# Patient Record
Sex: Male | Born: 1955
Health system: Southern US, Community
[De-identification: ages and names within clinical notes are randomized; demographics above are authoritative.]

## PROBLEM LIST (undated history)

## (undated) DIAGNOSIS — I10 Essential (primary) hypertension: Secondary | ICD-10-CM

## (undated) DIAGNOSIS — M1990 Unspecified osteoarthritis, unspecified site: Secondary | ICD-10-CM

## (undated) DIAGNOSIS — E669 Obesity, unspecified: Secondary | ICD-10-CM

## (undated) DIAGNOSIS — R519 Headache, unspecified: Secondary | ICD-10-CM

## (undated) DIAGNOSIS — D696 Thrombocytopenia, unspecified: Secondary | ICD-10-CM

## (undated) DIAGNOSIS — R011 Cardiac murmur, unspecified: Secondary | ICD-10-CM

## (undated) DIAGNOSIS — F1020 Alcohol dependence, uncomplicated: Secondary | ICD-10-CM

## (undated) DIAGNOSIS — Z720 Tobacco use: Secondary | ICD-10-CM

## (undated) DIAGNOSIS — I2699 Other pulmonary embolism without acute cor pulmonale: Secondary | ICD-10-CM

## (undated) DIAGNOSIS — K2970 Gastritis, unspecified, without bleeding: Secondary | ICD-10-CM

## (undated) DIAGNOSIS — D649 Anemia, unspecified: Secondary | ICD-10-CM

## (undated) DIAGNOSIS — J449 Chronic obstructive pulmonary disease, unspecified: Secondary | ICD-10-CM

## (undated) DIAGNOSIS — K209 Esophagitis, unspecified: Secondary | ICD-10-CM

## (undated) DIAGNOSIS — I209 Angina pectoris, unspecified: Secondary | ICD-10-CM

## (undated) DIAGNOSIS — G4733 Obstructive sleep apnea (adult) (pediatric): Secondary | ICD-10-CM

## (undated) DIAGNOSIS — R51 Headache: Secondary | ICD-10-CM

## (undated) HISTORY — PX: UPPER GI ENDOSCOPY: SHX6162

## (undated) HISTORY — PX: JOINT REPLACEMENT: SHX530

## (undated) HISTORY — DX: Angina pectoris, unspecified: I20.9

## (undated) HISTORY — PX: TONSILLECTOMY: SUR1361

## (undated) HISTORY — DX: Tobacco use: Z72.0

## (undated) HISTORY — DX: Obesity, unspecified: E66.9

---

## 2008-09-10 ENCOUNTER — Ambulatory Visit: Payer: Self-pay | Admitting: Internal Medicine

## 2008-09-10 DIAGNOSIS — G4733 Obstructive sleep apnea (adult) (pediatric): Secondary | ICD-10-CM | POA: Insufficient documentation

## 2008-09-10 DIAGNOSIS — I10 Essential (primary) hypertension: Secondary | ICD-10-CM | POA: Insufficient documentation

## 2008-09-10 DIAGNOSIS — F17201 Nicotine dependence, unspecified, in remission: Secondary | ICD-10-CM | POA: Insufficient documentation

## 2008-09-22 ENCOUNTER — Ambulatory Visit: Payer: Self-pay | Admitting: Internal Medicine

## 2008-10-06 ENCOUNTER — Encounter: Payer: Self-pay | Admitting: Internal Medicine

## 2008-10-06 ENCOUNTER — Ambulatory Visit (HOSPITAL_BASED_OUTPATIENT_CLINIC_OR_DEPARTMENT_OTHER): Admission: RE | Admit: 2008-10-06 | Discharge: 2008-10-06 | Payer: Self-pay | Admitting: Internal Medicine

## 2008-10-09 ENCOUNTER — Ambulatory Visit: Payer: Self-pay | Admitting: Internal Medicine

## 2008-10-14 ENCOUNTER — Ambulatory Visit: Payer: Self-pay | Admitting: Internal Medicine

## 2008-10-20 ENCOUNTER — Encounter: Payer: Self-pay | Admitting: Internal Medicine

## 2008-11-26 ENCOUNTER — Ambulatory Visit: Payer: Self-pay | Admitting: Internal Medicine

## 2008-12-24 HISTORY — PX: COLONOSCOPY W/ BIOPSIES AND POLYPECTOMY: SHX1376

## 2009-03-27 ENCOUNTER — Encounter: Payer: Self-pay | Admitting: Internal Medicine

## 2010-08-23 ENCOUNTER — Ambulatory Visit: Payer: Self-pay | Admitting: Internal Medicine

## 2010-08-29 LAB — CBC & DIFF AND RETIC
Basophils Absolute: 0 10*3/uL (ref 0.0–0.1)
EOS%: 1.6 % (ref 0.0–7.0)
Eosinophils Absolute: 0.1 10*3/uL (ref 0.0–0.5)
HCT: 37.6 % — ABNORMAL LOW (ref 38.4–49.9)
HGB: 12.3 g/dL — ABNORMAL LOW (ref 13.0–17.1)
MCH: 20.7 pg — ABNORMAL LOW (ref 27.2–33.4)
MCV: 63.3 fL — ABNORMAL LOW (ref 79.3–98.0)
MONO%: 7.1 % (ref 0.0–14.0)
NEUT#: 5.8 10*3/uL (ref 1.5–6.5)
NEUT%: 65.5 % (ref 39.0–75.0)
Platelets: 224 10*3/uL (ref 140–400)
RDW: 15 % — ABNORMAL HIGH (ref 11.0–14.6)
Retic Ct Abs: 71.28 10*3/uL (ref 24.10–77.50)

## 2010-08-30 LAB — HEPATITIS PANEL, ACUTE
HCV Ab: NEGATIVE
Hep A IgM: NEGATIVE
Hepatitis B Surface Ag: NEGATIVE

## 2010-08-30 LAB — ANA: Anti Nuclear Antibody(ANA): NEGATIVE

## 2010-08-31 LAB — PROTEIN ELECTROPHORESIS, SERUM
Albumin ELP: 50.7 % — ABNORMAL LOW (ref 55.8–66.1)
Alpha-1-Globulin: 4.7 % (ref 2.9–4.9)
Alpha-2-Globulin: 11 % (ref 7.1–11.8)
Beta 2: 6.2 % (ref 3.2–6.5)
Beta Globulin: 5.9 % (ref 4.7–7.2)
Gamma Globulin: 21.5 % — ABNORMAL HIGH (ref 11.1–18.8)
Total Protein, Serum Electrophoresis: 8.1 g/dL (ref 6.0–8.3)

## 2010-08-31 LAB — FOLATE: Folate: 13.5 ng/mL

## 2010-08-31 LAB — COMPREHENSIVE METABOLIC PANEL
ALT: 23 U/L (ref 0–53)
AST: 23 U/L (ref 0–37)
Albumin: 4.1 g/dL (ref 3.5–5.2)
BUN: 13 mg/dL (ref 6–23)
CO2: 25 mEq/L (ref 19–32)
Calcium: 9.5 mg/dL (ref 8.4–10.5)
Chloride: 99 mEq/L (ref 96–112)
Creatinine, Ser: 0.76 mg/dL (ref 0.40–1.50)
Potassium: 3.5 mEq/L (ref 3.5–5.3)

## 2010-08-31 LAB — VITAMIN B12: Vitamin B-12: 632 pg/mL (ref 211–911)

## 2010-08-31 LAB — IRON AND TIBC: UIBC: 273 ug/dL

## 2010-08-31 LAB — LACTATE DEHYDROGENASE: LDH: 146 U/L (ref 94–250)

## 2010-09-13 LAB — CBC WITH DIFFERENTIAL/PLATELET
BASO%: 0.4 % (ref 0.0–2.0)
EOS%: 0.7 % (ref 0.0–7.0)
MCH: 20.5 pg — ABNORMAL LOW (ref 27.2–33.4)
MCHC: 32.2 g/dL (ref 32.0–36.0)
MONO#: 0.8 10*3/uL (ref 0.1–0.9)
RBC: 5.6 10*6/uL (ref 4.20–5.82)
RDW: 15.1 % — ABNORMAL HIGH (ref 11.0–14.6)
WBC: 7.6 10*3/uL (ref 4.0–10.3)
lymph#: 2 10*3/uL (ref 0.9–3.3)

## 2010-11-30 ENCOUNTER — Ambulatory Visit: Payer: Self-pay | Admitting: Internal Medicine

## 2011-02-07 ENCOUNTER — Encounter (HOSPITAL_COMMUNITY): Payer: Self-pay | Admitting: Radiology

## 2011-02-07 ENCOUNTER — Emergency Department (HOSPITAL_COMMUNITY): Payer: BC Managed Care – PPO

## 2011-02-07 ENCOUNTER — Observation Stay (HOSPITAL_COMMUNITY)
Admission: EM | Admit: 2011-02-07 | Discharge: 2011-02-09 | Disposition: A | Discharge status: Home / Self Care | Payer: BC Managed Care – PPO | Attending: Internal Medicine | Admitting: Internal Medicine

## 2011-02-07 DIAGNOSIS — Z8249 Family history of ischemic heart disease and other diseases of the circulatory system: Secondary | ICD-10-CM | POA: Insufficient documentation

## 2011-02-07 DIAGNOSIS — K2900 Acute gastritis without bleeding: Secondary | ICD-10-CM | POA: Insufficient documentation

## 2011-02-07 DIAGNOSIS — G4733 Obstructive sleep apnea (adult) (pediatric): Secondary | ICD-10-CM | POA: Insufficient documentation

## 2011-02-07 DIAGNOSIS — R0602 Shortness of breath: Secondary | ICD-10-CM | POA: Insufficient documentation

## 2011-02-07 DIAGNOSIS — I10 Essential (primary) hypertension: Secondary | ICD-10-CM | POA: Insufficient documentation

## 2011-02-07 DIAGNOSIS — R0789 Other chest pain: Principal | ICD-10-CM | POA: Insufficient documentation

## 2011-02-07 DIAGNOSIS — F1020 Alcohol dependence, uncomplicated: Secondary | ICD-10-CM | POA: Insufficient documentation

## 2011-02-07 HISTORY — DX: Essential (primary) hypertension: I10

## 2011-02-07 LAB — DIFFERENTIAL
Basophils Absolute: 0.1 10*3/uL (ref 0.0–0.1)
Basophils Relative: 1 % (ref 0–1)
Eosinophils Absolute: 0 10*3/uL (ref 0.0–0.7)
Lymphocytes Relative: 26 % (ref 12–46)
Lymphs Abs: 1.9 10*3/uL (ref 0.7–4.0)
Neutro Abs: 4.7 10*3/uL (ref 1.7–7.7)

## 2011-02-07 LAB — CBC
Hemoglobin: 12.2 g/dL — ABNORMAL LOW (ref 13.0–17.0)
MCH: 20.5 pg — ABNORMAL LOW (ref 26.0–34.0)
MCHC: 32.8 g/dL (ref 30.0–36.0)
RDW: 16.9 % — ABNORMAL HIGH (ref 11.5–15.5)

## 2011-02-07 LAB — POCT CARDIAC MARKERS
CKMB, poc: 1 ng/mL (ref 1.0–8.0)
Myoglobin, poc: >500 ng/mL (ref 12–200)
Troponin i, poc: <0.05 ng/mL (ref 0.00–0.09)

## 2011-02-07 LAB — BASIC METABOLIC PANEL
BUN: 18 mg/dL (ref 6–23)
Calcium: 9.5 mg/dL (ref 8.4–10.5)
Creatinine, Ser: 1.1 mg/dL (ref 0.4–1.5)
GFR calc non Af Amer: >60 mL/min (ref >60)

## 2011-02-07 LAB — RAPID URINE DRUG SCREEN, HOSP PERFORMED
Amphetamines: NOT DETECTED
Benzodiazepines: NOT DETECTED

## 2011-02-07 LAB — ETHANOL: Alcohol, Ethyl (B): 16 mg/dL — ABNORMAL HIGH (ref 0–10)

## 2011-02-08 ENCOUNTER — Observation Stay (HOSPITAL_COMMUNITY): Payer: BC Managed Care – PPO

## 2011-02-08 ENCOUNTER — Encounter (HOSPITAL_COMMUNITY): Payer: Self-pay | Admitting: Radiology

## 2011-02-08 LAB — COMPREHENSIVE METABOLIC PANEL
AST: 49 U/L — ABNORMAL HIGH (ref 0–37)
Albumin: 3.8 g/dL (ref 3.5–5.2)
Chloride: 103 mEq/L (ref 96–112)
Creatinine, Ser: 1.04 mg/dL (ref 0.4–1.5)
GFR calc Af Amer: >60 mL/min (ref >60)
Potassium: 3.6 mEq/L (ref 3.5–5.1)
Total Bilirubin: 1.9 mg/dL — ABNORMAL HIGH (ref 0.3–1.2)
Total Protein: 7.8 g/dL (ref 6.0–8.3)

## 2011-02-08 LAB — CBC
MCH: 21.1 pg — ABNORMAL LOW (ref 26.0–34.0)
Platelets: 136 10*3/uL — ABNORMAL LOW (ref 150–400)
RBC: 5.69 MIL/uL (ref 4.22–5.81)
RDW: 16.5 % — ABNORMAL HIGH (ref 11.5–15.5)
WBC: 7.5 10*3/uL (ref 4.0–10.5)

## 2011-02-08 LAB — LIPID PANEL
HDL: 46 mg/dL (ref >39)
Total CHOL/HDL Ratio: 3 RATIO
Triglycerides: 97 mg/dL (ref <150)

## 2011-02-08 LAB — CARDIAC PANEL(CRET KIN+CKTOT+MB+TROPI)
CK, MB: 2.2 ng/mL (ref 0.3–4.0)
Relative Index: 1 (ref 0.0–2.5)
Total CK: 213 U/L (ref 7–232)
Total CK: 221 U/L (ref 7–232)
Troponin I: <0.01 ng/mL (ref 0.00–0.06)

## 2011-02-08 LAB — TSH: TSH: 3.492 u[IU]/mL (ref 0.350–4.500)

## 2011-02-08 LAB — CK TOTAL AND CKMB (NOT AT ARMC): Total CK: 200 U/L (ref 7–232)

## 2011-02-08 LAB — LIPASE, BLOOD: Lipase: 53 U/L (ref 11–59)

## 2011-02-08 LAB — D-DIMER, QUANTITATIVE: D-Dimer, Quant: 0.88 ug/mL-FEU — ABNORMAL HIGH (ref 0.00–0.48)

## 2011-02-08 MED ORDER — IOHEXOL 300 MG/ML  SOLN
100.0000 mL | Freq: Once | INTRAMUSCULAR | Status: AC | PRN
  Administered 2011-02-08: 100 mL via INTRAVENOUS

## 2011-02-09 LAB — BASIC METABOLIC PANEL
BUN: 21 mg/dL (ref 6–23)
CO2: 26 mEq/L (ref 19–32)
Chloride: 104 mEq/L (ref 96–112)
Glucose, Bld: 118 mg/dL — ABNORMAL HIGH (ref 70–99)
Potassium: 3.3 mEq/L — ABNORMAL LOW (ref 3.5–5.1)
Sodium: 139 mEq/L (ref 135–145)

## 2011-02-17 NOTE — Discharge Summary (Signed)
Jeremy Douglas, Jeremy Douglas              ACCOUNT NO.:  [id number]  MEDICAL RECORD NO.:  [id number]           PATIENT TYPE:  I  LOCATION:  2024                         FACILITY:  [facility]  PHYSICIAN:  Isidor Holts, M.D.  DATE OF BIRTH:  Feb 17, 1956  DATE OF ADMISSION:  02/07/2011 DATE OF DISCHARGE:  02/09/2011                              DISCHARGE SUMMARY   PRIMARY MD:  Dr. Lillette [facility] [place].  DISCHARGE DIAGNOSES: 1. Atypical chest pain, likely due to gastroesophageal reflux disease. 2. Acute gastritis. 3. History of alcohol abuse. 4. Alcohol withdrawal phenomena. 5. Hypertension. 6. Morbid obesity. 7. Smoking history. 8. Obstructive sleep apnea syndrome.  DISCHARGE MEDICATIONS: 1. Ativan 1 mg p.o. t.i.d. for 1 day, then 1 mg p.o. t.i.d. for 1 day,     then 1 mg p.o. daily for 1 day. 2. Protonix 40 mg p.o. b.i.d. for 7 days only. 3. Thiamine 100 mg p.o. daily. 4. Amlodipine 10 mg p.o. daily. 5. Hydrochlorothiazide 25 mg p.o. daily. 6. Lisinopril 40 mg p.o. daily. 7. Metoprolol succinate XL 50 mg p.o. daily.  PROCEDURES: 1. Chest x-ray, February 08, 2011, which showed low lung volumes,     increased interstitial markings, which may facilitate atelectasis     or clinical changes due to smoking. 2. Chest CT angiogram, February 08, 2011, this showed no CT findings     of pulmonary embolism, normal thoracic aorta, no acute pulmonary     findings.  There was diffuse fatty infiltration of the liver.  CONSULTATIONS:  None.  ADMISSION HISTORY:  As in H and P notes of February 07, 2011, dictated by Dr. Midge Minium.  However in brief, this is a 55 year old male, with known history of hypertension, smoking history, morbid obesity, obstructive sleep apnea syndrome, presenting with retrosternal nonradiating chest pain, following repeated episodes of nausea and vomiting.  He was admitted for further evaluation, investigation, and management.  CLINICAL  COURSE: 1. Chest pain.  This had atypical features, retrosternal nonradiating,     occurring after repeated episodes of vomiting.  Cardiac enzymes     were cycled and remained unelevated.  The patient's D-dimer was     mildly elevated at 0.88.  He underwent chest CT angiogram, which was     negative for pulmonary embolism or other acute pathology.     Likely, his symptoms are secondary to GERD.  He was placed on twice-     daily proton pump inhibitor with resolution of symptoms.  2. Acute gastritis.  The patient presented with nausea and vomiting.     He has a background of alcohol excess and likely had alcohol-     induced gastritis.  According to his significant other, who was     present at the time of his initial evaluation, the patient also     drank some mouthwash.  He was managed with twice-daily proton pump     inhibitor.  He had no further episodes of vomiting during the     course of his hospitalization.  Of note, his initial lipase level     was within normal limits at 53.  However, subsequently his lipase     level was 76. The significance of this is uncertain and we doubt that     the patient has acute pancreatitis.  As of     February 09, 2011, he was asymptomatic.  3. Hypertension.  The patient was managed with preadmission     antihypertensive medications and remained reasonably controlled.  4. Alcohol abuse.  Unfortunately, the patient continues to drink.  He     showed mild tremulousness during the course of his hospitalization,     but this responded to Ativan taper.  He has been started on     thiamine supplements and counseled with regards to alcohol     cessation.  5. Smoking history.  The patient was counseled appropriately, supplied     NicoDerm CQ patch.  6. Obstructive sleep apnea syndrome.  The patient remained stable from     this viewpoint, on nocturnal CPAP.  DISPOSITION:  The patient was asymptomatic on February 09, 2011. He was considered  clinically stable for discharge and therefore discharged accordingly.  ACTIVITY:  As tolerated.  DIET:  Heart-healthy.  FOLLOWUP INSTRUCTIONS:  The patient is to follow up routinely with his primary MD, Dr. [facility] [place].     Isidor Holts, M.D.     CO/MEDQ  D:  02/09/2011  T:  02/10/2011  Job:  [id number]  cc:   Darrow Bussing, MD  Electronically Signed by Isidor Holts M.D. on 02/17/2011 01:10:42 PM

## 2011-02-19 NOTE — H&P (Signed)
Jeremy Douglas, INKS              ACCOUNT NO.:  [id number]  MEDICAL RECORD NO.:  [id number]           PATIENT TYPE:  E  LOCATION:  [facility]                         FACILITY:  [facility]  PHYSICIAN:  Eduard Clos, MDDATE OF BIRTH:  July 10, 1956  DATE OF ADMISSION:  02/07/2011 DATE OF DISCHARGE:                             HISTORY & PHYSICAL   PRIMARY CARE PHYSICIAN:  Joyce, [place].  CHIEF COMPLAINT:  Chest pain.  HISTORY OF PRESENTING ILLNESS:  A 55 year old male with known history of hypertension, chronic alcoholism presents with complaint of chest pain which started off last around 10 minutes each time.  It is retrosternal, nonradiating.  Of note, the patient had repeat episodes of nausea and vomiting.  Denies any blood in the vomitus.  Denies any diarrhea or abdominal pain.  The patient denies any shortness of breath.  The patient says he had multiple episodes of chest pain.  He came to the ER and in the ER the patient had ECG and cardiac enzymes were negative. The patient was then admitted for further workup.  The patient denies any dizziness or loss of consciousness.  Denies any focal deficit, headache, or visual symptoms.  PAST MEDICAL HISTORY:  History of hypertension, chronic alcoholism.  PAST SURGICAL HISTORY:  Tonsillectomy.  MEDICATIONS:  The patient states he takes 4 medications for blood pressure control, he does not recall the names.  FAMILY HISTORY:  Significant for coronary artery disease in his father who had CABG done at age 33.  SOCIAL HISTORY:  The patient smokes cigarettes and drinks alcohol every day.  Denies any drug abuse.  He is married __________.  ALLERGIES:  No known drug allergies.  REVIEW OF SYSTEMS:  As per history of present illness, nothing else significant.  PHYSICAL EXAMINATION:  GENERAL:  The patient examined at bedside, not in acute distress. VITAL SIGNS:  Blood pressure is 170/80, pulse 80, temperature 98.7, respirations 24, O2  sat 93%. HEENT:  Anicteric.  No pallor.  No discharge from ears, eyes, or mouth. CHEST:  Bilateral air entry present.  No rhonchi, no crepitation. HEART:  S1 and S2 heard. ABDOMEN:  Soft, nontender.  Bowel sounds heard. CNS:  Alert, awake, and oriented to time, place, and person. EXTREMITIES:  Moves upper and lower extremities 5/5, symmetric. Peripheral pulses good.  No edema.  LABORATORY DATA:  EKG shows normal sinus rhythm with heart rate around 96 beats per minute, poor R-wave progression with nonspecific ST changes.  CBC:  WBC 7.4, hemoglobin 12.2, hematocrit 37.2, platelets 172,000.  Basic metabolic panel:  Sodium 143, potassium 3.6, chloride 105, carbon dioxide 22, glucose 99, BUN 18, creatinine 1.1, calcium 9.5, CK-MB 1.3, troponin-I less than 0.05, myoglobin is 239.  Drug screen negative, alcohol level is 16.  Chest x-ray shows low lung volumes, increasing dyspnea, hemoptysis, __________ atelectasis, chronic changes due to smoking.  ASSESSMENT: 1. Chest pain, rule out acute coronary syndrome. 2. Alcoholism. 3. Hypertension. 4. Obesity.  PLAN: 1. At this time, admit the patient to telemetry. 2. Chest pain.  At this time, the patient is chest pain free.  We will     place the  patient on nitroglycerin p.r.n. and aspirin and also add     Protonix.  We will cycle his cardiac enzymes and obtain 2-D echo. 3. At this time I am going to add LFTs and lipase. 4. Alcoholism.  The patient will be placed on alcohol withdrawal     protocol along with thiamine. 5. I need to verify his home medication and continue if appropriate     for his hypertension. 6. Further recommendation as condition evolves.     Eduard Clos, MD     ANK/MEDQ  D:  02/08/2011  T:  02/08/2011  Job:  [id number]  Electronically Signed by Midge Minium MD on 02/19/2011 04:56:55 PM

## 2011-04-26 ENCOUNTER — Emergency Department (HOSPITAL_COMMUNITY): Payer: BC Managed Care – PPO

## 2011-04-26 ENCOUNTER — Emergency Department (HOSPITAL_COMMUNITY)
Admission: EM | Admit: 2011-04-26 | Discharge: 2011-04-27 | Disposition: A | Discharge status: Other Acute Inpatient Hospital | Payer: BC Managed Care – PPO | Attending: Emergency Medicine | Admitting: Emergency Medicine

## 2011-04-26 DIAGNOSIS — X789XXA Intentional self-harm by unspecified sharp object, initial encounter: Secondary | ICD-10-CM | POA: Insufficient documentation

## 2011-04-26 DIAGNOSIS — F10939 Alcohol use, unspecified with withdrawal, unspecified: Secondary | ICD-10-CM | POA: Insufficient documentation

## 2011-04-26 DIAGNOSIS — F1020 Alcohol dependence, uncomplicated: Secondary | ICD-10-CM | POA: Insufficient documentation

## 2011-04-26 DIAGNOSIS — IMO0002 Reserved for concepts with insufficient information to code with codable children: Secondary | ICD-10-CM | POA: Insufficient documentation

## 2011-04-26 DIAGNOSIS — F10239 Alcohol dependence with withdrawal, unspecified: Secondary | ICD-10-CM | POA: Insufficient documentation

## 2011-04-26 DIAGNOSIS — F1010 Alcohol abuse, uncomplicated: Secondary | ICD-10-CM | POA: Insufficient documentation

## 2011-04-26 LAB — DIFFERENTIAL
Basophils Absolute: 0 10*3/uL (ref 0.0–0.1)
Basophils Relative: 0 % (ref 0–1)
Eosinophils Absolute: 0 K/uL (ref 0.0–0.7)
Eosinophils Relative: 0 % (ref 0–5)
Lymphocytes Relative: 10 % — ABNORMAL LOW (ref 12–46)
Lymphs Abs: 1.1 10*3/uL (ref 0.7–4.0)
Monocytes Absolute: 0.7 K/uL (ref 0.1–1.0)
Monocytes Relative: 6 % (ref 3–12)
Neutro Abs: 9.2 K/uL — ABNORMAL HIGH (ref 1.7–7.7)
Neutrophils Relative %: 84 % — ABNORMAL HIGH (ref 43–77)

## 2011-04-26 LAB — URINALYSIS, ROUTINE W REFLEX MICROSCOPIC
Glucose, UA: NEGATIVE mg/dL
Ketones, ur: >80 mg/dL — AB
Leukocytes, UA: NEGATIVE
Nitrite: NEGATIVE
Protein, ur: 100 mg/dL — AB
Specific Gravity, Urine: 1.025 (ref 1.005–1.030)
Urobilinogen, UA: 1 mg/dL (ref 0.0–1.0)
pH: 5.5 (ref 5.0–8.0)

## 2011-04-26 LAB — CBC
HCT: 31 % — ABNORMAL LOW (ref 39.0–52.0)
Hemoglobin: 10.4 g/dL — ABNORMAL LOW (ref 13.0–17.0)
MCH: 21.4 pg — ABNORMAL LOW (ref 26.0–34.0)
MCHC: 33.5 g/dL (ref 30.0–36.0)
MCV: 63.9 fL — ABNORMAL LOW (ref 78.0–100.0)
Platelets: 77 K/uL — ABNORMAL LOW (ref 150–400)
RBC: 4.85 MIL/uL (ref 4.22–5.81)
RDW: 16.3 % — ABNORMAL HIGH (ref 11.5–15.5)
WBC: 11 10*3/uL — ABNORMAL HIGH (ref 4.0–10.5)

## 2011-04-26 LAB — COMPREHENSIVE METABOLIC PANEL WITH GFR
AST: 104 U/L — ABNORMAL HIGH (ref 0–37)
Albumin: 3.8 g/dL (ref 3.5–5.2)
BUN: 30 mg/dL — ABNORMAL HIGH (ref 6–23)
CO2: 13 meq/L — ABNORMAL LOW (ref 19–32)
Calcium: 8.8 mg/dL (ref 8.4–10.5)
Chloride: 94 meq/L — ABNORMAL LOW (ref 96–112)
Creatinine, Ser: 1.05 mg/dL (ref 0.4–1.5)
GFR calc Af Amer: >60 mL/min (ref >60)
GFR calc non Af Amer: >60 mL/min (ref >60)
Total Bilirubin: 1.3 mg/dL — ABNORMAL HIGH (ref 0.3–1.2)

## 2011-04-26 LAB — APTT: aPTT: 32 seconds (ref 24–37)

## 2011-04-26 LAB — PROTIME-INR
INR: 1.07 (ref 0.00–1.49)
Prothrombin Time: 14.1 seconds (ref 11.6–15.2)

## 2011-04-26 LAB — COMPREHENSIVE METABOLIC PANEL
ALT: 48 U/L (ref 0–53)
Alkaline Phosphatase: 81 U/L (ref 39–117)
Glucose, Bld: 148 mg/dL — ABNORMAL HIGH (ref 70–99)
Potassium: 3.9 mEq/L (ref 3.5–5.1)
Sodium: 134 mEq/L — ABNORMAL LOW (ref 135–145)
Total Protein: 7.8 g/dL (ref 6.0–8.3)

## 2011-04-26 LAB — URINE MICROSCOPIC-ADD ON

## 2011-04-26 LAB — ETHANOL: Alcohol, Ethyl (B): 69 mg/dL — ABNORMAL HIGH (ref 0–10)

## 2011-04-26 LAB — RAPID URINE DRUG SCREEN, HOSP PERFORMED
Amphetamines: NOT DETECTED
Barbiturates: NOT DETECTED
Benzodiazepines: NOT DETECTED
Cocaine: NOT DETECTED
Opiates: NOT DETECTED
Tetrahydrocannabinol: NOT DETECTED

## 2011-04-26 LAB — AMMONIA: Ammonia: 67 umol/L — ABNORMAL HIGH (ref 11–60)

## 2011-04-27 ENCOUNTER — Inpatient Hospital Stay (HOSPITAL_COMMUNITY)
Admission: AD | Admit: 2011-04-27 | Discharge: 2011-04-27 | DRG: 748 | Disposition: A | Discharge status: Other Acute Inpatient Hospital | Payer: BC Managed Care – PPO | Source: Ambulatory Visit | Attending: Psychiatry | Admitting: Psychiatry

## 2011-04-27 ENCOUNTER — Inpatient Hospital Stay (HOSPITAL_COMMUNITY)
Admission: AD | Admit: 2011-04-27 | Discharge: 2011-04-30 | DRG: 174 | Disposition: A | Discharge status: Home / Self Care | Payer: BC Managed Care – PPO | Source: Ambulatory Visit | Attending: Internal Medicine | Admitting: Internal Medicine

## 2011-04-27 ENCOUNTER — Inpatient Hospital Stay (HOSPITAL_COMMUNITY): Payer: BC Managed Care – PPO

## 2011-04-27 ENCOUNTER — Emergency Department (HOSPITAL_COMMUNITY): Admission: EM | Admit: 2011-04-27 | Payer: Self-pay | Admitting: Internal Medicine

## 2011-04-27 DIAGNOSIS — R079 Chest pain, unspecified: Secondary | ICD-10-CM

## 2011-04-27 DIAGNOSIS — F10939 Alcohol use, unspecified with withdrawal, unspecified: Secondary | ICD-10-CM | POA: Diagnosis present

## 2011-04-27 DIAGNOSIS — D62 Acute posthemorrhagic anemia: Secondary | ICD-10-CM | POA: Diagnosis present

## 2011-04-27 DIAGNOSIS — F329 Major depressive disorder, single episode, unspecified: Secondary | ICD-10-CM

## 2011-04-27 DIAGNOSIS — F10239 Alcohol dependence with withdrawal, unspecified: Secondary | ICD-10-CM | POA: Diagnosis present

## 2011-04-27 DIAGNOSIS — I10 Essential (primary) hypertension: Secondary | ICD-10-CM

## 2011-04-27 DIAGNOSIS — E876 Hypokalemia: Secondary | ICD-10-CM | POA: Diagnosis present

## 2011-04-27 DIAGNOSIS — F1020 Alcohol dependence, uncomplicated: Secondary | ICD-10-CM | POA: Diagnosis present

## 2011-04-27 DIAGNOSIS — J209 Acute bronchitis, unspecified: Secondary | ICD-10-CM | POA: Diagnosis present

## 2011-04-27 DIAGNOSIS — K5289 Other specified noninfective gastroenteritis and colitis: Secondary | ICD-10-CM | POA: Diagnosis present

## 2011-04-27 DIAGNOSIS — K922 Gastrointestinal hemorrhage, unspecified: Principal | ICD-10-CM | POA: Diagnosis present

## 2011-04-27 DIAGNOSIS — F3289 Other specified depressive episodes: Secondary | ICD-10-CM

## 2011-04-27 DIAGNOSIS — K2970 Gastritis, unspecified, without bleeding: Secondary | ICD-10-CM | POA: Diagnosis present

## 2011-04-27 DIAGNOSIS — F17200 Nicotine dependence, unspecified, uncomplicated: Secondary | ICD-10-CM | POA: Diagnosis present

## 2011-04-27 DIAGNOSIS — T148XXA Other injury of unspecified body region, initial encounter: Secondary | ICD-10-CM

## 2011-04-27 DIAGNOSIS — R45851 Suicidal ideations: Secondary | ICD-10-CM

## 2011-04-27 DIAGNOSIS — R066 Hiccough: Secondary | ICD-10-CM

## 2011-04-27 DIAGNOSIS — F1910 Other psychoactive substance abuse, uncomplicated: Principal | ICD-10-CM

## 2011-04-27 DIAGNOSIS — K209 Esophagitis, unspecified without bleeding: Secondary | ICD-10-CM | POA: Diagnosis present

## 2011-04-27 DIAGNOSIS — D696 Thrombocytopenia, unspecified: Secondary | ICD-10-CM | POA: Diagnosis present

## 2011-04-27 DIAGNOSIS — K449 Diaphragmatic hernia without obstruction or gangrene: Secondary | ICD-10-CM | POA: Diagnosis present

## 2011-04-27 DIAGNOSIS — G4733 Obstructive sleep apnea (adult) (pediatric): Secondary | ICD-10-CM | POA: Diagnosis present

## 2011-04-27 LAB — PROTIME-INR: INR: 1.08 (ref 0.00–1.49)

## 2011-04-27 LAB — COMPREHENSIVE METABOLIC PANEL
ALT: 43 U/L (ref 0–53)
Albumin: 3.5 g/dL (ref 3.5–5.2)
Alkaline Phosphatase: 71 U/L (ref 39–117)
BUN: 29 mg/dL — ABNORMAL HIGH (ref 6–23)
Chloride: 97 mEq/L (ref 96–112)
Glucose, Bld: 117 mg/dL — ABNORMAL HIGH (ref 70–99)
Potassium: 3.4 mEq/L — ABNORMAL LOW (ref 3.5–5.1)
Total Bilirubin: 1.3 mg/dL — ABNORMAL HIGH (ref 0.3–1.2)

## 2011-04-27 LAB — DIFFERENTIAL
Basophils Relative: 1 % (ref 0–1)
Eosinophils Relative: 1 % (ref 0–5)
Lymphocytes Relative: 17 % (ref 12–46)
Neutrophils Relative %: 73 % (ref 43–77)

## 2011-04-27 LAB — ABO/RH: ABO/RH(D): A POS

## 2011-04-27 LAB — TYPE AND SCREEN: ABO/RH(D): A POS

## 2011-04-27 LAB — CBC
HCT: 25.9 % — ABNORMAL LOW (ref 39.0–52.0)
MCV: 64.3 fL — ABNORMAL LOW (ref 78.0–100.0)
Platelets: 68 10*3/uL — ABNORMAL LOW (ref 150–400)
RBC: 4.03 MIL/uL — ABNORMAL LOW (ref 4.22–5.81)
WBC: 6.6 10*3/uL (ref 4.0–10.5)

## 2011-04-28 ENCOUNTER — Inpatient Hospital Stay (HOSPITAL_COMMUNITY): Payer: BC Managed Care – PPO

## 2011-04-28 LAB — DIFFERENTIAL
Basophils Absolute: 0 10*3/uL (ref 0.0–0.1)
Basophils Relative: 0 % (ref 0–1)
Eosinophils Absolute: 0.1 10*3/uL (ref 0.0–0.7)
Lymphocytes Relative: 19 % (ref 12–46)
Monocytes Absolute: 0.5 10*3/uL (ref 0.1–1.0)
Neutrophils Relative %: 72 % (ref 43–77)

## 2011-04-28 LAB — HEMOGLOBIN AND HEMATOCRIT, BLOOD
HCT: 25.5 % — ABNORMAL LOW (ref 39.0–52.0)
HCT: 26.3 % — ABNORMAL LOW (ref 39.0–52.0)
Hemoglobin: 8.2 g/dL — ABNORMAL LOW (ref 13.0–17.0)
Hemoglobin: 8.4 g/dL — ABNORMAL LOW (ref 13.0–17.0)

## 2011-04-28 LAB — PHOSPHORUS: Phosphorus: 1.7 mg/dL — ABNORMAL LOW (ref 2.3–4.6)

## 2011-04-28 LAB — COMPREHENSIVE METABOLIC PANEL
Alkaline Phosphatase: 65 U/L (ref 39–117)
BUN: 20 mg/dL (ref 6–23)
Calcium: 8.5 mg/dL (ref 8.4–10.5)
Creatinine, Ser: 0.72 mg/dL (ref 0.4–1.5)
Glucose, Bld: 135 mg/dL — ABNORMAL HIGH (ref 70–99)
Potassium: 3.5 mEq/L (ref 3.5–5.1)
Total Protein: 6.5 g/dL (ref 6.0–8.3)

## 2011-04-28 LAB — HEMOGLOBIN A1C: Mean Plasma Glucose: 114 mg/dL (ref <117)

## 2011-04-28 LAB — CARDIAC PANEL(CRET KIN+CKTOT+MB+TROPI)
CK, MB: 3.3 ng/mL (ref 0.3–4.0)
CK, MB: 2.9 ng/mL (ref 0.3–4.0)
Troponin I: <0.3 ng/mL (ref <0.30)

## 2011-04-28 LAB — IRON AND TIBC: Saturation Ratios: 17 % — ABNORMAL LOW (ref 20–55)

## 2011-04-28 LAB — LIPID PANEL
Cholesterol: 137 mg/dL (ref 0–200)
HDL: 47 mg/dL (ref >39)
LDL Cholesterol: 69 mg/dL (ref 0–99)
Total CHOL/HDL Ratio: 2.9 RATIO
Triglycerides: 107 mg/dL (ref <150)

## 2011-04-28 LAB — CBC
Hemoglobin: 8.2 g/dL — ABNORMAL LOW (ref 13.0–17.0)
Platelets: 64 10*3/uL — ABNORMAL LOW (ref 150–400)
RBC: 3.94 MIL/uL — ABNORMAL LOW (ref 4.22–5.81)
WBC: 7 10*3/uL (ref 4.0–10.5)

## 2011-04-28 LAB — MAGNESIUM: Magnesium: 2 mg/dL (ref 1.5–2.5)

## 2011-04-28 LAB — VITAMIN B12: Vitamin B-12: 1187 pg/mL — ABNORMAL HIGH (ref 211–911)

## 2011-04-28 LAB — PROTIME-INR: INR: 1.14 (ref 0.00–1.49)

## 2011-04-29 LAB — COMPREHENSIVE METABOLIC PANEL
ALT: 40 U/L (ref 0–53)
AST: 61 U/L — ABNORMAL HIGH (ref 0–37)
CO2: 25 mEq/L (ref 19–32)
Chloride: 102 mEq/L (ref 96–112)
GFR calc Af Amer: >60 mL/min (ref >60)
GFR calc non Af Amer: >60 mL/min (ref >60)
Potassium: 3.1 mEq/L — ABNORMAL LOW (ref 3.5–5.1)
Sodium: 137 mEq/L (ref 135–145)
Total Bilirubin: 0.8 mg/dL (ref 0.3–1.2)

## 2011-04-29 LAB — CBC
Hemoglobin: 7.4 g/dL — ABNORMAL LOW (ref 13.0–17.0)
RBC: 3.57 MIL/uL — ABNORMAL LOW (ref 4.22–5.81)

## 2011-04-30 ENCOUNTER — Other Ambulatory Visit: Payer: Self-pay | Admitting: Gastroenterology

## 2011-04-30 LAB — BASIC METABOLIC PANEL WITH GFR
BUN: 11 mg/dL (ref 6–23)
CO2: 26 meq/L (ref 19–32)
Calcium: 8.9 mg/dL (ref 8.4–10.5)
Chloride: 102 meq/L (ref 96–112)
Creatinine, Ser: 0.7 mg/dL (ref 0.4–1.5)
GFR calc non Af Amer: >60 mL/min
Glucose, Bld: 107 mg/dL — ABNORMAL HIGH (ref 70–99)
Potassium: 3.6 meq/L (ref 3.5–5.1)
Sodium: 137 meq/L (ref 135–145)

## 2011-04-30 LAB — MAGNESIUM: Magnesium: 2.2 mg/dL (ref 1.5–2.5)

## 2011-04-30 LAB — CBC
MCH: 21.1 pg — ABNORMAL LOW (ref 26.0–34.0)
MCV: 66 fL — ABNORMAL LOW (ref 78.0–100.0)
Platelets: 90 10*3/uL — ABNORMAL LOW (ref 150–400)
RDW: 16.6 % — ABNORMAL HIGH (ref 11.5–15.5)
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
## 2011-05-02 ENCOUNTER — Telehealth: Payer: Self-pay | Admitting: Internal Medicine

## 2011-05-02 DIAGNOSIS — G4730 Sleep apnea, unspecified: Secondary | ICD-10-CM

## 2011-05-02 NOTE — Telephone Encounter (Signed)
Spoke w/ pt and he states his bpap is broken. Pt states it does not work at all and needs a new one ASAP. Pt was advised he would need an order or prescription for this. Pt was last seen 11/2008 and has no pending OV. Next available is 06/12/11 and pt states he can't go that long w/o his bpap. Pt states he has been w/o it for 2 days now and hasn't not sleep anything. Please advise Dr.Young. Thanks  Carver Fila, CMA

## 2011-05-02 NOTE — Telephone Encounter (Signed)
Pt aware order has been sent to [facility] as an urgent request and to let us know if he does not here from DME.

## 2011-05-02 NOTE — Telephone Encounter (Signed)
Per [facility] can contact his DME for his settings so we can order replacement machine and make follow up visit also.

## 2011-05-02 NOTE — Telephone Encounter (Signed)
PT CALLED BACK. SAYS HE WANTS THE ORDER TO GO TO [place]. PT HAS SCHEDULED A F/U FOR 06/12/11 WITH DR. Maple Hudson. Tivis Ringer

## 2011-05-02 NOTE — Telephone Encounter (Signed)
Patient phoned stated that he is returning a call he can be reached at [phone].Jeremy Douglas

## 2011-05-02 NOTE — Telephone Encounter (Signed)
lmomtcb x 1. Order sent to [facility].

## 2011-05-08 NOTE — Procedures (Signed)
NAME:  Jeremy Douglas, REIER              ACCOUNT NO.:  [id number]   MEDICAL RECORD NO.:  [id number]          PATIENT TYPE:  OUT   LOCATION:  SLEEP CENTER                 FACILITY:  [facility]   PHYSICIAN:  Clinton D. Maple Hudson, MD, FCCP, FACPDATE OF BIRTH:  1956-05-24   DATE OF STUDY:  10/06/2008                            NOCTURNAL POLYSOMNOGRAM   REFERRING PHYSICIAN:  Clinton D. Young, MD, FCCP, FACP   INDICATION FOR STUDY:  Hypersomnia with sleep apnea.   EPWORTH SLEEPINESS SCORE:  Epworth sleepiness score 14/24.  BMI 43.4.  Weight 311 pounds.  Height 71 inches.  Neck 17.5 inches.   MEDICATIONS:  Home medication:  None listed.   SLEEP ARCHITECTURE:  Cumulative sleep architecture reported by  technician.  Total sleep time 388 minutes with sleep efficiency 89.7%.  Stage I was 7.9%.  Stage II 70%.  Stage III absent.  REM 32% of total  sleep time.  Sleep latency 8 minutes.  REM latency 91.5 minutes.  Wake  after sleep onset 36 minutes.  Arousal index 53.  No bedtime medication  was taken.   RESPIRATORY DATA:  Split study protocol.  Apnea-hypopnea index (AHI)  109.3 per hour.  A total of 224 events were recorded including 117  obstructive apneas, 80 mixed apneas, 27 hypopneas.  Events were more  common while supine as expected.  REM AHI 14.  CPAP was titrated to 20  CWP, AHI 3.6 per hour.  He chose a medium ResMed Quattro full face mask  with heated humidifier.   OXYGEN DATA:  Moderate-to-loud snoring.  The technician added oxygen at  1 L per minute at 3:03 a.m. to help maintain oxygen saturation above 88%  per protocol.  Oxygen desaturation nadir was 60%.   CARDIAC DATA:  Normal sinus rhythm.   MOVEMENT-PARASOMNIA:  No significant movement disturbance.  No bathroom  trips.   IMPRESSIONS-RECOMMENDATIONS:  1. Severe obstructive sleep apnea/hypopnea syndrome, AHI 109.3 per      hour with events more common while supine.  Moderate-to-loud      snoring with oxygen desaturation to a nadir of  60% on room air.  2. Successful CPAP titration to 20 CWP, AHI 3.6 per hour.  He chose a      medium ResMed Quattro full face mask with heated humidifier.  3. Despite initiation of CPAP therapy, he desaturated significantly      and the technician added oxygen at 1 L per      minute at 3 a.m.  This can be reassessed with overnight oxymetry      while on CPAP and supplemental oxygen at home as appropriate.      Clinton D. Maple Hudson, MD, [facility], FACP  Diplomate, Biomedical engineer of Sleep Medicine  Electronically Signed     CDY/MEDQ  D:  10/09/2008 18:20:42  T:  10/10/2008 02:35:47  Job:  [id number]
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
## 2011-05-11 NOTE — H&P (Signed)
NAME:  Jeremy Douglas, Jeremy Douglas              ACCOUNT NO.:  [id number]  MEDICAL RECORD NO.:  [id number]           PATIENT TYPE:  LOCATION:                                 FACILITY:  PHYSICIAN:  Kathlen Mody, MD       DATE OF BIRTH:  02-12-1956  DATE OF ADMISSION: DATE OF DISCHARGE:                             HISTORY & PHYSICAL   PRIMARY CARE PHYSICIAN:  [name], MD, at [facility].  GASTROENTEROLOGIST:  [facility] gastroenterologist.  CHIEF COMPLAINT:  Epigastric pain.  HISTORY OF PRESENT ILLNESS:  This is a 55 year old gentleman with a history of hypertension and alcohol abuse, came in yesterday after taking about a pint of alcohol.  He was complaining of epigastric pain and right-sided chest pain, associated with some hiccups, bloating. Denies any fever, shortness of breath, palpitations, syncope.  Denies any nausea, vomiting.  Right-sided chest pain and epigastric pain, nonradiating, sharp, constant, not associated with any nausea or vomiting, started after taking alcohol.  The patient yesterday was admitted to [facility] for alcohol detox, but later the patient was brought back to the ED as his hemoglobin dropped about 2 g.  His hemoglobin yesterday was 10.4 and hemoglobin today was 8.4 and his stool for occult blood was positive.  The patient does not report any frank blood per rectum.  He denies any hematemesis, but reports black stools for the last 2 days.  The patient had a colonoscopy last year in September by [facility] and was told he had 3 polyps, all of them benign; no hemorrhoids as per the patient.  The patient denies any other complaints of tingling or numbness, weakness, focal deficits, headaches, or blurry vision.  REVIEW OF SYSTEMS:  See HPI, otherwise negative.  PAST MEDICAL HISTORY: 1. Hypertension. 2. Alcohol abuse. 3. Obesity.  PAST SURGICAL HISTORY:  Colonoscopy in September 2011 and tonsillectomy as a child.  SOCIAL HISTORY:  Takes alcohol  every day, smokes cigarettes.  Denies any IV drug abuse.  FAMILY HISTORY:  Coronary artery disease in the father.  HOME MEDICATIONS: 1. Metoprolol 50 mg 1 tablet daily. 2. Lisinopril 40 mg daily. 3. Hydrochlorothiazide 25 mg daily. 4. Amlodipine 10 mg daily. 5. Multivitamin B12 tablet. 6. Vitamin D tablet daily.  ALLERGIES:  No known drug allergies.  PHYSICAL EXAMINATION:  VITAL SIGNS:  The patient's vitals include temperature of 98.7, pulse of 90 per minute, respiratory rate 19 per minute, blood pressure 135/87, saturating 96% on room air. GENERAL:  He is alert, afebrile, comfortable, in no acute distress. HEENT:  Pupils reacting to light and accommodation.  Moist mucous membranes. NECK:  No JVD. CARDIOVASCULAR:  S1 and S2 heard.  Regular rate and rhythm. RESPIRATORY:  Chest clear to auscultation bilaterally.  No wheezing or rhonchi. ABDOMEN:  Obese, soft, tender in the epigastric area.  Bowel sounds are heard. EXTREMITIES:  No pedal edema.  PERTINENT LABORATORY DATA:  The patient had ammonia level which was 43. Comprehensive metabolic panel showed sodium of 135, potassium of 3.4, chloride of 97, bicarbonate of 23, glucose of 117, BUN of 29, creatinine 0.86.  Bilirubin of 1.3, AST of 84.  CBC shows a WBC count of 6.6, hemoglobin of 8.4, hematocrit of 25.9, MCV of 64.3, platelets of 68. Stool occult blood positive.  INR was 1.08.  RADIOLOGICAL DATA:  The patient had an x-ray on Apr 26, 2011, which showed mild basilar volume loss, no definite active process.  Had an x- ray of the sternum which was negative.  ASSESSMENT AND PLAN:  This is a 55 year old gentleman with past medical history of hypertension and alcohol abuse.  Initially, admitted for [facility] for the alcohol detox, was brought back to [facility] ED for a drop in hemoglobin of 2 g, melena since 2 days, stool for occult blood positive, and epigastric pain since 2 days. 1. Gastrointestinal bleed, most  likely upper gastrointestinal bleed.     We will put the patient on some clear liquid diet and put the     patient on n.p.o. after midnight.  We will start the patient on     Protonix 40 mg twice a day.  We will start the patient on Carafate     4 times a day as needed. 2. Epigastric pain, worsening on food intake, could be a peptic ulcer     disease versus gastritis.  We will continue the patient on Protonix     and we will get a gastroenterology consult in the morning for     possible upper EGD. 3. Right-sided chest pain.  The patient had some self-inflicting chest     wounds.  The patient has a family history of coronary artery     disease.  We will also try to rule out acute coronary syndrome.  We     will get cardiac enzymes q.6 h. x3.  We will get a 2-D     echocardiogram, admit the patient to Telemetry, and observe the     patient for 24-48 hours. 4. Hypertension.  The patient's blood pressure is controlled.  We will     hold the blood pressure medications for the next 24 hours which can     be re-started later on when the hemoglobin becomes stable. 5. Alcohol abuse.  The patient reports anxiety and jittery.  As per     the patient, we will put the patient on CIWA protocol, Ativan     p.r.n. We will start the patient on thiamine and folic acid. 1. Anemia, microcytic.  Most likely secondary to gastrointestinal     bleed.  We will get an anemia profile.  We will get stool for     occult blood 2 more times. 2. Thrombocytopenia, could be secondary to liver disease from alcohol-     induced liver disease.  We will hold Lovenox.  We will put the     patient on SCDs for deep vein thrombosis prophylaxis.  We will get     a peripheral     smear to see if there is any clumping of platelets. 3. Deep vein thrombosis prophylaxis, SCDs.  Gastrointestinal     prophylaxis, on Protonix. 4. The patient is full code.          ______________________________ Kathlen Mody, MD     VA/MEDQ   D:  04/27/2011  T:  04/27/2011  Job:  [id number]  Electronically Signed by Kathlen Mody MD on 05/11/2011 09:01:44 PM

## 2011-05-23 NOTE — Op Note (Signed)
NAMESTEFEN, Jeremy Douglas              ACCOUNT NO.:  [id number]  MEDICAL RECORD NO.:  [id number]           PATIENT TYPE:  I  LOCATION:  1406                         FACILITY:  [facility]  PHYSICIAN:  Petra Kuba, M.D.    DATE OF BIRTH:  03/09/1956  DATE OF PROCEDURE:  04/30/2011 DATE OF DISCHARGE:                              OPERATIVE REPORT   PROCEDURE:  Esophagogastroduodenoscopy with biopsy.  INDICATIONS:  The patient with some melena, decreased hemoglobin, upper tract symptoms.  Consent was signed after risks, benefits, methods, and options thoroughly discussed prior to sedation multiple times during the hospital stay.  MEDICINES USED:  Fentanyl 75 mcg, Versed 7.5 mg, Benadryl 25 mg.  PROCEDURE IN DETAIL:  The video endoscope was inserted by direct vision. The proximal esophagus was normal.  Beginning in the mid esophagus was moderate linear esophagitis and a small hiatal hernia.  A few distal esophageal biopsies were obtained at the end of the procedure.  Scope passed into the stomach and advanced to the antrum where some minimal antritis was seen and advanced into the duodenal bulb where probably moderate bulbitis was seen, but no frank ulcerations and around the C- loop to a normal second portion of the duodenum.  No blood was seen on insertion or distally.  The scope was withdrawn back to the bulb, which confirmed the above findings.  The scope was withdrawn back to the stomach and retroflexed.  Cardia, fundus, angularis, lesser and greater curve were evaluated on retroflex and then straight visualization and other than some mild patchy gastritis and antritis mentioned above, no other abnormalities were seen and no signs of bleeding.  The scope was reinserted into the bulb and around the C-loop one more time to confirm the above findings.  The scope was withdrawn back to the stomach.  Two biopsies of the antrum and two of the proximal stomach were obtained to rule out  Helicobacter.  Air was suctioned.  The scope was slowly withdrawn.  The esophagus was evaluated, which confirmed the above findings.  The distal esophageal biopsies were obtained at this time. The most proximal part of the esophagus was normal, but the esophagitis was moderate and at least partially into the mid esophagus.  The scope was removed.  The patient tolerated the procedure well and there was no obvious immediate complication.  ENDOSCOPIC DIAGNOSES: 1. Small hiatal hernia. 2. Moderate linear distal and mid esophagitis status post biopsy. 3. Mild gastritis and antritis status post biopsy. 4. Moderate bulbitis. 5. Otherwise normal esophagogastroduodenoscopy without signs of     bleeding.  PLAN:  Advance diet.  Okay to go home from my standpoint.  Continue pump inhibitors.  No aspirin and nonsteroidals.  Agree with alcohol rehabilitation and follow up p.r.n. or in one month to recheck symptoms and make sure no further workup plans are needed from a GI standpoint. We will call with biopsy results in one week.          ______________________________ Petra Kuba, M.D.     MEM/MEDQ  D:  04/30/2011  T:  04/30/2011  Job:  [id number]  cc:   Vernia Buff  E. Domanique Luckett, M.D. Fax: [phone]  Carola J. Gerri Spore, M.D. Fax: [phone]  Electronically Signed by Vida Rigger M.D. on 05/23/2011 01:28:43 PM

## 2011-05-23 NOTE — Consult Note (Signed)
  Jeremy Douglas, Jeremy Douglas              ACCOUNT NO.:  [id number]  MEDICAL RECORD NO.:  [id number]           PATIENT TYPE:  I  LOCATION:  1406                         [facility]AC[facility]  PHYSICIAN:  Petra Kuba, M.D.    DATE OF BIRTH:  1956-04-08  DATE OF CONSULTATION:  04/28/2011 DATE OF DISCHARGE:                                CONSULTATION   HISTORY:  The patient initially admitted to psych for detox and alcohol problems.  He did have some melena and some upper tract symptoms and a slight decrease in his baseline anemia and was referred to the hospital and we are consulted for further workup and plans.  He did have some black stools for a day or two but they seemed to be lighter now but continues to have atypical chest pain and hiccups despite some IV Protonix.  He did have an uneventful colonoscopy in the fall when some hyperplastic polyps were found and he did not have any problems from that but he has never had any other upper tract problems.  He denies any aspirin or nonsteroidals at home.  PAST MEDICAL HISTORY:  His past medical history is pertinent for hypertension, alcohol abuse, and being overweight.  SURGERIES:  Negative.  SOCIAL HISTORY:  Does smoke and drink but denies any drug use.  FAMILY HISTORY:  Pertinent for mom with chronic GI issues, he does not know what from.  ALLERGIES:  None.  CURRENT MEDICINES:  Include vitamins, Ativan, Lopressor, Protonix, and Zofran.  At home, he also takes lisinopril HCTZ, amlodipine.  REVIEW OF SYSTEMS:  Negative except above.  PHYSICAL EXAMINATION:  GENERAL:  No acute distress.  The patient sitting up without problems. LUNGS:  Clear. HEART:  Regular rate and rhythm. ABDOMEN:  Soft, nontender.  No chest wall tenderness.  Anemia panel is pertinent for a ferritin of 1183, percent sat of 17, iron low normal, TIBC is low normal.  Complete metabolic profile pertinent for SGOT of 79, otherwise albumin of 3.3, BUN 20,  creatinine 0.72, otherwise normal.  PT was normal.  Ammonia was 67 on admission. BUN on admission was 30.  His SGOT was slightly higher as mentioned above.  Hemoglobin on admission 10.4 with chronic MCV of 63, white count 11, platelets 77.  Today, his hemoglobin has dropped to 8.2, platelets dropped to 6.4.  White count 7.0.  ASSESSMENT: 1. Multiple medical problems. 2. Chronic anemia, followed by Dr. Arbutus Ped. 3. Subacute gastrointestinal bleeding and upper tract symptoms.  PLAN:  Will try some antacids in addition to his IV Protonix.  Will proceed with an endoscopy p.r.n. signs of bleeding or in a few days when there is less of risk of withdrawal.  I have discussed the endoscopy risks, benefits and methods with the patient.  Will follow with you. Continue clear liquids for now.          ______________________________ Petra Kuba, M.D.     MEM/MEDQ  D:  04/28/2011  T:  04/28/2011  Job:  [id number]  cc:   Dr. Verlon Au  Electronically Signed by Vida Rigger M.D. on 05/23/2011 01:28:40 PM

## 2011-06-01 ENCOUNTER — Ambulatory Visit
Admission: RE | Admit: 2011-06-01 | Discharge: 2011-06-01 | Disposition: A | Discharge status: Home / Self Care | Payer: BC Managed Care – PPO | Source: Ambulatory Visit | Attending: Family Medicine | Admitting: Family Medicine

## 2011-06-01 ENCOUNTER — Other Ambulatory Visit: Payer: Self-pay | Admitting: Family Medicine

## 2011-06-01 DIAGNOSIS — M25569 Pain in unspecified knee: Secondary | ICD-10-CM

## 2011-06-12 ENCOUNTER — Encounter: Payer: Self-pay | Admitting: Internal Medicine

## 2011-06-12 ENCOUNTER — Ambulatory Visit (INDEPENDENT_AMBULATORY_CARE_PROVIDER_SITE_OTHER): Payer: BC Managed Care – PPO | Admitting: Internal Medicine

## 2011-06-12 VITALS — BP 150/72 | HR 58 | Ht 71.0 in | Wt 279.2 lb

## 2011-06-12 DIAGNOSIS — G4730 Sleep apnea, unspecified: Secondary | ICD-10-CM

## 2011-06-12 DIAGNOSIS — F17200 Nicotine dependence, unspecified, uncomplicated: Secondary | ICD-10-CM

## 2011-06-12 NOTE — Progress Notes (Signed)
  Subjective:    Patient ID: Jeremy Douglas, male    DOB: 1956/01/06, 55 y.o.   MRN: [id number]  HPI 06/12/11- 73 yo smoker followed for OSA, complicated by HBP.  Last here 11/26/08-  Note reviewed. We had changed his machine to BiPap 20/15. His old machine shorted out. Couldn't sleep at all without it. Now using a temporary loaner CPAP machine at 15 which isn't as good. Likes a "rim" gasket that improves seal. He discussed this with his orthopedic office pending knee surgery next week. We discussed CPAP vs BiPAP and also surgery issues.   Review of Systems Constitutional:   No weight loss, night sweats,  Fevers, chills, fatigue, lassitude. HEENT:   No headaches,  Difficulty swallowing,  Tooth/dental problems,  Sore throat,                No sneezing, itching, ear ache, nasal congestion, post nasal drip,   CV:  No chest pain, orthopnea, PND, swelling in lower extremities, anasarca, dizziness, palpitations  GI  No heartburn, indigestion, abdominal pain, nausea, vomiting, diarrhea, change in bowel habits, loss of appetite  Resp: No shortness of breath with exertion or at rest.  No excess mucus, no productive cough,  No non-productive cough,  No coughing up of blood.  No change in color of mucus.  No wheezing.   Skin: no rash or lesions.  GU: no dysuria, change in color of urine, no urgency or frequency.  No flank pain.  MS:  No joint pain or swelling.  No decreased range of motion.  No back pain.  Psych:  No change in mood or affect. No depression or anxiety.  No memory loss.      Objective:   Physical Exam General- Alert, Oriented, Affect-appropriate, Distress- none acute  overweight  Skin- rash-none, lesions- none, excoriation- none  Lymphadenopathy- none  Head- atraumatic  Eyes- Gross vision intact, PERRLA, conjunctivae clear secretions  Ears- Hearing, canals, Tm- normal  Nose- Clear, No-Septal dev, mucus, polyps, erosion, perforation   Throat- Mallampati IV , mucosa  clear , drainage- none, tonsils- atrophic  Neck- flexible , trachea midline, no stridor , thyroid nl, carotid no bruit  Chest - symmetrical excursion , unlabored     Heart/CV- RRR , no murmur , no gallop  , no rub, nl s1 s2                     - JVD- none , edema- none, stasis changes- none, varices- none     Lung- clear to P&A, wheeze- none, cough- none , dullness-none, rub- none     Chest wall-   Abd- tender-no, distended-no, bowel sounds-present, HSM- no  Br/ Gen/ Rectal- Not done, not indicated  Extrem- cyanosis- none, clubbing, none, atrophy- none, strength- nl  Neuro- grossly intact to observation         Assessment & Plan:

## 2011-06-12 NOTE — Patient Instructions (Signed)
Order- [facility]- [employer]- replacement for broken BiPAP machine 20/15 , heated humidifier    Dx OSA  Please call as needed

## 2011-06-12 NOTE — Assessment & Plan Note (Signed)
Very good compliance and control. We will get a replacement BiPAP machine. Current pressure 20/15 is appropriate. [employer]

## 2011-06-14 ENCOUNTER — Encounter: Payer: Self-pay | Admitting: Internal Medicine

## 2011-06-14 NOTE — Assessment & Plan Note (Signed)
Smoking cessation importance explained and support group program recommended

## 2011-06-18 ENCOUNTER — Encounter (HOSPITAL_BASED_OUTPATIENT_CLINIC_OR_DEPARTMENT_OTHER)
Admission: RE | Admit: 2011-06-18 | Discharge: 2011-06-18 | Disposition: A | Discharge status: Home / Self Care | Payer: BC Managed Care – PPO | Source: Ambulatory Visit | Attending: Orthopedic Surgery | Admitting: Orthopedic Surgery

## 2011-06-18 LAB — BASIC METABOLIC PANEL
BUN: 17 mg/dL (ref 6–23)
Creatinine, Ser: 0.79 mg/dL (ref 0.50–1.35)
GFR calc Af Amer: >60 mL/min (ref >60)
GFR calc non Af Amer: >60 mL/min (ref >60)

## 2011-06-20 ENCOUNTER — Ambulatory Visit (HOSPITAL_BASED_OUTPATIENT_CLINIC_OR_DEPARTMENT_OTHER)
Admission: RE | Admit: 2011-06-20 | Discharge: 2011-06-20 | Disposition: A | Discharge status: Home / Self Care | Payer: BC Managed Care – PPO | Source: Ambulatory Visit | Attending: Orthopedic Surgery | Admitting: Orthopedic Surgery

## 2011-06-20 DIAGNOSIS — M2240 Chondromalacia patellae, unspecified knee: Secondary | ICD-10-CM | POA: Insufficient documentation

## 2011-06-20 DIAGNOSIS — M23329 Other meniscus derangements, posterior horn of medial meniscus, unspecified knee: Secondary | ICD-10-CM | POA: Insufficient documentation

## 2011-06-20 DIAGNOSIS — F17200 Nicotine dependence, unspecified, uncomplicated: Secondary | ICD-10-CM | POA: Insufficient documentation

## 2011-06-20 DIAGNOSIS — Z01812 Encounter for preprocedural laboratory examination: Secondary | ICD-10-CM | POA: Insufficient documentation

## 2011-06-20 DIAGNOSIS — E669 Obesity, unspecified: Secondary | ICD-10-CM | POA: Insufficient documentation

## 2011-06-20 DIAGNOSIS — I10 Essential (primary) hypertension: Secondary | ICD-10-CM | POA: Insufficient documentation

## 2011-06-20 HISTORY — PX: KNEE ARTHROSCOPY: SHX127

## 2011-06-20 LAB — POCT HEMOGLOBIN-HEMACUE: Hemoglobin: 12.2 g/dL — ABNORMAL LOW (ref 13.0–17.0)

## 2011-12-29 ENCOUNTER — Encounter (HOSPITAL_COMMUNITY): Payer: Self-pay | Admitting: *Deleted

## 2011-12-29 ENCOUNTER — Emergency Department (HOSPITAL_COMMUNITY): Payer: BC Managed Care – PPO

## 2011-12-29 ENCOUNTER — Emergency Department (HOSPITAL_COMMUNITY)
Admission: EM | Admit: 2011-12-29 | Discharge: 2011-12-29 | Disposition: A | Discharge status: Home / Self Care | Payer: BC Managed Care – PPO | Attending: Emergency Medicine | Admitting: Emergency Medicine

## 2011-12-29 DIAGNOSIS — M47812 Spondylosis without myelopathy or radiculopathy, cervical region: Secondary | ICD-10-CM | POA: Insufficient documentation

## 2011-12-29 DIAGNOSIS — M25511 Pain in right shoulder: Secondary | ICD-10-CM

## 2011-12-29 DIAGNOSIS — M25519 Pain in unspecified shoulder: Secondary | ICD-10-CM | POA: Insufficient documentation

## 2011-12-29 DIAGNOSIS — M542 Cervicalgia: Secondary | ICD-10-CM | POA: Insufficient documentation

## 2011-12-29 DIAGNOSIS — I10 Essential (primary) hypertension: Secondary | ICD-10-CM | POA: Insufficient documentation

## 2011-12-29 MED ORDER — METHYLPREDNISOLONE SODIUM SUCC 125 MG IJ SOLR
125.0000 mg | Freq: Once | INTRAMUSCULAR | Status: AC
  Administered 2011-12-29: 125 mg via INTRAVENOUS
  Filled 2011-12-29: qty 2

## 2011-12-29 MED ORDER — OXYCODONE-ACETAMINOPHEN 7.5-325 MG PO TABS: 1.0000 | ORAL_TABLET | ORAL | Status: AC | PRN

## 2011-12-29 MED ORDER — SODIUM CHLORIDE 0.9 % IV SOLN
Freq: Once | INTRAVENOUS | Status: AC
  Administered 2011-12-29: 1000 mL via INTRAVENOUS

## 2011-12-29 MED ORDER — HYDROMORPHONE HCL PF 1 MG/ML IJ SOLN
1.0000 mg | Freq: Once | INTRAMUSCULAR | Status: AC
  Administered 2011-12-29: 1 mg via INTRAVENOUS
  Filled 2011-12-29: qty 1

## 2011-12-29 MED ORDER — PREDNISONE 20 MG PO TABS: ORAL_TABLET | ORAL | Status: AC

## 2011-12-29 MED ORDER — DIAZEPAM 5 MG/ML IJ SOLN
5.0000 mg | Freq: Once | INTRAMUSCULAR | Status: AC
  Administered 2011-12-29: 5 mg via INTRAVENOUS
  Filled 2011-12-29: qty 2

## 2011-12-29 MED ORDER — ONDANSETRON HCL 4 MG/2ML IJ SOLN
4.0000 mg | Freq: Once | INTRAMUSCULAR | Status: AC
  Administered 2011-12-29: 4 mg via INTRAVENOUS
  Filled 2011-12-29: qty 2

## 2011-12-29 MED ORDER — DIAZEPAM 5 MG PO TABS: 5.0000 mg | ORAL_TABLET | Freq: Four times a day (QID) | ORAL | Status: AC | PRN

## 2011-12-29 NOTE — ED Notes (Signed)
Pt to MRI

## 2011-12-29 NOTE — ED Provider Notes (Signed)
History     CSN: [id number]  Arrival date & time 12/29/11  9147   First MD Initiated Contact with Patient 12/29/11 1006      Chief Complaint  Patient presents with  . Neck Pain    pinched nerve in neck    (Consider location/radiation/quality/duration/timing/severity/associated sxs/prior treatment) Patient is a 56 y.o. male presenting with neck pain. The history is provided by the patient.  Neck Pain  This is a new problem. The current episode started more than 1 week ago. The problem occurs constantly. The problem has been gradually worsening. Associated with: No known injury or previous history of similar symptoms. There has been no fever. The pain is present in the right side. The quality of the pain is described as stabbing, shooting, aching and burning. The pain radiates to the right shoulder and right arm. The pain is at a severity of 10/10. Exacerbated by: Nothing improves the pain, nothing makes pain worse. He has tried NSAIDs, ice, analgesics and muscle relaxants for the symptoms. The treatment provided no relief.    Past Medical History  Diagnosis Date  . Hypertension   . Sleep apnea     pt states he is on bipap at night for sleep apnea    Past Surgical History  Procedure Date  . Tonsillectomy     as a child    History reviewed. No pertinent family history.  History  Substance Use Topics  . Smoking status: Current Everyday Smoker -- 0.8 packs/day    Types: Cigarettes  . Smokeless tobacco: Not on file  . Alcohol Use: No     quit drinking       Review of Systems  Constitutional: Negative for fever and chills.  HENT: Positive for neck pain.   Respiratory: Negative.   Cardiovascular: Negative.   Gastrointestinal: Negative.   Musculoskeletal: Negative for back pain.       See HPI  Skin: Negative.   Neurological: Negative.     Allergies  Review of patient's allergies indicates no known allergies.  Home Medications   Current Outpatient Rx  Name Route  Sig Dispense Refill  . AMLODIPINE BESYLATE 10 MG PO TABS Oral Take 10 mg by mouth daily.      Marland Kitchen CARISOPRODOL 250 MG PO TABS Oral Take 250 mg by mouth 4 (four) times daily.      Marland Kitchen VITAMIN B12 PO Oral Take 1 tablet by mouth daily.      . OMEGA-3 FATTY ACIDS 1000 MG PO CAPS Oral Take 1 g by mouth daily.      Marland Kitchen HYDROCHLOROTHIAZIDE 25 MG PO TABS Oral Take 1 tablet by mouth Daily.    Marland Kitchen LISINOPRIL 40 MG PO TABS Oral Take 1 tablet by mouth Daily.    Marland Kitchen METOPROLOL SUCCINATE ER 50 MG PO TB24 Oral Take 1 tablet by mouth Daily.    . ADULT MULTIVITAMIN W/MINERALS CH Oral Take 1 tablet by mouth daily.      Marland Kitchen VITAMIN B-6 PO Oral Take 1 tablet by mouth daily.      Marland Kitchen VITAMIN B-1 PO Oral Take 1 tablet by mouth daily.        BP 146/69  Pulse 68  Temp(Src) 98 F (36.7 C) (Oral)  Resp 20  Ht 5\' 11"  (1.803 m)  Wt 258 lb (117.028 kg)  BMI 35.98 kg/m2  SpO2 95%  Physical Exam  Constitutional: He is oriented to person, place, and time. He appears well-developed and well-nourished.  Neck: Normal range of  motion.       No cervical or paracervical tenderness or swelling. FROM of neck.  Pulmonary/Chest: Effort normal.  Musculoskeletal: Normal range of motion. He exhibits no edema.       Point tenderness in right central deltoid. No swelling, discoloration. FROM. Distal pulses 2+ without neurosensory deficits. No muscular atrophy or wasting of right upper arm.   Neurological: He is alert and oriented to person, place, and time.  Skin: Skin is warm and dry.  Psychiatric: He has a normal mood and affect.    ED Course  Procedures (including critical care time)  Labs Reviewed - No data to display No results found.   No diagnosis found.    MDM  MRI essentially normal without explanation for patient's right sided symptoms. Re-exam.: continues to have normal strength, point tenderness to deltoid right without swelling, pain greatest with abduction of right upper extremity. Will discharge home to follow up  with Dr. Luiz Blare, on steroids and continue pain control.        Rodena Medin, PA 12/29/11 1319

## 2011-12-29 NOTE — ED Notes (Signed)
Pt reports pain in neck started 12/17/11 and pt went to urgent care on 12/21/11. Urgent care took xrays and said between C4 and C5 there is arthritis. Pain neck shoulder and right arm 9/10 constant aching and sharp pain.

## 2011-12-30 NOTE — ED Provider Notes (Signed)
Medical screening examination/treatment/procedure(s) were performed by non-physician practitioner and as supervising physician I was immediately available for consultation/collaboration.   Axcel Horsch L Esdras Delair, MD 12/30/11 0741 

## 2012-03-13 IMAGING — CR DG ABDOMEN ACUTE W/ 1V CHEST
4 series · 4 of 4 positions shown · non-contrast
Comparison: None

CLINICAL DATA: Gastrointestinal bleed.  Abdominal pain.

ACUTE ABDOMEN SERIES (ABDOMEN 2 VIEW & CHEST 1 VIEW)

[w chest pa]
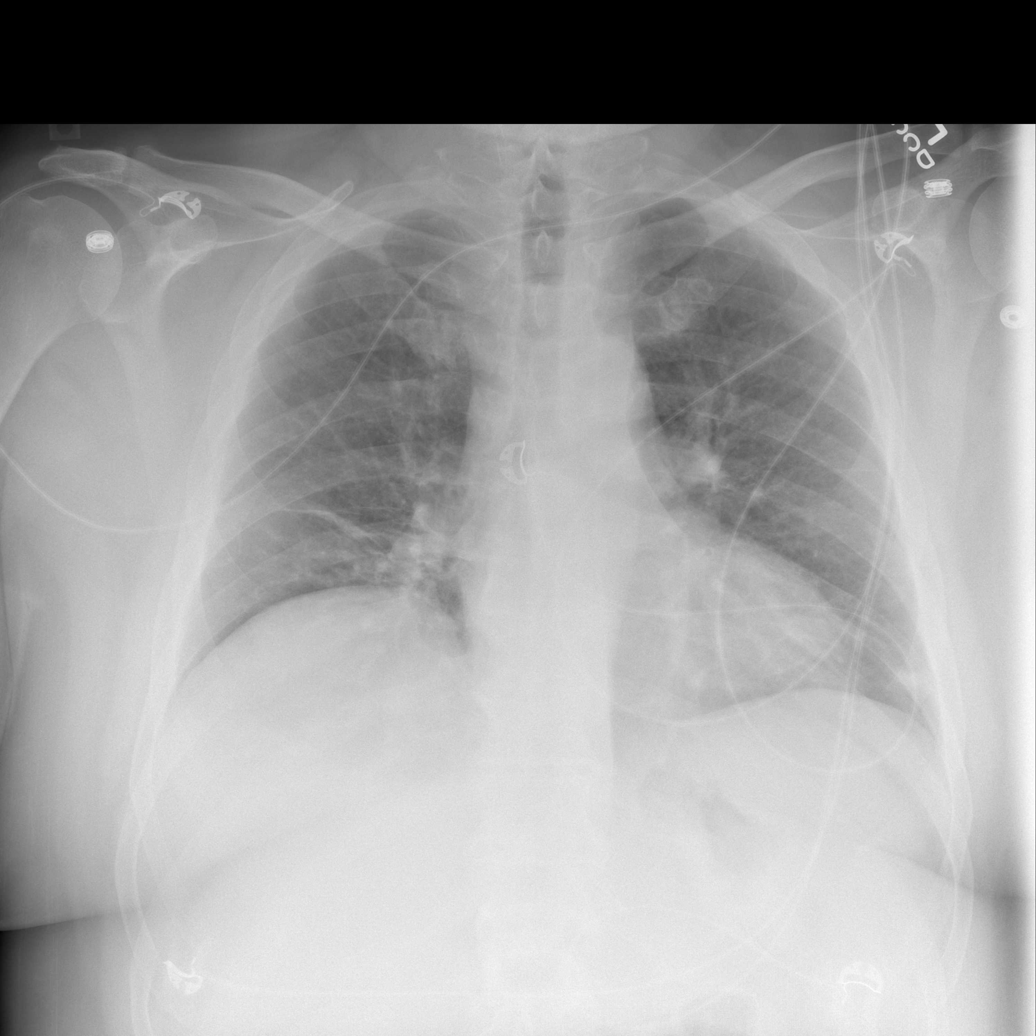

[w abdomen upright *]
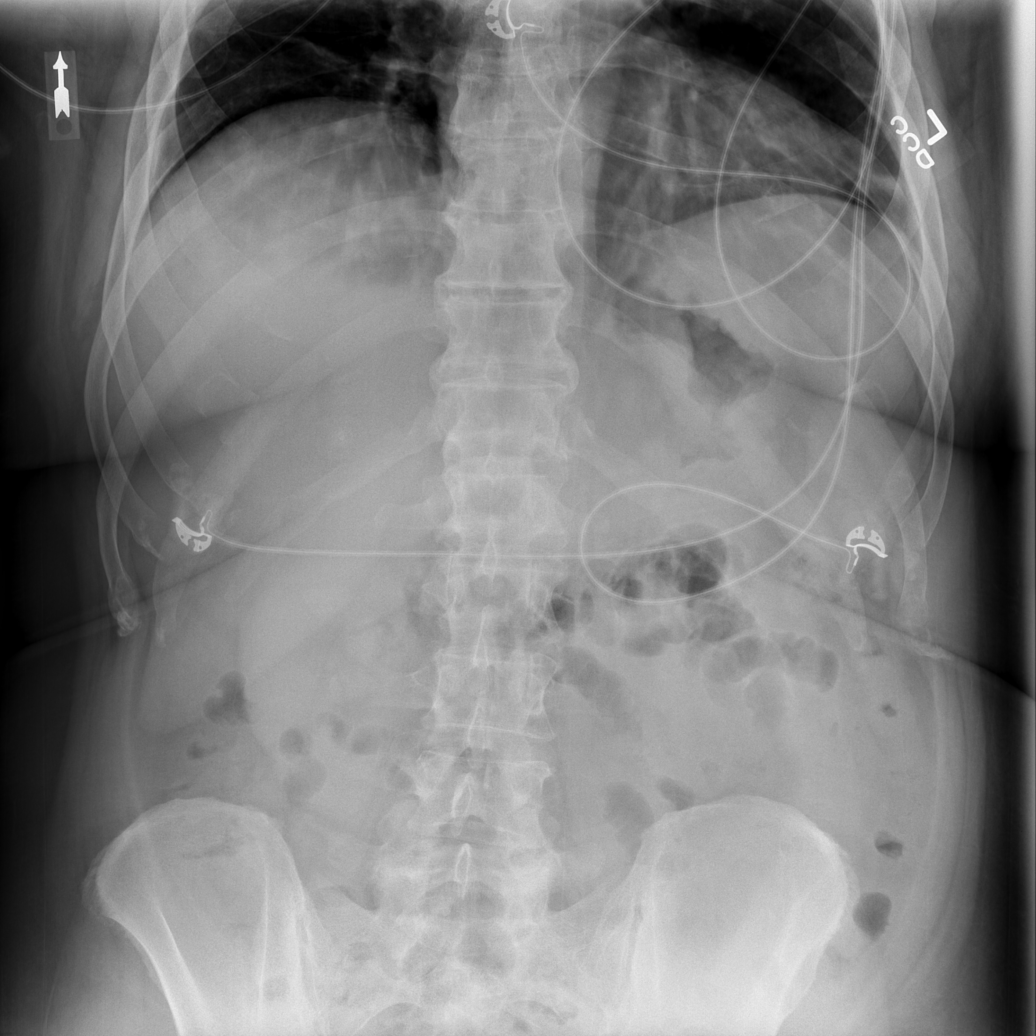

[t abdomen supine (1 of 2)]
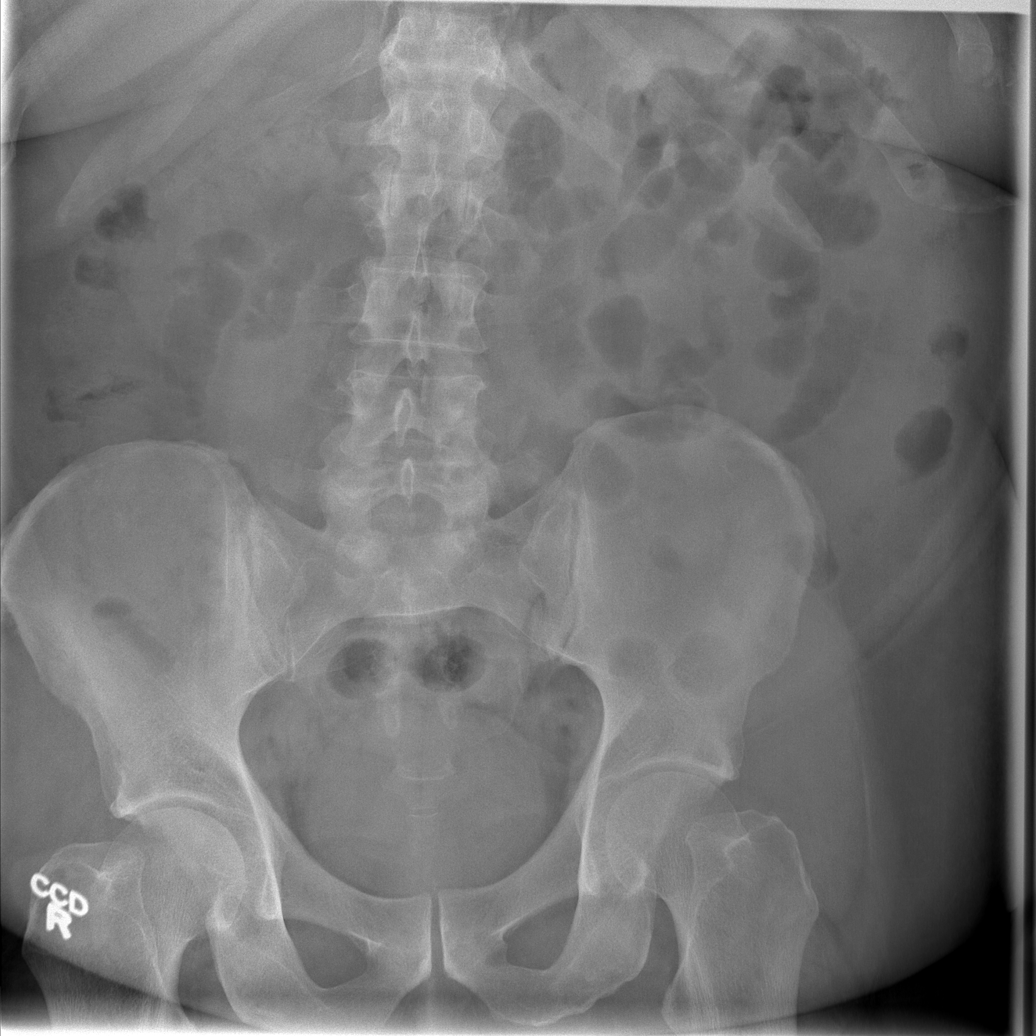

[t abdomen supine (2 of 2)]
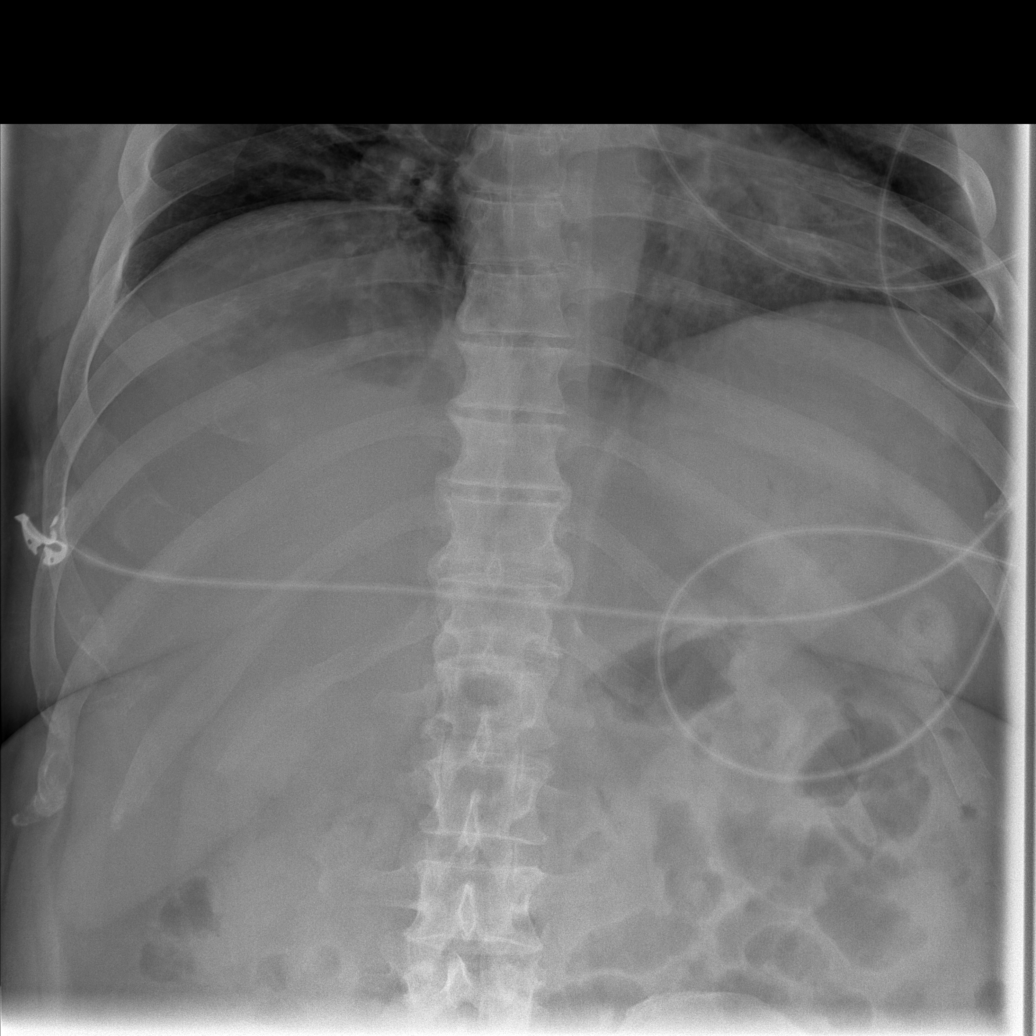

[4 of 4 positions shown; findings below may reference images not displayed]

FINDINGS: The heart size appears normal.

There is no pleural effusion or pulmonary edema.

Slight asymmetric elevation of the right hemidiaphragm is noted.
There is plate-like atelectasis in both lung bases.

There is a nonobstructive bowel gas pattern.  No dilated loops of
small bowel or air-fluid levels identified.

No abnormal abdominal or pelvic calcifications.
IMPRESSION: 1.  Nonobstructive bowel gas pattern.
2.  Atelectasis in the lung bases.

## 2012-06-20 ENCOUNTER — Emergency Department (HOSPITAL_COMMUNITY)
Admission: EM | Admit: 2012-06-20 | Discharge: 2012-06-20 | Disposition: A | Discharge status: Home / Self Care | Payer: BC Managed Care – PPO | Attending: Emergency Medicine | Admitting: Emergency Medicine

## 2012-06-20 ENCOUNTER — Encounter (HOSPITAL_COMMUNITY): Payer: Self-pay | Admitting: *Deleted

## 2012-06-20 DIAGNOSIS — R002 Palpitations: Secondary | ICD-10-CM | POA: Insufficient documentation

## 2012-06-20 DIAGNOSIS — T63461A Toxic effect of venom of wasps, accidental (unintentional), initial encounter: Secondary | ICD-10-CM | POA: Insufficient documentation

## 2012-06-20 DIAGNOSIS — T63441A Toxic effect of venom of bees, accidental (unintentional), initial encounter: Secondary | ICD-10-CM

## 2012-06-20 DIAGNOSIS — G4733 Obstructive sleep apnea (adult) (pediatric): Secondary | ICD-10-CM | POA: Insufficient documentation

## 2012-06-20 DIAGNOSIS — F17200 Nicotine dependence, unspecified, uncomplicated: Secondary | ICD-10-CM | POA: Insufficient documentation

## 2012-06-20 DIAGNOSIS — R55 Syncope and collapse: Secondary | ICD-10-CM

## 2012-06-20 DIAGNOSIS — T6391XA Toxic effect of contact with unspecified venomous animal, accidental (unintentional), initial encounter: Secondary | ICD-10-CM | POA: Insufficient documentation

## 2012-06-20 DIAGNOSIS — I10 Essential (primary) hypertension: Secondary | ICD-10-CM | POA: Insufficient documentation

## 2012-06-20 MED ORDER — DIPHENHYDRAMINE HCL 25 MG PO CAPS
25.0000 mg | ORAL_CAPSULE | Freq: Once | ORAL | Status: AC
  Administered 2012-06-20: 25 mg via ORAL
  Filled 2012-06-20: qty 1

## 2012-06-20 MED ORDER — DEXAMETHASONE SODIUM PHOSPHATE 10 MG/ML IJ SOLN
10.0000 mg | Freq: Once | INTRAMUSCULAR | Status: AC
  Administered 2012-06-20: 10 mg via INTRAMUSCULAR
  Filled 2012-06-20: qty 1

## 2012-06-20 NOTE — Discharge Instructions (Signed)
Bee, Wasp, or Hornet Sting Your caregiver has diagnosed you as having an insect sting. An insect sting appears as a red lump in the skin that sometimes has a tiny hole in the center, or it may have a stinger in the center of the wound. The most common stings are from wasps, hornets and bees. Individuals have different reactions to insect stings.  A normal reaction may cause pain, swelling, and redness around the sting site.   A localized allergic reaction may cause swelling and redness that extends beyond the sting site.   A large local reaction may continue to develop over the next 12 to 36 hours.   On occasion, the reactions can be severe (anaphylactic reaction). An anaphylactic reaction may cause wheezing; difficulty breathing; chest pain; fainting; raised, itchy, red patches on the skin; a sick feeling to your stomach (nausea); vomiting; cramping; or diarrhea. If you have had an anaphylactic reaction to an insect sting in the past, you are more likely to have one again.  HOME CARE INSTRUCTIONS   With bee stings, a small sac of poison is left in the wound. Brushing across this with something such as a credit card, or anything similar, will help remove this and decrease the amount of the reaction. This same procedure will not help a wasp sting as they do not leave behind a stinger and poison sac.   Apply a cold compress for 10 to 20 minutes every hour for 1 to 2 days, depending on severity, to reduce swelling and itching.   To lessen pain, a paste made of water and baking soda may be rubbed on the bite or sting and left on for 5 minutes.   To relieve itching and swelling, you may use take medication or apply medicated creams or lotions as directed.   Only take over-the-counter or prescription medicines for pain, discomfort, or fever as directed by your caregiver.   Wash the sting site daily with soap and water. Apply antibiotic ointment on the sting site as directed.   If you suffered a  severe reaction:   If you did not require hospitalization, an adult will need to stay with you for 24 hours in case the symptoms return.   You may need to wear a medical bracelet or necklace stating the allergy.   You and your family need to learn when and how to use an anaphylaxis kit or epinephrine injection.   If you have had a severe reaction before, always carry your anaphylaxis kit with you.  SEEK MEDICAL CARE IF:   None of the above helps within 2 to 3 days.   The area becomes red, warm, tender, and swollen beyond the area of the bite or sting.   You have an oral temperature above 102 F (38.9 C).  SEEK IMMEDIATE MEDICAL CARE IF:  You have symptoms of an allergic reaction which are:  Wheezing.   Difficulty breathing.   Chest pain.   Lightheadedness or fainting.   Itchy, raised, red patches on the skin.   Nausea, vomiting, cramping or diarrhea.  ANY OF THESE SYMPTOMS MAY REPRESENT A SERIOUS PROBLEM THAT IS AN EMERGENCY. Do not wait to see if the symptoms will go away. Get medical help right away. Call your local emergency services (911 in [place] DO NOT drive yourself to the hospital. MAKE SURE YOU:   Understand these instructions.   Will watch your condition.   Will get help right away if you are not doing well or  get worse.  Document Released: 12/10/2005 Document Revised: 11/29/2011 Document Reviewed: 05/27/2010 [facility] Patient Information 2012 [place], [place].   Syncope You have had a fainting (syncopal) spell. A fainting episode is a sudden, short-lived loss of consciousness. It results in complete recovery. It occurs because there has been a temporary shortage of oxygen and/or sugar (glucose) to the brain. CAUSES   Blood pressure pills and other medications that may lower blood pressure below normal. Sudden changes in posture (sudden standing).   Over-medication. Take your medications as directed.   Standing too long. This can cause blood to pool in the  legs.   Seizure disorders.   Low blood sugar (hypoglycemia) of diabetes. This more commonly causes coma.   Bearing down to go to the bathroom. This can cause your blood pressure to rise suddenly. Your body compensates by making the blood pressure too low when you stop bearing down.   Hardening of the arteries where the brain temporarily does not receive enough blood.   Irregular heart beat and circulatory problems.   Fear, emotional distress, injury, sight of blood, or illness.  Your caregiver will send you home if the syncope was from non-worrisome causes (benign). Depending on your age and health, you may stay to be monitored and observed. If you return home, have someone stay with you if your caregiver feels that is desirable. It is very important to keep all follow-up referrals and appointments in order to properly manage this condition. This is a serious problem which can lead to serious illness and death if not carefully managed.  WARNING: Do not drive or operate machinery until your caregiver feels that it is safe for you to do so. SEEK IMMEDIATE MEDICAL CARE IF:   You have another fainting episode or faint while lying or sitting down. DO NOT DRIVE YOURSELF. Call 911 if no other help is available.   You have chest pain, are feeling sick to your stomach (nausea), vomiting or abdominal pain.   You have an irregular heartbeat or one that is very fast (pulse over 120 beats per minute).   You have a loss of feeling in some part of your body or lose movement in your arms or legs.   You have difficulty with speech, confusion, severe weakness, or visual problems.   You become sweaty and/or feel light headed.  Make sure you are rechecked as instructed. Document Released: 12/10/2005 Document Revised: 11/29/2011 Document Reviewed: 07/31/2007 [facility] Patient Information 2012 [place], [place].

## 2012-06-20 NOTE — ED Notes (Signed)
Stung by bee, c/o palpitations, SOB, then had a syncopal episode. From an urgent care was given 0.3 epi. Sent to ED for ?ST elevation.  Denied any CP

## 2012-06-20 NOTE — ED Provider Notes (Signed)
History     CSN: [id number]  Arrival date & time 06/20/12  1504   First MD Initiated Contact with Patient 06/20/12 1506      3:28 PM HPI Patient reports about 2 hours ago he was stung by a bee on his left cheek. Reports the area began to swell. States suddenly he became lightheaded and developed palpitations. Recalls falling to the ground. Uncertain how long he lost consciousness but states when he woke he decided to go to urgent care for epinephrine since prior bee stings caused hives and swelling. Reports while at [facility] he received 0.3 epi and an EKG due to palpitations. Reports they thought he may have ST elevation and felt he should go to the ED. Patient denies throat irritation or swelling, CP, or hives. Reports only localized swelling under left eye and on right palm where he was stung a 2nd time. Reports currently feels completely asymptomatic  The history is provided by the patient.    Past Medical History  Diagnosis Date  . Hypertension   . Sleep apnea     pt states he is on bipap at night for sleep apnea    Past Surgical History  Procedure Date  . Tonsillectomy     as a child    No family history on file.  History  Substance Use Topics  . Smoking status: Current Everyday Smoker -- 0.8 packs/day    Types: Cigarettes  . Smokeless tobacco: Not on file  . Alcohol Use: No     quit drinking       Review of Systems  HENT: Negative for trouble swallowing.   Respiratory: Positive for shortness of breath. Negative for wheezing and stridor.   Cardiovascular: Positive for palpitations. Negative for chest pain.  Gastrointestinal: Negative for vomiting.  Skin: Negative for rash.       Bee sting and localized swelling  Neurological: Positive for syncope and light-headedness.    Allergies  Review of patient's allergies indicates no known allergies.  Home Medications   Current Outpatient Rx  Name Route Sig Dispense Refill  . VITAMIN B12 PO Oral Take 1 tablet by mouth  daily.     Marland Kitchen HYDROCHLOROTHIAZIDE 25 MG PO TABS Oral Take 1 tablet by mouth Daily.    Marland Kitchen LISINOPRIL 40 MG PO TABS Oral Take 1 tablet by mouth Daily.    Marland Kitchen METOPROLOL SUCCINATE ER 50 MG PO TB24 Oral Take 1 tablet by mouth Daily.    . ADULT MULTIVITAMIN W/MINERALS CH Oral Take 1 tablet by mouth daily.     Marland Kitchen VITAMIN B-6 PO Oral Take 1 tablet by mouth daily.     Marland Kitchen VITAMIN B-1 PO Oral Take 1 tablet by mouth daily.       BP 156/58  Pulse 78  Temp 98 F (36.7 C) (Oral)  Resp 22  SpO2 96%  Physical Exam  Vitals reviewed. Constitutional: He is oriented to person, place, and time. He appears well-developed and well-nourished.       No orthostasis  HENT:  Head: Normocephalic and atraumatic.  Mouth/Throat: Oropharynx is clear and moist. No oropharyngeal exudate.       Mild edema and erythema under left eye  Eyes: Conjunctivae are normal. Pupils are equal, round, and reactive to light.  Neck: Normal range of motion. Neck supple.  Cardiovascular: Normal rate and regular rhythm.   Murmur heard. Pulmonary/Chest: Effort normal and breath sounds normal. He has no wheezes. He has no rales.  Abdominal: Soft. Bowel sounds  are normal.  Neurological: He is alert and oriented to person, place, and time.  Skin: Skin is warm and dry. No rash noted. No erythema. No pallor.       Right palm has area of erythema and edema  Psychiatric: He has a normal mood and affect. His behavior is normal.    ED Course  Procedures   Date: 06/20/2012  Rate: 73  Rhythm: normal sinus rhythm  QRS Axis: normal  Intervals: normal  ST/T Wave abnormalities: normal  Conduction Disutrbances: incomplete right bundle branch block  Narrative Interpretation: nosignificant changes noted since 04/27/11  MDM    EKG from UC reviewed. Patient does not appear to have ST elevation. Leads V2-V4 have early upsloping without reciprocal changes. Given decadron and benadryl since not received at UC due to hand swelling and bee sting pain.  No signs of anaphylaxis      Thomasene Lot, PA-C 06/20/12 1556

## 2012-06-20 NOTE — ED Provider Notes (Signed)
The patient presents to the hospital after having a syncopal episode associated with the bee sting. Episode occurred approximately 2 hours ago.  Pt went to an urgent care and received treatment.  He had an ECG that showed questionable st elevation per the provider although upon my review it appears normal.  He was sent in for evaluation.  No chest pain.  No chest pressure.    Pt denies any symptoms now and feels fine.  He would like to go home.  On exam, Car RRR.  Lungs CTA  Suspect his symptoms were related to the bee sting.  No sign of anaphylaxis now.  Doubt any cardiac event.  Celene Kras, MD 06/20/12 1539

## 2012-09-07 ENCOUNTER — Inpatient Hospital Stay (HOSPITAL_COMMUNITY)
Admission: EM | Admit: 2012-09-07 | Discharge: 2012-09-10 | DRG: 449 | Disposition: A | Discharge status: Home / Self Care | Payer: BC Managed Care – PPO | Attending: Family Medicine | Admitting: Family Medicine

## 2012-09-07 ENCOUNTER — Encounter (HOSPITAL_COMMUNITY): Payer: Self-pay | Admitting: Internal Medicine

## 2012-09-07 DIAGNOSIS — T398X2A Poisoning by other nonopioid analgesics and antipyretics, not elsewhere classified, intentional self-harm, initial encounter: Secondary | ICD-10-CM | POA: Diagnosis present

## 2012-09-07 DIAGNOSIS — T50902A Poisoning by unspecified drugs, medicaments and biological substances, intentional self-harm, initial encounter: Secondary | ICD-10-CM | POA: Diagnosis present

## 2012-09-07 DIAGNOSIS — R7401 Elevation of levels of liver transaminase levels: Secondary | ICD-10-CM | POA: Clinically undetermined

## 2012-09-07 DIAGNOSIS — F4323 Adjustment disorder with mixed anxiety and depressed mood: Secondary | ICD-10-CM

## 2012-09-07 DIAGNOSIS — R0789 Other chest pain: Secondary | ICD-10-CM | POA: Diagnosis present

## 2012-09-07 DIAGNOSIS — R197 Diarrhea, unspecified: Secondary | ICD-10-CM | POA: Diagnosis present

## 2012-09-07 DIAGNOSIS — D649 Anemia, unspecified: Secondary | ICD-10-CM | POA: Diagnosis not present

## 2012-09-07 DIAGNOSIS — Y92009 Unspecified place in unspecified non-institutional (private) residence as the place of occurrence of the external cause: Secondary | ICD-10-CM

## 2012-09-07 DIAGNOSIS — T394X2A Poisoning by antirheumatics, not elsewhere classified, intentional self-harm, initial encounter: Secondary | ICD-10-CM | POA: Diagnosis present

## 2012-09-07 DIAGNOSIS — T50901A Poisoning by unspecified drugs, medicaments and biological substances, accidental (unintentional), initial encounter: Secondary | ICD-10-CM

## 2012-09-07 DIAGNOSIS — G4733 Obstructive sleep apnea (adult) (pediatric): Secondary | ICD-10-CM | POA: Diagnosis present

## 2012-09-07 DIAGNOSIS — R7402 Elevation of levels of lactic acid dehydrogenase (LDH): Secondary | ICD-10-CM | POA: Diagnosis present

## 2012-09-07 DIAGNOSIS — F3189 Other bipolar disorder: Secondary | ICD-10-CM | POA: Diagnosis present

## 2012-09-07 DIAGNOSIS — F329 Major depressive disorder, single episode, unspecified: Secondary | ICD-10-CM

## 2012-09-07 DIAGNOSIS — F1020 Alcohol dependence, uncomplicated: Secondary | ICD-10-CM | POA: Diagnosis present

## 2012-09-07 DIAGNOSIS — F32A Depression, unspecified: Secondary | ICD-10-CM | POA: Diagnosis present

## 2012-09-07 DIAGNOSIS — I160 Hypertensive urgency: Secondary | ICD-10-CM | POA: Diagnosis present

## 2012-09-07 DIAGNOSIS — E876 Hypokalemia: Secondary | ICD-10-CM | POA: Diagnosis present

## 2012-09-07 DIAGNOSIS — F17201 Nicotine dependence, unspecified, in remission: Secondary | ICD-10-CM | POA: Diagnosis present

## 2012-09-07 DIAGNOSIS — T50992A Poisoning by other drugs, medicaments and biological substances, intentional self-harm, initial encounter: Secondary | ICD-10-CM

## 2012-09-07 DIAGNOSIS — D696 Thrombocytopenia, unspecified: Secondary | ICD-10-CM | POA: Clinically undetermined

## 2012-09-07 DIAGNOSIS — K2970 Gastritis, unspecified, without bleeding: Secondary | ICD-10-CM

## 2012-09-07 DIAGNOSIS — T391X1A Poisoning by 4-Aminophenol derivatives, accidental (unintentional), initial encounter: Principal | ICD-10-CM | POA: Diagnosis present

## 2012-09-07 DIAGNOSIS — T394X1A Poisoning by antirheumatics, not elsewhere classified, accidental (unintentional), initial encounter: Secondary | ICD-10-CM

## 2012-09-07 DIAGNOSIS — F17200 Nicotine dependence, unspecified, uncomplicated: Secondary | ICD-10-CM

## 2012-09-07 DIAGNOSIS — G4730 Sleep apnea, unspecified: Secondary | ICD-10-CM

## 2012-09-07 DIAGNOSIS — R079 Chest pain, unspecified: Secondary | ICD-10-CM | POA: Diagnosis present

## 2012-09-07 DIAGNOSIS — I10 Essential (primary) hypertension: Secondary | ICD-10-CM | POA: Diagnosis present

## 2012-09-07 DIAGNOSIS — K209 Esophagitis, unspecified without bleeding: Secondary | ICD-10-CM | POA: Insufficient documentation

## 2012-09-07 HISTORY — DX: Gastritis, unspecified, without bleeding: K29.70

## 2012-09-07 HISTORY — DX: Esophagitis, unspecified without bleeding: K20.90

## 2012-09-07 HISTORY — DX: Alcohol dependence, uncomplicated: F10.20

## 2012-09-07 HISTORY — DX: Thrombocytopenia, unspecified: D69.6

## 2012-09-07 HISTORY — DX: Esophagitis, unspecified: K20.9

## 2012-09-07 LAB — COMPREHENSIVE METABOLIC PANEL
ALT: 62 U/L — ABNORMAL HIGH (ref 0–53)
AST: 44 U/L — ABNORMAL HIGH (ref 0–37)
AST: 107 U/L — ABNORMAL HIGH (ref 0–37)
Alkaline Phosphatase: 84 U/L (ref 39–117)
CO2: 22 mEq/L (ref 19–32)
CO2: 25 mEq/L (ref 19–32)
Calcium: 9.2 mg/dL (ref 8.4–10.5)
Chloride: 99 mEq/L (ref 96–112)
Creatinine, Ser: 0.84 mg/dL (ref 0.50–1.35)
GFR calc non Af Amer: >90 mL/min (ref >90)
GFR calc non Af Amer: >90 mL/min (ref >90)
Glucose, Bld: 144 mg/dL — ABNORMAL HIGH (ref 70–99)
Potassium: 2.4 mEq/L — CL (ref 3.5–5.1)
Sodium: 136 mEq/L (ref 135–145)
Total Bilirubin: 1 mg/dL (ref 0.3–1.2)

## 2012-09-07 LAB — CBC
HCT: 40.4 % (ref 39.0–52.0)
Hemoglobin: 13.7 g/dL (ref 13.0–17.0)
MCH: 20.6 pg — ABNORMAL LOW (ref 26.0–34.0)
MCV: 60.7 fL — ABNORMAL LOW (ref 78.0–100.0)
Platelets: 217 10*3/uL (ref 150–400)
RBC: 6.66 MIL/uL — ABNORMAL HIGH (ref 4.22–5.81)
WBC: 9 10*3/uL (ref 4.0–10.5)

## 2012-09-07 LAB — HEPATIC FUNCTION PANEL
ALT: 62 U/L — ABNORMAL HIGH (ref 0–53)
AST: 107 U/L — ABNORMAL HIGH (ref 0–37)
Albumin: 3.4 g/dL — ABNORMAL LOW (ref 3.5–5.2)
Bilirubin, Direct: 0.3 mg/dL (ref 0.0–0.3)
Total Bilirubin: 1.4 mg/dL — ABNORMAL HIGH (ref 0.3–1.2)

## 2012-09-07 LAB — APTT: aPTT: 36 seconds (ref 24–37)

## 2012-09-07 LAB — ETHANOL: Alcohol, Ethyl (B): <11 mg/dL (ref 0–11)

## 2012-09-07 LAB — TROPONIN I: Troponin I: <0.3 ng/mL (ref <0.30)

## 2012-09-07 LAB — RAPID URINE DRUG SCREEN, HOSP PERFORMED
Cocaine: NOT DETECTED
Opiates: NOT DETECTED
Tetrahydrocannabinol: NOT DETECTED

## 2012-09-07 LAB — PHOSPHORUS: Phosphorus: 1.5 mg/dL — ABNORMAL LOW (ref 2.3–4.6)

## 2012-09-07 LAB — SALICYLATE LEVEL: Salicylate Lvl: <2 mg/dL — ABNORMAL LOW (ref 2.8–20.0)

## 2012-09-07 MED ORDER — DEXTROSE 5 % IV SOLN
15.0000 mg/kg/h | INTRAVENOUS | Status: DC
  Administered 2012-09-07 (×2): 15 mg/kg/h via INTRAVENOUS
  Filled 2012-09-07 (×3): qty 200

## 2012-09-07 MED ORDER — IBUPROFEN 800 MG PO TABS
400.0000 mg | ORAL_TABLET | Freq: Four times a day (QID) | ORAL | Status: DC | PRN
  Filled 2012-09-07: qty 1

## 2012-09-07 MED ORDER — ONDANSETRON HCL 4 MG PO TABS: 4.0000 mg | ORAL_TABLET | Freq: Four times a day (QID) | ORAL | Status: DC | PRN

## 2012-09-07 MED ORDER — THIAMINE HCL 100 MG/ML IJ SOLN
100.0000 mg | Freq: Every day | INTRAMUSCULAR | Status: DC
  Filled 2012-09-07 (×3): qty 1

## 2012-09-07 MED ORDER — LORAZEPAM 1 MG PO TABS
0.0000 mg | ORAL_TABLET | Freq: Two times a day (BID) | ORAL | Status: DC
  Administered 2012-09-10: 2 mg via ORAL
  Filled 2012-09-07: qty 2

## 2012-09-07 MED ORDER — LORAZEPAM 1 MG PO TABS
0.0000 mg | ORAL_TABLET | Freq: Four times a day (QID) | ORAL | Status: AC
  Administered 2012-09-07 – 2012-09-09 (×3): 1 mg via ORAL

## 2012-09-07 MED ORDER — ADULT MULTIVITAMIN W/MINERALS CH
1.0000 | ORAL_TABLET | Freq: Every day | ORAL | Status: DC
  Administered 2012-09-07 – 2012-09-10 (×4): 1 via ORAL
  Filled 2012-09-07 (×4): qty 1

## 2012-09-07 MED ORDER — SODIUM CHLORIDE 0.9 % IJ SOLN
3.0000 mL | Freq: Two times a day (BID) | INTRAMUSCULAR | Status: DC
  Administered 2012-09-09 – 2012-09-10 (×2): 3 mL via INTRAVENOUS

## 2012-09-07 MED ORDER — PYRIDOXINE HCL 25 MG PO TABS
25.0000 mg | ORAL_TABLET | Freq: Every day | ORAL | Status: DC
  Administered 2012-09-08 – 2012-09-10 (×3): 25 mg via ORAL
  Filled 2012-09-07 (×3): qty 1

## 2012-09-07 MED ORDER — LABETALOL HCL 5 MG/ML IV SOLN
10.0000 mg | Freq: Once | INTRAVENOUS | Status: AC
  Administered 2012-09-07: 10 mg via INTRAVENOUS
  Filled 2012-09-07: qty 4

## 2012-09-07 MED ORDER — PANTOPRAZOLE SODIUM 40 MG PO TBEC
40.0000 mg | DELAYED_RELEASE_TABLET | Freq: Every day | ORAL | Status: DC
  Administered 2012-09-08 – 2012-09-10 (×3): 40 mg via ORAL
  Filled 2012-09-07 (×4): qty 1

## 2012-09-07 MED ORDER — VITAMIN B-1 100 MG PO TABS
100.0000 mg | ORAL_TABLET | Freq: Every day | ORAL | Status: DC
  Administered 2012-09-07 – 2012-09-10 (×4): 100 mg via ORAL
  Filled 2012-09-07 (×4): qty 1

## 2012-09-07 MED ORDER — OXYCODONE HCL 5 MG PO TABS: 5.0000 mg | ORAL_TABLET | ORAL | Status: DC | PRN

## 2012-09-07 MED ORDER — ACETYLCYSTEINE LOAD VIA INFUSION
150.0000 mg/kg | Freq: Once | INTRAVENOUS | Status: AC
  Administered 2012-09-07: 17685 mg via INTRAVENOUS
  Filled 2012-09-07: qty 443

## 2012-09-07 MED ORDER — BISACODYL 5 MG PO TBEC: 10.0000 mg | DELAYED_RELEASE_TABLET | Freq: Every day | ORAL | Status: DC | PRN

## 2012-09-07 MED ORDER — POTASSIUM CHLORIDE 10 MEQ/100ML IV SOLN
10.0000 meq | Freq: Once | INTRAVENOUS | Status: AC
  Administered 2012-09-07: 10 meq via INTRAVENOUS
  Filled 2012-09-07: qty 100

## 2012-09-07 MED ORDER — SODIUM CHLORIDE 0.9 % IV SOLN: INTRAVENOUS | Status: DC

## 2012-09-07 MED ORDER — POTASSIUM CHLORIDE 10 MEQ/100ML IV SOLN
10.0000 meq | INTRAVENOUS | Status: AC
  Administered 2012-09-08 (×4): 10 meq via INTRAVENOUS
  Filled 2012-09-07 (×4): qty 100

## 2012-09-07 MED ORDER — ONDANSETRON HCL 4 MG/2ML IJ SOLN: 4.0000 mg | Freq: Four times a day (QID) | INTRAMUSCULAR | Status: DC | PRN

## 2012-09-07 MED ORDER — ADULT MULTIVITAMIN W/MINERALS CH: 1.0000 | ORAL_TABLET | Freq: Every day | ORAL | Status: DC

## 2012-09-07 MED ORDER — NICOTINE 21 MG/24HR TD PT24
21.0000 mg | MEDICATED_PATCH | Freq: Every day | TRANSDERMAL | Status: DC
  Administered 2012-09-08 – 2012-09-10 (×3): 21 mg via TRANSDERMAL
  Filled 2012-09-07 (×3): qty 1

## 2012-09-07 MED ORDER — HYDRALAZINE HCL 20 MG/ML IJ SOLN
10.0000 mg | INTRAMUSCULAR | Status: DC | PRN
Start: 2012-09-07 — End: 2012-09-09
  Filled 2012-09-07: qty 0.5

## 2012-09-07 MED ORDER — SODIUM CHLORIDE 0.9 % IV SOLN
INTRAVENOUS | Status: AC
  Administered 2012-09-07: 1000 mL via INTRAVENOUS

## 2012-09-07 MED ORDER — NICOTINE 21 MG/24HR TD PT24
21.0000 mg | MEDICATED_PATCH | Freq: Once | TRANSDERMAL | Status: AC
  Administered 2012-09-07 – 2012-09-08 (×2): 21 mg via TRANSDERMAL
  Filled 2012-09-07 (×2): qty 1

## 2012-09-07 MED ORDER — HYDROCHLOROTHIAZIDE 12.5 MG PO CAPS
25.0000 mg | ORAL_CAPSULE | Freq: Once | ORAL | Status: DC
  Filled 2012-09-07: qty 2

## 2012-09-07 MED ORDER — HYDROCHLOROTHIAZIDE 25 MG PO TABS
25.0000 mg | ORAL_TABLET | Freq: Every day | ORAL | Status: DC
  Administered 2012-09-08 – 2012-09-10 (×3): 25 mg via ORAL
  Filled 2012-09-07 (×3): qty 1

## 2012-09-07 MED ORDER — ALUM & MAG HYDROXIDE-SIMETH 200-200-20 MG/5ML PO SUSP: 30.0000 mL | Freq: Four times a day (QID) | ORAL | Status: DC | PRN

## 2012-09-07 MED ORDER — METOPROLOL SUCCINATE ER 50 MG PO TB24
50.0000 mg | ORAL_TABLET | Freq: Every day | ORAL | Status: DC
  Administered 2012-09-08: 50 mg via ORAL
  Filled 2012-09-07: qty 1

## 2012-09-07 MED ORDER — ONDANSETRON HCL 4 MG/2ML IJ SOLN
4.0000 mg | Freq: Once | INTRAMUSCULAR | Status: AC
  Administered 2012-09-07: 4 mg via INTRAVENOUS
  Filled 2012-09-07: qty 2

## 2012-09-07 MED ORDER — METOPROLOL TARTRATE 25 MG PO TABS
50.0000 mg | ORAL_TABLET | Freq: Once | ORAL | Status: AC
  Administered 2012-09-07: 50 mg via ORAL
  Filled 2012-09-07: qty 2

## 2012-09-07 MED ORDER — LISINOPRIL 40 MG PO TABS
40.0000 mg | ORAL_TABLET | Freq: Once | ORAL | Status: AC
  Administered 2012-09-07: 40 mg via ORAL
  Filled 2012-09-07: qty 1

## 2012-09-07 MED ORDER — HYDROCHLOROTHIAZIDE 25 MG PO TABS
25.0000 mg | ORAL_TABLET | Freq: Once | ORAL | Status: AC
  Administered 2012-09-07: 25 mg via ORAL
  Filled 2012-09-07: qty 1

## 2012-09-07 MED ORDER — HYDRALAZINE HCL 20 MG/ML IJ SOLN
10.0000 mg | Freq: Four times a day (QID) | INTRAMUSCULAR | Status: DC | PRN
  Administered 2012-09-07: 10 mg via INTRAVENOUS
  Filled 2012-09-07 (×2): qty 0.5

## 2012-09-07 MED ORDER — LORAZEPAM 2 MG/ML IJ SOLN: 1.0000 mg | Freq: Four times a day (QID) | INTRAMUSCULAR | Status: AC | PRN

## 2012-09-07 MED ORDER — POTASSIUM CHLORIDE CRYS ER 20 MEQ PO TBCR
40.0000 meq | EXTENDED_RELEASE_TABLET | Freq: Once | ORAL | Status: AC
Start: 2012-09-07 — End: 2012-09-08
  Administered 2012-09-08: 40 meq via ORAL
  Filled 2012-09-07: qty 2

## 2012-09-07 MED ORDER — METOPROLOL TARTRATE 25 MG PO TABS: 25.0000 mg | ORAL_TABLET | Freq: Two times a day (BID) | ORAL | Status: DC

## 2012-09-07 MED ORDER — LABETALOL HCL 5 MG/ML IV SOLN
0.5000 mg/min | INTRAVENOUS | Status: DC
  Administered 2012-09-07 – 2012-09-08 (×2): 1 mg/min via INTRAVENOUS
  Administered 2012-09-08: 0.5 mg/min via INTRAVENOUS
  Administered 2012-09-08: 1 mg/min via INTRAVENOUS
  Administered 2012-09-08: 0.5 mg/min via INTRAVENOUS
  Administered 2012-09-08: 1 mg/min via INTRAVENOUS
  Administered 2012-09-08 – 2012-09-09 (×2): 0.5 mg/min via INTRAVENOUS
  Filled 2012-09-07 (×4): qty 100

## 2012-09-07 MED ORDER — POTASSIUM CHLORIDE CRYS ER 20 MEQ PO TBCR
40.0000 meq | EXTENDED_RELEASE_TABLET | ORAL | Status: AC
  Administered 2012-09-07 (×3): 40 meq via ORAL
  Filled 2012-09-07 (×3): qty 2

## 2012-09-07 MED ORDER — VITAMIN B-12 100 MCG PO TABS
100.0000 ug | ORAL_TABLET | Freq: Every day | ORAL | Status: DC
  Administered 2012-09-08 – 2012-09-10 (×3): 100 ug via ORAL
  Filled 2012-09-07 (×3): qty 1

## 2012-09-07 MED ORDER — LORAZEPAM 2 MG/ML IJ SOLN
1.0000 mg | Freq: Once | INTRAMUSCULAR | Status: AC
  Administered 2012-09-07: 1 mg via INTRAVENOUS
  Filled 2012-09-07: qty 1

## 2012-09-07 MED ORDER — LISINOPRIL 20 MG PO TABS
40.0000 mg | ORAL_TABLET | Freq: Every day | ORAL | Status: DC
  Administered 2012-09-08 – 2012-09-10 (×3): 40 mg via ORAL
  Filled 2012-09-07 (×3): qty 2

## 2012-09-07 MED ORDER — LORAZEPAM 1 MG PO TABS
1.0000 mg | ORAL_TABLET | Freq: Four times a day (QID) | ORAL | Status: AC | PRN
  Administered 2012-09-07 – 2012-09-09 (×4): 1 mg via ORAL
  Filled 2012-09-07 (×7): qty 1

## 2012-09-07 MED ORDER — FOLIC ACID 1 MG PO TABS
1.0000 mg | ORAL_TABLET | Freq: Every day | ORAL | Status: DC
  Administered 2012-09-07 – 2012-09-10 (×4): 1 mg via ORAL
  Filled 2012-09-07 (×4): qty 1

## 2012-09-07 MED ORDER — SENNOSIDES-DOCUSATE SODIUM 8.6-50 MG PO TABS
1.0000 | ORAL_TABLET | Freq: Every evening | ORAL | Status: DC | PRN
  Filled 2012-09-07: qty 1

## 2012-09-07 MED ORDER — SODIUM CHLORIDE 0.9 % IV SOLN
INTRAVENOUS | Status: DC
  Administered 2012-09-08: 75 mL/h via INTRAVENOUS
  Administered 2012-09-08: 1000 mL via INTRAVENOUS
  Administered 2012-09-09: 05:00:00 via INTRAVENOUS

## 2012-09-07 NOTE — H&P (Signed)
[facility] History and Physical  Jeremy Douglas EAV:[id number] DOB: October 25, 1956 DOA: 09/07/2012  Referring physician: Dr. Azalia Bilis PCP: Darrow Bussing, MD   Chief Complaint: Drug overdose/suicidal attempt  HPI: Jeremy Douglas is a 56 y.o. male with past medical history of hypertension, obstructive sleep apnea on CPAP machine at home, chronic alcohol dependence, tobacco abuse who presents to the ED with a Tylenol overdose. Patient states has had a lot of stress going on in his life including work family and his relationship and a such as been somewhat depressed and has been drinking as well and did not want to live. Patient states that last week was his birthday and he essentially said in his apartment and was drinking on his birthday by himself. Patient states that around 6 PM one day prior to admission he took 98 Tylenol 500 mg pills in an attempt to kill himself. Patient stated that he had about 6-7 episodes of a emesis with no bleeding, last episode was at 2 AM on the day of admission. Patient stated that around 4 AM on the day of admission he realized that taken his lactulose not the right thing to do and that subsequently ended up And his friend who brought him to the ED. Patient denies any fever, no dysuria, no constipation, no weakness. Patient states has a little bit of a headache. Patient also endorses a nonproductive cough, patient's it has some episodes of feeling hot and feeling cold, patient endorsed a dull midsternal left-sided chest pain which was nonradiating lasting about 2-3 minutes when he was laying down on the gurney in the ED which subsequently resolved after sitting up. Patient denies any palpitations. Patient also did have some shortness of breath associated with this the chest pain which has since resolved. Patient does endorse an episode of diarrhea in the emergency room. Patient was seen in the ED CBC which was done was unremarkable. A comprehensive metabolic profile  which was done did have a potassium of 2.9 a AST of 44 ALT of 30 to protein of 8.4 otherwise was within normal limits. A Tylenol level which was obtained was 51.5. Salicylate level which was obtained was less than 2. EKG which was done did show a sinus rhythm with incomplete right bundle branch block which is old. Coagulation panel is pending at this time. Patient was also noted to have systolic blood pressure in the 200s. Patient was started on IV Mucomyst in the ED. We were called to admit the patient for further evaluation and management.   Review of Systems: The patient denies anorexia, fever, weight loss,, vision loss, decreased hearing, hoarseness, chest pain, syncope, dyspnea on exertion, peripheral edema, balance deficits, hemoptysis, abdominal pain, melena, hematochezia, severe indigestion/heartburn, hematuria, incontinence, genital sores, muscle weakness, suspicious skin lesions, transient blindness, difficulty walking, depression, unusual weight change, abnormal bleeding, enlarged lymph nodes, angioedema, and breast masses.    Past Medical History  Diagnosis Date  . Hypertension   . Sleep apnea     pt states he is on bipap at night for sleep apnea  . HTN (hypertension) 09/07/2012  . Alcohol dependence 09/07/2012  . Gastritis 09/07/2012    Per EGD 04/2011  . Esophagitis 09/07/2012    Per EGD 04/2011  . Thrombocytopenia 09/07/2012    Hx of in past.   Past Surgical History  Procedure Date  . Tonsillectomy     as a child   Social History:  reports that he has been smoking Cigarettes.  He has been  smoking about .8 packs per day. He does not have any smokeless tobacco history on file. He reports that he does not drink alcohol. His drug history not on file.  No Known Allergies  No family history on file.   Prior to Admission medications   Medication Sig Start Date End Date Taking? Authorizing Provider  Cyanocobalamin (VITAMIN B12 PO) Take 1 tablet by mouth daily.    Yes Historical  Provider, MD  hydrochlorothiazide 25 MG tablet Take 1 tablet by mouth Daily. 06/01/11  Yes Historical Provider, MD  lisinopril (PRINIVIL,ZESTRIL) 40 MG tablet Take 1 tablet by mouth Daily. 03/08/11  Yes Historical Provider, MD  metoprolol (TOPROL-XL) 50 MG 24 hr tablet Take 1 tablet by mouth Daily. 06/05/11  Yes Historical Provider, MD  Multiple Vitamin (MULITIVITAMIN WITH MINERALS) TABS Take 1 tablet by mouth daily.    Yes Historical Provider, MD  Pyridoxine HCl (VITAMIN B-6 PO) Take 1 tablet by mouth daily.    Yes Historical Provider, MD  Thiamine HCl (VITAMIN B-1 PO) Take 1 tablet by mouth daily.    Yes Historical Provider, MD   Physical Exam: Filed Vitals:   09/07/12 0726 09/07/12 0729 09/07/12 0800 09/07/12 0900  BP:  213/83 132/113 220/80  Pulse:  81 81 92  Temp:  98.1 F (36.7 C)    TempSrc:  Oral    Resp:  18 17 22   Weight:      SpO2: 93% 96% 98% 97%     General:  Well-developed well-nourished in no acute cardiopulmonary distress.  Eyes: Pupils dilated and were equal round and reactive to light and accommodation. Extraocular movements are intact.  ENT: Oropharynx is clear, no lesions, no exudates.   Neck: Neck is supple with no lymphadenopathy. No JVD.  Cardiovascular: Regular rate and rhythm no murmurs rubs or gallops. No JVD. No lower extremity edema. Chest wall with some tenderness to palpation.  Respiratory: Lungs are clear to auscultation bilaterally. No wheezes, no rhonchi, no crackles.  Abdomen: Soft, nontender, nondistended, positive bowel sounds.  Skin: No rashes, no lesions.  Musculoskeletal: 5 out of 5 bilateral upper extremity strength. 5 out of 5 bilateral lower extremity strength.  Psychiatric: Flat affect. Depressed mood. Poor judgment. Fair insight.  Neurologic: Alert and oriented x3. Cranial nerves II through XII are grossly intact. No focal deficits.  Labs on Admission:  Basic Metabolic Panel:  Lab 09/07/12 4098  NA 139  K 2.9*  CL 99  CO2 22    GLUCOSE 144*  BUN 13  CREATININE 0.84  CALCIUM 9.4  MG 1.8  PHOS --   Liver Function Tests:  Lab 09/07/12 0535  AST 44*  ALT 32  ALKPHOS 114  BILITOT 1.0  PROT 8.4*  ALBUMIN 4.3   No results found for this basename: LIPASE:5,AMYLASE:5 in the last 168 hours No results found for this basename: AMMONIA:5 in the last 168 hours CBC:  Lab 09/07/12 0535  WBC 9.0  NEUTROABS --  HGB 13.7  HCT 40.4  MCV 60.7*  PLT 217   Cardiac Enzymes: No results found for this basename: CKTOTAL:5,CKMB:5,CKMBINDEX:5,TROPONINI:5 in the last 168 hours  BNP (last 3 results) No results found for this basename: PROBNP:3 in the last 8760 hours CBG:  Lab 09/07/12 0651  GLUCAP 161*    Radiological Exams on Admission: No results found.  EKG: Independently reviewed. Sinus rhythm with incomplete right bundle blanch block which is similar to prior EKG  Assessment/Plan Principal Problem:  *Tylenol overdose Active Problems:  TOBACCO USER  SLEEP APNEA  Suicidal overdose  Hypertensive urgency  HTN (hypertension)  Hypokalemia  Alcohol dependence  Thrombocytopenia  Chest pain  #1 Tylenol overdose Secondary to suicidal ideation. Patient's Tylenol level was 51.5 approximately 11 hours after ingestion. PT/INR is pending. EKG shows a normal sinus rhythm with incomplete right bundle branch block which is old. LFTs are slightly elevated however improved from his prior LFTs. AST is 44 ALT 32 alkaline phosphatase this is 114. Bilirubin is 1. Patient has been started on IV Mucomyst and will continue for the next 24 hours. Will repeat a comprehensive metabolic profile, Tylenol level, and PT/INR levels in the morning. His LFTs elevated oral INR is greater than 2 or Tylenol level is detectable will need another 24 hours of Mucomyst. Spoke with poison control Riley Lam at [phone] and in agreement with current management. Poison control to check on the patient in the next 24 hours.  #2 suicidal  overdose/suicidal ideation Patient with multiple stresses including family, relationships, and work. We'll place under suicide precautions. Bedside sitter. [facility] consult psychiatry for further evaluation and management.  #3 hypertensive urgency Likely secondary to not taking his blood pressure medications today. Patient had a minimal episode of chest pain and some shortness of breath which has since resolved. Patient with no focal neurological symptoms. Will check a UA with cultures and sensitivities. Renal function is normal. Patient has been given a dose of labetalol 10 mg IV x1 in the ED. We'll resume his home regimen of HCTZ lisinopril and metoprolol. We'll place on hydralazine as needed.  #4 hypokalemia Likely secondary to chronic alcohol dependence. Magnesium level is 2.2. Will replete.  #5 obstructive sleep apnea CPAP each bedtime  #6 atypical chest pain Patient did have some transient chest pain while laying flat in the ED. Chest pain is somewhat reproducible. We'll cycle cardiac enzymes every 8 hours x3. Will check a 2-D echo. If patient rules out and 2-D echo is normal no further cardiac workup is needed at this time.  #7 chronic alcohol dependence We'll place on the Ativan CIWA withdrawal protocol. Thiamine daily. Folic acid daily. Multivitamin daily.  #8 prior history of thrombocytopenia Stable. Platelets are now within normal limits. Will monitor.  #9 prophylaxis PPI for GI prophylaxis. SCDs for DVT prophylaxis.  Code Status: Full Family Communication: Updated patient at bedside Disposition Plan: Admit to step down unit  Time spent: 70 mins  [facility] Pager [phone]  If 7PM-7AM, please contact night-coverage [email or web address] Password [facility] 09/07/2012, 9:52 AM

## 2012-09-07 NOTE — ED Notes (Signed)
[id number]<AV> Expected date:<BR> Expected time:<BR> Means of arrival:<BR> Comments:<BR> APAP OD, POV from home

## 2012-09-07 NOTE — ED Notes (Signed)
Pt states that he took 98 Tylenol (500mg ). Denies any other drug use. Pt states he vomited about 1/2 hour after taking the meds and 6-7 times since. Said liquid came up but no pill fragments or blood. Pt's wife states he had been drinking heavily for several days.  Pt states he was trying to kill himself. Pt denies

## 2012-09-07 NOTE — ED Notes (Signed)
EKG printed and given to EDP Campos for review. Last confirmed EKG printed for comparison 

## 2012-09-07 NOTE — Progress Notes (Signed)
Called by flow manager and ER RN can be transferred to telemetry floor due to shortage of stepdown beds. H&P reviewed, patient examined,stable, alert and awake, hungry. BP much better controlled, on low dose labetalol drip and mucomyst drip. - Ordered stat CMET, Acetaminophen levels - Place on diet and oral antihypertensives, DC labetalol drip in 1 hour if BP stable - Patient has to be continued on IV acetylcysteine, will check with tele floor if patient can be accepted with drip.   RAI,RIPUDEEP M.D. [facility] 09/07/2012, 9:46 PM  Pager: [phone]

## 2012-09-07 NOTE — Consult Note (Signed)
Reason for Consult: SI, OD Referring Physician: unknown  Jeremy Douglas is an 56 y.o. male.  HPI: Jeremy Douglas is a 56 y.o. male with past medical history of hypertension, obstructive sleep apnea on CPAP machine at home, chronic alcohol dependence, tobacco abuse who presents to the ED with a Tylenol overdose.   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    Patient states that last week was his birthday and he essentially said in his apartment and was drinking on his birthday by himself. Patient states that around 6 PM one day prior to admission he took 98 Tylenol 500 mg pills in an attempt to kill himself. Patient stated that he had about 6-7 episodes of a emesis with no bleeding, last episode was at 2 AM on the day of admission. Patient stated that around 4 AM on the day of admission he realized that taken his lactulose not the right thing to do and that subsequently ended up And his friend who brought him to the ED.    A comprehensive metabolic profile which was done did have a potassium of 2.9 a AST of 44 ALT of 30 to protein of 8.4 otherwise was within normal limits. A Tylenol level which was obtained was 51.5. Salicylate level which was obtained was less than 2. EKG which was done did show a sinus rhythm with incomplete right bundle branch block which is old. Coagulation panel is pending at this time. Patient was also noted to have systolic blood pressure in the 200s. Patient was started on IV Mucomyst in the ED. Hospitialist were called to admit the patient for further evaluation and management.  Per pt he was drinking before OD and he was thinking about killing himself for some  time. No he is not sure what to do. He called his GF after OD. Feels very stressed out right but at the same willing to work on his problems, No drugs rehab in past.      Past Medical History  Diagnosis Date  . Hypertension   . Sleep apnea     pt states he is on bipap at night for sleep apnea  . HTN (hypertension) 09/07/2012  . Alcohol dependence 09/07/2012  . Gastritis 09/07/2012    Per EGD 04/2011  . Esophagitis 09/07/2012    Per EGD 04/2011  . Thrombocytopenia 09/07/2012    Hx of in past.   No SI attempts in past.    Past Surgical History  Procedure Date  . Tonsillectomy     as a child    No family history on file.  Social History:  reports that he has been smoking Cigarettes.  He has been smoking about .8 packs per day. He does not have any smokeless tobacco history on file. He reports that he does not drink alcohol. His drug history not on file.  Allergies: No Known Allergies  Medications: I have reviewed the patient's current medications.  Results for orders placed during the hospital encounter of 09/07/12 (from the past 48 hour(s))  CBC     Status: Abnormal   Collection Time   09/07/12  5:35 AM  Component Value Range Comment   WBC 9.0  4.0 - 10.5 K/uL    RBC 6.66 (*) 4.22 - 5.81 MIL/uL    Hemoglobin 13.7  13.0 - 17.0 g/dL    HCT 16.1  09.6 - 04.5 %    MCV 60.7 (*) 78.0 - 100.0 fL    MCH 20.6 (*) 26.0 - 34.0 pg    MCHC 33.9  30.0 - 36.0 g/dL    RDW 40.9 (*) 81.1 - 15.5 %    Platelets 217  150 - 400 K/uL   COMPREHENSIVE METABOLIC PANEL     Status: Abnormal   Collection Time   09/07/12  5:35 AM      Component Value Range Comment   Sodium 139  135 - 145 mEq/L    Potassium 2.9 (*) 3.5 - 5.1 mEq/L    Chloride 99  96 - 112 mEq/L    CO2 22  19 - 32 mEq/L    Glucose, Bld 144 (*) 70 - 99 mg/dL    BUN 13  6 - 23 mg/dL    Creatinine, Ser 9.14  0.50 - 1.35 mg/dL    Calcium 9.4  8.4 - 78.2 mg/dL    Total Protein 8.4 (*) 6.0 - 8.3 g/dL    Albumin 4.3  3.5 - 5.2 g/dL     AST 44 (*) 0 - 37 U/L    ALT 32  0 - 53 U/L    Alkaline Phosphatase 114  39 - 117 U/L    Total Bilirubin 1.0  0.3 - 1.2 mg/dL    GFR calc non Af Amer >90  >90 mL/min    GFR calc Af Amer >90  >90 mL/min   ETHANOL     Status: Normal   Collection Time   09/07/12  5:35 AM      Component Value Range Comment   Alcohol, Ethyl (B) <11  0 - 11 mg/dL   ACETAMINOPHEN LEVEL     Status: Abnormal   Collection Time   09/07/12  5:35 AM      Component Value Range Comment   Acetaminophen (Tylenol), Serum 51.5 (*) 10 - 30 ug/mL   SALICYLATE LEVEL     Status: Abnormal   Collection Time   09/07/12  5:35 AM      Component Value Range Comment   Salicylate Lvl <2.0 (*) 2.8 - 20.0 mg/dL   MAGNESIUM     Status: Normal   Collection Time   09/07/12  5:35 AM      Component Value Range Comment   Magnesium 1.8  1.5 - 2.5 mg/dL   GLUCOSE, CAPILLARY     Status: Abnormal   Collection Time   09/07/12  6:51 AM      Component Value Range Comment   Glucose-Capillary 161 (*) 70 - 99 mg/dL    Comment 1 Notify RN      Comment 2 Documented in Chart     URINE RAPID DRUG SCREEN (HOSP PERFORMED)     Status: Normal   Collection Time   09/07/12  9:47 AM      Component Value Range Comment   Opiates NONE DETECTED  NONE DETECTED    Cocaine NONE DETECTED  NONE DETECTED    Benzodiazepines NONE DETECTED  NONE DETECTED    Amphetamines NONE DETECTED  NONE DETECTED    Tetrahydrocannabinol NONE DETECTED  NONE DETECTED    Barbiturates NONE DETECTED  NONE DETECTED   PROTIME-INR     Status: Normal  Collection Time   09/07/12 10:00 AM      Component Value Range Comment   Prothrombin Time 15.2  11.6 - 15.2 seconds    INR 1.18  0.00 - 1.49     No results found.  ROS Blood pressure 200/90, pulse 72, temperature 98.1 F (36.7 C), temperature source Oral, resp. rate 20, weight 117.935 kg (260 lb), SpO2 97.00%. Physical Exam   alert on bed  Mental Status Examination/Evaluation:  Appearance: on bed  Eye Contact::  Good  Speech: normal  Volume: Normal  Mood: depressed  Affect: ristricted  Thought Process: organized  Orientation: Full  Thought Content: NO AVH  Suicidal Thoughts: No  Homicidal Thoughts: no  Memory: Recent; Poor  Judgement: Impaired  Insight: Lacking  Psychomotor Activity: Normal  Concentration: Fair  Recall: Fair  Akathisia: No  Assessment:   AXIS I: Alcohol Dep, Adjustment d/o with mixed emotions, r/o Depressive d/o nos  AXIS II: Deferred  AXIS III: see mdical hx ? ?  ? ?  ?  ? ?  ?  ? ?  ?  ? ?  ?  ?  AXIS IV: relationship issues, financial issues  AXIS V: 30  ?  Treatment Plan/Recommendations:  1. will recommend psy in pt for safety, further evaluation and treatment after medical clearance  2. Psy will continue to follow and will consider meds later   Wonda Cerise 09/07/2012, 2:57 PM

## 2012-09-07 NOTE — ED Provider Notes (Signed)
History     CSN: [id number]  Arrival date & time 09/07/12  3086   First MD Initiated Contact with Patient 09/07/12 [phone]      Chief Complaint  Patient presents with  . Medical Clearance  . Drug Overdose     The history is provided by the patient and a friend.   The patient reports taking 98 x 500 mg Tylenol pills last night at approximately 5:30 PM.  He did this in attempt to kill himself.  The patient regularly binges on alcohol.  He's been on a 3-4 day alcohol binge as of recently.  He called his friend who recommended that he presented emergency department for evaluation.  He vomited approximately one hour after taking the Tylenol however he did not vomit any pill fragments.  He reports she's vomited 5 or 6 times since then.  He denies abdominal pain.  No other complaints.  He has a history of hypertension.  He otherwise without complaint. Past Medical History  Diagnosis Date  . Hypertension   . Sleep apnea     pt states he is on bipap at night for sleep apnea    Past Surgical History  Procedure Date  . Tonsillectomy     as a child    No family history on file.  History  Substance Use Topics  . Smoking status: Current Every Day Smoker -- 0.8 packs/day    Types: Cigarettes  . Smokeless tobacco: Not on file  . Alcohol Use: No     quit drinking       Review of Systems  All other systems reviewed and are negative.    Allergies  Review of patient's allergies indicates no known allergies.  Home Medications   Current Outpatient Rx  Name Route Sig Dispense Refill  . VITAMIN B12 PO Oral Take 1 tablet by mouth daily.     Marland Kitchen HYDROCHLOROTHIAZIDE 25 MG PO TABS Oral Take 1 tablet by mouth Daily.    Marland Kitchen LISINOPRIL 40 MG PO TABS Oral Take 1 tablet by mouth Daily.    Marland Kitchen METOPROLOL SUCCINATE ER 50 MG PO TB24 Oral Take 1 tablet by mouth Daily.    . ADULT MULTIVITAMIN W/MINERALS CH Oral Take 1 tablet by mouth daily.     Marland Kitchen VITAMIN B-6 PO Oral Take 1 tablet by mouth daily.     Marland Kitchen  VITAMIN B-1 PO Oral Take 1 tablet by mouth daily.       BP 213/83  Pulse 81  Temp 98.1 F (36.7 C) (Oral)  Resp 18  Wt 260 lb (117.935 kg)  SpO2 96%  Physical Exam  Nursing note and vitals reviewed. Constitutional: He is oriented to person, place, and time. He appears well-developed and well-nourished.  HENT:  Head: Normocephalic and atraumatic.  Eyes: EOM are normal.  Neck: Normal range of motion.  Cardiovascular: Normal rate, regular rhythm, normal heart sounds and intact distal pulses.   Pulmonary/Chest: Effort normal and breath sounds normal. No respiratory distress.  Abdominal: Soft. He exhibits no distension. There is no tenderness.  Musculoskeletal: Normal range of motion.  Neurological: He is alert and oriented to person, place, and time.  Skin: Skin is warm and dry.  Psychiatric: He has a normal mood and affect. Judgment normal.    ED Course  Procedures (including critical care time)  Labs Reviewed  CBC - Abnormal; Notable for the following:    RBC 6.66 (*)     MCV 60.7 (*)     [facility]  20.6 (*)     RDW 15.6 (*)     All other components within normal limits  COMPREHENSIVE METABOLIC PANEL - Abnormal; Notable for the following:    Potassium 2.9 (*)     Glucose, Bld 144 (*)     Total Protein 8.4 (*)     AST 44 (*)     All other components within normal limits  ACETAMINOPHEN LEVEL - Abnormal; Notable for the following:    Acetaminophen (Tylenol), Serum 51.5 (*)     All other components within normal limits  SALICYLATE LEVEL - Abnormal; Notable for the following:    Salicylate Lvl <2.0 (*)     All other components within normal limits  GLUCOSE, CAPILLARY - Abnormal; Notable for the following:    Glucose-Capillary 161 (*)     All other components within normal limits  ETHANOL  URINE RAPID DRUG SCREEN (HOSP PERFORMED)  PROTIME-INR   No results found.   Date: 09/07/2012  Rate: 81  Rhythm: normal sinus rhythm  QRS Axis: normal  Intervals: normal  ST/T Wave  abnormalities: Nonspecific ST and T wave changes  Conduction Disutrbances: none  Narrative Interpretation:   Old EKG Reviewed: No significant changes noted     1. Acetaminophen overdose       MDM  The patient was initiated on N-acetylcysteine the IV for obvious significant Tylenol ingestion.  This was started on his arrival to the emergency department.  His Tylenol level at 12 hours is 51.5 which puts him above the treatment line.  The patient will continue on IV Acetadote.  I will omit the patient to the hospitalist service.  His LFTs at this time are normal.  INR still pending.  He also need to be monitored closely for alcohol withdrawal.  He is on the CIWA protocol        Lyanne Co, MD 09/07/12 [phone]

## 2012-09-07 NOTE — ED Notes (Signed)
Pt alert, arrives from home, c/o Taking (98) Tylenol ES last PM around 1800, pt states intentional, resp even unlabored, skin pwd

## 2012-09-08 ENCOUNTER — Encounter (HOSPITAL_COMMUNITY): Payer: Self-pay | Admitting: *Deleted

## 2012-09-08 ENCOUNTER — Inpatient Hospital Stay (HOSPITAL_COMMUNITY): Payer: BC Managed Care – PPO

## 2012-09-08 DIAGNOSIS — R7401 Elevation of levels of liver transaminase levels: Secondary | ICD-10-CM | POA: Clinically undetermined

## 2012-09-08 DIAGNOSIS — R197 Diarrhea, unspecified: Secondary | ICD-10-CM | POA: Diagnosis present

## 2012-09-08 LAB — TROPONIN I
Troponin I: <0.3 ng/mL (ref <0.30)
Troponin I: <0.3 ng/mL (ref <0.30)

## 2012-09-08 LAB — URINALYSIS, ROUTINE W REFLEX MICROSCOPIC
Bilirubin Urine: NEGATIVE
Nitrite: NEGATIVE
Protein, ur: 100 mg/dL — AB
Urobilinogen, UA: 0.2 mg/dL (ref 0.0–1.0)

## 2012-09-08 LAB — APTT: aPTT: 39 seconds — ABNORMAL HIGH (ref 24–37)

## 2012-09-08 LAB — COMPREHENSIVE METABOLIC PANEL
Alkaline Phosphatase: 80 U/L (ref 39–117)
BUN: 12 mg/dL (ref 6–23)
Chloride: 99 mEq/L (ref 96–112)
Creatinine, Ser: 0.98 mg/dL (ref 0.50–1.35)
GFR calc Af Amer: >90 mL/min (ref >90)
Glucose, Bld: 140 mg/dL — ABNORMAL HIGH (ref 70–99)
Potassium: 3 mEq/L — ABNORMAL LOW (ref 3.5–5.1)
Total Bilirubin: 1.4 mg/dL — ABNORMAL HIGH (ref 0.3–1.2)
Total Protein: 6.8 g/dL (ref 6.0–8.3)

## 2012-09-08 LAB — CBC
HCT: 36.6 % — ABNORMAL LOW (ref 39.0–52.0)
MCV: 60.8 fL — ABNORMAL LOW (ref 78.0–100.0)
RBC: 6.02 MIL/uL — ABNORMAL HIGH (ref 4.22–5.81)
RDW: 15.3 % (ref 11.5–15.5)
WBC: 10.6 10*3/uL — ABNORMAL HIGH (ref 4.0–10.5)

## 2012-09-08 LAB — URINE MICROSCOPIC-ADD ON

## 2012-09-08 LAB — MAGNESIUM: Magnesium: 1.6 mg/dL (ref 1.5–2.5)

## 2012-09-08 LAB — PROTIME-INR: INR: 1.27 (ref 0.00–1.49)

## 2012-09-08 LAB — ACETAMINOPHEN LEVEL: Acetaminophen (Tylenol), Serum: <15 ug/mL (ref 10–30)

## 2012-09-08 MED ORDER — LOPERAMIDE HCL 2 MG PO CAPS
4.0000 mg | ORAL_CAPSULE | ORAL | Status: DC | PRN
  Administered 2012-09-08 – 2012-09-09 (×2): 4 mg via ORAL
  Filled 2012-09-08: qty 2
  Filled 2012-09-08: qty 1

## 2012-09-08 MED ORDER — PNEUMOCOCCAL VAC POLYVALENT 25 MCG/0.5ML IJ INJ: 0.5000 mL | INJECTION | INTRAMUSCULAR | Status: DC

## 2012-09-08 MED ORDER — POTASSIUM PHOSPHATE DIBASIC 3 MMOLE/ML IV SOLN
25.0000 mmol | Freq: Once | INTRAVENOUS | Status: AC
  Administered 2012-09-08: 25 mmol via INTRAVENOUS
  Filled 2012-09-08: qty 8.33

## 2012-09-08 MED ORDER — METOPROLOL SUCCINATE ER 50 MG PO TB24
75.0000 mg | ORAL_TABLET | Freq: Every day | ORAL | Status: DC
  Administered 2012-09-09 – 2012-09-10 (×2): 75 mg via ORAL
  Filled 2012-09-08 (×2): qty 1

## 2012-09-08 MED ORDER — LOPERAMIDE HCL 2 MG PO CAPS
2.0000 mg | ORAL_CAPSULE | ORAL | Status: DC | PRN
  Filled 2012-09-08: qty 1

## 2012-09-08 MED ORDER — INFLUENZA VIRUS VACC SPLIT PF IM SUSP
0.5000 mL | Freq: Once | INTRAMUSCULAR | Status: AC
  Administered 2012-09-08: 0.5 mL via INTRAMUSCULAR
  Filled 2012-09-08: qty 0.5

## 2012-09-08 MED ORDER — INFLUENZA VIRUS VACC SPLIT PF IM SUSP: 0.5000 mL | INTRAMUSCULAR | Status: DC

## 2012-09-08 MED ORDER — PNEUMOCOCCAL VAC POLYVALENT 25 MCG/0.5ML IJ INJ
0.5000 mL | INJECTION | Freq: Once | INTRAMUSCULAR | Status: AC
  Administered 2012-09-08: 0.5 mL via INTRAMUSCULAR
  Filled 2012-09-08: qty 0.5

## 2012-09-08 MED ORDER — METOPROLOL SUCCINATE ER 25 MG PO TB24
25.0000 mg | ORAL_TABLET | Freq: Once | ORAL | Status: AC
  Administered 2012-09-08: 25 mg via ORAL
  Filled 2012-09-08: qty 1

## 2012-09-08 MED ORDER — MAGNESIUM SULFATE 50 % IJ SOLN: 3.0000 g | Freq: Once | INTRAVENOUS | Status: DC

## 2012-09-08 MED ORDER — DEXTROSE 5 % IV SOLN
INTRAVENOUS | Status: DC
  Filled 2012-09-08 (×2): qty 500

## 2012-09-08 MED ORDER — POTASSIUM CHLORIDE CRYS ER 20 MEQ PO TBCR
40.0000 meq | EXTENDED_RELEASE_TABLET | ORAL | Status: AC
  Administered 2012-09-08 (×2): 40 meq via ORAL
  Filled 2012-09-08 (×2): qty 2

## 2012-09-08 MED ORDER — DEXTROSE 5 % IV SOLN
INTRAVENOUS | Status: DC
  Administered 2012-09-08: 22:00:00 via INTRAVENOUS
  Filled 2012-09-08: qty 500

## 2012-09-08 MED ORDER — MAGNESIUM SULFATE 50 % IJ SOLN
3.0000 g | Freq: Once | INTRAVENOUS | Status: AC
  Administered 2012-09-08: 3 g via INTRAVENOUS
  Filled 2012-09-08: qty 6

## 2012-09-08 MED ORDER — LOPERAMIDE HCL 2 MG PO CAPS
4.0000 mg | ORAL_CAPSULE | Freq: Once | ORAL | Status: AC
  Administered 2012-09-08: 4 mg via ORAL
  Filled 2012-09-08: qty 2

## 2012-09-08 NOTE — Progress Notes (Signed)
MEDICATION RELATED CONSULT NOTE - INITIAL   Pharmacy Consult for  Phos replacement Indication: hypophos  No Known Allergies  Patient Measurements: Weight: 260 lb (117.935 kg) Adjusted Body Weight: 91  Vital Signs: Temp: 98.7 F (37.1 C) (09/16 0400) Temp src: Oral (09/16 0400) BP: 136/71 mmHg (09/16 0400) Pulse Rate: 68  (09/16 0400) Intake/Output from previous day: 09/15 0701 - 09/16 0700 In: 2682.5 [P.O.:1320; I.V.:962.5; IV Piggyback:400] Out: 650 [Urine:650] Intake/Output from this shift:    Labs:  Basename 09/08/12 0558 09/07/12 2324 09/07/12 2140 09/07/12 0535  WBC 10.6* -- -- 9.0  HGB 12.4* -- -- 13.7  HCT 36.6* -- -- 40.4  PLT 160 -- -- 217  APTT 39* 36 -- --  CREATININE 0.98 -- 0.90 0.84  LABCREA -- -- -- --  CREATININE 0.98 -- 0.90 0.84  CREAT24HRUR -- -- -- --  MG -- -- -- 1.8  PHOS -- 1.5* -- --  ALBUMIN 3.3* -- 3.4*3.4* 4.3  PROT 6.8 -- 7.07.1 8.4*  ALBUMIN 3.3* -- 3.4*3.4* 4.3  AST 155* -- 107*107* 44*  ALT 103* -- 62*62* 32  ALKPHOS 80 -- 8483 114  BILITOT 1.4* -- 1.4*1.4* 1.0  BILIDIR -- -- 0.3 --  IBILI -- -- 1.1* --   The CrCl is unknown because both a height and weight (above a minimum accepted value) are required for this calculation.   Microbiology: Recent Results (from the past 720 hour(s))  MRSA PCR SCREENING     Status: Normal   Collection Time   09/07/12 10:33 PM      Component Value Range Status Comment   MRSA by PCR NEGATIVE  NEGATIVE Final     Medical History: Past Medical History  Diagnosis Date  . Hypertension   . Sleep apnea     pt states he is on bipap at night for sleep apnea  . HTN (hypertension) 09/07/2012  . Alcohol dependence 09/07/2012  . Gastritis 09/07/2012    Per EGD 04/2011  . Esophagitis 09/07/2012    Per EGD 04/2011  . Thrombocytopenia 09/07/2012    Hx of in past.    Medications:  Scheduled:    . sodium chloride   Intravenous STAT  . folic acid  1 mg Oral Daily  . hydrochlorothiazide  25 mg  Oral Once  . hydrochlorothiazide  25 mg Oral Daily  . labetalol  10 mg Intravenous Once  . lisinopril  40 mg Oral Once  . lisinopril  40 mg Oral Daily  . LORazepam  0-4 mg Oral Q6H   Followed by  . LORazepam  0-4 mg Oral Q12H  . metoprolol succinate  50 mg Oral Daily  . metoprolol tartrate  50 mg Oral Once  . multivitamin with minerals  1 tablet Oral Daily  . nicotine  21 mg Transdermal Once  . nicotine  21 mg Transdermal Daily  . pantoprazole  40 mg Oral Q1200  . potassium chloride  10 mEq Intravenous Once  . potassium chloride  10 mEq Intravenous Q1 Hr x 4  . potassium chloride  40 mEq Oral Q4H  . potassium chloride  40 mEq Oral Once  . potassium chloride  40 mEq Oral Q4H  . potassium phosphate IVPB (mmol)  25 mmol Intravenous Once  . vitamin B-6  25 mg Oral Daily  . sodium chloride  3 mL Intravenous Q12H  . thiamine  100 mg Oral Daily   Or  . thiamine  100 mg Intravenous Daily  . vitamin B-12  100  mcg Oral Daily  . DISCONTD: hydrochlorothiazide  25 mg Oral Once  . DISCONTD: metoprolol tartrate  25 mg Oral BID  . DISCONTD: multivitamin with minerals  1 tablet Oral Daily   Infusions:    . sodium chloride 1,000 mL (09/08/12 0718)  . acetylcysteine 14.996 mg/kg/hr (09/08/12 0530)  . labetalol (NORMODYNE) infusion 0.5 mg/min (09/08/12 0718)  . DISCONTD: sodium chloride      Assessment:  56 YOF admitted 9/15 pm w/APAP OD  Phos was low last night (1.5), pharmacy asked to replace  Wt 118kg, pt 70in tall. IBW 73, adjusted wt 91kg; 0.82mmol x 91kg =8mmol. Pt has low K also so will opt for KPhos salt form. Pt also receiving K replacement per MD  Scr wnl  Goal of Therapy:  Phos wnl  Plan:  Kphos IV x 1, f/u am Phos and K  Gwen Her PharmD  [phone] 09/08/2012 8:00 AM

## 2012-09-08 NOTE — Progress Notes (Signed)
CARE MANAGEMENT NOTE 09/08/2012  Patient:  Jeremy Douglas,Jeremy Douglas   Account Number:  [id number]  Date Initiated:  09/08/2012  Documentation initiated by:  Aijah Lattner  Subjective/Objective Assessment:   patient with overdose of tylenol     Action/Plan:   md recommends inpt psych   Anticipated DC Date:  09/11/2012   Anticipated DC Plan:  [facility]  In-house referral  Clinical Social Worker      DC Planning Services  NA      [facility] Choice  NA   Choice offered to / List presented to:  NA   DME arranged  NA      DME agency  NA     HH arranged  NA      HH agency  NA   Status of service:  In process, will continue to follow Medicare Important Message given?  NA - LOS <3 / Initial given by admissions (If response is "NO", the following Medicare IM given date fields will be blank) Date Medicare IM given:   Date Additional Medicare IM given:    Discharge Disposition:    Per UR Regulation:  Reviewed for med. necessity/level of care/duration of stay  If discussed at Long Length of Stay Meetings, dates discussed:    Comments:  [id number] Earlene Plater, RN, BSN, CCM: CHART REVIEWED AND UPDATED. NO DISCHARGE NEEDS PRESENT AT THIS TIME. CASE MANAGEMENT [phone]

## 2012-09-08 NOTE — Progress Notes (Signed)
Received call from poison control regarding pt's am labs.  Advised to continue mucomyst for 24 more hours and recheck labs in the am Ashley Royalty, RN

## 2012-09-08 NOTE — Progress Notes (Signed)
0200-Pt up from ED with home CPAP.  Pt currently wearing and is tolerating well at this time. RT to monitor and assess as needed

## 2012-09-08 NOTE — Progress Notes (Signed)
Clinical Social Work Department CLINICAL SOCIAL WORK PSYCHIATRY SERVICE LINE ASSESSMENT 09/08/2012  Patient:  Jeremy Douglas  Account:  [id number]  Admit Date:  09/07/2012  Clinical Social Worker:  Doroteo Glassman  Date/Time:  09/08/2012 01:55 PM Referred by:  Physician  Date referred:  09/08/2012 Reason for Referral  Behavioral Health Issues   Presenting Symptoms/Problems (In the person's/family's own words):   Pt overdosed on 98 500mg  pills of Tylenol.    Abuse/Neglect/Trauma Comments:   Psychiatric History (check all that apply)  Outpatient treatment   Psychiatric medications:  Current Mental Health Hospitalizations/Previous Mental Health History:   Pt reports seeing a counselor by hx for SA   Current provider:   none   Place and Date:   Current Medications:   see H&P   Previous Impatient Admission/Date/Reason:   denies   Emotional Health / Current Symptoms    Suicide/Self Harm  None reported   Suicide attempt in the past:   Other harmful behavior:   Although Pt denies current SI, Pt overdosed on 98 500mg  of Tylenol yesterday.   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   Substance Abuse/Use  Current substance use   SBIRT completed (please refer for detailed history):    Self-reported substance use:   Urinary Drug Screen Completed:  Y Alcohol  level:    Environmental/Housing/Living Arrangement  Stable housing   Who is in the home:   None   Emergency contact:   Patient's Strengths and Goals (patient's own words):   Clinical Social Worker's Interpretive Summary:   Pt's ex-mother-in-law and ex-sister-in-law were present. Pt told CSW that it's ok to speak in front of them.    Pt reports that he overdosed due to significant stress, mostly related to his business.    Pt states that he is a binge drinker and will go for several days drinking a pint per day and then he'll go for a period and not drink.    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  He states that he knows that the business waxes-and-wanes and that he's ready to get out of the hospital to return to work. He states that he doesn't want to let work linger.    CSW discuss with Pt's psych MD's recommendation for inpt tx.  Pt does not want to go to inpt tx and is willing to go to outpt tx.  Pt will discuss this with psych MD today.    CSW thanked Pt for his time.    Psych MD to see Pt today.   Disposition:  Recommend Psych CSW continuing to support while in hospital  CSW to continue to follow.  Providence Crosby, LCSWA Clinical Social Work [phone]

## 2012-09-08 NOTE — Progress Notes (Signed)
Nutrition Brief Note  Patient identified on the Malnutrition Screening Tool (MST) report for unintentional weight loss, generating a score of 3.   There is no height on file to calculate BMI. Patient reported a height of 5'11".  Pt meets criteria for Obesity class 2 with a BMI of 36.2 kg/m^2 based on current BMI.   Current diet order is heart healthy, patient is consuming approximately 65-75% of meals at this time. Labs and medications reviewed.   Patient reported he was eating well up until the past few days. He reported he ate well at breakfast meal today. Patient was without any nutrition questions or concerns. Encouraged adequate PO intake.   No nutrition interventions warranted at this time. If nutrition issues arise, please consult RD.   Jeremy Douglas [facility] [phone]

## 2012-09-08 NOTE — Progress Notes (Signed)
[facility] PROGRESS NOTE  Jeremy BETTEN ZOX:[id number] DOB: November 19, 1956 DOA: 09/07/2012 PCP: Darrow Bussing, MD  Assessment/Plan: Principal Problem:  *Tylenol overdose Active Problems:  TOBACCO USER  SLEEP APNEA  Suicidal overdose  Hypertensive urgency  HTN (hypertension)  Hypokalemia  Alcohol dependence  Thrombocytopenia  Chest pain  Hypophosphatemia  Transaminitis  #1 Tylenol overdose Secondary to suicidal ideation and probable depression. Patient's Tylenol levels are now less than 15. PT/INR is within normal limits. Patient's liver enzymes are rising and currently his AST is 155 from 107 from 44. Patient's ALT is now 103 from 62 from 32. Will continue Mucomyst IV for another 24 hours and repeat labs in the morning. Poison control has been following along. If patient's LFTs continued to rise or his coags increase her Tylenol level is still not less than 15 will need another 24 hours of IV Mucomyst.  #2 transaminitis Secondary to problem #1.  #3 suicidal overdose/suicidal ideation Secondary to multiple stresses in patient's life including work and family and relationship. . Patient also may have some component of depression. Patient has been seen by psychiatry and I recommended inpatient psychiatric treatment once patient is medically stable.  #4 hypokalemia/hypophosphatemia We'll keep magnesium level equal 12 greater than 2. Replete potassium and phosphorus. We'll follow in the a.m.  #5 hypertensive urgency Patient is still on a labetalol drip. We'll try to wean the labetalol drip. Continue lisinopril 40 mg daily. Continue HCTZ at 25 mg daily. Will increase beta blocker of metoprolol to 75 mg daily.  #6 atypical chest pain Troponins are negative x3. Patient is currently chest pain-free. 2-D echo is pending. Will follow.  #7 chronic alcohol dependence Continue the Ativan see well protocol.  #8 history of thrombocytopenia Was felt to be secondary to alcohol abuse.  Platelets are stable. Follow.  #9 diarrhea C. difficile PCR is negative. We'll place on Imodium as needed.  #10 prophylaxis PPI for GI prophylaxis. SCDs for DVT prophylaxis.  Code Status: FULL Family Communication: UPDATed patient at bedside. Disposition Plan: Will likely need inpatient psychiatry once medically stable   Brief narrative: KOHLSON Douglas is a 56 y.o. male with past medical history of hypertension, obstructive sleep apnea on CPAP machine at home, chronic alcohol dependence, tobacco abuse who presents to the ED with a Tylenol overdose. Patient states has had a lot of stress going on in his life including work family and his relationship and a such as been somewhat depressed and has been drinking as well and did not want to live. Patient states that last week was his birthday and he essentially said in his apartment and was drinking on his birthday by himself. Patient states that around 6 PM one day prior to admission he took 98 Tylenol 500 mg pills in an attempt to kill himself. Patient stated that he had about 6-7 episodes of a emesis with no bleeding, last episode was at 2 AM on the day of admission. Patient stated that around 4 AM on the day of admission he realized that taken his lactulose not the right thing to do and that subsequently ended up And his friend who brought him to the ED. Patient denies any fever, no dysuria, no constipation, no weakness. Patient states has a little bit of a headache. Patient also endorses a nonproductive cough, patient's it has some episodes of feeling hot and feeling cold, patient endorsed a dull midsternal left-sided chest pain which was nonradiating lasting about 2-3 minutes when he was laying down on the gurney  in the ED which subsequently resolved after sitting up. Patient denies any palpitations. Patient also did have some shortness of breath associated with this the chest pain which has since resolved. Patient does endorse an episode of  diarrhea in the emergency room. Patient was seen in the ED CBC which was done was unremarkable. A comprehensive metabolic profile which was done did have a potassium of 2.9 a AST of 44 ALT of 30 to protein of 8.4 otherwise was within normal limits. A Tylenol level which was obtained was 51.5. Salicylate level which was obtained was less than 2. EKG which was done did show a sinus rhythm with incomplete right bundle branch block which is old. Coagulation panel is pending at this time. Patient was also noted to have systolic blood pressure in the 200s. Patient was started on IV Mucomyst in the ED. We were called to admit the patient for further evaluation and management   Consultants:  Psychiatry: Dr. Theotis Barrio  Procedures:  Chest x-ray 09/08/2012  Antibiotics:  None  HPI/Subjective: Patient denies any chest pain. No shortness of breath. Patient as stated had multiple loose stools last night and this morning. No other complaints.  Objective: Filed Vitals:   09/08/12 0200 09/08/12 0300 09/08/12 0400 09/08/12 0700  BP: 159/60 172/72 136/71 163/61  Pulse: 71 80 68 70  Temp:   98.7 F (37.1 C)   TempSrc:   Oral   Resp: 21 23 18 24   Weight:      SpO2: 95% 94% 95% 96%    Intake/Output Summary (Last 24 hours) at 09/08/12 1124 Last data filed at 09/08/12 0700  Gross per 24 hour  Intake 2922.5 ml  Output    650 ml  Net 2272.5 ml   Filed Weights   09/07/12 0515  Weight: 117.935 kg (260 lb)    Exam:   General:  NAD  Cardiovascular: RRR  Respiratory: CTAB  Abdomen: SOFT/NT/ND/+BS  Data Reviewed: Basic Metabolic Panel:  Lab 09/08/12 1610 09/08/12 0550 09/07/12 2324 09/07/12 2140 09/07/12 0535  NA 135 -- -- 136 139  K 3.0* -- -- 2.4* 2.9*  CL 99 -- -- 97 99  CO2 22 -- -- 25 22  GLUCOSE 140* -- -- 134* 144*  BUN 12 -- -- 11 13  CREATININE 0.98 -- -- 0.90 0.84  CALCIUM 9.0 -- -- 9.2 9.4  MG -- 1.6 -- -- 1.8  PHOS -- -- 1.5* -- --   Liver Function Tests:  Lab 09/08/12  0558 09/07/12 2140 09/07/12 0535  AST 155* 107*107* 44*  ALT 103* 62*62* 32  ALKPHOS 80 8483 114  BILITOT 1.4* 1.4*1.4* 1.0  PROT 6.8 7.07.1 8.4*  ALBUMIN 3.3* 3.4*3.4* 4.3   No results found for this basename: LIPASE:5,AMYLASE:5 in the last 168 hours No results found for this basename: AMMONIA:5 in the last 168 hours CBC:  Lab 09/08/12 0558 09/07/12 0535  WBC 10.6* 9.0  NEUTROABS -- --  HGB 12.4* 13.7  HCT 36.6* 40.4  MCV 60.8* 60.7*  PLT 160 217   Cardiac Enzymes:  Lab 09/08/12 0558 09/07/12 2324  CKTOTAL -- --  CKMB -- --  CKMBINDEX -- --  TROPONINI <0.30 <0.30   BNP (last 3 results) No results found for this basename: PROBNP:3 in the last 8760 hours CBG:  Lab 09/07/12 0651  GLUCAP 161*    Recent Results (from the past 240 hour(s))  MRSA PCR SCREENING     Status: Normal   Collection Time   09/07/12 10:33 PM  Component Value Range Status Comment   MRSA by PCR NEGATIVE  NEGATIVE Final   CLOSTRIDIUM DIFFICILE BY PCR     Status: Normal   Collection Time   09/08/12  1:36 AM      Component Value Range Status Comment   C difficile by pcr NEGATIVE  NEGATIVE Final      Studies: Portable Chest 1 View  09/08/2012  *RADIOLOGY REPORT*  Clinical Data: Emesis.  Shortness of breath.  PORTABLE CHEST - 1 VIEW  Comparison: PA and lateral chest 04/26/2011 and CT chest 02/08/2011.  Findings: The lungs are clear.  No pneumothorax or pleural fluid. Heart size normal.  IMPRESSION: No acute disease.   Original Report Authenticated By: Bernadene Bell. D'ALESSIO, M.D.     Scheduled Meds:   . sodium chloride   Intravenous STAT  . folic acid  1 mg Oral Daily  . hydrochlorothiazide  25 mg Oral Daily  . influenza  inactive virus vaccine  0.5 mL Intramuscular Once  . lisinopril  40 mg Oral Daily  . loperamide  4 mg Oral Once  . LORazepam  0-4 mg Oral Q6H   Followed by  . LORazepam  0-4 mg Oral Q12H  . magnesium sulfate LVP 250-500 ml  3 g Intravenous Once  . metoprolol  succinate  75 mg Oral Daily  . multivitamin with minerals  1 tablet Oral Daily  . nicotine  21 mg Transdermal Once  . nicotine  21 mg Transdermal Daily  . pantoprazole  40 mg Oral Q1200  . pneumococcal 23 valent vaccine  0.5 mL Intramuscular Once  . potassium chloride  10 mEq Intravenous Q1 Hr x 4  . potassium chloride  40 mEq Oral Q4H  . potassium chloride  40 mEq Oral Once  . potassium chloride  40 mEq Oral Q4H  . potassium phosphate IVPB (mmol)  25 mmol Intravenous Once  . vitamin B-6  25 mg Oral Daily  . sodium chloride  3 mL Intravenous Q12H  . thiamine  100 mg Oral Daily   Or  . thiamine  100 mg Intravenous Daily  . vitamin B-12  100 mcg Oral Daily  . DISCONTD: influenza  inactive virus vaccine  0.5 mL Intramuscular Tomorrow-1000  . DISCONTD: magnesium sulfate 1 - 4 g bolus IVPB  3 g Intravenous Once  . DISCONTD: metoprolol succinate  50 mg Oral Daily  . DISCONTD: metoprolol tartrate  25 mg Oral BID  . DISCONTD: multivitamin with minerals  1 tablet Oral Daily  . DISCONTD: pneumococcal 23 valent vaccine  0.5 mL Intramuscular Tomorrow-1000  . DISCONTD: pneumococcal 23 valent vaccine  0.5 mL Intramuscular Tomorrow-1000  . DISCONTD: pneumococcal 23 valent vaccine  0.5 mL Intramuscular Tomorrow-1000   Continuous Infusions:   . sodium chloride 1,000 mL (09/08/12 0718)  . acetylcysteine 14.996 mg/kg/hr (09/08/12 0530)  . labetalol (NORMODYNE) infusion 1 mg/min (09/08/12 1019)  . DISCONTD: sodium chloride      Principal Problem:  *Tylenol overdose Active Problems:  TOBACCO USER  SLEEP APNEA  Suicidal overdose  Hypertensive urgency  HTN (hypertension)  Hypokalemia  Alcohol dependence  Thrombocytopenia  Chest pain  Hypophosphatemia  Transaminitis    Time spent: 40 mins    [facility]  [facility] Pager [phone]. If 8PM-8AM, please contact night-coverage at [email or web address], password [facility] 09/08/2012, 11:24 AM  LOS: 1 day

## 2012-09-08 NOTE — Progress Notes (Signed)
  Echocardiogram 2D Echocardiogram has been performed.  Jeremy Douglas 09/08/2012, 10:39 AM

## 2012-09-08 NOTE — Progress Notes (Signed)
Pt has his home cpap set & can place on himself when ready.  Jacqulynn Cadet RRT

## 2012-09-09 DIAGNOSIS — D649 Anemia, unspecified: Secondary | ICD-10-CM | POA: Diagnosis not present

## 2012-09-09 DIAGNOSIS — F32A Depression, unspecified: Secondary | ICD-10-CM | POA: Diagnosis present

## 2012-09-09 DIAGNOSIS — R7401 Elevation of levels of liver transaminase levels: Secondary | ICD-10-CM

## 2012-09-09 DIAGNOSIS — F329 Major depressive disorder, single episode, unspecified: Secondary | ICD-10-CM | POA: Diagnosis present

## 2012-09-09 LAB — URINE CULTURE: Culture: NO GROWTH

## 2012-09-09 LAB — CBC WITH DIFFERENTIAL/PLATELET
Basophils Absolute: 0.1 10*3/uL (ref 0.0–0.1)
Basophils Relative: 1 % (ref 0–1)
Eosinophils Absolute: 0.2 10*3/uL (ref 0.0–0.7)
HCT: 31 % — ABNORMAL LOW (ref 39.0–52.0)
Hemoglobin: 10.4 g/dL — ABNORMAL LOW (ref 13.0–17.0)
Lymphs Abs: 1.6 10*3/uL (ref 0.7–4.0)
MCHC: 33.5 g/dL (ref 30.0–36.0)
MCV: 60.9 fL — ABNORMAL LOW (ref 78.0–100.0)
Neutro Abs: 6.1 10*3/uL (ref 1.7–7.7)
RDW: 14.9 % (ref 11.5–15.5)

## 2012-09-09 LAB — IRON AND TIBC: Iron: 248 ug/dL — ABNORMAL HIGH (ref 42–135)

## 2012-09-09 LAB — HEPATIC FUNCTION PANEL
ALT: 117 U/L — ABNORMAL HIGH (ref 0–53)
AST: 115 U/L — ABNORMAL HIGH (ref 0–37)
Albumin: 2.9 g/dL — ABNORMAL LOW (ref 3.5–5.2)
Alkaline Phosphatase: 67 U/L (ref 39–117)
Total Protein: 6.1 g/dL (ref 6.0–8.3)

## 2012-09-09 LAB — FERRITIN: Ferritin: 1412 ng/mL — ABNORMAL HIGH (ref 22–322)

## 2012-09-09 LAB — BASIC METABOLIC PANEL
CO2: 21 mEq/L (ref 19–32)
Chloride: 104 mEq/L (ref 96–112)
Potassium: 2.7 mEq/L — CL (ref 3.5–5.1)
Sodium: 136 mEq/L (ref 135–145)

## 2012-09-09 LAB — ACETAMINOPHEN LEVEL: Acetaminophen (Tylenol), Serum: <15 ug/mL (ref 10–30)

## 2012-09-09 LAB — PROTIME-INR: Prothrombin Time: 15.1 seconds (ref 11.6–15.2)

## 2012-09-09 LAB — MAGNESIUM: Magnesium: 1.9 mg/dL (ref 1.5–2.5)

## 2012-09-09 LAB — OCCULT BLOOD X 1 CARD TO LAB, STOOL: Fecal Occult Bld: POSITIVE

## 2012-09-09 LAB — VITAMIN B12: Vitamin B-12: 948 pg/mL — ABNORMAL HIGH (ref 211–911)

## 2012-09-09 LAB — PHOSPHORUS: Phosphorus: 2.6 mg/dL (ref 2.3–4.6)

## 2012-09-09 MED ORDER — POTASSIUM CHLORIDE 20 MEQ/15ML (10%) PO LIQD
40.0000 meq | Freq: Once | ORAL | Status: AC
  Administered 2012-09-09: 40 meq via ORAL
  Filled 2012-09-09: qty 30

## 2012-09-09 MED ORDER — POTASSIUM CHLORIDE CRYS ER 20 MEQ PO TBCR
40.0000 meq | EXTENDED_RELEASE_TABLET | ORAL | Status: AC
  Administered 2012-09-09 (×3): 40 meq via ORAL
  Filled 2012-09-09 (×3): qty 2

## 2012-09-09 MED ORDER — BIOTENE DRY MOUTH MT LIQD
15.0000 mL | Freq: Two times a day (BID) | OROMUCOSAL | Status: DC
  Administered 2012-09-09 – 2012-09-10 (×3): 15 mL via OROMUCOSAL

## 2012-09-09 MED ORDER — POTASSIUM CHLORIDE CRYS ER 20 MEQ PO TBCR
40.0000 meq | EXTENDED_RELEASE_TABLET | Freq: Once | ORAL | Status: AC
  Administered 2012-09-09: 40 meq via ORAL
  Filled 2012-09-09: qty 2

## 2012-09-09 MED ORDER — HYDRALAZINE HCL 20 MG/ML IJ SOLN
10.0000 mg | INTRAMUSCULAR | Status: DC | PRN
  Administered 2012-09-10: 10 mg via INTRAVENOUS
  Filled 2012-09-09: qty 1

## 2012-09-09 NOTE — Progress Notes (Signed)
Progress Note following consultation: Reason for Consult: Overdose with Tylenol  Referring Provider: Dr. 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  They continue to work together. He has been a binge drinker for some time. He met a Gfriend and says they recently separated due to stress. He has been a weekend drinker for years and recently this became worse. He says he had a birthday and it all hit him - stress about GF and working with ex-wife; which GF does not understand, delays in getting orders completed etc. He took 37 tylenol on his birthday. He called a friend and was brought to the ED.  DIAGNOSIS:  AXIS I  Bipolar Disorder, depressed most recent  alcohol dependence severe   AXIS II  Deffered   AXIS III  See medical notes.   AXIS IV  occupational problems, other psychosocial or environmental problems, problems related to social environment and problems with primary support group   AXIS V  51-60 moderate symptoms   Assessment/Plan: Patient is in the bed. He is eating a meal. His daughter is present. She is brought him some materials he requested. Patient states that he is feeling much better today. Again the topic of referral to inpatient psychiatric care is discussed. He continues to be encouraged to do this at this time. He expresses concern for the survival of his business but also acknowledges that the success of his business hinges on his health and well-being. He believes that inpatient psychiatric care is a priority.. Discussion with Dr. expresses intent to monitor his vitals another day after he is transferred to one of the inpatient florists. Disposition decisions will be made at that time, pending stabilized vital signs  RECOMMENDATION:  1. Patient is cognitively intact and has capacity to participate in his treatment plans.  2. Patient agrees he is not intending any suicidal attempt. Suggest discontinue sitter. 3.  Continue current medications unless contraindicated. 4. Suggest contact behavioral health hospital to recommend inpatient admission with dual diagnosis bipolar II and substance abuse/dependence. 5. Continue Ativan taper until vital signs stabilize and detox symptoms abate. 6. No further psychiatric needs identified. M.D. Psychiatrist signs off  Jeremy Nicklaus J. Ferol Luz, MD Psychiatrist  09/09/2012 4:48 PM

## 2012-09-09 NOTE — Progress Notes (Signed)
[facility] PROGRESS NOTE  Jeremy Douglas NGE:[id number] DOB: 02/09/1956 DOA: 09/07/2012 PCP: Darrow Bussing, MD  Assessment/Plan: Principal Problem:  *Tylenol overdose Active Problems:  TOBACCO USER  SLEEP APNEA  Suicidal overdose  Hypertensive urgency  HTN (hypertension)  Hypokalemia  Alcohol dependence  Thrombocytopenia  Chest pain  Hypophosphatemia  Transaminitis  Diarrhea  Anemia  #1 Tylenol overdose Secondary to suicidal ideation and probable depression. Patient's Tylenol levels are now less than 15. PT/INR is within normal limits and trending down.. Patient's liver enzymes are Trending back down and currently his AST is 115 from 155 from 107 from 44. Patient's ALT is now 117 from 103 from 62 from 32. Will continue d/c Mucomyst IV  and repeat labs in the morning. Poison control has been following along. If patient's LFTs continued to rise or his coags increase or Tylenol level is not less than 15 will need another 24 hours of IV Mucomyst. Follow. Supportive care.  #2 transaminitis Secondary to problem #1. LFTs trending down. Follow.   #3 suicidal overdose/suicidal ideation/Depression Secondary to multiple stresses in patient's life including work and family and relationship. . Patient also may have some component of depression. Patient has been seen by psychiatry and  recommended inpatient psychiatric treatment once patient is medically stable.  #4 hypokalemia/hypophosphatemia Will keep magnesium level equal or greater than 2. Replete potassium and phosphorus. Will follow in the a.m.  #5 hypertensive urgency Patient is still on a labetalol drip. BP improved. Will D/C labetalol drip. Continue lisinopril 40 mg daily. Continue HCTZ at 25 mg daily. Continue metoprolol 75 mg daily. Titrate BP meds as needed.  #6 atypical chest pain Troponins are negative x3. Patient is currently chest pain-free. 2-D echo with EF 55-60%. NWMA. No further workup needed at this time.  #7  chronic alcohol dependence Continue the Ativan CIWA protocol.  #8 history of thrombocytopenia Was felt to be secondary to alcohol abuse. Platelets are stable. Follow.  #9 diarrhea C. difficile PCR is negative. Imodium as needed.  #10 prophylaxis PPI for GI prophylaxis. SCDs for DVT prophylaxis.  Code Status: FULL Family Communication: Updated patient and wife at bedside. Disposition Plan: Will likely need inpatient psychiatry once medically stable   Brief narrative: Jeremy Douglas is a 56 y.o. male with past medical history of hypertension, obstructive sleep apnea on CPAP machine at home, chronic alcohol dependence, tobacco abuse who presents to the ED with a Tylenol overdose. Patient states has had a lot of stress going on in his life including work family and his relationship and a such as been somewhat depressed and has been drinking as well and did not want to live. Patient states that last week was his birthday and he essentially said in his apartment and was drinking on his birthday by himself. Patient states that around 6 PM one day prior to admission he took 98 Tylenol 500 mg pills in an attempt to kill himself. Patient stated that he had about 6-7 episodes of a emesis with no bleeding, last episode was at 2 AM on the day of admission. Patient stated that around 4 AM on the day of admission he realized that taken his lactulose not the right thing to do and that subsequently ended up And his friend who brought him to the ED. Patient denies any fever, no dysuria, no constipation, no weakness. Patient states has a little bit of a headache. Patient also endorses a nonproductive cough, patient's it has some episodes of feeling hot and feeling cold, patient  endorsed a dull midsternal left-sided chest pain which was nonradiating lasting about 2-3 minutes when he was laying down on the gurney in the ED which subsequently resolved after sitting up. Patient denies any palpitations. Patient also did  have some shortness of breath associated with this the chest pain which has since resolved. Patient does endorse an episode of diarrhea in the emergency room. Patient was seen in the ED CBC which was done was unremarkable. A comprehensive metabolic profile which was done did have a potassium of 2.9 a AST of 44 ALT of 30 to protein of 8.4 otherwise was within normal limits. A Tylenol level which was obtained was 51.5. Salicylate level which was obtained was less than 2. EKG which was done did show a sinus rhythm with incomplete right bundle branch block which is old. Coagulation panel is pending at this time. Patient was also noted to have systolic blood pressure in the 200s. Patient was started on IV Mucomyst in the ED. We were called to admit the patient for further evaluation and management   Consultants:  Psychiatry: Dr. Theotis Barrio  Procedures:  Chest x-ray 09/08/2012  Antibiotics:  None  HPI/Subjective: Patient denies any chest pain. No shortness of breath. Patient stated had loose stools last night and this morning. No other complaints.  Objective: Filed Vitals:   09/09/12 0700 09/09/12 0800 09/09/12 0810 09/09/12 1007  BP: 132/70  148/56 144/56  Pulse: 67  68 65  Temp:  98 F (36.7 C)    TempSrc:      Resp: 19  22   Height:      Weight:      SpO2: 92%  94%     Intake/Output Summary (Last 24 hours) at 09/09/12 1023 Last data filed at 09/09/12 0750  Gross per 24 hour  Intake 3267.33 ml  Output    700 ml  Net 2567.33 ml   Filed Weights   09/07/12 0515 09/09/12 0000  Weight: 117.935 kg (260 lb) 128.3 kg (282 lb 13.6 oz)    Exam:   General:  NAD  Cardiovascular: RRR  Respiratory: CTAB  Abdomen: SOFT/NT/ND/+BS  Data Reviewed: Basic Metabolic Panel:  Lab 09/09/12 1610 09/09/12 0319 09/08/12 0558 09/08/12 0550 09/07/12 2324 09/07/12 2140 09/07/12 0535  NA -- 136 135 -- -- 136 139  K -- 2.7* 3.0* -- -- 2.4* 2.9*  CL -- 104 99 -- -- 97 99  CO2 -- 21 22 -- -- 25 22   GLUCOSE -- 133* 140* -- -- 134* 144*  BUN -- 9 12 -- -- 11 13  CREATININE -- 0.73 0.98 -- -- 0.90 0.84  CALCIUM -- 8.4 9.0 -- -- 9.2 9.4  MG 1.9 -- -- 1.6 -- -- 1.8  PHOS -- 2.6 -- -- 1.5* -- --   Liver Function Tests:  Lab 09/09/12 0319 09/08/12 0558 09/07/12 2140 09/07/12 0535  AST 115* 155* 107*107* 44*  ALT 117* 103* 62*62* 32  ALKPHOS 67 80 8483 114  BILITOT 1.0 1.4* 1.4*1.4* 1.0  PROT 6.1 6.8 7.07.1 8.4*  ALBUMIN 2.9* 3.3* 3.4*3.4* 4.3   No results found for this basename: LIPASE:5,AMYLASE:5 in the last 168 hours No results found for this basename: AMMONIA:5 in the last 168 hours CBC:  Lab 09/09/12 0319 09/08/12 0558 09/07/12 0535  WBC 8.7 10.6* 9.0  NEUTROABS 6.1 -- --  HGB 10.4* 12.4* 13.7  HCT 31.0* 36.6* 40.4  MCV 60.9* 60.8* 60.7*  PLT 120* 160 217   Cardiac Enzymes:  Lab 09/08/12  1055 09/08/12 0558 09/07/12 2324  CKTOTAL -- -- --  CKMB -- -- --  CKMBINDEX -- -- --  TROPONINI <0.30 <0.30 <0.30   BNP (last 3 results) No results found for this basename: PROBNP:3 in the last 8760 hours CBG:  Lab 09/07/12 0651  GLUCAP 161*    Recent Results (from the past 240 hour(s))  MRSA PCR SCREENING     Status: Normal   Collection Time   09/07/12 10:33 PM      Component Value Range Status Comment   MRSA by PCR NEGATIVE  NEGATIVE Final   CLOSTRIDIUM DIFFICILE BY PCR     Status: Normal   Collection Time   09/08/12  1:36 AM      Component Value Range Status Comment   C difficile by pcr NEGATIVE  NEGATIVE Final      Studies: Portable Chest 1 View  09/08/2012  *RADIOLOGY REPORT*  Clinical Data: Emesis.  Shortness of breath.  PORTABLE CHEST - 1 VIEW  Comparison: PA and lateral chest 04/26/2011 and CT chest 02/08/2011.  Findings: The lungs are clear.  No pneumothorax or pleural fluid. Heart size normal.  IMPRESSION: No acute disease.   Original Report Authenticated By: Bernadene Bell. D'ALESSIO, M.D.     Scheduled Meds:    . sodium chloride   Intravenous STAT  .  antiseptic oral rinse  15 mL Mouth Rinse BID  . folic acid  1 mg Oral Daily  . hydrochlorothiazide  25 mg Oral Daily  . influenza  inactive virus vaccine  0.5 mL Intramuscular Once  . lisinopril  40 mg Oral Daily  . loperamide  4 mg Oral Once  . LORazepam  0-4 mg Oral Q6H   Followed by  . LORazepam  0-4 mg Oral Q12H  . magnesium sulfate LVP 250-500 ml  3 g Intravenous Once  . metoprolol succinate  25 mg Oral Once  . metoprolol succinate  75 mg Oral Daily  . multivitamin with minerals  1 tablet Oral Daily  . nicotine  21 mg Transdermal Once  . nicotine  21 mg Transdermal Daily  . pantoprazole  40 mg Oral Q1200  . pneumococcal 23 valent vaccine  0.5 mL Intramuscular Once  . potassium chloride  40 mEq Oral Once  . potassium chloride  40 mEq Oral Q4H  . potassium chloride  40 mEq Oral Once  . potassium chloride  40 mEq Oral Q4H  . potassium phosphate IVPB (mmol)  25 mmol Intravenous Once  . vitamin B-6  25 mg Oral Daily  . sodium chloride  3 mL Intravenous Q12H  . thiamine  100 mg Oral Daily   Or  . thiamine  100 mg Intravenous Daily  . vitamin B-12  100 mcg Oral Daily  . DISCONTD: magnesium sulfate 1 - 4 g bolus IVPB  3 g Intravenous Once  . DISCONTD: metoprolol succinate  50 mg Oral Daily   Continuous Infusions:    . sodium chloride 75 mL/hr at 09/09/12 0514  . dextrose 5 % 500 mL with acetylcysteine (ACETADOTE) 20,000 mg infusion    . DISCONTD: acetylcysteine 14.996 mg/kg/hr (09/08/12 0530)  . DISCONTD: dextrose 5 % 500 mL with acetylcysteine (ACETADOTE) 20,000 mg infusion 44.2 mL/hr at 09/08/12 2132  . DISCONTD: labetalol (NORMODYNE) infusion 0.5 mg/min (09/09/12 1006)    Principal Problem:  *Tylenol overdose Active Problems:  TOBACCO USER  SLEEP APNEA  Suicidal overdose  Hypertensive urgency  HTN (hypertension)  Hypokalemia  Alcohol dependence  Thrombocytopenia  Chest pain  Hypophosphatemia  Transaminitis  Diarrhea  Anemia    Time spent: 40  mins    [facility]  [facility] Pager [phone]. If 8PM-8AM, please contact night-coverage at [email or web address], password [facility] 09/09/2012, 10:23 AM  LOS: 2 days

## 2012-09-09 NOTE — Consult Note (Signed)
. Patient Identification:  Jeremy Douglas Date of Evaluation:  09/09/2012 Reason for Consult:  Overdose with Tylenol   Referring Provider: Dr. 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  They continue to work together.  He has been a binge drinker for some time.  He met a Gfriend and says they recently separated due to stress.  He has been a weekend drinker for years and recently this became worse.  He says he had a birthday and it all hit him - stress about GF and working with ex-wife; which GF does not understand, delays in getting orders completed etc.  He took 41 tylenol on his birthday.  He called a friend and was brought to the ED.   Past Psychiatric History:alcohol dependence   Past Medical History:     Past Medical History  Diagnosis Date  . Hypertension   . Sleep apnea     pt states he is on bipap at night for sleep apnea  . HTN (hypertension) 09/07/2012  . Alcohol dependence 09/07/2012  . Gastritis 09/07/2012    Per EGD 04/2011  . Esophagitis 09/07/2012    Per EGD 04/2011  . Thrombocytopenia 09/07/2012    Hx of in past.       Past Surgical History  Procedure Date  . Tonsillectomy     as a child  . Rigth knee arthroscopy 06/20/2011    Allergies: No Known Allergies  Current Medications:  Prior to Admission medications   Medication Sig Start Date End Date Taking? Authorizing Provider  Cyanocobalamin (VITAMIN B12 PO) Take 1 tablet by mouth daily.    Yes Historical Provider, MD  hydrochlorothiazide 25 MG tablet Take 1 tablet by mouth Daily. 06/01/11  Yes Historical Provider, MD  lisinopril (PRINIVIL,ZESTRIL) 40 MG tablet Take 1 tablet by mouth Daily. 03/08/11  Yes Historical Provider, MD  metoprolol (TOPROL-XL) 50 MG 24 hr tablet Take 1 tablet by mouth Daily. 06/05/11  Yes Historical Provider, MD  Multiple Vitamin (MULITIVITAMIN WITH MINERALS) TABS Take 1 tablet by mouth daily.    Yes Historical Provider, MD    Pyridoxine HCl (VITAMIN B-6 PO) Take 1 tablet by mouth daily.    Yes Historical Provider, MD  Thiamine HCl (VITAMIN B-1 PO) Take 1 tablet by mouth daily.    Yes Historical Provider, MD    Social History:    reports that he has been smoking Cigarettes.  He has a 24.8 pack-year smoking history. He has never used smokeless tobacco. He reports that he drinks alcohol. His drug history not on file.   Family History:    History reviewed. No pertinent family history.  Mental Status Examination/Evaluation: Objective:  Appearance: Casual and Overweight  Psychomotor Activity:  Normal  Eye Contact::  Good  Speech:  Clear and Coherent and Normal Rate  Volume:  Normal  Mood:  Anxious and Depressed  Affect:  Congruent and Depressed  Thought Process:  Coherent, Relevant and Intact  Orientation:  Full  Thought Content:  Suicidal ideation  Suicidal Thoughts:  No  Homicidal Thoughts:  No  Judgement:  Impaired  Insight:  Fair    DIAGNOSIS:   AXIS I   Bipolar Disorder, depressed most recent  AXIS II  Deffered  AXIS III See medical notes.  AXIS IV occupational problems, other psychosocial or environmental problems, problems related to social environment and problems with primary support group  AXIS V 51-60 moderate symptoms   Assessment/Plan:  Discussed with Dr. Janee Morn. Patient is alert and apparently comfortable discussing his use of alcohol in his recent conflicts regarding his business created with his wife, now ex-wife, and a recent girlfriend. His drinking has a span of many decades. Apparently restricted to weekends only. (This may be only the initial impression of what may be more daily use and reported.) He stated that business became calmer some with meeting deadlines in getting all details secured and meeting somebody who did not understand it working relationship with the "ex-wife" plus his concern about his daughter who has a degree and has not found a position commensurate with her  education. He felt very suicidal and that on his birthday and took the Tylenol and then took other pills and realized she had made a great mistake. He called a friend who brought him to the ED. He now recognizes that meeting a girlfriend that in the time that he really needs to pay more attention to building and restructuring his business with the ex-wife is paramount to the likelihood of his business and future retirement. This created a lot of conflicts in his decision making. He has not addressed the impact of his frequent, excessive drinking of alcohol on the weekends; nor the difficult childhood that created a lot of underlying distress. He denies suicidal thoughts today but continues to feel stressed about the business. He recognizes that he needs some revamping and he needs to address his alcohol abuse at this time. He is agreeable to a referral to the inpatient psychiatric Hospital, [facility] or other psychiatric hospitals available. RECOMMENDATION: 1.  Refer patient to [facility] or a similar facility for dual diagnosis of  alcohol dependence and bipolar disorder, depression most prominent. 2.  Request Psych CSW to contact [facility] to find if the referral is suitable and bed is available. 3. 3.  This patient was seen 09/11/12 in the afternoon. Patient is agreeable to transfer for depression and substance abuse treatment in an appropriate psychiatric facility. 4.  Will follow patient until disposition is determined. Gennett Garcia J. Ferol Luz, MD Psychiatrist  09/08/12 (date of assessment)  1:37 AM

## 2012-09-09 NOTE — Progress Notes (Signed)
Pharmacy consulted to replete phosphorus level. Patient is s/p 25 mmol of IV Kphos on 9/16. Phos this AM wnl at 2.6.  Pharmacy will sign off.  Thanks  Geoffry Paradise, PharmD, BCPS Pager: [phone] 7:36 AM Pharmacy #: [phone]

## 2012-09-09 NOTE — Progress Notes (Signed)
K 2.7 this am.K.Schorr, NP ordered replacement doses. Probable cause, frequent loose stools.

## 2012-09-09 NOTE — Progress Notes (Signed)
Chart review.  Per psych MD note, Pt amenable to [facility].  Notified [facility].  CSW to continue to follow.  Providence Crosby, LCSWA Clinical Social Work [phone]

## 2012-09-10 DIAGNOSIS — D649 Anemia, unspecified: Secondary | ICD-10-CM

## 2012-09-10 DIAGNOSIS — R079 Chest pain, unspecified: Secondary | ICD-10-CM

## 2012-09-10 DIAGNOSIS — D696 Thrombocytopenia, unspecified: Secondary | ICD-10-CM

## 2012-09-10 LAB — PROTIME-INR
INR: 1.05 (ref 0.00–1.49)
Prothrombin Time: 13.6 seconds (ref 11.6–15.2)

## 2012-09-10 LAB — CBC WITH DIFFERENTIAL/PLATELET
Basophils Relative: 1 % (ref 0–1)
Eosinophils Absolute: 0.2 10*3/uL (ref 0.0–0.7)
Eosinophils Relative: 2 % (ref 0–5)
HCT: 29.4 % — ABNORMAL LOW (ref 39.0–52.0)
Hemoglobin: 9.6 g/dL — ABNORMAL LOW (ref 13.0–17.0)
Lymphocytes Relative: 22 % (ref 12–46)
MCHC: 32.7 g/dL (ref 30.0–36.0)
Monocytes Relative: 9 % (ref 3–12)
Neutro Abs: 4.8 10*3/uL (ref 1.7–7.7)

## 2012-09-10 LAB — HEPATITIS PANEL, ACUTE
HCV Ab: NEGATIVE
Hepatitis B Surface Ag: NEGATIVE

## 2012-09-10 LAB — COMPREHENSIVE METABOLIC PANEL
ALT: 98 U/L — ABNORMAL HIGH (ref 0–53)
AST: 68 U/L — ABNORMAL HIGH (ref 0–37)
Calcium: 8.6 mg/dL (ref 8.4–10.5)
GFR calc Af Amer: >90 mL/min (ref >90)
Glucose, Bld: 88 mg/dL (ref 70–99)
Sodium: 139 mEq/L (ref 135–145)
Total Protein: 6.3 g/dL (ref 6.0–8.3)

## 2012-09-10 LAB — MAGNESIUM: Magnesium: 1.9 mg/dL (ref 1.5–2.5)

## 2012-09-10 MED ORDER — DIVALPROEX SODIUM ER 250 MG PO TB24: ORAL_TABLET | ORAL | Status: DC

## 2012-09-10 MED ORDER — ARIPIPRAZOLE 5 MG PO TABS: ORAL_TABLET | ORAL | Status: DC

## 2012-09-10 MED ORDER — BUPROPION HCL ER (XL) 150 MG PO TB24: ORAL_TABLET | ORAL | Status: DC

## 2012-09-10 MED ORDER — POTASSIUM CHLORIDE CRYS ER 20 MEQ PO TBCR
20.0000 meq | EXTENDED_RELEASE_TABLET | Freq: Once | ORAL | Status: AC
  Administered 2012-09-10: 20 meq via ORAL
  Filled 2012-09-10: qty 1

## 2012-09-10 MED ORDER — METOPROLOL SUCCINATE ER 50 MG PO TB24: 50.0000 mg | ORAL_TABLET | Freq: Every day | ORAL | Status: DC

## 2012-09-10 NOTE — Progress Notes (Signed)
Patient's heart rate dropped to 37 while sitting, sleeping in the chair.  Patient asymptomatic. Text sent to Dr. Cena Benton.  Awaiting orders.  Will monitor.

## 2012-09-10 NOTE — Progress Notes (Signed)
Per MD, Pt ready for d/c.  Providence Crosby, LCSWA Clinical Social Work [phone]

## 2012-09-10 NOTE — Progress Notes (Signed)
Per Tori, Pt awaiting review at [facility].  CSW to continue to follow.  Providence Crosby, LCSWA Clinical Social Work [phone]

## 2012-09-10 NOTE — Consult Note (Addendum)
Patient Identification:  Jeremy Douglas Date of Evaluation:  09/10/2012 Reason for Consult:  Alcohol Dependency  Referring Provider: Dr. Cena Benton   History of Present Illness: Pt has been drinking alcohol for years.  He gradually developed a dependency and a pattern of binge drinking.  Originally he says he was drinking all weekend.  Now he says he may go months without drinking and start drinking for an entire week.  Most recently he had reacted to the event of his birthday.  He decided to take ~ 98 of 500 mgTylenol pills with thoughts of killing himself.  He had several bouts of emesis following this overdose; last occurring ~ 2 am   Two hours later, he took lactulose, after which he decided he needed to call a friend.  The friend brought him to the ED at [facility].   Past Psychiatric History:pt has spent many hours in therapy with a variety of therapists but never saw a psychiatrist.  He began drinking alcohol and progressively developed a pattern of binge drinking that is difficult to stop; now may be a week long.    Past Medical History:     Past Medical History  Diagnosis Date  . Hypertension   . Sleep apnea     pt states he is on bipap at night for sleep apnea  . HTN (hypertension) 09/07/2012  . Alcohol dependence 09/07/2012  . Gastritis 09/07/2012    Per EGD 04/2011  . Esophagitis 09/07/2012    Per EGD 04/2011  . Thrombocytopenia 09/07/2012    Hx of in past.       Past Surgical History  Procedure Date  . Tonsillectomy     as a child  . Rigth knee arthroscopy 06/20/2011    Allergies: No Known Allergies  Current Medications:  Prior to Admission medications   Medication Sig Start Date End Date Taking? Authorizing Provider  Cyanocobalamin (VITAMIN B12 PO) Take 1 tablet by mouth daily.    Yes Historical Provider, MD  hydrochlorothiazide 25 MG tablet Take 1 tablet by mouth Daily. 06/01/11  Yes Historical Provider, MD  lisinopril (PRINIVIL,ZESTRIL) 40 MG tablet Take 1 tablet by mouth Daily.  03/08/11  Yes Historical Provider, MD  metoprolol (TOPROL-XL) 50 MG 24 hr tablet Take 1 tablet by mouth Daily. 06/05/11  Yes Historical Provider, MD  Multiple Vitamin (MULITIVITAMIN WITH MINERALS) TABS Take 1 tablet by mouth daily.    Yes Historical Provider, MD  Pyridoxine HCl (VITAMIN B-6 PO) Take 1 tablet by mouth daily.    Yes Historical Provider, MD  Thiamine HCl (VITAMIN B-1 PO) Take 1 tablet by mouth daily.    Yes Historical Provider, MD    Social History:    reports that he has been smoking Cigarettes.  He has a 24.8 pack-year smoking history. He has never used smokeless tobacco. He reports that he drinks alcohol. His drug history not on file.   Family History:    History reviewed. No pertinent family history.  Mental Status Examination/Evaluation: Objective:  Appearance: Disheveled and Obese  Psychomotor Activity:  Normal  Eye Contact::  Good  Speech:  Clear and Coherent  Volume:  Normal  Mood:  Anxious, Depressed and Dysphoric  Affect:  Appropriate, Congruent and Depressed  Thought Process:  Coherent, Relevant, Intact and recognizes need to put things in balance  Orientation:  Full  Thought Content:  Recognition that drinking needs to stop  Suicidal Thoughts:  No  Homicidal Thoughts:  No  Judgement:  Fair  Insight:  Lacking  DIAGNOSIS:    AXIS I  Bipolar I, mixed; depression most prevalent; chronic alcohol dependence; tobacco dependencet;   AXIS II  Deffered  AXIS III See medical notes.  AXIS IV housing problems and occupational problems  AXIS V 51-60 moderate symptoms   Assessment/Plan:  Discussed with Dr. Cena Benton, Psych CSW  Pt allows ex-wife [business partner] to be present. Pt is awake alert and seeks discharge.  Initially, thought was to admit to [facility] then admission is declined. Pt then requests to be discharged to home.  He fully acknowledges that he has a problem with excessive drinking of alcohol.  He is aware that he made a poor choice of trying to kill  himself with 98 pills of 500 mg Tylenol on his birthday.  He had N/V and buddy took him to ED [facility].  He says he has problems with ex-wife, daughter, business and other relationships because he recognizes that his moods vary and he is reacting to everything.  He wants his daughter to have a positive experience in college.  He wants to face his addictions and says he wants to be aware of a complete [comprehensive] program to change his life and stresses. He acknowledges that he has let the company 'go' during the weeks he has opted to drink instead of work.  He is willing to try to follow a more controlled diet in coordination with abstinence from alcohol.  Treatment options are discussed to initiate mood regulation that need to be continued with Psychiatrist in [place].  He is encouraged to participate in counseling to evaluate mood, monitor progress and evaluated progress toward goals.   RECOMMENDATION:  1.  Pt is demonstrates capacity to participate in treatment planning.  He says he wants long term outcomes. Pt denies suicidal ideation.  2.  Pt is encouraged to stop drinking alcohol -completely.  Attend support groups e.g. AA  If they do not try to undermine treatment for a mood disorder. 3.  Begin Wellbutrin XL 150 mg in am after breakfast  4.  Begin Ability 5 mg with Wellbutrin daily to augment antidepressant effect 5.  Begin Depakote ER 500 mg at bedtime When AST and ALT levels fall within normal limits for mood regulaton 7.  Follow up with community Psychiatrist and therapist appointments.  Pt please call [phone] for appointment 8.  No further psychiatric needs identified.  MD Psychiatrist signs off Jenisis Harmsen J. Ferol Luz, MD Psychiatrist  09/10/2012  8:14 PM   Hospitalist mid-level K. Schorr notified about Rx recommendations.  Dr. Ferol Luz

## 2012-09-10 NOTE — Progress Notes (Signed)
Pt unsure if he wants to go to [facility].  Requesting meeting with psych MD.  Notified psych MD.  Psych MD to see Pt shortly.  Minerva Areola, [facility] at [facility], has concerns about Pt's medical status.  Eric and Pt's RN, Amy, discussed concerns.  Amy to f/u with MD.  Amy, RN, notified that Pt may not want to go to [facility].    RN to discuss this with MD.  Providence Crosby, LCSWA Clinical Social Work [phone]

## 2012-09-10 NOTE — Progress Notes (Signed)
Spoke with Dr Ferol Luz with Psychiatry Service who reports she has completed her consult on [name]. She has requested I enter some prescriptions for Jeremy Douglas to get at discharge.  Based on her recommendations the following prescriptions were entered into the record and given to Jeremy Douglas at d/c.  1. Wellbutrin XL 150mg : 1 PO qam after breakfast. 2. Abilify 5mg : 1 PO qam after breakfast 3. Depakote 250mg : 2 tabs qHS. (Not be be started until liver enzymes (AST/ALT) return to normal). Enough of each med was provided to last 30 days to encourage proper f/u. Jeremy Douglas verbalizes understanding of instructions for these medications.   Leanne Chang, NP-C [facility] Pager [phone]

## 2012-09-10 NOTE — Progress Notes (Signed)
Attempted to discuss Pt with Jeremy Douglas, [facility] at [facility].  No answer.  Per Fannie Knee in Ax office, Jeremy Douglas is in bed meeting and will call CSW when she's available.  Message from [place].  Attempted to call Tori back.  Tori unavailable.  CSW to continue to follow.  Providence Crosby, LCSWA Clinical Social Work [phone]

## 2012-09-10 NOTE — Progress Notes (Signed)
Patient seen by Dr Ferol Luz and Mid level Schorr. Pt discharged at this time. Discharge instructions given to patient and also belongings including Cpap machine. Will follow up with charge nurse and [facility] with information regarding patient to D/C to home.

## 2012-09-10 NOTE — Plan of Care (Signed)
Problem: Discharge Progression Outcomes Goal: Discharge plan in place and appropriate Outcome: Completed/Met Date Met:  09/10/12 Dr Ferol Luz and Mid Level Schorr up to see patient and to give scripts. Goal: Hemodynamically stable Outcome: Adequate for Discharge Pt with systolic B/P elevated above 180, prn given per M.D. Order and follow up B/P obtained. See Doc flowsheet Goal: Other Discharge Outcomes/Goals Outcome: Completed/Met Date Met:  09/10/12 Follow up with out patient behavioral health information given to patient by Dr Ferol Luz. Mid level Schorr in to see patient and provide med scripts.

## 2012-09-10 NOTE — Discharge Summary (Addendum)
Physician Discharge Summary  [name]:[id number] DOB: 1955/12/29 DOA: 09/07/2012  PCP: Darrow Bussing, MD  Admit date: 09/07/2012 Discharge date: 09/10/2012  Recommendations for Outpatient Follow-up:  1. Check Pt/INR and liver enzymes trended down while in house off of any tylenol.  Also avoid any tylenol for pain control at this juncture given his recent OD on tylenol 2. Also follow up on potassium, phosphorus, and magnesium levels.  Discharge Diagnoses:  Principal Problem:  *Tylenol overdose Active Problems:  TOBACCO USER  SLEEP APNEA  Suicidal overdose  Hypertensive urgency  HTN (hypertension)  Hypokalemia  Alcohol dependence  Thrombocytopenia  Chest pain  Hypophosphatemia  Transaminitis  Diarrhea  Anemia  Depression   Discharge Condition: Stable non suicidal  Diet recommendation: low sodium  Filed Weights   09/07/12 0515 09/09/12 0000 09/10/12 0600  Weight: 117.935 kg (260 lb) 128.3 kg (282 lb 13.6 oz) 127.8 kg (281 lb 12 oz)    History of present illness:  From original HPI: Jeremy Douglas is a 56 y.o. male with past medical history of hypertension, obstructive sleep apnea on CPAP machine at home, chronic alcohol dependence, tobacco abuse who presents to the ED with a Tylenol overdose. Patient states has had a lot of stress going on in his life including work family and his relationship and a such as been somewhat depressed and has been drinking as well and did not want to live. Patient states that last week was his birthday and he essentially said in his apartment and was drinking on his birthday by himself. Patient states that around 6 PM one day prior to admission he took 98 Tylenol 500 mg pills in an attempt to kill himself. Patient stated that he had about 6-7 episodes of a emesis with no bleeding, last episode was at 2 AM on the day of admission. Patient stated that around 4 AM on the day of admission he realized that taken his lactulose not the right  thing to do and that subsequently ended up And his friend who brought him to the ED. Patient denies any fever, no dysuria, no constipation, no weakness. Patient states has a little bit of a headache. Patient also endorses a nonproductive cough, patient's it has some episodes of feeling hot and feeling cold, patient endorsed a dull midsternal left-sided chest pain which was nonradiating lasting about 2-3 minutes when he was laying down on the gurney in the ED which subsequently resolved after sitting up. Patient denies any palpitations. Patient also did have some shortness of breath associated with this the chest pain which has since resolved. Patient does endorse an episode of diarrhea in the emergency room. Patient was seen in the ED CBC which was done was unremarkable. A comprehensive metabolic profile which was done did have a potassium of 2.9 a AST of 44 ALT of 30 to protein of 8.4 otherwise was within normal limits. A Tylenol level which was obtained was 51.5. Salicylate level which was obtained was less than 2. EKG which was done did show a sinus rhythm with incomplete right bundle branch block which is old. Coagulation panel is pending at this time. Patient was also noted to have systolic blood pressure in the 200s. Patient was started on IV Mucomyst in the ED. We were called to admit the patient for further evaluation and management.   Hospital Course:  #1 Tylenol overdose  Secondary to suicidal ideation and probable depression. Patient's Tylenol levels are now less than 15. PT/INR is within normal limits and  trending down.. Patient's liver enzymes trended down while in house while off tylenol. Mucomyst was discontinued Poison control had been following along. They had recommended that If patient's LFTs continued to rise or his coags increase or Tylenol level is not less than 15 that he would've needed another 24 hours of IV Mucomyst. Fortunately all his lab values trended down and mucomyst did not have  to be restarted.  Discussed with social worker for transition to behavioral health inpatient hospital given that patient is currently medically stable.  #2 transaminitis  Secondary to problem #1. LFTs trending down. Follow.   #3 suicidal overdose/suicidal ideation/Depression  Secondary to multiple stresses in patient's life including work and family and relationship.  . Patient per psychiatry has Bipolar II. Patient has been seen by psychiatry and recommended inpatient psychiatric treatment once patient is medically stable.   #4 hypokalemia/hypophosphatemia  Improved after replacement.  Phosphate level on 9/17 was WNL's on d/c and potassium level was improved at 3.4 on discharge date.   #5 hypertensive urgency  Was initially on labetalol drip but this was able to be discontinued on patient's home regimen.. Continue lisinopril 40 mg daily. Continue HCTZ at 25 mg daily. Continue metoprolol 75 mg daily. Titrate BP meds as needed.   #6 atypical chest pain  Troponins are negative x3. Patient is currently chest pain-free. 2-D echo with EF 55-60%. NWMA. No further workup needed at this time.   #7 chronic alcohol dependence  While in house was on Ativan and CIWA protocol. No anxiety related to discontinuation of alcohol reported on day of discharge  #8 history of thrombocytopenia  Was felt to be secondary to alcohol abuse. Platelets are stable. Follow.   #9 diarrhea  Resolved. None reported. c diff on 9/16 negative  #10 prophylaxis  While in house was on PPI for GI prophylaxis and SCDs for DVT prophylaxis  Procedures:  Echocardiogram  Consultations:  Psychiatry (Dr. Baron Sane)  Discharge Exam: Filed Vitals:   09/09/12 1600 09/09/12 2000 09/09/12 2047 09/10/12 0600  BP: 128/64  137/76 174/82  Pulse:   68 65  Temp:  99 F (37.2 C) 99.4 F (37.4 C) 97.8 F (36.6 C)  TempSrc:  Axillary Oral Axillary  Resp:   20 20  Height:      Weight:    127.8 kg (281 lb 12 oz)  SpO2:   94% 94%      General: Pt in NAD, A and O x 3 Cardiovascular: RRR, No MRG Respiratory: CTA BL, No wheezes Psychiatric: no suicide ideation, flat affect.  Discharge Instructions  Discharge Orders    Future Orders Please Complete By Expires   Diet - low sodium heart healthy      Increase activity slowly      Discharge instructions      Comments:   Please avoid taking any tylenol.  Also follow up with your pcp in 2 weeks or sooner should any new concerns arise.   Call MD for:  persistant nausea and vomiting      Call MD for:  temperature >100.4      Call MD for:  persistant dizziness or light-headedness          Medication List     As of 09/10/2012 11:24 AM    TAKE these medications         hydrochlorothiazide 25 MG tablet   Commonly known as: HYDRODIURIL   Take 1 tablet by mouth Daily.      lisinopril 40 MG tablet  Commonly known as: PRINIVIL,ZESTRIL   Take 1 tablet by mouth Daily.      metoprolol succinate 50 MG 24 hr tablet   Commonly known as: TOPROL-XL   Take 1 tablet by mouth Daily.      multivitamin with minerals Tabs   Take 1 tablet by mouth daily.      VITAMIN B-1 PO   Take 1 tablet by mouth daily.      VITAMIN B-6 PO   Take 1 tablet by mouth daily.      VITAMIN B12 PO   Take 1 tablet by mouth daily.          The results of significant diagnostics from this hospitalization (including imaging, microbiology, ancillary and laboratory) are listed below for reference.    Significant Diagnostic Studies: Portable Chest 1 View  09/08/2012  *RADIOLOGY REPORT*  Clinical Data: Emesis.  Shortness of breath.  PORTABLE CHEST - 1 VIEW  Comparison: PA and lateral chest 04/26/2011 and CT chest 02/08/2011.  Findings: The lungs are clear.  No pneumothorax or pleural fluid. Heart size normal.  IMPRESSION: No acute disease.   Original Report Authenticated By: Bernadene Bell. Maricela Curet, M.D.     Microbiology: Recent Results (from the past 240 hour(s))  MRSA PCR SCREENING     Status:  Normal   Collection Time   09/07/12 10:33 PM      Component Value Range Status Comment   MRSA by PCR NEGATIVE  NEGATIVE Final   CLOSTRIDIUM DIFFICILE BY PCR     Status: Normal   Collection Time   09/08/12  1:36 AM      Component Value Range Status Comment   C difficile by pcr NEGATIVE  NEGATIVE Final   URINE CULTURE     Status: Normal   Collection Time   09/08/12  6:25 AM      Component Value Range Status Comment   Specimen Description URINE, CLEAN CATCH   Final    Special Requests NONE   Final    Culture  Setup Time 09/09/2012 02:24   Final    Colony Count NO GROWTH   Final    Culture NO GROWTH   Final    Report Status 09/09/2012 FINAL   Final      Labs: Basic Metabolic Panel:  Lab 09/10/12 1308 09/09/12 0800 09/09/12 0319 09/08/12 0558 09/08/12 0550 09/07/12 2324 09/07/12 2140 09/07/12 0535  NA 139 -- 136 135 -- -- 136 139  K 3.4* -- 2.7* 3.0* -- -- 2.4* 2.9*  CL 106 -- 104 99 -- -- 97 99  CO2 22 -- 21 22 -- -- 25 22  GLUCOSE 88 -- 133* 140* -- -- 134* 144*  BUN 10 -- 9 12 -- -- 11 13  CREATININE 0.83 -- 0.73 0.98 -- -- 0.90 0.84  CALCIUM 8.6 -- 8.4 9.0 -- -- 9.2 9.4  MG 1.9 1.9 -- -- 1.6 -- -- 1.8  PHOS -- -- 2.6 -- -- 1.5* -- --   Liver Function Tests:  Lab 09/10/12 0450 09/09/12 0319 09/08/12 0558 09/07/12 2140 09/07/12 0535  AST 68* 115* 155* 107*107* 44*  ALT 98* 117* 103* 62*62* 32  ALKPHOS 67 67 80 8483 114  BILITOT 0.8 1.0 1.4* 1.4*1.4* 1.0  PROT 6.3 6.1 6.8 7.07.1 8.4*  ALBUMIN 3.0* 2.9* 3.3* 3.4*3.4* 4.3   No results found for this basename: LIPASE:5,AMYLASE:5 in the last 168 hours No results found for this basename: AMMONIA:5 in the last 168 hours CBC:  Lab 09/10/12  1191 09/09/12 0319 09/08/12 0558 09/07/12 0535  WBC 7.5 8.7 10.6* 9.0  NEUTROABS 4.8 6.1 -- --  HGB 9.6* 10.4* 12.4* 13.7  HCT 29.4* 31.0* 36.6* 40.4  MCV 61.6* 60.9* 60.8* 60.7*  PLT 121* 120* 160 217   Cardiac Enzymes:  Lab 09/08/12 1055 09/08/12 0558 09/07/12 2324  CKTOTAL  -- -- --  CKMB -- -- --  CKMBINDEX -- -- --  TROPONINI <0.30 <0.30 <0.30   BNP: BNP (last 3 results) No results found for this basename: PROBNP:3 in the last 8760 hours CBG:  Lab 09/07/12 0651  GLUCAP 161*    Time coordinating discharge: > 30 minutes  Signed:  Penny Pia  [facility] 09/10/2012, 11:24 AM    Addendum: Anemia: Reportedly patient had colonoscopy 2 years ago which per discussion with patient only showed a benign polyp.  I have recommended that the patient abstain from taking any NSAID's and follow up with his primary care physician for further recommendations.  Patient is aware and verbalizes understanding.  Disposition pending psychiatrist review.

## 2012-09-10 NOTE — Progress Notes (Signed)
Spoke to Dr. Cena Benton regarding patient's discharge.  If Dr. Ferol Luz sees patient fit to go home, ask her to switch his order to discharge to [facility] to discharge to home.  Also to follow up with his PCP.

## 2012-09-26 ENCOUNTER — Telehealth: Payer: Self-pay | Admitting: Internal Medicine

## 2012-09-26 NOTE — Telephone Encounter (Signed)
Spoke to he he is aware [facility] has this order and will contact him Tobe Sos

## 2012-11-19 ENCOUNTER — Other Ambulatory Visit: Payer: Self-pay | Admitting: Family Medicine

## 2012-11-19 DIAGNOSIS — I10 Essential (primary) hypertension: Secondary | ICD-10-CM

## 2012-11-25 ENCOUNTER — Ambulatory Visit
Admission: RE | Admit: 2012-11-25 | Discharge: 2012-11-25 | Disposition: A | Discharge status: Home / Self Care | Payer: BC Managed Care – PPO | Source: Ambulatory Visit | Attending: Family Medicine | Admitting: Family Medicine

## 2012-11-25 DIAGNOSIS — I10 Essential (primary) hypertension: Secondary | ICD-10-CM

## 2012-11-28 ENCOUNTER — Ambulatory Visit (INDEPENDENT_AMBULATORY_CARE_PROVIDER_SITE_OTHER): Payer: BC Managed Care – PPO | Admitting: Internal Medicine

## 2012-11-28 ENCOUNTER — Encounter (HOSPITAL_BASED_OUTPATIENT_CLINIC_OR_DEPARTMENT_OTHER): Admission: RE | Payer: Self-pay | Source: Ambulatory Visit

## 2012-11-28 ENCOUNTER — Encounter: Payer: Self-pay | Admitting: Internal Medicine

## 2012-11-28 ENCOUNTER — Ambulatory Visit (HOSPITAL_BASED_OUTPATIENT_CLINIC_OR_DEPARTMENT_OTHER)
Admission: RE | Admit: 2012-11-28 | Payer: BC Managed Care – PPO | Source: Ambulatory Visit | Admitting: Orthopedic Surgery

## 2012-11-28 VITALS — BP 180/98 | HR 47 | Ht 71.0 in | Wt 299.0 lb

## 2012-11-28 DIAGNOSIS — G4733 Obstructive sleep apnea (adult) (pediatric): Secondary | ICD-10-CM

## 2012-11-28 SURGERY — ARTHROSCOPY, KNEE
Anesthesia: General | Site: Knee | Laterality: Left

## 2012-11-28 NOTE — Patient Instructions (Addendum)
We can continue BIPAP AutoPAP   16.8/ 20 / online supply company    Please call as needed

## 2012-11-28 NOTE — Progress Notes (Signed)
Subjective:    Patient ID: Jeremy Douglas, male    DOB: [birth year], 56 y.o.   MRN: [id number]  HPI 06/12/11- 6 yo smoker followed for OSA, complicated by HBP.  Last here 11/26/08-  Note reviewed. We had changed his machine to BiPap 20/15. His old machine shorted out. Couldn't sleep at all without it. Now using a temporary loaner CPAP machine at 15 which isn't as good. Likes a "rim" gasket that improves seal. He discussed this with his orthopedic office pending knee surgery next week. We discussed CPAP vs BiPAP and also surgery issues.   11/28/12- 66 yo smoker followed for OSA, complicated by HBP.  FOLLOWS FOR: having trouble with hypertension; Wears BiPAP/Autopap 20/17 every night and during naps- for about 7-8 hours; Changed pressure to 16.8/20 from 15/20. Can't sleep without his BiPAP. Download indicates AutoPap is holding a pressure near 18 for AHI 0. He has a fullface mask with a special liner he found that improves leak control. Still waking several times at night briefly but feels rested. He now gets most of his supplies online. Had flu vaccine. He is being referred to [facility] for hypertension.  ROS-see HPI Constitutional:   No-   weight loss, night sweats, fevers, chills, fatigue, lassitude. HEENT:   No-  headaches, difficulty swallowing, tooth/dental problems, sore throat,       No-  sneezing, itching, ear ache, nasal congestion, post nasal drip,  CV:  No-   chest pain, orthopnea, PND, swelling in lower extremities, anasarca,  dizziness, palpitations Resp: No-   shortness of breath with exertion or at rest.              No-   productive cough,  No non-productive cough,  No- coughing up of blood.              No-   change in color of mucus.  No- wheezing.   Skin: No-   rash or lesions. GI:  No-   heartburn, indigestion, abdominal pain, nausea, vomiting,  GU: . MS:  No-   joint pain or swelling.   Neuro-     nothing unusual Psych:  No- change in mood or affect. No depression or  anxiety.  No memory loss.    Objective:  OBJ- Physical Exam General- Alert, Oriented, Affect-appropriate, Distress- none acute. Obese Skin- rash-none, lesions- none, excoriation- none Lymphadenopathy- none Head- atraumatic            Eyes- Gross vision intact, PERRLA, conjunctivae and secretions clear            Ears- Hearing, canals-normal            Nose- Clear, no-Septal dev, mucus, polyps, erosion, perforation             Throat- Mallampati III , mucosa clear , drainage- none, tonsils- atrophic Neck- flexible , trachea midline, no stridor , thyroid nl, carotid no bruit Chest - symmetrical excursion , unlabored           Heart/CV- RRR , no murmur , no gallop  , no rub, nl s1 s2                           - JVD- none , edema- none, stasis changes- none, varices- none           Lung- clear to P&A, wheeze- none, cough- none , dullness-none, rub- none           Chest  wall-  Abd-  Br/ Gen/ Rectal- Not done, not indicated Extrem- cyanosis- none, clubbing, none, atrophy- none, strength- nl Neuro- grossly intact to observation  Assessment & Plan:

## 2012-12-07 NOTE — Assessment & Plan Note (Signed)
Controlled sleep apnea with appropriate use of BiPAP. I don't think there is more help for his blood pressure control to be obtained by manipulation here. Weight loss would help

## 2013-04-16 ENCOUNTER — Other Ambulatory Visit: Payer: Self-pay | Admitting: Orthopedic Surgery

## 2013-04-21 ENCOUNTER — Other Ambulatory Visit: Payer: Self-pay | Admitting: Orthopedic Surgery

## 2013-05-08 ENCOUNTER — Ambulatory Visit (HOSPITAL_BASED_OUTPATIENT_CLINIC_OR_DEPARTMENT_OTHER)
Admission: RE | Admit: 2013-05-08 | Payer: BC Managed Care – PPO | Source: Ambulatory Visit | Admitting: Orthopedic Surgery

## 2013-05-08 ENCOUNTER — Encounter (HOSPITAL_BASED_OUTPATIENT_CLINIC_OR_DEPARTMENT_OTHER): Admission: RE | Payer: Self-pay | Source: Ambulatory Visit

## 2013-05-08 SURGERY — ARTHROSCOPY, KNEE
Anesthesia: General | Laterality: Left

## 2013-07-25 IMAGING — CR DG CHEST 1V PORT
1 series · 1 of 1 positions shown · non-contrast
Comparison: PA and lateral chest 04/26/2011 and CT chest
02/08/2011.

CLINICAL DATA: Emesis.  Shortness of breath.

PORTABLE CHEST - 1 VIEW

[AP]
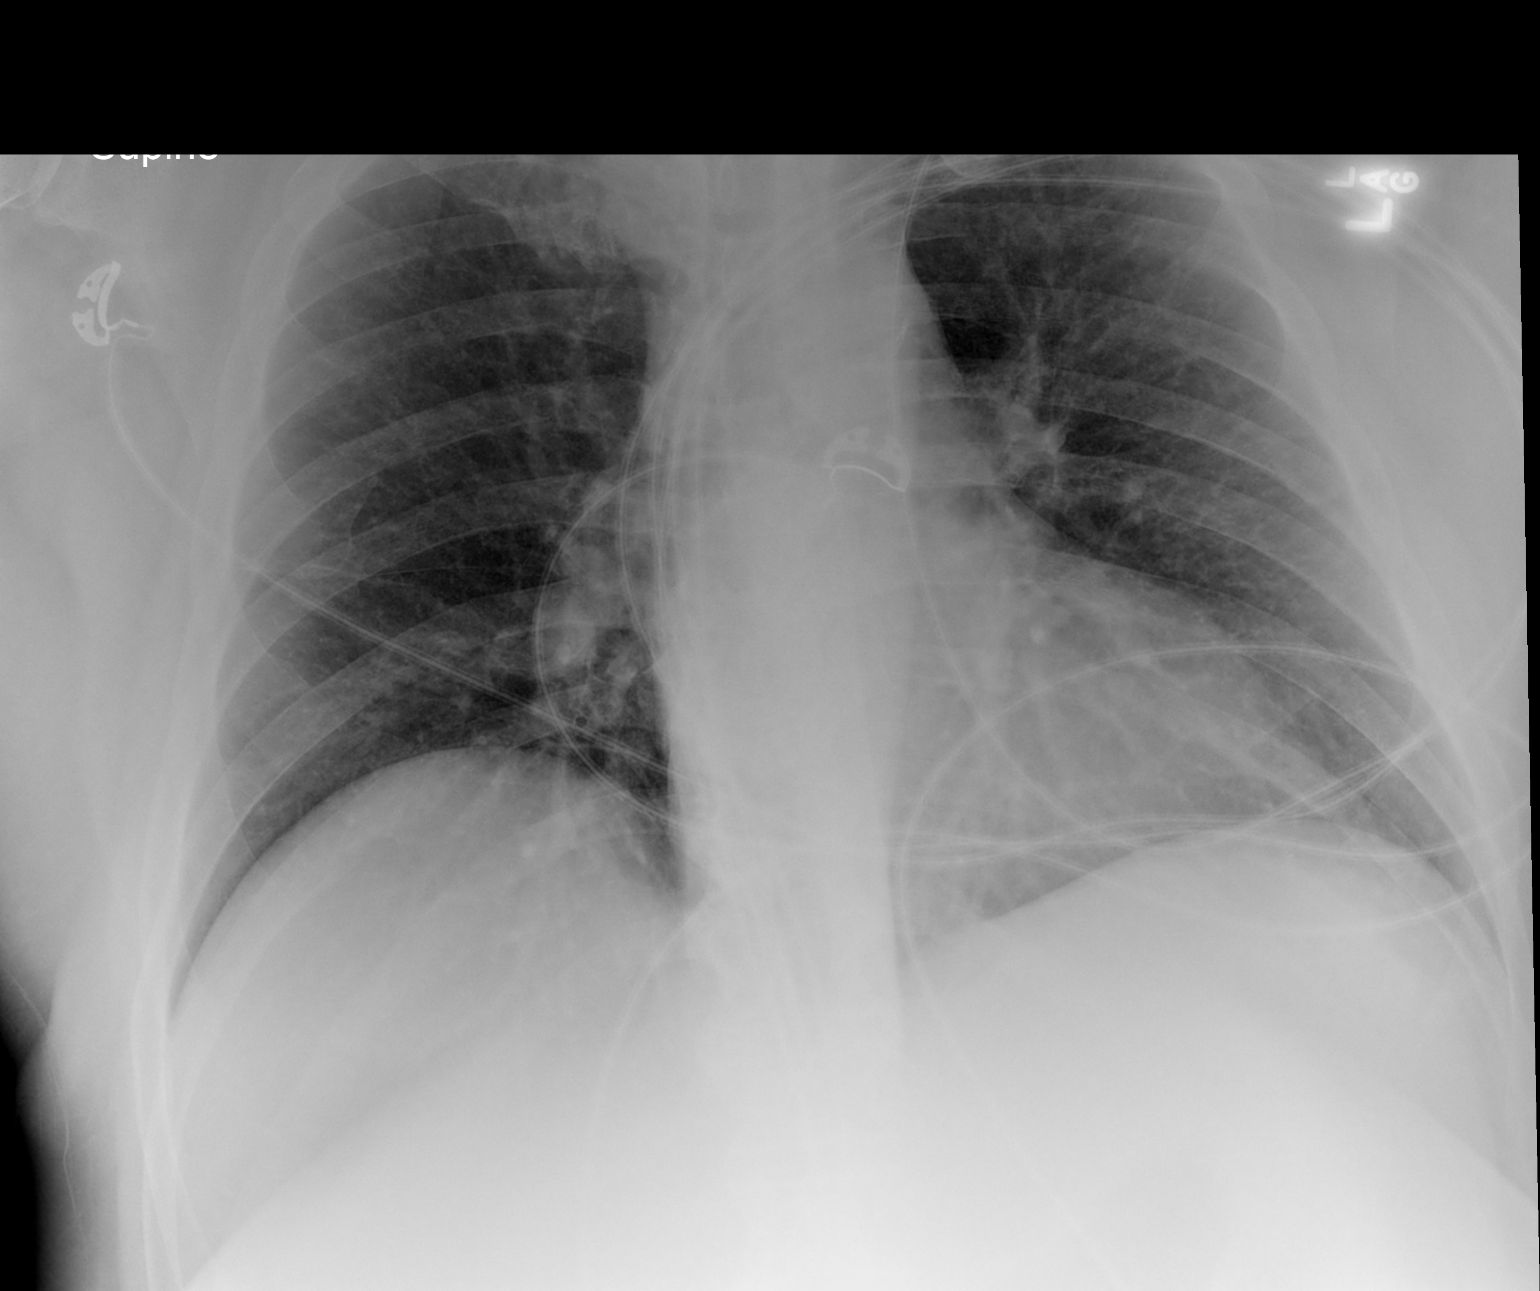

[1 of 1 positions shown; findings below may reference images not displayed]

FINDINGS: The lungs are clear.  No pneumothorax or pleural fluid.
Heart size normal.
IMPRESSION: No acute disease.

## 2013-09-08 ENCOUNTER — Other Ambulatory Visit: Payer: Self-pay | Admitting: Orthopedic Surgery

## 2013-09-14 ENCOUNTER — Encounter (HOSPITAL_BASED_OUTPATIENT_CLINIC_OR_DEPARTMENT_OTHER): Payer: Self-pay | Admitting: *Deleted

## 2013-09-14 NOTE — Progress Notes (Signed)
Pt still on cpap-but has lost 50lb-off some htn meds-stable now-saw [facility] last month-echo done-to come in for cmet-hx etoh abuse Bring cpap dos and use post op

## 2013-09-16 ENCOUNTER — Encounter (HOSPITAL_BASED_OUTPATIENT_CLINIC_OR_DEPARTMENT_OTHER)
Admission: RE | Admit: 2013-09-16 | Discharge: 2013-09-16 | Disposition: A | Discharge status: Home / Self Care | Payer: BC Managed Care – PPO | Source: Ambulatory Visit | Attending: Orthopedic Surgery | Admitting: Orthopedic Surgery

## 2013-09-16 LAB — COMPREHENSIVE METABOLIC PANEL
ALT: 24 U/L (ref 0–53)
Albumin: 3.9 g/dL (ref 3.5–5.2)
Alkaline Phosphatase: 91 U/L (ref 39–117)
Chloride: 103 mEq/L (ref 96–112)
GFR calc non Af Amer: 78 mL/min — ABNORMAL LOW (ref >90)
Potassium: 4.5 mEq/L (ref 3.5–5.1)
Sodium: 139 mEq/L (ref 135–145)
Total Protein: 7.7 g/dL (ref 6.0–8.3)

## 2013-09-18 ENCOUNTER — Ambulatory Visit (HOSPITAL_BASED_OUTPATIENT_CLINIC_OR_DEPARTMENT_OTHER)
Admission: RE | Admit: 2013-09-18 | Discharge: 2013-09-18 | Disposition: A | Discharge status: Home / Self Care | Payer: BC Managed Care – PPO | Source: Ambulatory Visit | Attending: Orthopedic Surgery | Admitting: Orthopedic Surgery

## 2013-09-18 ENCOUNTER — Encounter (HOSPITAL_BASED_OUTPATIENT_CLINIC_OR_DEPARTMENT_OTHER)
Admission: RE | Disposition: A | Discharge status: Home / Self Care | Payer: Self-pay | Source: Ambulatory Visit | Attending: Orthopedic Surgery

## 2013-09-18 ENCOUNTER — Encounter (HOSPITAL_BASED_OUTPATIENT_CLINIC_OR_DEPARTMENT_OTHER): Payer: Self-pay | Admitting: *Deleted

## 2013-09-18 ENCOUNTER — Ambulatory Visit (HOSPITAL_BASED_OUTPATIENT_CLINIC_OR_DEPARTMENT_OTHER): Payer: BC Managed Care – PPO | Admitting: *Deleted

## 2013-09-18 DIAGNOSIS — G4730 Sleep apnea, unspecified: Secondary | ICD-10-CM | POA: Insufficient documentation

## 2013-09-18 DIAGNOSIS — F1020 Alcohol dependence, uncomplicated: Secondary | ICD-10-CM | POA: Insufficient documentation

## 2013-09-18 DIAGNOSIS — M2240 Chondromalacia patellae, unspecified knee: Secondary | ICD-10-CM | POA: Insufficient documentation

## 2013-09-18 DIAGNOSIS — X58XXXA Exposure to other specified factors, initial encounter: Secondary | ICD-10-CM | POA: Insufficient documentation

## 2013-09-18 DIAGNOSIS — I10 Essential (primary) hypertension: Secondary | ICD-10-CM | POA: Insufficient documentation

## 2013-09-18 DIAGNOSIS — IMO0002 Reserved for concepts with insufficient information to code with codable children: Secondary | ICD-10-CM | POA: Insufficient documentation

## 2013-09-18 DIAGNOSIS — M1710 Unilateral primary osteoarthritis, unspecified knee: Secondary | ICD-10-CM | POA: Insufficient documentation

## 2013-09-18 HISTORY — PX: KNEE ARTHROSCOPY: SHX127

## 2013-09-18 SURGERY — ARTHROSCOPY, KNEE
Anesthesia: General | Site: Knee | Laterality: Left | Wound class: Clean

## 2013-09-18 MED ORDER — ONDANSETRON HCL 4 MG/2ML IJ SOLN
INTRAMUSCULAR | Status: DC | PRN
  Administered 2013-09-18: 4 mg via INTRAVENOUS

## 2013-09-18 MED ORDER — CEFAZOLIN SODIUM-DEXTROSE 2-3 GM-% IV SOLR
2.0000 g | INTRAVENOUS | Status: AC
  Administered 2013-09-18: 2 g via INTRAVENOUS

## 2013-09-18 MED ORDER — HYDROMORPHONE HCL PF 1 MG/ML IJ SOLN
0.2500 mg | INTRAMUSCULAR | Status: DC | PRN
  Administered 2013-09-18 (×4): 0.5 mg via INTRAVENOUS

## 2013-09-18 MED ORDER — LIDOCAINE HCL (CARDIAC) 20 MG/ML IV SOLN
INTRAVENOUS | Status: DC | PRN
  Administered 2013-09-18: 50 mg via INTRAVENOUS

## 2013-09-18 MED ORDER — SODIUM CHLORIDE 0.9 % IR SOLN
Status: DC | PRN
  Administered 2013-09-18: 6000 mL

## 2013-09-18 MED ORDER — FENTANYL CITRATE 0.05 MG/ML IJ SOLN
INTRAMUSCULAR | Status: DC | PRN
  Administered 2013-09-18: 100 ug via INTRAVENOUS

## 2013-09-18 MED ORDER — HYDROMORPHONE HCL PF 1 MG/ML IJ SOLN
0.5000 mg | INTRAMUSCULAR | Status: DC | PRN
  Administered 2013-09-18: 0.5 mg via INTRAVENOUS

## 2013-09-18 MED ORDER — POVIDONE-IODINE 7.5 % EX SOLN: Freq: Once | CUTANEOUS | Status: DC

## 2013-09-18 MED ORDER — OXYCODONE HCL 5 MG PO TABS
5.0000 mg | ORAL_TABLET | Freq: Once | ORAL | Status: AC | PRN
  Administered 2013-09-18: 5 mg via ORAL

## 2013-09-18 MED ORDER — PROMETHAZINE HCL 25 MG/ML IJ SOLN: 6.2500 mg | INTRAMUSCULAR | Status: DC | PRN

## 2013-09-18 MED ORDER — BUPIVACAINE HCL (PF) 0.5 % IJ SOLN
INTRAMUSCULAR | Status: DC | PRN
  Administered 2013-09-18: 20 mL

## 2013-09-18 MED ORDER — MIDAZOLAM HCL 2 MG/2ML IJ SOLN: 0.5000 mg | Freq: Once | INTRAMUSCULAR | Status: DC | PRN

## 2013-09-18 MED ORDER — LACTATED RINGERS IV SOLN
INTRAVENOUS | Status: DC
  Administered 2013-09-18 (×2): via INTRAVENOUS

## 2013-09-18 MED ORDER — MEPERIDINE HCL 25 MG/ML IJ SOLN: 6.2500 mg | INTRAMUSCULAR | Status: DC | PRN

## 2013-09-18 MED ORDER — OXYCODONE-ACETAMINOPHEN 5-325 MG PO TABS: 1.0000 | ORAL_TABLET | ORAL | Status: DC | PRN

## 2013-09-18 MED ORDER — EPINEPHRINE HCL 1 MG/ML IJ SOLN
INTRAMUSCULAR | Status: DC | PRN
  Administered 2013-09-18: 1 mg

## 2013-09-18 MED ORDER — PROPOFOL 10 MG/ML IV BOLUS
INTRAVENOUS | Status: DC | PRN
  Administered 2013-09-18: 30 mg via INTRAVENOUS
  Administered 2013-09-18: 350 mg via INTRAVENOUS

## 2013-09-18 MED ORDER — DEXAMETHASONE SODIUM PHOSPHATE 10 MG/ML IJ SOLN
INTRAMUSCULAR | Status: DC | PRN
  Administered 2013-09-18: 10 mg via INTRAVENOUS

## 2013-09-18 MED ORDER — OXYCODONE HCL 5 MG/5ML PO SOLN: 5.0000 mg | Freq: Once | ORAL | Status: AC | PRN

## 2013-09-18 SURGICAL SUPPLY — 41 items
BANDAGE ELASTIC 6 VELCRO ST LF (GAUZE/BANDAGES/DRESSINGS) ×2 IMPLANT
BLADE 4.2CUDA (BLADE) IMPLANT
BLADE GREAT WHITE 4.2 (BLADE) ×2 IMPLANT
CANISTER OMNI JUG 16 LITER (MISCELLANEOUS) IMPLANT
CANISTER SUCTION 2500CC (MISCELLANEOUS) IMPLANT
CLOTH BEACON ORANGE TIMEOUT ST (SAFETY) ×2 IMPLANT
CUTTER MENISCUS  4.2MM (BLADE)
CUTTER MENISCUS 4.2MM (BLADE) IMPLANT
DRAPE ARTHROSCOPY W/POUCH 114 (DRAPES) ×2 IMPLANT
DRSG EMULSION OIL 3X3 NADH (GAUZE/BANDAGES/DRESSINGS) ×2 IMPLANT
DURAPREP 26ML APPLICATOR (WOUND CARE) ×2 IMPLANT
ELECT MENISCUS 165MM 90D (ELECTRODE) IMPLANT
ELECT REM PT RETURN 9FT ADLT (ELECTROSURGICAL)
ELECTRODE REM PT RTRN 9FT ADLT (ELECTROSURGICAL) IMPLANT
GLOVE BIOGEL PI IND STRL 7.0 (GLOVE) IMPLANT
GLOVE BIOGEL PI IND STRL 8 (GLOVE) ×2 IMPLANT
GLOVE BIOGEL PI INDICATOR 7.0 (GLOVE) ×1
GLOVE BIOGEL PI INDICATOR 8 (GLOVE) ×2
GLOVE ECLIPSE 6.5 STRL STRAW (GLOVE) ×2 IMPLANT
GLOVE ECLIPSE 7.5 STRL STRAW (GLOVE) ×4 IMPLANT
GLOVE EXAM NITRILE LRG STRL (GLOVE) ×1 IMPLANT
GOWN BRE IMP PREV XXLGXLNG (GOWN DISPOSABLE) ×2 IMPLANT
GOWN PREVENTION PLUS XLARGE (GOWN DISPOSABLE) ×2 IMPLANT
GOWN PREVENTION PLUS XXLARGE (GOWN DISPOSABLE) ×2 IMPLANT
HOLDER KNEE FOAM BLUE (MISCELLANEOUS) ×2 IMPLANT
KNEE WRAP E Z 3 GEL PACK (MISCELLANEOUS) ×2 IMPLANT
NDL SAFETY ECLIPSE 18X1.5 (NEEDLE) IMPLANT
NEEDLE HYPO 18GX1.5 SHARP (NEEDLE)
PACK ARTHROSCOPY DSU (CUSTOM PROCEDURE TRAY) ×2 IMPLANT
PACK BASIN DAY SURGERY FS (CUSTOM PROCEDURE TRAY) ×2 IMPLANT
PAD CAST 4YDX4 CTTN HI CHSV (CAST SUPPLIES) ×1 IMPLANT
PADDING CAST COTTON 4X4 STRL (CAST SUPPLIES) ×2
PENCIL BUTTON HOLSTER BLD 10FT (ELECTRODE) IMPLANT
SET ARTHROSCOPY TUBING (MISCELLANEOUS) ×2
SET ARTHROSCOPY TUBING LN (MISCELLANEOUS) ×1 IMPLANT
SPONGE GAUZE 4X4 12PLY (GAUZE/BANDAGES/DRESSINGS) ×2 IMPLANT
SUT ETHILON 4 0 PS 2 18 (SUTURE) IMPLANT
SYR 5ML LL (SYRINGE) ×2 IMPLANT
TOWEL OR 17X24 6PK STRL BLUE (TOWEL DISPOSABLE) ×2 IMPLANT
TOWEL OR NON WOVEN STRL DISP B (DISPOSABLE) ×2 IMPLANT
WATER STERILE IRR 1000ML POUR (IV SOLUTION) ×2 IMPLANT

## 2013-09-18 NOTE — Anesthesia Postprocedure Evaluation (Signed)
  Anesthesia Post-op Note  Patient: Jeremy Douglas  Procedure(s) Performed: Procedure(s): ARTHROSCOPY KNEE, PARTIAL MEDIAL AND LATERAL MENISCECTOMY, CHONDROPLASTY PATELLA FEMORAL JOINT (Left)  Patient Location: PACU  Anesthesia Type:General  Level of Consciousness: awake, alert , oriented and patient cooperative  Airway and Oxygen Therapy: Patient Spontanous Breathing  Post-op Pain: mild  Post-op Assessment: Post-op Vital signs reviewed, Patient's Cardiovascular Status Stable, Respiratory Function Stable, Patent Airway, No signs of Nausea or vomiting and Pain level controlled  Post-op Vital Signs: Reviewed and stable  Complications: No apparent anesthesia complications

## 2013-09-18 NOTE — Anesthesia Preprocedure Evaluation (Addendum)
Anesthesia Evaluation  Patient identified by MRN, date of birth, ID band Patient awake    Reviewed: Allergy & Precautions, H&P , NPO status , Patient's Chart, lab work & pertinent test results  History of Anesthesia Complications Negative for: history of anesthetic complications  Airway Mallampati: I TM Distance: >3 FB Neck ROM: Full    Dental  (+) Teeth Intact and Dental Advisory Given   Pulmonary sleep apnea and Continuous Positive Airway Pressure Ventilation , former smoker (quit 3/14),  breath sounds clear to auscultation  Pulmonary exam normal       Cardiovascular hypertension, Pt. on medications Rhythm:Regular Rate:Normal  '13 ECHO: normal LVF, EF 55-60%, valves OK   Neuro/Psych negative neurological ROS     GI/Hepatic negative GI ROS, (+)     substance abuse  alcohol use,   Endo/Other  Morbid obesity  Renal/GU negative Renal ROS     Musculoskeletal   Abdominal (+) + obese,   Peds  Hematology   Anesthesia Other Findings   Reproductive/Obstetrics                          Anesthesia Physical Anesthesia Plan  ASA: III  Anesthesia Plan: General   Post-op Pain Management:    Induction: Intravenous  Airway Management Planned: LMA  Additional Equipment:   Intra-op Plan:   Post-operative Plan:   Informed Consent: I have reviewed the patients History and Physical, chart, labs and discussed the procedure including the risks, benefits and alternatives for the proposed anesthesia with the patient or authorized representative who has indicated his/her understanding and acceptance.   Dental advisory given  Plan Discussed with: CRNA and Surgeon  Anesthesia Plan Comments: (Plan routine monitors, GA- LMA)        Anesthesia Quick Evaluation

## 2013-09-18 NOTE — Anesthesia Procedure Notes (Signed)
Procedure Name: LMA Insertion Date/Time: 09/18/2013 10:12 AM Performed by: Meyer Russel Pre-anesthesia Checklist: Patient identified, Emergency Drugs available, Suction available and Patient being monitored Patient Re-evaluated:Patient Re-evaluated prior to inductionOxygen Delivery Method: Circle system utilized Preoxygenation: Pre-oxygenation with 100% oxygen Intubation Type: IV induction LMA: LMA inserted LMA Size: 5.0 Number of attempts: 1 Airway Equipment and Method: Bite block Placement Confirmation: breath sounds checked- equal and bilateral Tube secured with: Tape Dental Injury: Teeth and Oropharynx as per pre-operative assessment

## 2013-09-18 NOTE — Transfer of Care (Signed)
Immediate Anesthesia Transfer of Care Note  Patient: Jeremy Douglas  Procedure(s) Performed: Procedure(s): ARTHROSCOPY KNEE, PARTIAL MEDIAL AND LATERAL MENISCECTOMY, CHONDROPLASTY PATELLA FEMORAL JOINT (Left)  Patient Location: PACU  Anesthesia Type:General  Level of Consciousness: awake, alert  and oriented  Airway & Oxygen Therapy: Patient Spontanous Breathing and Patient connected to face mask oxygen  Post-op Assessment: Report given to PACU RN, Post -op Vital signs reviewed and stable and Patient moving all extremities  Post vital signs: Reviewed and stable  Complications: No apparent anesthesia complications

## 2013-09-18 NOTE — Brief Op Note (Signed)
09/18/2013  1:06 PM  PATIENT:  Jeremy Douglas  57 y.o. male  PRE-OPERATIVE DIAGNOSIS:  Left knee medial meniscus tear and degenerative joint disease  POST-OPERATIVE DIAGNOSIS:  Left knee medial meniscus tear and chondromalacia patella  PROCEDURE:  Procedure(s): ARTHROSCOPY KNEE, PARTIAL MEDIAL AND LATERAL MENISCECTOMY, CHONDROPLASTY PATELLA FEMORAL JOINT (Left)  SURGEON:  Surgeon(s) and Role:    * Harvie Junior, MD - Primary  PHYSICIAN ASSISTANT:   ASSISTANTS: bethune   ANESTHESIA:   general  EBL:  Total I/O In: 1372 [P.O.:222; I.V.:1150] Out: -   BLOOD ADMINISTERED:none  DRAINS: none   LOCAL MEDICATIONS USED:  MARCAINE     SPECIMEN:  No Specimen  DISPOSITION OF SPECIMEN:  N/A  COUNTS:  YES  TOURNIQUET:  * No tourniquets in log *  DICTATION: .Other Dictation: Dictation Number [id number]  PLAN OF CARE: Discharge to home after PACU  PATIENT DISPOSITION:  PACU - hemodynamically stable.   Delay start of Pharmacological VTE agent (>24hrs) due to surgical blood loss or risk of bleeding: no

## 2013-09-18 NOTE — H&P (Signed)
PREOPERATIVE H&P  Chief Complaint: l knee pain  HPI: Jeremy Douglas is a 57 y.o. male who presents for evaluation of l knee pain. It has been present for greater than 6 months and has been worsening. He has failed conservative measures. Pain is rated as moderate.  Past Medical History  Diagnosis Date  . Hypertension   . Sleep apnea     pt states he is on bipap at night for sleep apnea  . HTN (hypertension) 09/07/2012  . Alcohol dependence 09/07/2012  . Gastritis 09/07/2012    Per EGD 04/2011  . Esophagitis 09/07/2012    Per EGD 04/2011  . Thrombocytopenia 09/07/2012    Hx of in past.  . Wears glasses    Past Surgical History  Procedure Laterality Date  . Tonsillectomy      as a child  . Rigth knee arthroscopy  06/20/2011  . Upper gi endoscopy    . Colonoscopy     History   Social History  . Marital Status: Divorced    Spouse Name: N/A    Number of Children: N/A  . Years of Education: N/A   Occupational History  . 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 Smoking status: Former Smoker -- 0.80 packs/day for 31 years    Types: Cigarettes    Quit date: 03/14/2013  . Smokeless tobacco: Never Used  . Alcohol Use: Yes     Comment: -hx abuse  . Drug Use: No  . Sexual Activity: No   Other Topics Concern  . None   Social History Narrative  . None   History reviewed. No pertinent family history. No Known Allergies Prior to Admission medications   Medication Sig Start Date End Date Taking? Authorizing Provider  amLODipine (NORVASC) 5 MG tablet Take 5 mg by mouth every evening.   Yes Historical Provider, MD  cholecalciferol (VITAMIN D) 1000 UNITS tablet Take 1,000 Units by mouth daily.   Yes Historical Provider, MD  cloNIDine (CATAPRES) 0.2 MG tablet Take 0.2 mg by mouth every evening.    Yes Historical Provider, MD  Cyanocobalamin (VITAMIN B12 PO) Take 1 tablet by mouth daily.    Yes Historical Provider, MD  furosemide  (LASIX) 40 MG tablet Take 40 mg by mouth daily.   Yes Historical Provider, MD  labetalol (NORMODYNE) 200 MG tablet Take 200 mg by mouth 3 (three) times daily.   Yes Historical Provider, MD  MAGNESIUM-POTASSIUM PO Take 1 tablet by mouth daily.   Yes Historical Provider, MD  Multiple Vitamin (MULITIVITAMIN WITH MINERALS) TABS Take 1 tablet by mouth daily.    Yes Historical Provider, MD  Omega-3 Fatty Acids (FISH OIL) 1000 MG CAPS Take 1 capsule by mouth daily.   Yes Historical Provider, MD  Pyridoxine HCl (VITAMIN B-6 PO) Take 1 tablet by mouth daily.    Yes Historical Provider, MD  Thiamine HCl (VITAMIN B-1 PO) Take 1 tablet by mouth daily.    Yes Historical Provider, MD  buPROPion (WELLBUTRIN XL) 150 MG 24 hr tablet Take 300 mg by mouth every morning. One tab every AM after breakfast. 09/10/12 12/28/12  Roma Kayser Schorr, NP  divalproex (DEPAKOTE ER) 250 MG 24 hr tablet Take 2 Tabs PO every HS 09/10/12 12/28/12  Roma Kayser Schorr, NP     Positive ROS: none  All other systems have been reviewed and were otherwise negative with the exception of those mentioned in the HPI and as  above.  Physical Exam: Filed Vitals:   09/18/13 0848  BP: 145/73  Pulse: 52  Temp: 98.1 F (36.7 C)  Resp: 16    General: Alert, no acute distress Cardiovascular: No pedal edema Respiratory: No cyanosis, no use of accessory musculature GI: No organomegaly, abdomen is soft and non-tender Skin: No lesions in the area of chief complaint Neurologic: Sensation intact distally Psychiatric: Patient is competent for consent with normal mood and affect Lymphatic: No axillary or cervical lymphadenopathy  MUSCULOSKELETAL: l knee: +TTP over med jt line +painful rom  Assessment/Plan: left knee medial meniscus tear and degenerative joint disease Plan for Procedure(s): ARTHROSCOPY KNEE  The risks benefits and alternatives were discussed with the patient including but not limited to the risks of nonoperative treatment,  versus surgical intervention including infection, bleeding, nerve injury, malunion, nonunion, hardware prominence, hardware failure, need for hardware removal, blood clots, cardiopulmonary complications, morbidity, mortality, among others, and they were willing to proceed.  Predicted outcome is good, although there will be at least a six to nine month expected recovery.  Nealie Mchatton L, MD 09/18/2013 9:59 AM

## 2013-09-19 NOTE — Op Note (Deleted)
NAME:  Jeremy Douglas, Jeremy Douglas              ACCOUNT NO.:  [id number]  MEDICAL RECORD NO.:  [id number]  LOCATION:                                FACILITY:  [facility]  PHYSICIAN:  Rachel Rison L. Ameli Sangiovanni, M.D.   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  POSTOPERATIVE DIAGNOSES: 1. Medial meniscal tear posterior horn. 2. Anterior and mid body lateral meniscal tear. 3. 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 4. Chondromalacia of the patellofemoral joint and chondromalacia of     the lateral compartment in particular on the lateral femoral     condyle.  PROCEDURE: 1. Arthroscopic partial posterior horn medial meniscectomy and     anterior horn partial lateral meniscectomy with corresponding     debridement of medial and lateral compartments. 2. Debridement of chondromalacia patella by way of abrasion     chondroplasty.  SURGEON:  Tylerjames Hoglund L. Areanna Gengler, M.D.  ASSISTANT:  James Bethune, PA.  ANESTHESIA:  General.  BRIEF HISTORY:  Jeremy Douglas is a 57-year-old male with history of having had significant complaints of left knee pain.  He has had a previous right knee scope which he had done well for him.  X-ray showed significant narrowing not bone-on-bone but certainly close and he was having intermittent catching, grabbing pain because of failure of conservative care, mechanical symptoms, he was taken to the operating room for operative knee arthroscopy.  DESCRIPTION OF PROCEDURE:  The patient was taken to the operating room. After the adequate anesthesia was obtained with general anesthetic, the patient was placed supine on the operating table.  Left leg was prepped and draped in the usual sterile fashion.  Following this, routine arthroscopic examination revealed there was an  obvious chondromalacia of the patellofemoral joint which was debrided back to a smooth and stable rim.  This grade 2 and grade 3 in nature of both the patella and the trochlea.  Attention turned to the medial compartment where there was an obvious posterior horn medial meniscal tear with radial in nature, back to the meniscal root had to do a fairly significant resection back there posteriorly to try to address this meniscal problem and grade 4 change on the medial tibial plateau over about an 1 x 1 cm area, had grade 3 change over the medial femoral condyle which was debrided.  Medial compartment was cleaned and attention turned to the ACL which was normal.  Attention was turned to the lateral compartment where had an anterolateral tear which went around to the mid body that was flipped portion of the meniscus up under the midbody and this was debrided. Once this was done, the knee was copiously and thoroughly lavaged and suctioned dry.  The arthroscopic portals were closed with a bandage.  Sterile compressive dressing was applied.  The patient was taken to recovery room in satisfactory condition.  Estimated blood loss for the procedure was none.     Ambrielle Kington L. Tige Meas, M.D.     JLG/MEDQ  D:    09/18/2013  T:  09/19/2013  Job:  [id number] 

## 2013-09-19 NOTE — Op Note (Signed)
NAMEWESTEN, DININO              ACCOUNT NO.:  [id number]  MEDICAL RECORD NO.:  [id number]  LOCATION:                                FACILITY:  [facility]  PHYSICIAN:  Harvie Junior, M.D.   DATE OF BIRTH:  1956-12-03  DATE OF PROCEDURE:  09/18/2013 DATE OF DISCHARGE:  09/18/2013                              OPERATIVE REPORT   PREOPERATIVE DIAGNOSIS:  Medial meniscal tear with suspected chondromalacia patella.  POSTOPERATIVE DIAGNOSES: 1. Medial meniscal tear posterior horn. 2. Anterior and mid body lateral meniscal tear. 3. 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 4. Chondromalacia of the patellofemoral joint and chondromalacia of     the lateral compartment in particular on the lateral femoral     condyle.  PROCEDURE: 1. Arthroscopic partial posterior horn medial meniscectomy and     anterior horn partial lateral meniscectomy with corresponding     debridement of medial and lateral compartments. 2. Debridement of chondromalacia patella by way of abrasion     chondroplasty.  SURGEON:  Harvie Junior, M.D.  ASSISTANT:  Marshia Ly, PA.  ANESTHESIA:  General.  BRIEF HISTORY:  Mr. Lukas is a 57 year old male with history of having had significant complaints of left knee pain.  He has had a previous right knee scope which he had done well for him.  X-ray showed significant narrowing not bone-on-bone but certainly close and he was having intermittent catching, grabbing pain because of failure of conservative care, mechanical symptoms, he was taken to the operating room for operative knee arthroscopy.  DESCRIPTION OF PROCEDURE:  The patient was taken to the operating room. After the adequate anesthesia was obtained with general anesthetic, the patient was placed supine on the operating table.  Left leg was prepped and draped in the usual sterile fashion.  Following this, routine arthroscopic examination revealed there was an  obvious chondromalacia of the patellofemoral joint which was debrided back to a smooth and stable rim.  This grade 2 and grade 3 in nature of both the patella and the trochlea.  Attention turned to the medial compartment where there was an obvious posterior horn medial meniscal tear with radial in nature, back to the meniscal root had to do a fairly significant resection back there posteriorly to try to address this meniscal problem and grade 4 change on the medial tibial plateau over about an 1 x 1 cm area, had grade 3 change over the medial femoral condyle which was debrided.  Medial compartment was cleaned and attention turned to the ACL which was normal.  Attention was turned to the lateral compartment where had an anterolateral tear which went around to the mid body that was flipped portion of the meniscus up under the midbody and this was debrided. Once this was done, the knee was copiously and thoroughly lavaged and suctioned dry.  The arthroscopic portals were closed with a bandage.  Sterile compressive dressing was applied.  The patient was taken to recovery room in satisfactory condition.  Estimated blood loss for the procedure was none.     Harvie Junior, M.D.     Ranae Plumber  D:  09/18/2013  T:  09/19/2013  Job:  [id number]

## 2013-09-21 ENCOUNTER — Encounter (HOSPITAL_BASED_OUTPATIENT_CLINIC_OR_DEPARTMENT_OTHER): Payer: Self-pay | Admitting: Orthopedic Surgery

## 2014-01-05 ENCOUNTER — Ambulatory Visit (INDEPENDENT_AMBULATORY_CARE_PROVIDER_SITE_OTHER): Payer: BC Managed Care – PPO | Admitting: Internal Medicine

## 2014-01-05 ENCOUNTER — Encounter: Payer: Self-pay | Admitting: Internal Medicine

## 2014-01-05 ENCOUNTER — Encounter (INDEPENDENT_AMBULATORY_CARE_PROVIDER_SITE_OTHER): Payer: Self-pay

## 2014-01-05 VITALS — BP 146/88 | HR 63 | Ht 70.0 in | Wt 269.0 lb

## 2014-01-05 DIAGNOSIS — G4733 Obstructive sleep apnea (adult) (pediatric): Secondary | ICD-10-CM

## 2014-01-05 DIAGNOSIS — F17200 Nicotine dependence, unspecified, uncomplicated: Secondary | ICD-10-CM

## 2014-01-05 DIAGNOSIS — F17201 Nicotine dependence, unspecified, in remission: Secondary | ICD-10-CM

## 2014-01-05 NOTE — Progress Notes (Signed)
Subjective:    Patient ID: Jeremy PlowmanRoger C Douglas, male    DOB: [birth year], 57 y.o.   MRN: [id number]  HPI 06/12/11- 58 yo smoker followed for OSA, complicated by HBP.  Last here 11/26/08-  Note reviewed. We had changed his machine to BiPap 20/15. His old machine shorted out. Couldn't sleep at all without it. Now using a temporary loaner CPAP machine at 15 which isn't as good. Likes a "rim" gasket that improves seal. He discussed this with his orthopedic office pending knee surgery next week. We discussed CPAP vs BiPAP and also surgery issues.   11/28/12- 58 yo smoker followed for OSA, complicated by HBP.  FOLLOWS FOR: having trouble with hypertension; Wears BiPAP/Autopap 20/17 every night and during naps- for about 7-8 hours; Changed pressure to 16.8/20 from 15/20. Can't sleep without his BiPAP. Download indicates AutoPap is holding a pressure near 18 for AHI 0. He has a fullface mask with a special liner he found that improves leak control. Still waking several times at night briefly but feels rested. He now gets most of his supplies online. Had flu vaccine. [facility].  01/05/14- 58 yo former smoker followed for OSA, complicated by HBP. FOLLOWS FOR: continues to wear   BiPAP/Autopap 20/17  every night and with naps; no DME company-buys supplies through [employer] on web. He says he is comfortable with his BiPAP and uses it all night every night. Has 2 machines. We discussed maintenance. Uses on-line supply company. He remains off cigarettes and denies cough or acute illness.   ROS-see HPI Constitutional:   No-   weight loss, night sweats, fevers, chills, fatigue, lassitude. HEENT:   No-  headaches, difficulty swallowing, tooth/dental problems, sore throat,       No-  sneezing, itching, ear ache, nasal congestion, post nasal drip,  CV:  No-   chest pain, orthopnea, PND, swelling in lower extremities, anasarca,  dizziness, palpitations Resp: No-   shortness of  breath with exertion or at rest.              No-   productive cough,  No non-productive cough,  No- coughing up of blood.              No-   change in color of mucus.  No- wheezing.   Skin: No-   rash or lesions. GI:  No-   heartburn, indigestion, abdominal pain, nausea, vomiting,  GU: . MS:  No-   joint pain or swelling.   Neuro-     nothing unusual Psych:  No- change in mood or affect. No depression or anxiety.  No memory loss.    Objective:  OBJ- Physical Exam General- Alert, Oriented, Affect-appropriate, Distress- none acute. Obese Skin- rash-none, lesions- none, excoriation- none Lymphadenopathy- none Head- atraumatic            Eyes- Gross vision intact, PERRLA, conjunctivae and secretions clear            Ears- Hearing, canals-normal            Nose- Clear, no-Septal dev, mucus, polyps, erosion, perforation             Throat- Mallampati III , mucosa clear , drainage- none, tonsils- atrophic Neck- flexible , trachea midline, no stridor , thyroid nl, carotid no bruit Chest - symmetrical excursion , unlabored           Heart/CV- RRR , no murmur , no gallop  , no rub, nl s1  s2                           - JVD- none , edema- none, stasis changes- none, varices- none           Lung- clear to P&A, wheeze- none, cough- none , dullness-none, rub- none           Chest wall-  Abd-  Br/ Gen/ Rectal- Not done, not indicated Extrem- cyanosis- none, clubbing, none, atrophy- none, strength- nl Neuro- grossly intact to observation  Assessment & Plan:

## 2014-01-05 NOTE — Patient Instructions (Signed)
We can continue BIPAP 20-17   Through your on-line supply  Please call as needed

## 2014-01-28 NOTE — Assessment & Plan Note (Signed)
Plan-now in long-term remission with the expectation that this is permanent.

## 2014-01-28 NOTE — Assessment & Plan Note (Signed)
He is quite satisfied with BiPAP 20/17 and demonstrates good compliance and control. He is using an online supply company for replacements when needed, instead of the DME company.

## 2014-02-25 ENCOUNTER — Other Ambulatory Visit: Payer: Self-pay | Admitting: Orthopedic Surgery

## 2014-03-08 ENCOUNTER — Encounter (HOSPITAL_COMMUNITY): Payer: Self-pay | Admitting: Pharmacy Technician

## 2014-03-10 NOTE — Pre-Procedure Instructions (Addendum)
Jeremy PlowmanRoger C Douglas  03/10/2014   Your procedure is scheduled on:  March 27th, Friday   [facility] at 8:15 AM.  Call this number if you have problems the morning of surgery: [phone]   Remember:   Do not eat food or drink liquids after midnight Thursday.    Take these medicines the morning of surgery with A SIP OF WATER: amlodipine,clonidine,labetalol,pain med if needed      STOP all herbel meds, nsaids (aleve,naproxen,advil,ibuprofen) 5 days prior to surgery including vitamins, aspirin, fish oil   Do not wear jewelry-no rings or watches.  Do not wear lotions, colognes.  You may NOT wear deodorant.   Men may shave face and neck.   Do not bring valuables to the hospital.  [facility] is not responsible for any belongings or valuables.               Contacts, dentures or bridgework may not be worn into surgery.  Leave suitcase in the car. After surgery it may be brought to your room.  For patients admitted to the hospital, discharge time is determined by your treatment team.    Name and phone number of your driver:  Special Instructions:  Special Instructions: [place] - Preparing for Surgery   Please read over the following fact sheets that you were given: Pain Booklet, Coughing and Deep Breathing, Blood Transfusion Information, MRSA Information and Surgical Site Infection Prevention

## 2014-03-11 ENCOUNTER — Inpatient Hospital Stay (HOSPITAL_COMMUNITY)
Admission: RE | Admit: 2014-03-11 | Discharge: 2014-03-11 | Disposition: A | Discharge status: Home / Self Care | Payer: BC Managed Care – PPO | Source: Ambulatory Visit

## 2014-03-11 NOTE — Progress Notes (Signed)
9:20 am  No Show for 9 am PAT appt.  I have tried both phone numbers without success.  DA

## 2014-03-16 ENCOUNTER — Encounter (HOSPITAL_COMMUNITY)
Admission: RE | Admit: 2014-03-16 | Discharge: 2014-03-16 | Disposition: A | Discharge status: Home / Self Care | Payer: BC Managed Care – PPO | Source: Ambulatory Visit | Attending: Orthopedic Surgery | Admitting: Orthopedic Surgery

## 2014-03-16 ENCOUNTER — Encounter (HOSPITAL_COMMUNITY): Payer: Self-pay

## 2014-03-16 LAB — CBC WITH DIFFERENTIAL/PLATELET
BASOS ABS: 0 10*3/uL (ref 0.0–0.1)
BASOS PCT: 0 % (ref 0–1)
EOS ABS: 0.2 10*3/uL (ref 0.0–0.7)
Eosinophils Relative: 2 % (ref 0–5)
HCT: 34.2 % — ABNORMAL LOW (ref 39.0–52.0)
HEMOGLOBIN: 11.3 g/dL — AB (ref 13.0–17.0)
Lymphocytes Relative: 22 % (ref 12–46)
Lymphs Abs: 1.7 10*3/uL (ref 0.7–4.0)
MCH: 21 pg — AB (ref 26.0–34.0)
MCHC: 33 g/dL (ref 30.0–36.0)
MCV: 63.5 fL — ABNORMAL LOW (ref 78.0–100.0)
MONOS PCT: 12 % (ref 3–12)
Monocytes Absolute: 1 10*3/uL (ref 0.1–1.0)
NEUTROS PCT: 63 % (ref 43–77)
Neutro Abs: 5 10*3/uL (ref 1.7–7.7)
Platelets: 130 10*3/uL — ABNORMAL LOW (ref 150–400)
RBC: 5.39 MIL/uL (ref 4.22–5.81)
RDW: 16.4 % — AB (ref 11.5–15.5)
WBC: 8 10*3/uL (ref 4.0–10.5)

## 2014-03-16 LAB — TYPE AND SCREEN
ABO/RH(D): A POS
ANTIBODY SCREEN: NEGATIVE

## 2014-03-16 LAB — COMPREHENSIVE METABOLIC PANEL
ALBUMIN: 4.2 g/dL (ref 3.5–5.2)
ALT: 20 U/L (ref 0–53)
AST: 37 U/L (ref 0–37)
Alkaline Phosphatase: 109 U/L (ref 39–117)
BUN: 22 mg/dL (ref 6–23)
CO2: 25 mEq/L (ref 19–32)
CREATININE: 1.02 mg/dL (ref 0.50–1.35)
Calcium: 10 mg/dL (ref 8.4–10.5)
Chloride: 95 mEq/L — ABNORMAL LOW (ref 96–112)
GFR calc Af Amer: >90 mL/min (ref >90)
GFR calc non Af Amer: 80 mL/min — ABNORMAL LOW (ref >90)
Glucose, Bld: 117 mg/dL — ABNORMAL HIGH (ref 70–99)
POTASSIUM: 3.4 meq/L — AB (ref 3.7–5.3)
Sodium: 139 mEq/L (ref 137–147)
TOTAL PROTEIN: 8.2 g/dL (ref 6.0–8.3)
Total Bilirubin: 1.3 mg/dL — ABNORMAL HIGH (ref 0.3–1.2)

## 2014-03-16 LAB — APTT: APTT: 30 s (ref 24–37)

## 2014-03-16 LAB — URINE MICROSCOPIC-ADD ON

## 2014-03-16 LAB — SURGICAL PCR SCREEN
MRSA, PCR: NEGATIVE
STAPHYLOCOCCUS AUREUS: NEGATIVE

## 2014-03-16 LAB — URINALYSIS, ROUTINE W REFLEX MICROSCOPIC
BILIRUBIN URINE: NEGATIVE
Glucose, UA: NEGATIVE mg/dL
Hgb urine dipstick: NEGATIVE
KETONES UR: 15 mg/dL — AB
NITRITE: POSITIVE — AB
Specific Gravity, Urine: 1.041 — ABNORMAL HIGH (ref 1.005–1.030)
UROBILINOGEN UA: 1 mg/dL (ref 0.0–1.0)
pH: 5.5 (ref 5.0–8.0)

## 2014-03-16 LAB — PROTIME-INR
INR: 0.96 (ref 0.00–1.49)
Prothrombin Time: 12.6 seconds (ref 11.6–15.2)

## 2014-03-17 LAB — ABO/RH: ABO/RH(D): A POS

## 2014-03-18 MED ORDER — CHLORHEXIDINE GLUCONATE 4 % EX LIQD
60.0000 mL | Freq: Once | CUTANEOUS | Status: DC
  Filled 2014-03-18: qty 60

## 2014-03-18 MED ORDER — DEXTROSE 5 % IV SOLN
3.0000 g | INTRAVENOUS | Status: AC
  Administered 2014-03-19: 3 g via INTRAVENOUS
  Filled 2014-03-18: qty 3000

## 2014-03-19 ENCOUNTER — Inpatient Hospital Stay (HOSPITAL_COMMUNITY): Payer: BC Managed Care – PPO | Admitting: Anesthesiology

## 2014-03-19 ENCOUNTER — Encounter (HOSPITAL_COMMUNITY)
Admission: RE | Disposition: A | Discharge status: Home Health | Payer: Self-pay | Source: Ambulatory Visit | Attending: Orthopedic Surgery

## 2014-03-19 ENCOUNTER — Inpatient Hospital Stay (HOSPITAL_COMMUNITY)
Admission: RE | Admit: 2014-03-19 | Discharge: 2014-03-21 | DRG: 470 | Disposition: A | Discharge status: Home Health | Payer: BC Managed Care – PPO | Source: Ambulatory Visit | Attending: Orthopedic Surgery | Admitting: Orthopedic Surgery

## 2014-03-19 ENCOUNTER — Encounter (HOSPITAL_COMMUNITY): Payer: Self-pay | Admitting: *Deleted

## 2014-03-19 ENCOUNTER — Encounter (HOSPITAL_COMMUNITY): Payer: BC Managed Care – PPO | Admitting: Anesthesiology

## 2014-03-19 DIAGNOSIS — M1710 Unilateral primary osteoarthritis, unspecified knee: Principal | ICD-10-CM | POA: Diagnosis present

## 2014-03-19 DIAGNOSIS — Z87891 Personal history of nicotine dependence: Secondary | ICD-10-CM

## 2014-03-19 DIAGNOSIS — M1712 Unilateral primary osteoarthritis, left knee: Secondary | ICD-10-CM

## 2014-03-19 DIAGNOSIS — Z79899 Other long term (current) drug therapy: Secondary | ICD-10-CM

## 2014-03-19 DIAGNOSIS — I10 Essential (primary) hypertension: Secondary | ICD-10-CM | POA: Diagnosis present

## 2014-03-19 HISTORY — PX: TOTAL KNEE ARTHROPLASTY: SHX125

## 2014-03-19 SURGERY — ARTHROPLASTY, KNEE, TOTAL
Anesthesia: General | Site: Knee | Laterality: Left

## 2014-03-19 MED ORDER — ONDANSETRON HCL 4 MG/2ML IJ SOLN
INTRAMUSCULAR | Status: AC
  Filled 2014-03-19: qty 2

## 2014-03-19 MED ORDER — PROMETHAZINE HCL 25 MG/ML IJ SOLN: 12.5000 mg | Freq: Four times a day (QID) | INTRAMUSCULAR | Status: DC | PRN

## 2014-03-19 MED ORDER — HYDROMORPHONE HCL PF 1 MG/ML IJ SOLN
0.2500 mg | INTRAMUSCULAR | Status: DC | PRN
  Administered 2014-03-19 (×4): 0.5 mg via INTRAVENOUS

## 2014-03-19 MED ORDER — DIPHENHYDRAMINE HCL 12.5 MG/5ML PO ELIX: 12.5000 mg | ORAL_SOLUTION | ORAL | Status: DC | PRN

## 2014-03-19 MED ORDER — ONDANSETRON HCL 4 MG/2ML IJ SOLN
4.0000 mg | Freq: Four times a day (QID) | INTRAMUSCULAR | Status: DC | PRN
Start: 2014-03-19 — End: 2014-03-21

## 2014-03-19 MED ORDER — MIDAZOLAM HCL 2 MG/2ML IJ SOLN
INTRAMUSCULAR | Status: AC
  Administered 2014-03-19: 2 mg
  Filled 2014-03-19: qty 2

## 2014-03-19 MED ORDER — SODIUM CHLORIDE 0.9 % IJ SOLN
INTRAMUSCULAR | Status: AC
  Filled 2014-03-19: qty 10

## 2014-03-19 MED ORDER — FENTANYL CITRATE 0.05 MG/ML IJ SOLN
INTRAMUSCULAR | Status: AC
  Filled 2014-03-19: qty 5

## 2014-03-19 MED ORDER — GLYCOPYRROLATE 0.2 MG/ML IJ SOLN
INTRAMUSCULAR | Status: DC | PRN
  Administered 2014-03-19: 0.1 mg via INTRAVENOUS

## 2014-03-19 MED ORDER — FENTANYL CITRATE 0.05 MG/ML IJ SOLN
INTRAMUSCULAR | Status: AC
  Administered 2014-03-19: 100 ug
  Filled 2014-03-19: qty 2

## 2014-03-19 MED ORDER — EPHEDRINE SULFATE 50 MG/ML IJ SOLN
INTRAMUSCULAR | Status: AC
  Filled 2014-03-19: qty 1

## 2014-03-19 MED ORDER — MEPERIDINE HCL 25 MG/ML IJ SOLN: 6.2500 mg | INTRAMUSCULAR | Status: DC | PRN

## 2014-03-19 MED ORDER — METHOCARBAMOL 500 MG PO TABS
500.0000 mg | ORAL_TABLET | Freq: Four times a day (QID) | ORAL | Status: DC | PRN
  Administered 2014-03-19 – 2014-03-21 (×6): 500 mg via ORAL
  Filled 2014-03-19 (×6): qty 1

## 2014-03-19 MED ORDER — SODIUM CHLORIDE 0.9 % IR SOLN
Status: DC | PRN
  Administered 2014-03-19: 1000 mL

## 2014-03-19 MED ORDER — CLONIDINE HCL 0.1 MG PO TABS
0.1000 mg | ORAL_TABLET | Freq: Two times a day (BID) | ORAL | Status: DC
  Administered 2014-03-19 – 2014-03-21 (×4): 0.1 mg via ORAL
  Filled 2014-03-19 (×5): qty 1

## 2014-03-19 MED ORDER — MIDAZOLAM HCL 2 MG/2ML IJ SOLN
INTRAMUSCULAR | Status: AC
  Administered 2014-03-19: 1 mg via INTRAVENOUS
  Filled 2014-03-19: qty 2

## 2014-03-19 MED ORDER — FENTANYL CITRATE 0.05 MG/ML IJ SOLN: 100.0000 ug | Freq: Once | INTRAMUSCULAR | Status: DC

## 2014-03-19 MED ORDER — GLYCOPYRROLATE 0.2 MG/ML IJ SOLN
INTRAMUSCULAR | Status: AC
  Filled 2014-03-19: qty 1

## 2014-03-19 MED ORDER — ALUM & MAG HYDROXIDE-SIMETH 200-200-20 MG/5ML PO SUSP: 30.0000 mL | ORAL | Status: DC | PRN

## 2014-03-19 MED ORDER — ONDANSETRON HCL 4 MG/2ML IJ SOLN
INTRAMUSCULAR | Status: DC | PRN
Start: 2014-03-19 — End: 2014-03-19
  Administered 2014-03-19: 4 mg via INTRAVENOUS

## 2014-03-19 MED ORDER — METHOCARBAMOL 750 MG PO TABS: 750.0000 mg | ORAL_TABLET | Freq: Three times a day (TID) | ORAL | Status: DC | PRN

## 2014-03-19 MED ORDER — MIDAZOLAM HCL 2 MG/2ML IJ SOLN
INTRAMUSCULAR | Status: AC
  Filled 2014-03-19: qty 2

## 2014-03-19 MED ORDER — LACTATED RINGERS IV SOLN: INTRAVENOUS | Status: DC

## 2014-03-19 MED ORDER — OXYCODONE-ACETAMINOPHEN 5-325 MG PO TABS
ORAL_TABLET | ORAL | Status: AC
  Filled 2014-03-19: qty 2

## 2014-03-19 MED ORDER — TRANEXAMIC ACID 100 MG/ML IV SOLN
1000.0000 mg | INTRAVENOUS | Status: AC
  Administered 2014-03-19: 1000 mg via INTRAVENOUS
  Filled 2014-03-19: qty 10

## 2014-03-19 MED ORDER — ONDANSETRON HCL 4 MG/2ML IJ SOLN: 4.0000 mg | Freq: Once | INTRAMUSCULAR | Status: DC | PRN

## 2014-03-19 MED ORDER — BISACODYL 10 MG RE SUPP: 10.0000 mg | Freq: Every day | RECTAL | Status: DC | PRN

## 2014-03-19 MED ORDER — SODIUM CHLORIDE 0.9 % IJ SOLN
INTRAMUSCULAR | Status: DC | PRN
  Administered 2014-03-19: 40 mL via INTRAVENOUS

## 2014-03-19 MED ORDER — ACETAMINOPHEN 325 MG PO TABS: 650.0000 mg | ORAL_TABLET | Freq: Four times a day (QID) | ORAL | Status: DC | PRN

## 2014-03-19 MED ORDER — OXYCODONE-ACETAMINOPHEN 5-325 MG PO TABS: 1.0000 | ORAL_TABLET | Freq: Four times a day (QID) | ORAL | Status: DC | PRN

## 2014-03-19 MED ORDER — LIDOCAINE HCL (CARDIAC) 20 MG/ML IV SOLN
INTRAVENOUS | Status: AC
  Filled 2014-03-19: qty 5

## 2014-03-19 MED ORDER — HYDROMORPHONE HCL PF 1 MG/ML IJ SOLN
0.5000 mg | INTRAMUSCULAR | Status: AC | PRN
  Administered 2014-03-19 (×2): 0.5 mg via INTRAVENOUS

## 2014-03-19 MED ORDER — POLYETHYLENE GLYCOL 3350 17 G PO PACK
17.0000 g | PACK | Freq: Every day | ORAL | Status: DC | PRN
Start: 2014-03-19 — End: 2014-03-21

## 2014-03-19 MED ORDER — KETOROLAC TROMETHAMINE 15 MG/ML IJ SOLN
15.0000 mg | Freq: Four times a day (QID) | INTRAMUSCULAR | Status: AC
  Administered 2014-03-19 – 2014-03-20 (×4): 15 mg via INTRAVENOUS
  Filled 2014-03-19 (×3): qty 1

## 2014-03-19 MED ORDER — AMLODIPINE BESYLATE 2.5 MG PO TABS
2.5000 mg | ORAL_TABLET | Freq: Two times a day (BID) | ORAL | Status: DC
  Administered 2014-03-19 – 2014-03-21 (×4): 2.5 mg via ORAL
  Filled 2014-03-19 (×5): qty 1

## 2014-03-19 MED ORDER — ASPIRIN EC 325 MG PO TBEC
325.0000 mg | DELAYED_RELEASE_TABLET | Freq: Two times a day (BID) | ORAL | Status: DC
  Administered 2014-03-19 – 2014-03-21 (×4): 325 mg via ORAL
  Filled 2014-03-19 (×6): qty 1

## 2014-03-19 MED ORDER — KETOROLAC TROMETHAMINE 30 MG/ML IJ SOLN
INTRAMUSCULAR | Status: AC
  Filled 2014-03-19: qty 1

## 2014-03-19 MED ORDER — HYDROMORPHONE HCL PF 1 MG/ML IJ SOLN
INTRAMUSCULAR | Status: AC
  Administered 2014-03-19: 0.5 mg via INTRAVENOUS
  Filled 2014-03-19: qty 1

## 2014-03-19 MED ORDER — BUPIVACAINE-EPINEPHRINE PF 0.5-1:200000 % IJ SOLN
INTRAMUSCULAR | Status: DC | PRN
  Administered 2014-03-19: 30 mL via PERINEURAL

## 2014-03-19 MED ORDER — PROPOFOL 10 MG/ML IV BOLUS
INTRAVENOUS | Status: AC
  Filled 2014-03-19: qty 20

## 2014-03-19 MED ORDER — MIDAZOLAM HCL 5 MG/5ML IJ SOLN
INTRAMUSCULAR | Status: DC | PRN
  Administered 2014-03-19: 2 mg via INTRAVENOUS

## 2014-03-19 MED ORDER — CEFAZOLIN SODIUM-DEXTROSE 2-3 GM-% IV SOLR
2.0000 g | Freq: Four times a day (QID) | INTRAVENOUS | Status: AC
  Administered 2014-03-19 (×2): 2 g via INTRAVENOUS
  Filled 2014-03-19 (×2): qty 50

## 2014-03-19 MED ORDER — FUROSEMIDE 40 MG PO TABS
40.0000 mg | ORAL_TABLET | Freq: Every day | ORAL | Status: DC
Start: 2014-03-19 — End: 2014-03-21
  Administered 2014-03-20 – 2014-03-21 (×2): 40 mg via ORAL
  Filled 2014-03-19 (×3): qty 1

## 2014-03-19 MED ORDER — DOCUSATE SODIUM 100 MG PO CAPS
100.0000 mg | ORAL_CAPSULE | Freq: Two times a day (BID) | ORAL | Status: DC
  Administered 2014-03-19 – 2014-03-21 (×5): 100 mg via ORAL
  Filled 2014-03-19 (×6): qty 1

## 2014-03-19 MED ORDER — METHOCARBAMOL 100 MG/ML IJ SOLN
500.0000 mg | Freq: Four times a day (QID) | INTRAVENOUS | Status: DC | PRN
  Filled 2014-03-19: qty 5

## 2014-03-19 MED ORDER — DEXAMETHASONE 6 MG PO TABS
10.0000 mg | ORAL_TABLET | Freq: Three times a day (TID) | ORAL | Status: AC
  Administered 2014-03-19 – 2014-03-20 (×3): 10 mg via ORAL
  Filled 2014-03-19 (×3): qty 1

## 2014-03-19 MED ORDER — MIDAZOLAM HCL 2 MG/ML PO SYRP: 2.0000 mg | ORAL_SOLUTION | Freq: Once | ORAL | Status: DC

## 2014-03-19 MED ORDER — HYDROMORPHONE HCL PF 1 MG/ML IJ SOLN
1.0000 mg | INTRAMUSCULAR | Status: DC | PRN
  Administered 2014-03-19 (×3): 2 mg via INTRAVENOUS
  Administered 2014-03-20 (×2): 1 mg via INTRAVENOUS
  Administered 2014-03-20: 2 mg via INTRAVENOUS
  Administered 2014-03-20: 1 mg via INTRAVENOUS
  Filled 2014-03-19 (×2): qty 1
  Filled 2014-03-19 (×2): qty 2
  Filled 2014-03-19: qty 1
  Filled 2014-03-19: qty 2

## 2014-03-19 MED ORDER — LIDOCAINE HCL (CARDIAC) 20 MG/ML IV SOLN
INTRAVENOUS | Status: DC | PRN
  Administered 2014-03-19: 100 mg via INTRAVENOUS

## 2014-03-19 MED ORDER — ACETAMINOPHEN 650 MG RE SUPP: 650.0000 mg | Freq: Four times a day (QID) | RECTAL | Status: DC | PRN

## 2014-03-19 MED ORDER — LABETALOL HCL 200 MG PO TABS
200.0000 mg | ORAL_TABLET | Freq: Two times a day (BID) | ORAL | Status: DC
  Administered 2014-03-19 – 2014-03-21 (×4): 200 mg via ORAL
  Filled 2014-03-19 (×5): qty 1

## 2014-03-19 MED ORDER — SODIUM CHLORIDE 0.9 % IV SOLN
INTRAVENOUS | Status: DC
  Administered 2014-03-19: 100 mL/h via INTRAVENOUS

## 2014-03-19 MED ORDER — OXYCODONE-ACETAMINOPHEN 5-325 MG PO TABS
1.0000 | ORAL_TABLET | ORAL | Status: DC | PRN
  Administered 2014-03-19 – 2014-03-20 (×9): 2 via ORAL
  Administered 2014-03-21: 1 via ORAL
  Administered 2014-03-21: 2 via ORAL
  Filled 2014-03-19 (×10): qty 2

## 2014-03-19 MED ORDER — MIDAZOLAM HCL 5 MG/ML IJ SOLN: 2.0000 mg | Freq: Once | INTRAMUSCULAR | Status: DC

## 2014-03-19 MED ORDER — MIDAZOLAM HCL 2 MG/2ML IJ SOLN
1.0000 mg | Freq: Once | INTRAMUSCULAR | Status: AC
  Administered 2014-03-19: 1 mg via INTRAVENOUS

## 2014-03-19 MED ORDER — METHOCARBAMOL 500 MG PO TABS
ORAL_TABLET | ORAL | Status: AC
Start: 2014-03-19 — End: 2014-03-20
  Filled 2014-03-19: qty 1

## 2014-03-19 MED ORDER — OXYCODONE HCL 5 MG PO TABS: 5.0000 mg | ORAL_TABLET | Freq: Once | ORAL | Status: DC | PRN

## 2014-03-19 MED ORDER — DEXAMETHASONE SODIUM PHOSPHATE 10 MG/ML IJ SOLN
10.0000 mg | Freq: Three times a day (TID) | INTRAMUSCULAR | Status: AC
  Filled 2014-03-19 (×3): qty 1

## 2014-03-19 MED ORDER — ONDANSETRON HCL 4 MG PO TABS: 4.0000 mg | ORAL_TABLET | Freq: Four times a day (QID) | ORAL | Status: DC | PRN

## 2014-03-19 MED ORDER — LACTATED RINGERS IV SOLN
INTRAVENOUS | Status: DC
  Administered 2014-03-19: 09:00:00 via INTRAVENOUS

## 2014-03-19 MED ORDER — BUPIVACAINE LIPOSOME 1.3 % IJ SUSP
INTRAMUSCULAR | Status: DC | PRN
  Administered 2014-03-19: 20 mL

## 2014-03-19 MED ORDER — HYDROMORPHONE HCL PF 1 MG/ML IJ SOLN
INTRAMUSCULAR | Status: AC
  Filled 2014-03-19: qty 2

## 2014-03-19 MED ORDER — FENTANYL CITRATE 0.05 MG/ML IJ SOLN
INTRAMUSCULAR | Status: DC | PRN
  Administered 2014-03-19: 50 ug via INTRAVENOUS
  Administered 2014-03-19: 100 ug via INTRAVENOUS
  Administered 2014-03-19: 50 ug via INTRAVENOUS
  Administered 2014-03-19 (×2): 100 ug via INTRAVENOUS
  Administered 2014-03-19 (×2): 50 ug via INTRAVENOUS

## 2014-03-19 MED ORDER — PROPOFOL 10 MG/ML IV BOLUS
INTRAVENOUS | Status: DC | PRN
  Administered 2014-03-19: 150 mg via INTRAVENOUS
  Administered 2014-03-19: 50 mg via INTRAVENOUS

## 2014-03-19 MED ORDER — ASPIRIN EC 325 MG PO TBEC: 325.0000 mg | DELAYED_RELEASE_TABLET | Freq: Two times a day (BID) | ORAL | Status: DC

## 2014-03-19 MED ORDER — OXYCODONE HCL 5 MG/5ML PO SOLN: 5.0000 mg | Freq: Once | ORAL | Status: DC | PRN

## 2014-03-19 MED ORDER — LACTATED RINGERS IV SOLN
INTRAVENOUS | Status: DC | PRN
  Administered 2014-03-19 (×2): via INTRAVENOUS

## 2014-03-19 MED ORDER — BUPIVACAINE LIPOSOME 1.3 % IJ SUSP
20.0000 mL | Freq: Once | INTRAMUSCULAR | Status: DC
  Filled 2014-03-19: qty 20

## 2014-03-19 MED ORDER — HYDROMORPHONE HCL PF 1 MG/ML IJ SOLN
INTRAMUSCULAR | Status: AC
  Administered 2014-03-19: 0.5 mg via INTRAVENOUS
  Filled 2014-03-19: qty 2

## 2014-03-19 SURGICAL SUPPLY — 62 items
APL SKNCLS STERI-STRIP NONHPOA (GAUZE/BANDAGES/DRESSINGS) ×1
BANDAGE ESMARK 6X9 LF (GAUZE/BANDAGES/DRESSINGS) ×1 IMPLANT
BENZOIN TINCTURE PRP APPL 2/3 (GAUZE/BANDAGES/DRESSINGS) ×2 IMPLANT
BLADE SAGITTAL 25.0X1.19X90 (BLADE) ×2 IMPLANT
BLADE SAW SAG 90X13X1.27 (BLADE) ×2 IMPLANT
BNDG CMPR 9X6 STRL LF SNTH (GAUZE/BANDAGES/DRESSINGS) ×1
BNDG ESMARK 6X9 LF (GAUZE/BANDAGES/DRESSINGS) ×2
BOWL SMART MIX CTS (DISPOSABLE) ×2 IMPLANT
CAPT RP KNEE ×1 IMPLANT
CEMENT HV SMART SET (Cement) ×4 IMPLANT
CLSR STERI-STRIP ANTIMIC 1/2X4 (GAUZE/BANDAGES/DRESSINGS) ×1 IMPLANT
COVER SURGICAL LIGHT HANDLE (MISCELLANEOUS) ×2 IMPLANT
CUFF TOURNIQUET SINGLE 34IN LL (TOURNIQUET CUFF) ×2 IMPLANT
CUFF TOURNIQUET SINGLE 44IN (TOURNIQUET CUFF) IMPLANT
DRAPE EXTREMITY T 121X128X90 (DRAPE) ×2 IMPLANT
DRAPE U-SHAPE 47X51 STRL (DRAPES) ×2 IMPLANT
DRSG PAD ABDOMINAL 8X10 ST (GAUZE/BANDAGES/DRESSINGS) ×2 IMPLANT
DURAPREP 26ML APPLICATOR (WOUND CARE) ×2 IMPLANT
ELECT REM PT RETURN 9FT ADLT (ELECTROSURGICAL) ×2
ELECTRODE REM PT RTRN 9FT ADLT (ELECTROSURGICAL) ×1 IMPLANT
EVACUATOR 1/8 PVC DRAIN (DRAIN) ×2 IMPLANT
FACESHIELD LNG OPTICON STERILE (SAFETY) ×2 IMPLANT
GAUZE XEROFORM 5X9 LF (GAUZE/BANDAGES/DRESSINGS) ×2 IMPLANT
GLOVE BIOGEL PI IND STRL 8 (GLOVE) ×2 IMPLANT
GLOVE BIOGEL PI INDICATOR 8 (GLOVE) ×2
GLOVE ECLIPSE 7.5 STRL STRAW (GLOVE) ×4 IMPLANT
GOWN STRL REUS W/ TWL LRG LVL3 (GOWN DISPOSABLE) ×1 IMPLANT
GOWN STRL REUS W/ TWL XL LVL3 (GOWN DISPOSABLE) ×2 IMPLANT
GOWN STRL REUS W/TWL LRG LVL3 (GOWN DISPOSABLE) ×2
GOWN STRL REUS W/TWL XL LVL3 (GOWN DISPOSABLE) ×4
HANDPIECE INTERPULSE COAX TIP (DISPOSABLE) ×2
HOOD PEEL AWAY FACE SHEILD DIS (HOOD) ×6 IMPLANT
IMMOBILIZER KNEE 20 (SOFTGOODS) IMPLANT
IMMOBILIZER KNEE 22 UNIV (SOFTGOODS) ×2 IMPLANT
KIT BASIN OR (CUSTOM PROCEDURE TRAY) ×2 IMPLANT
KIT ROOM TURNOVER OR (KITS) ×2 IMPLANT
MANIFOLD NEPTUNE II (INSTRUMENTS) ×2 IMPLANT
NDL HYPO 25GX1X1/2 BEV (NEEDLE) IMPLANT
NEEDLE HYPO 25GX1X1/2 BEV (NEEDLE) IMPLANT
NS IRRIG 1000ML POUR BTL (IV SOLUTION) ×2 IMPLANT
PACK TOTAL JOINT (CUSTOM PROCEDURE TRAY) ×2 IMPLANT
PAD ABD 8X10 STRL (GAUZE/BANDAGES/DRESSINGS) ×1 IMPLANT
PAD ARMBOARD 7.5X6 YLW CONV (MISCELLANEOUS) ×4 IMPLANT
PAD CAST 4YDX4 CTTN HI CHSV (CAST SUPPLIES) ×1 IMPLANT
PADDING CAST COTTON 4X4 STRL (CAST SUPPLIES) ×2
PADDING CAST COTTON 6X4 STRL (CAST SUPPLIES) ×1 IMPLANT
SET HNDPC FAN SPRY TIP SCT (DISPOSABLE) ×1 IMPLANT
SPONGE GAUZE 4X4 12PLY (GAUZE/BANDAGES/DRESSINGS) ×2 IMPLANT
STAPLER VISISTAT 35W (STAPLE) IMPLANT
STRIP CLOSURE SKIN 1/2X4 (GAUZE/BANDAGES/DRESSINGS) ×2 IMPLANT
SUCTION FRAZIER TIP 10 FR DISP (SUCTIONS) ×2 IMPLANT
SUT MNCRL AB 3-0 PS2 18 (SUTURE) ×2 IMPLANT
SUT VIC AB 0 CTB1 27 (SUTURE) ×4 IMPLANT
SUT VIC AB 1 CT1 27 (SUTURE) ×4
SUT VIC AB 1 CT1 27XBRD ANBCTR (SUTURE) ×2 IMPLANT
SUT VIC AB 2-0 CTB1 (SUTURE) ×4 IMPLANT
SYR 50ML LL SCALE MARK (SYRINGE) ×1 IMPLANT
SYR CONTROL 10ML LL (SYRINGE) IMPLANT
TOWEL OR 17X24 6PK STRL BLUE (TOWEL DISPOSABLE) ×2 IMPLANT
TOWEL OR 17X26 10 PK STRL BLUE (TOWEL DISPOSABLE) ×2 IMPLANT
TRAY FOLEY CATH 16FRSI W/METER (SET/KITS/TRAYS/PACK) ×2 IMPLANT
WATER STERILE IRR 1000ML POUR (IV SOLUTION) ×4 IMPLANT

## 2014-03-19 NOTE — Plan of Care (Signed)
Problem: Consults Goal: Diagnosis- Total Joint Replacement Primary Total Knee     

## 2014-03-19 NOTE — Anesthesia Procedure Notes (Addendum)
Anesthesia Regional Block:  Femoral nerve block  Pre-Anesthetic Checklist: ,, timeout performed, Correct Patient, Correct Site, Correct Laterality, Correct Procedure, Correct Position, site marked, Risks and benefits discussed,  Surgical consent,  Pre-op evaluation,  At surgeon's request and post-op pain management  Laterality: Left  Prep: chloraprep       Needles:  Injection technique: Single-shot  Needle Type: Echogenic Stimulator Needle     Needle Length: 9cm 9 cm Needle Gauge: 21 and 21 G    Additional Needles:  Procedures: ultrasound guided (picture in chart) and nerve stimulator Femoral nerve block  Nerve Stimulator or Paresthesia:  Response: 0.4 mA,   Additional Responses:   Narrative:  Start time: 03/19/2014 9:10 AM End time: 03/19/2014 9:25 AM Injection made incrementally with aspirations every 5 mL.  Performed by: Personally  Anesthesiologist: Arta BruceKevin Ossey MD  Additional Notes: Monitors applied. Patient sedated. Sterile prep and drape,hand hygiene and sterile gloves were used. Relevant anatomy identified.Needle position confirmed.Local anesthetic injected incrementally after negative aspiration. Local anesthetic spread visualized around nerve(s). Vascular puncture avoided. No complications. Image printed for medical record.The patient tolerated the procedure well.        Procedure Name: LMA Insertion Date/Time: 03/19/2014 10:41 AM Performed by: Mirissa Lopresti, Jannet AskewHARLESETTA M Pre-anesthesia Checklist: Patient identified, Timeout performed, Emergency Drugs available, Suction available and Patient being monitored Patient Re-evaluated:Patient Re-evaluated prior to inductionOxygen Delivery Method: Circle system utilized Preoxygenation: Pre-oxygenation with 100% oxygen Intubation Type: IV induction LMA: LMA inserted LMA Size: 5.0 Number of attempts: 1 Dental Injury: Teeth and Oropharynx as per pre-operative assessment

## 2014-03-19 NOTE — Progress Notes (Signed)
Orthopedic Tech Progress Note Patient Details:  Rennie PlowmanRoger C Corzine [birth year] [id number]   foot roll   Cammer, Mickie BailJennifer Carol 03/19/2014, 3:11 PM

## 2014-03-19 NOTE — Progress Notes (Signed)
Orthopedic Tech Progress Note Patient Details:  Rennie PlowmanRoger C Wease [birth year] [id number]  CPM Left Knee CPM Left Knee: On Left Knee Flexion (Degrees): 60 Left Knee Extension (Degrees): 0 Additional Comments: Trapeze bar and foot roll   Shawnie PonsCammer, Shenica Holzheimer Carol 03/19/2014, 1:17 PM

## 2014-03-19 NOTE — Progress Notes (Signed)
Patient ambulated in room to door and to window with walker and standby assist.  Strong, steady gait.  Knee immobilizer in place.  Patient tolerated activity well.  Mild dyspnea with activity.  Sat 94% on room air.  Oxygen at 2L reapplied once back in bed.  Placed on CPM 0-60.  Ice to knee.

## 2014-03-19 NOTE — Anesthesia Postprocedure Evaluation (Signed)
Anesthesia Post Note  Patient: Jeremy PlowmanRoger C Douglas  Procedure(s) Performed: Procedure(s) (LRB): LEFT TOTAL KNEE ARTHROPLASTY (Left)  Anesthesia type: general  Patient location: PACU  Post pain: Pain level controlled  Post assessment: Patient's Cardiovascular Status Stable  Last Vitals:  Filed Vitals:   03/19/14 1445  BP: 161/81  Pulse: 64  Temp: 36.6 C  Resp: 21    Post vital signs: Reviewed and stable  Level of consciousness: sedated  Complications: No apparent anesthesia complications

## 2014-03-19 NOTE — H&P (Signed)
TOTAL KNEE ADMISSION H&P  Patient is being admitted for left total knee arthroplasty.  Subjective:  Chief Complaint:left knee pain.  HPI: Jeremy Douglas, 58 y.o. male, has a history of pain and functional disability in the left knee due to arthritis and has failed non-surgical conservative treatments for greater than 12 weeks to includeNSAID's and/or analgesics, corticosteriod injections, viscosupplementation injections, flexibility and strengthening excercises, supervised PT with diminished ADL's post treatment, use of assistive devices, weight reduction as appropriate and activity modification.  Onset of symptoms was gradual, starting 8 years ago with gradually worsening course since that time. The patient noted no past surgery on the left knee(s).  Patient currently rates pain in the left knee(s) at 8 out of 10 with activity. Patient has night pain, worsening of pain with activity and weight bearing, pain that interferes with activities of daily living, pain with passive range of motion, crepitus and joint swelling.  Patient has evidence of subchondral sclerosis, periarticular osteophytes, joint subluxation and joint space narrowing by imaging studies. This patient has had failure of conservative care. There is no active infection.  Patient Active Problem List   Diagnosis Date Noted  . Anemia 09/09/2012  . Depression 09/09/2012  . Hypophosphatemia 09/08/2012  . Transaminitis 09/08/2012  . Diarrhea 09/08/2012  . Tylenol overdose 09/07/2012  . Suicidal overdose 09/07/2012  . Hypertensive urgency 09/07/2012  . HTN (hypertension) 09/07/2012  . Hypokalemia 09/07/2012  . Alcohol dependence 09/07/2012  . Gastritis 09/07/2012  . Esophagitis 09/07/2012  . Thrombocytopenia 09/07/2012  . Chest pain 09/07/2012  . Tobacco use disorder, moderate, in sustained remission 09/10/2008  . HYPERTENSION 09/10/2008  . Obstructive sleep apnea 09/10/2008   Past Medical History  Diagnosis Date  .  Hypertension   . Sleep apnea     pt states he is on bipap at night for sleep apnea  . HTN (hypertension) 09/07/2012  . Alcohol dependence 09/07/2012  . Gastritis 09/07/2012    Per EGD 04/2011  . Esophagitis 09/07/2012    Per EGD 04/2011  . Thrombocytopenia 09/07/2012    Hx of in past.  . Wears glasses     Past Surgical History  Procedure Laterality Date  . Tonsillectomy      as a child  . Rigth knee arthroscopy  06/20/2011  . Upper gi endoscopy    . Colonoscopy    . Knee arthroscopy Left 09/18/2013    Procedure: ARTHROSCOPY KNEE, PARTIAL MEDIAL AND LATERAL MENISCECTOMY, CHONDROPLASTY PATELLA FEMORAL JOINT;  Surgeon: Alta Corning, MD;  Location: [facility];  Service: Orthopedics;  Laterality: Left;    Prescriptions prior to admission  Medication Sig Dispense Refill  . amLODipine (NORVASC) 2.5 MG tablet Take 2.5 mg by mouth 2 (two) times daily.      . B Complex-C (SUPER B COMPLEX PO) Take 1 tablet by mouth daily.      . cholecalciferol (VITAMIN D) 1000 UNITS tablet Take 1,000 Units by mouth daily.      . cloNIDine (CATAPRES) 0.1 MG tablet Take 0.1 mg by mouth 2 (two) times daily.      . furosemide (LASIX) 40 MG tablet Take 40 mg by mouth daily.      Marland Kitchen HYDROcodone-acetaminophen (NORCO) 10-325 MG per tablet Take 1 tablet by mouth 2 (two) times daily.      Marland Kitchen ibuprofen (ADVIL,MOTRIN) 200 MG tablet Take 800 mg by mouth at bedtime.      Marland Kitchen labetalol (NORMODYNE) 200 MG tablet Take 200 mg by mouth 2 (two)  times daily.       Marland Kitchen MAGNESIUM-POTASSIUM PO Take 1 tablet by mouth daily.      . Multiple Vitamin (MULITIVITAMIN WITH MINERALS) TABS Take 1 tablet by mouth daily.       . Omega-3 Fatty Acids (FISH OIL) 1000 MG CAPS Take 1 capsule by mouth daily.      . sildenafil (VIAGRA) 50 MG tablet Take 50 mg by mouth daily as needed for erectile dysfunction.        No Known Allergies  History  Substance Use Topics  . Smoking status: Former Smoker -- 0.80 packs/day for 31 years    Types:  Cigarettes    Quit date: 03/14/2013  . Smokeless tobacco: Never Used  . Alcohol Use: Yes     Comment: -hx abuse      not drank X 10 mos    History reviewed. No pertinent family history.   ROS ROS: I have reviewed the patient's review of systems thoroughly and there are no positive responses as relates to the HPI. Objective:  Physical Exam  Vital signs in last 24 hours: Temp:  [97.9 F (36.6 C)] 97.9 F (36.6 C) (03/27 0849) Pulse Rate:  [57-72] 57 (03/27 0942) Resp:  [11-23] 21 (03/27 0942) BP: (152-184)/(60-77) 164/69 mmHg (03/27 0942) SpO2:  [94 %-98 %] 97 % (03/27 0942) Weight:  [271 lb (122.925 kg)] 271 lb (122.925 kg) (03/27 0849) Well-developed well-nourished patient in no acute distress. Alert and oriented x3 HEENT:within normal limits Cardiac: Regular rate and rhythm Pulmonary: Lungs clear to auscultation Abdomen: Soft and nontender.  Normal active bowel sounds  Musculoskeletal: l knee painful rom + med jt line tender Labs: Recent Results (from the past 2160 hour(s))  SURGICAL PCR SCREEN     Status: None   Collection Time    03/16/14  1:16 PM      Result Value Ref Range   MRSA, PCR NEGATIVE  NEGATIVE   Staphylococcus aureus NEGATIVE  NEGATIVE   Comment:            The Xpert SA Assay (FDA     approved for NASAL specimens     in patients over 78 years of age),     is one component of     a comprehensive surveillance     program.  Test performance has     been validated by [employer] for patients greater     than or equal to 47 year old.     It is not intended     to diagnose infection nor to     guide or monitor treatment.  URINALYSIS, ROUTINE W REFLEX MICROSCOPIC     Status: Abnormal   Collection Time    03/16/14  1:17 PM      Result Value Ref Range   Color, Urine ORANGE (*) YELLOW   Comment: BIOCHEMICALS MAY BE AFFECTED BY COLOR   APPearance HAZY (*) CLEAR   Specific Gravity, Urine 1.041 (*) 1.005 - 1.030   pH 5.5  5.0 - 8.0   Glucose, UA  NEGATIVE  NEGATIVE mg/dL   Hgb urine dipstick NEGATIVE  NEGATIVE   Bilirubin Urine NEGATIVE  NEGATIVE   Ketones, ur 15 (*) NEGATIVE mg/dL   Protein, ur >300 (*) NEGATIVE mg/dL   Urobilinogen, UA 1.0  0.0 - 1.0 mg/dL   Nitrite POSITIVE (*) NEGATIVE   Leukocytes, UA SMALL (*) NEGATIVE  URINE MICROSCOPIC-ADD ON     Status: Abnormal   Collection  Time    03/16/14  1:17 PM      Result Value Ref Range   Squamous Epithelial / LPF RARE  RARE   WBC, UA 7-10  <3 WBC/hpf   RBC / HPF 3-6  <3 RBC/hpf   Bacteria, UA MANY (*) RARE   Casts HYALINE CASTS (*) NEGATIVE   Comment: GRANULAR CAST   Urine-Other MUCOUS PRESENT    APTT     Status: None   Collection Time    03/16/14  1:20 PM      Result Value Ref Range   aPTT 30  24 - 37 seconds  CBC WITH DIFFERENTIAL     Status: Abnormal   Collection Time    03/16/14  1:20 PM      Result Value Ref Range   WBC 8.0  4.0 - 10.5 K/uL   RBC 5.39  4.22 - 5.81 MIL/uL   Hemoglobin 11.3 (*) 13.0 - 17.0 g/dL   HCT 34.2 (*) 39.0 - 52.0 %   MCV 63.5 (*) 78.0 - 100.0 fL   MCH 21.0 (*) 26.0 - 34.0 pg   MCHC 33.0  30.0 - 36.0 g/dL   RDW 16.4 (*) 11.5 - 15.5 %   Platelets 130 (*) 150 - 400 K/uL   Comment: REPEATED TO VERIFY     SPECIMEN CHECKED FOR CLOTS     PLATELET COUNT CONFIRMED BY SMEAR   Neutrophils Relative % 63  43 - 77 %   Neutro Abs 5.0  1.7 - 7.7 K/uL   Lymphocytes Relative 22  12 - 46 %   Lymphs Abs 1.7  0.7 - 4.0 K/uL   Monocytes Relative 12  3 - 12 %   Monocytes Absolute 1.0  0.1 - 1.0 K/uL   Eosinophils Relative 2  0 - 5 %   Eosinophils Absolute 0.2  0.0 - 0.7 K/uL   Basophils Relative 0  0 - 1 %   Basophils Absolute 0.0  0.0 - 0.1 K/uL  COMPREHENSIVE METABOLIC PANEL     Status: Abnormal   Collection Time    03/16/14  1:20 PM      Result Value Ref Range   Sodium 139  137 - 147 mEq/L   Potassium 3.4 (*) 3.7 - 5.3 mEq/L   Chloride 95 (*) 96 - 112 mEq/L   CO2 25  19 - 32 mEq/L   Glucose, Bld 117 (*) 70 - 99 mg/dL   BUN 22  6 - 23 mg/dL    Creatinine, Ser 1.02  0.50 - 1.35 mg/dL   Calcium 10.0  8.4 - 10.5 mg/dL   Total Protein 8.2  6.0 - 8.3 g/dL   Albumin 4.2  3.5 - 5.2 g/dL   AST 37  0 - 37 U/L   ALT 20  0 - 53 U/L   Alkaline Phosphatase 109  39 - 117 U/L   Total Bilirubin 1.3 (*) 0.3 - 1.2 mg/dL   GFR calc non Af Amer 80 (*) >90 mL/min   GFR calc Af Amer >90  >90 mL/min   Comment: (NOTE)     The eGFR has been calculated using the CKD EPI equation.     This calculation has not been validated in all clinical situations.     eGFR's persistently <90 mL/min signify possible Chronic Kidney     Disease.  PROTIME-INR     Status: None   Collection Time    03/16/14  1:20 PM      Result Value Ref  Range   Prothrombin Time 12.6  11.6 - 15.2 seconds   INR 0.96  0.00 - 1.49  TYPE AND SCREEN     Status: None   Collection Time    03/16/14  1:23 PM      Result Value Ref Range   ABO/RH(D) A POS     Antibody Screen NEG     Sample Expiration 03/30/2014    ABO/RH     Status: None   Collection Time    03/16/14  1:23 PM      Result Value Ref Range   ABO/RH(D) A POS      Estimated body mass index is 38.88 kg/(m^2) as calculated from the following:   Height as of this encounter: 5' 10"  (1.778 m).   Weight as of this encounter: 271 lb (122.925 kg).   Imaging Review Plain radiographs demonstrate severe degenerative joint disease of the left knee(s). The overall alignment ismild varus. The bone quality appears to be good for age and reported activity level.  Assessment/Plan:  End stage arthritis, left knee   The patient history, physical examination, clinical judgment of the provider and imaging studies are consistent with end stage degenerative joint disease of the left knee(s) and total knee arthroplasty is deemed medically necessary. The treatment options including medical management, injection therapy arthroscopy and arthroplasty were discussed at length. The risks and benefits of total knee arthroplasty were presented and  reviewed. The risks due to aseptic loosening, infection, stiffness, patella tracking problems, thromboembolic complications and other imponderables were discussed. The patient acknowledged the explanation, agreed to proceed with the plan and consent was signed. Patient is being admitted for inpatient treatment for surgery, pain control, PT, OT, prophylactic antibiotics, VTE prophylaxis, progressive ambulation and ADL's and discharge planning. The patient is planning to be discharged home with home health services

## 2014-03-19 NOTE — Evaluation (Signed)
Physical Therapy Evaluation Patient Details Name: Rennie PlowmanRoger C Hasten MRN: [id number] DOB: 12-08-1956 Today's Date: 03/19/2014   History of Present Illness  Patient is a 58 yo male s/p Lt TKA.  Clinical Impression  Patient presents with problems listed below.  Will benefit from acute PT to maximize independence prior to discharge home with daughter.  Should progress well with PT.    Follow Up Recommendations Home health PT;Supervision/Assistance - 24 hour    Equipment Recommendations  None recommended by PT    Recommendations for Other Services       Precautions / Restrictions Precautions Precautions: Knee Precaution Comments: Reviewed precautions with patient Required Braces or Orthoses: Knee Immobilizer - Left Knee Immobilizer - Left: On when out of bed or walking Restrictions Weight Bearing Restrictions: Yes LLE Weight Bearing: Weight bearing as tolerated      Mobility  Bed Mobility Overal bed mobility: Needs Assistance Bed Mobility: Supine to Sit     Supine to sit: Min assist     General bed mobility comments: Instructed patient on donning KI on LLE.  Verbal cues for bed mobility.  Assist to move LLE off of bed.  Transfers Overall transfer level: Needs assistance Equipment used: Rolling walker (2 wheeled) Transfers: Sit to/from Stand Sit to Stand: Min assist         General transfer comment: Verbal cues for hand placement.  Assist to rise to standing and for balance.  Verbal cues for safe descent into chair.  Ambulation/Gait Ambulation/Gait assistance: Min assist Ambulation Distance (Feet): 20 Feet Assistive device: Rolling walker (2 wheeled) Gait Pattern/deviations: Step-to pattern;Decreased stance time - left;Decreased step length - right;Decreased weight shift to left;Antalgic Gait velocity: Decreased Gait velocity interpretation: Below normal speed for age/gender General Gait Details: Verbal cues for safe use of RW and gait sequence.   Stairs             Wheelchair Mobility    Modified Rankin (Stroke Patients Only)       Balance                                     Pertinent Vitals/Pain Pain 9/10 with ambulation/mobility    Home Living Family/patient expects to be discharged to:: Private residence Living Arrangements: Alone Available Help at Discharge: Family;Available 24 hours/day (Daughter staying with patient at discharge) Type of Home: House (Condo) Home Access: Level entry     Home Layout: Multi-level;Able to live on main level with bedroom/bathroom Home Equipment: Dan HumphreysWalker - 2 wheels;Bedside commode      Prior Function Level of Independence: Independent               Hand Dominance        Extremity/Trunk Assessment   Upper Extremity Assessment: Overall WFL for tasks assessed           Lower Extremity Assessment: LLE deficits/detail   LLE Deficits / Details: Decreased strength/ROM due to pain/surgery  Cervical / Trunk Assessment: Normal  Communication   Communication: No difficulties  Cognition Arousal/Alertness: Awake/alert Behavior During Therapy: WFL for tasks assessed/performed Overall Cognitive Status: Within Functional Limits for tasks assessed                      General Comments      Exercises Total Joint Exercises Ankle Circles/Pumps: AROM;Both;10 reps;Seated      Assessment/Plan    PT Assessment Patient needs continued PT services  PT Diagnosis Difficulty walking;Acute pain   PT Problem List Decreased strength;Decreased range of motion;Decreased activity tolerance;Decreased balance;Decreased mobility;Decreased knowledge of use of DME;Decreased knowledge of precautions;Pain  PT Treatment Interventions DME instruction;Gait training;Functional mobility training;Therapeutic exercise;Patient/family education   PT Goals (Current goals can be found in the Care Plan section) Acute Rehab PT Goals Patient Stated Goal: To go home Sunday PT Goal  Formulation: With patient/family Time For Goal Achievement: 03/26/14 Potential to Achieve Goals: Good    Frequency 7X/week   Barriers to discharge        End of Session Equipment Utilized During Treatment: Gait belt;Left knee immobilizer;Oxygen Activity Tolerance: Patient limited by pain;Patient limited by fatigue Patient left: in chair;with call bell/phone within reach;with nursing/sitter in room;with family/visitor present         Time: 1610-9604 PT Time Calculation (min): 22 min   Charges:   PT Evaluation $Initial PT Evaluation Tier I: 1 Procedure PT Treatments $Gait Training: 8-22 mins   PT G Codes:          Vena Austria 03/19/2014, 6:49 PM  Durenda Hurt. Renaldo Fiddler, [facility] Acute Rehab Services Pager [phone]

## 2014-03-19 NOTE — Discharge Instructions (Signed)
Total Knee Replacement °Care After °Refer to this sheet in the next few weeks. These instructions provide you with information on caring for yourself after your procedure. Your caregiver also may give you specific instructions. Your treatment has been planned according to the most current medical practices, but problems sometimes occur. Call your caregiver if you have any problems or questions after your procedure. °HOME CARE INSTRUCTIONS  °· See a physical therapist as directed by your caregiver. °· Take over-the-counter or prescription medicines for pain, discomfort, or fever only as directed by your caregiver. °· Avoid lifting or driving until you are instructed otherwise. °· If you have been sent home with a continuous passive motion machine, use it as directed by your caregiver. °SEEK MEDICAL CARE IF: °· You have difficulty breathing. °· Your wound is red, swollen, or has become increasingly painful. °· You have pus draining from your wound. °· You have a bad smell coming from your wound. °· You have persistent bleeding from your wound. °· Your wound breaks open after sutures (stitches) or staples have been removed. °SEEK IMMEDIATE MEDICAL CARE IF:  °· You have a fever. °· You have a rash. °· You have pain or swelling in your calf or thigh. °· You have shortness of breath or chest pain. °· Your range of motion in your knee is decreasing rather than increasing. °MAKE SURE YOU:  °· Understand these instructions. °· Will watch your condition. °· Will get help right away if you are not doing well or get worse. °Document Released: 06/29/2005 Document Revised: 06/10/2012 Document Reviewed: 01/29/2012 °[employer]® Patient Information ©2014 [employer]. ° °

## 2014-03-19 NOTE — Anesthesia Preprocedure Evaluation (Signed)
Anesthesia Evaluation  Patient identified by MRN, date of birth, ID band Patient awake    Reviewed: Allergy & Precautions, H&P , NPO status , Patient's Chart, lab work & pertinent test results  Airway Mallampati: II TM Distance: >3 FB Neck ROM: Full    Dental   Pulmonary sleep apnea and Continuous Positive Airway Pressure Ventilation , former smoker,          Cardiovascular hypertension, Pt. on medications     Neuro/Psych Depression    GI/Hepatic   Endo/Other    Renal/GU      Musculoskeletal   Abdominal   Peds  Hematology   Anesthesia Other Findings   Reproductive/Obstetrics                           Anesthesia Physical Anesthesia Plan  ASA: III  Anesthesia Plan: General   Post-op Pain Management:    Induction: Intravenous  Airway Management Planned: LMA  Additional Equipment:   Intra-op Plan:   Post-operative Plan: Extubation in OR  Informed Consent: I have reviewed the patients History and Physical, chart, labs and discussed the procedure including the risks, benefits and alternatives for the proposed anesthesia with the patient or authorized representative who has indicated his/her understanding and acceptance.     Plan Discussed with: CRNA and Surgeon  Anesthesia Plan Comments:         Anesthesia Quick Evaluation

## 2014-03-19 NOTE — Brief Op Note (Signed)
03/19/2014  12:15 PM  PATIENT:  Rennie Plowmanoger C Osterman  58 y.o. male  PRE-OPERATIVE DIAGNOSIS:  DEGENERATIVE JOINT DISEASE  POST-OPERATIVE DIAGNOSIS:  DEGENERATIVE JOINT DISEASE  PROCEDURE:  Procedure(s): LEFT TOTAL KNEE ARTHROPLASTY (Left)  SURGEON:  Surgeon(s) and Role:    * [name] Yeraldine Forney, MD - Primary  PHYSICIAN ASSISTANT:   ASSISTANTS: bethune   ANESTHESIA:   general  EBL:  Total I/O In: 1300 [I.V.:1300] Out: -   BLOOD ADMINISTERED:none  DRAINS: (1) Hemovact drain(s) in the l knee with  Suction Open   LOCAL MEDICATIONS USED:  OTHER experel  SPECIMEN:  No Specimen  DISPOSITION OF SPECIMEN:  N/A  COUNTS:  YES  TOURNIQUET:   Total Tourniquet Time Documented: Thigh (Left) - 60 minutes Total: Thigh (Left) - 60 minutes   DICTATION: .Other Dictation: Dictation Number [id number]  PLAN OF CARE: Admit to inpatient   PATIENT DISPOSITION:  PACU - hemodynamically stable.   Delay start of Pharmacological VTE agent (>24hrs) due to surgical blood loss or risk of bleeding: no

## 2014-03-19 NOTE — Progress Notes (Signed)
Utilization review completed.  

## 2014-03-19 NOTE — Transfer of Care (Signed)
Immediate Anesthesia Transfer of Care Note  Patient: Jeremy PlowmanRoger C Odowd  Procedure(s) Performed: Procedure(s): LEFT TOTAL KNEE ARTHROPLASTY (Left)  Patient Location: PACU  Anesthesia Type:General  Level of Consciousness: awake, alert  and oriented  Airway & Oxygen Therapy: Patient Spontanous Breathing and Patient connected to face mask oxygen  Post-op Assessment: Report given to PACU RN, Post -op Vital signs reviewed and stable and Patient moving all extremities X 4  Post vital signs: Reviewed and stable  Complications: No apparent anesthesia complications

## 2014-03-20 LAB — CBC
HCT: 26.5 % — ABNORMAL LOW (ref 39.0–52.0)
HEMOGLOBIN: 8.6 g/dL — AB (ref 13.0–17.0)
MCH: 20.8 pg — AB (ref 26.0–34.0)
MCHC: 32.5 g/dL (ref 30.0–36.0)
MCV: 64.2 fL — ABNORMAL LOW (ref 78.0–100.0)
Platelets: 119 10*3/uL — ABNORMAL LOW (ref 150–400)
RBC: 4.13 MIL/uL — ABNORMAL LOW (ref 4.22–5.81)
RDW: 16.5 % — ABNORMAL HIGH (ref 11.5–15.5)
WBC: 9.1 10*3/uL (ref 4.0–10.5)

## 2014-03-20 LAB — BASIC METABOLIC PANEL
BUN: 13 mg/dL (ref 6–23)
CO2: 22 mEq/L (ref 19–32)
Calcium: 8.9 mg/dL (ref 8.4–10.5)
Chloride: 96 mEq/L (ref 96–112)
Creatinine, Ser: 0.78 mg/dL (ref 0.50–1.35)
GFR calc Af Amer: >90 mL/min (ref >90)
GFR calc non Af Amer: >90 mL/min (ref >90)
GLUCOSE: 184 mg/dL — AB (ref 70–99)
Potassium: 3.8 mEq/L (ref 3.7–5.3)
Sodium: 134 mEq/L — ABNORMAL LOW (ref 137–147)

## 2014-03-20 MED ORDER — FUROSEMIDE 20 MG PO TABS
20.0000 mg | ORAL_TABLET | Freq: Every day | ORAL | Status: DC
  Administered 2014-03-20: 20 mg via ORAL
  Filled 2014-03-20 (×2): qty 1

## 2014-03-20 MED ORDER — DIAZEPAM 5 MG PO TABS
5.0000 mg | ORAL_TABLET | Freq: Every evening | ORAL | Status: DC | PRN
  Administered 2014-03-20: 5 mg via ORAL
  Filled 2014-03-20: qty 1

## 2014-03-20 MED ORDER — DIAZEPAM 5 MG PO TABS
5.0000 mg | ORAL_TABLET | Freq: Once | ORAL | Status: AC
  Administered 2014-03-20: 5 mg via ORAL
  Filled 2014-03-20: qty 1

## 2014-03-20 NOTE — Progress Notes (Signed)
Jason CoopKayla McKenzie, PA aware of pt's BP results being elevated.  Pt stated early to PA during rounds that pain medication can "hype me up". PA ordered to discontinue IV Dilaudid and to continue routine monitoring.  If BP results remains elevated overnight, PA to consult pt's PCP regarding BP results. Orders received and carried out.

## 2014-03-20 NOTE — Op Note (Signed)
Jeremy Douglas, Jeremy Douglas              ACCOUNT NO.:  [id number]  MEDICAL RECORD NO.:  [id number]  LOCATION:  5N30C                        FACILITY:  [facility]  PHYSICIAN:  Harvie Junior, M.D.   DATE OF BIRTH:  Nov 14, 1956  DATE OF PROCEDURE:  03/19/2014 DATE OF DISCHARGE:                              OPERATIVE REPORT   PREOPERATIVE DIAGNOSIS:  End-stage degenerative joint disease, left knee.  POSTOPERATIVE DIAGNOSIS:  End-stage degenerative joint disease, left knee.  PROCEDURE:  Left total knee replacement with Sigma system, size 4 femur, size 4 tibia, 12.5 mm bridging bearing, and a 35 mm all-polyethylene patella.  SURGEON:  Harvie Junior, M.D.  ASSISTANT:  Marshia Ly, P.A.  ANESTHESIA:  General.  BRIEF HISTORY:  Jeremy Douglas is a 58 year old male with a long history of significant complaints of bilateral knee pain, left greater than right.  He had x-ray showing bone-on-bone change, he is having night pain, light activity pain, and he failed all conservative care including injection therapy, activity modification, physical therapy.  After failure of all conservative care, taken to operating room for left total knee replacement.  DESCRIPTION OF PROCEDURE:  The patient was taken to the operative site. After adequate anesthesia was obtained with general anesthetic, the patient was placed supine on the operating table.  The left leg was prepped and draped in usual sterile fashion.  Following this, the midline incision was made in the subcutaneous tissue down to the level extensor mechanism, medial parapatellar arthrotomy was undertaken.  Once this was done, the medial and lateral meniscus were removed, retropatellar fat pad, synovium in the anterior aspect femur, and anterior and posterior cruciates.  The tibia was then exposed and cut perpendicular to its long axis with an extramedullary guide. Intramedullary hole was drilled in the femur, and the femur was cut perpendicular  and assured with a 5-degree valgus inclination.  Once this was done, spacer blocks were put in place.  The knee comes to full extension.  At this point, the femur was measured to a 4, anterior and posterior cuts were made, chamfer and box.  Attention was then turned to the tibia which was drilled and keeled.  Trials were put in place.  The tibia could not come into full extension at this point, so we had to recut the tibia with a little bit more, bone taken off the tibia, and this gave [place] a very nice extension gap and flexion gap was symmetric. At this point, the trials were put in place.  The patella was cut down to a level of 13 mm.  The lugs were drilled for the patella and for the femur and trial patella was put in place.  Knee was put through a range of motion, stability was checked in flexion and extension and excellent gap balance, range of motion, and stability were achieved.  At this point, the trials were removed.  The knee was copiously and thoroughly lavaged with pulsatile lavage irrigation and suctioned dry.  The final components then cemented into place, size 4 tibia, size 4 femur, 12.5 mm bridging bearing trial, and a 38 mm all-polyethylene patella were held in place with a with a clamp.  All excess  bone cement was removed and the cement allowed to harden.  Once hardened, the tourniquet was let down.  All bleeding controlled with electrocautery.  The final 12.5 poly was placed and excellent range of motion and stability were achieved. At this point, the medium Hemovac drain was placed.  The knee was then copiously and thoroughly lavaged, suctioned dry, and the medial parapatellar arthrotomy was closed with 1 Vicryl running over a Hemovac drain.  The skin was closed with 0 and 2-0 Vicryl and 3-0 Monocryl subcuticular.  Benzoin and Steri-Strips were applied, sterile compressive dressing was applied.  The patient was taken to recovery room and noted to be in satisfactory  condition.  Estimated blood loss for the procedure was minimal.     [name]. Clydia Nieves, M.D.     Ranae PlumberJLG/MEDQ  D:  03/19/2014  T:  03/20/2014  Job:  [id number]

## 2014-03-20 NOTE — Progress Notes (Signed)
1 Day PO L TKR per Dr Luiz BlareGraves. Pt reports doing wonderfully, minimal pain, has been up to bathroom and chair and is thrilled with progress, tolerating PO's and NL BM/urination w/o difficulty.   BP 169/69  Pulse 87  Temp(Src) 98.3 F (36.8 C) (Oral)  Resp 18  Ht 5\' 10"  (1.778 m)  Wt 122.925 kg (271 lb)  BMI 38.88 kg/m2  SpO2 93%  Drain output 1150  Pt laying in hospital chair eating breakfast comfortably, L knee immobilizer in place, drain in place, removed w/o difficulty. Dressing CDI. NVI 2+ DPP = BIL. Intact sensation distal extremities  PO Day 1 S/P L TKR  Per Dr Luiz BlareGraves doing very well  -Pain well controlled cont current regimen  -PT/OT  -CPM L knee  -SCD's while in bed, progressive ambulation   -Likely D/C home Sunday with [facility]  -Dressing change Sunday, drain removed today w/o difficulty  -F/U Graves 2 weeks PO

## 2014-03-20 NOTE — Progress Notes (Addendum)
Physical Therapy Treatment Patient Details Name: Jeremy PlowmanRoger C Goyne MRN: [id number] DOB: 1956-06-04 Today's Date: 03/20/2014    History of Present Illness Patient is a 58 yo male s/p Lt TKA.    PT Comments    Continues to progress. On target for DC tomorrow.    Follow Up Recommendations        Equipment Recommendations       Recommendations for Other Services       Precautions / Restrictions Precautions Precautions: Knee Precaution Comments: Reviewed precautions with patient Required Braces or Orthoses: Knee Immobilizer - Left Knee Immobilizer - Left: On when out of bed or walking Restrictions Weight Bearing Restrictions: Yes LLE Weight Bearing: Weight bearing as tolerated    Mobility  Bed Mobility Overal bed mobility: Modified Independent                Transfers Overall transfer level: Needs assistance Equipment used: Rolling walker (2 wheeled) Transfers: Sit to/from Stand Sit to Stand: Supervision While in the therapy gym pt requested to practice a tub transfer with the tub bench like he has at home.  Pt able to perform with Supervision and Verbal cues for technique.             General transfer comment: demonstrated safe technique  Ambulation/Gait Ambulation/Gait assistance: Supervision Ambulation Distance (Feet): 100 Feet Assistive device: Rolling walker (2 wheeled) Gait Pattern/deviations: Step-through pattern     General Gait Details: demonstrated proper technique. More fluid gait pattern this pm.   Stairs Stairs: Yes Stairs assistance: Supervision Stair Management: One rail Right;Forwards Number of Stairs: 3 (performed twice)    Wheelchair Mobility    Modified Rankin (Stroke Patients Only)       Balance                                    Cognition Arousal/Alertness: Awake/alert Behavior During Therapy: WFL for tasks assessed/performed Overall Cognitive Status: Within Functional Limits for tasks assessed                       Exercises Total Joint Exercises Ankle Circles/Pumps: AROM;Both;10 reps;Seated Quad Sets: AROM;Left;Supine;10 reps Short Arc Quad: AROM;Left;10 reps;Supine Heel Slides: Left;10 reps;AAROM;Supine Hip ABduction/ADduction: AROM;Left;10 reps;Supine Straight Leg Raises: AAROM;Left;10 reps;Supine Long Arc Quad: Left;10 reps;Seated;AROM Knee Flexion: AROM;Left;Seated Goniometric ROM: 95 degrees flexion AAROM    General Comments General comments (skin integrity, edema, etc.): Pt feels he will be ready for DC tomorrow.  Bloddy drainage from top of bandage. RN aware.       Pertinent Vitals/Pain 9/10 left knee pain with exercise.  Exercise to pt tolerance.  RN gave pain meds during session.      Home Living                      Prior Function            PT Goals (current goals can now be found in the care plan section)      Frequency       PT Plan Current plan remains appropriate    End of Session Equipment Utilized During Treatment: Left knee immobilizer;Gait belt Activity Tolerance: Patient tolerated treatment well Patient left: in chair;with call bell/phone within reach     Time: 1350-1416 PT Time Calculation (min): 26 min  Charges:  $Gait Training: 8-22 mins $Therapeutic Exercise: 8-22 mins  G Codes:      Donnella Sham 04-13-14, 3:03 PM Lavona Mound, PT  [phone] April 13, 2014

## 2014-03-20 NOTE — Progress Notes (Signed)
Physical Therapy Treatment Patient Details Name: Rennie PlowmanRoger C Bitterman MRN: [id number] DOB: 03/27/1956 Today's Date: 03/20/2014    History of Present Illness Patient is a 58 yo male s/p Lt TKA.    PT Comments    Pt progressing very well. Anticipate he will be ready for DC Sunday.    Follow Up Recommendations        Equipment Recommendations       Recommendations for Other Services       Precautions / Restrictions Precautions Precautions: Knee Precaution Comments: Reviewed precautions with patient Required Braces or Orthoses: Knee Immobilizer - Left Knee Immobilizer - Left: On when out of bed or walking Restrictions Weight Bearing Restrictions: Yes LLE Weight Bearing: Weight bearing as tolerated    Mobility  Bed Mobility Overal bed mobility: Modified Independent                Transfers Overall transfer level: Needs assistance Equipment used: Rolling walker (2 wheeled) Transfers: Sit to/from Stand Sit to Stand: Supervision         General transfer comment: demonstrated safe technique  Ambulation/Gait Ambulation/Gait assistance: Min guard Ambulation Distance (Feet): 80 Feet Assistive device: Rolling walker (2 wheeled) Gait Pattern/deviations: Step-to pattern     General Gait Details: demonstrated proper technique   Stairs            Wheelchair Mobility    Modified Rankin (Stroke Patients Only)       Balance                                    Cognition Arousal/Alertness: Awake/alert Behavior During Therapy: WFL for tasks assessed/performed Overall Cognitive Status: Within Functional Limits for tasks assessed                      Exercises Total Joint Exercises Ankle Circles/Pumps: AROM;Both;10 reps;Seated Quad Sets: AROM;Left;5 reps;Supine Short Arc Quad: AROM;Left;10 reps;Supine Heel Slides: Left;10 reps;AAROM;Supine Hip ABduction/ADduction: AROM;Left;10 reps;Supine Straight Leg Raises: AAROM;Left;10  reps;Supine Long Arc Quad: AAROM;Left;10 reps;Seated Knee Flexion: AROM;Left;Seated Goniometric ROM: 95 degrees flexion AAROM    General Comments General comments (skin integrity, edema, etc.): Pt feels he will be ready for DC tomorrow.  Bloddy drainage from top of bandage. RN aware.       Pertinent Vitals/Pain Increased pain with exercise and ok to continue.  RN gave pain meds during session.      Home Living                      Prior Function            PT Goals (current goals can now be found in the care plan section)      Frequency       PT Plan Current plan remains appropriate    End of Session Equipment Utilized During Treatment: Left knee immobilizer Activity Tolerance: Patient tolerated treatment well Pt left in recliner with RN present.  Pt with phone and call bell, positioned comfortably     Time: 4098-11910955-1031 PT Time Calculation (min): 36 min  Charges:  $Gait Training: 8-22 mins $Therapeutic Exercise: 8-22 mins                    G Codes:      Donnella ShamSawulski, Mateen Franssen J 03/20/2014, 12:33 PM Lavona MoundMark Elyce Zollinger, PT  [phone] 03/20/2014

## 2014-03-21 LAB — CBC
HCT: 24.6 % — ABNORMAL LOW (ref 39.0–52.0)
Hemoglobin: 8.1 g/dL — ABNORMAL LOW (ref 13.0–17.0)
MCH: 21 pg — AB (ref 26.0–34.0)
MCHC: 32.9 g/dL (ref 30.0–36.0)
MCV: 63.9 fL — ABNORMAL LOW (ref 78.0–100.0)
PLATELETS: 143 10*3/uL — AB (ref 150–400)
RBC: 3.85 MIL/uL — AB (ref 4.22–5.81)
RDW: 16.8 % — ABNORMAL HIGH (ref 11.5–15.5)
WBC: 13.8 10*3/uL — ABNORMAL HIGH (ref 4.0–10.5)

## 2014-03-21 NOTE — Care Management Note (Signed)
    Page 1 of 1   03/21/2014     6:31:05 PM   CARE MANAGEMENT NOTE 03/21/2014  Patient:  Jeremy Douglas   Account Number:  [id number]  Date Initiated:  03/21/2014  Documentation initiated by:  [facility]  Subjective/Objective Assessment:   adm: LRB     Action/Plan:   discharge planning   Anticipated DC Date:  03/14/2014   Anticipated DC Plan:  [place]  CM consult      Choice offered to / List presented to:     DME arranged  CPM      DME agency  [employer]     HH arranged  HH-2 PT  HH-3 OT      Status of service:  Completed, signed off Medicare Important Message given?   (If response is "NO", the following Medicare IM given date fields will be blank) Date Medicare IM given:   Date Additional Medicare IM given:    Discharge Disposition:  [place]  Per UR Regulation:    If discussed at Long Length of Stay Meetings, dates discussed:    Comments:  03/21/14 08:50 Cm met pt in room who states he has already been set up with [facility] pre-operatively and TnT for CPM.  He states he has no other needs.  No other Cm needs were communicated.  Mariane Masters, BSN, CM [phone].

## 2014-03-21 NOTE — Progress Notes (Signed)
2 Day PO L TKR per Dr Luiz BlareGraves. Pt reports doing well, slight increase pain after PT/OT and twisting awkwardly last night, has been up to bathroom and chair and is thrilled with progress, tolerating PO's and NL BM/urination w/o difficulty. Pt with HTN but asymptomatic, states he takes Lasix at night at home as well, meds adjusted last night, he will cont to monitor and F/U with his HTN specialist after D/C if ongoing elevation   BP 170/64  Pulse 69  Temp(Src) 97.8 F (36.6 C) (Oral)  Resp 18  Ht 5\' 10"  (1.778 m)  Wt 122.925 kg (271 lb)  BMI 38.88 kg/m2  SpO2 98%  Pt sitting in hospital chair comfortably, L knee immobilizer in place, Dressing CDI. NVI 2+ DPP = BIL. Intact sensation distal extremities   PO Day 2 S/P L TKR Per Dr Luiz BlareGraves doing very well   -Pain well controlled cont current regimen   -complete PT/OT   -CPM L knee   -SCD's while in bed, progressive ambulation   [facility]   -Dressing change per nursing today  -F/U Graves 2 weeks PO

## 2014-03-21 NOTE — Progress Notes (Signed)
Physical Therapy Treatment Patient Details Name: Jeremy Douglas MRN: [id number] DOB: 09/03/1956 Today's Date: 04-18-2014    History of Present Illness Patient is a 58 yo male s/p Lt TKA.    PT Comments    Pt doing very well from a mobility standpoint.  He knows his exercises and how to progress and feels ready for DC home today.   Follow Up Recommendations        Equipment Recommendations       Recommendations for Other Services       Precautions / Restrictions Precautions Precautions: Knee Required Braces or Orthoses: Knee Immobilizer - Left Knee Immobilizer - Left: On when out of bed or walking Restrictions Weight Bearing Restrictions: Yes LLE Weight Bearing: Weight bearing as tolerated    Mobility  Bed Mobility Overal bed mobility: Modified Independent                Transfers Overall transfer level: Modified independent   Transfers: Sit to/from Stand Sit to Stand: Modified independent (Device/Increase time)         General transfer comment: demonstrated safe technique  Ambulation/Gait Ambulation/Gait assistance: Modified independent (Device/Increase time)   Assistive device: Rolling walker (2 wheeled) Gait Pattern/deviations: Step-through pattern     General Gait Details: pt has been up walking in the room by himself without difficulty.     Stairs            Wheelchair Mobility    Modified Rankin (Stroke Patients Only)       Balance                                    Cognition Arousal/Alertness: Awake/alert Behavior During Therapy: WFL for tasks assessed/performed Overall Cognitive Status: Within Functional Limits for tasks assessed                      Exercises Total Joint Exercises Quad Sets: AROM;Left;Supine;10 reps Heel Slides: Left;10 reps;AAROM;Supine Hip ABduction/ADduction: AROM;Left;10 reps;Supine Straight Leg Raises: AAROM;Left;10 reps;Supine Long Arc Quad: Left;10  reps;Seated;AROM Knee Flexion: AROM;Left;Seated Goniometric ROM: 5-99 degrees Left knee ROM    General Comments General comments (skin integrity, edema, etc.): Pt more sore today in left knee muscles      Pertinent Vitals/Pain 7/10 left knee pain. Positioned comfortably at end of session.      Home Living                      Prior Function            PT Goals (current goals can now be found in the care plan section) Progress towards PT goals: Goals met/education completed, patient discharged from PT    Frequency       PT Plan Current plan remains appropriate    End of Session Equipment Utilized During Treatment: Left knee immobilizer Activity Tolerance: Patient tolerated treatment well Patient left: in chair;with call bell/phone within reach     Time: 2122-4825 PT Time Calculation (min): 24 min  Charges:  $Therapeutic Exercise: 23-37 mins                    G Codes:      Melvern Banker Apr 18, 2014, 10:51 AM Lavonia Dana, PT  [phone] 04/18/14

## 2014-03-22 NOTE — Discharge Summary (Signed)
Patient ID: Jeremy Douglas MRN: [id number] DOB/AGE: 57-Oct-1957 57 y.o.  Admit date: 03/19/2014 Discharge date: 03/21/2014 Admission Diagnoses:  Principal Problem:   Osteoarthritis of left knee Active Problems:   HYPERTENSION   Morbid obesity   Discharge Diagnoses:  Same  Past Medical History  Diagnosis Date  . Hypertension   . Sleep apnea     pt states he is on bipap at night for sleep apnea  . HTN (hypertension) 09/07/2012  . Alcohol dependence 09/07/2012  . Gastritis 09/07/2012    Per EGD 04/2011  . Esophagitis 09/07/2012    Per EGD 04/2011  . Thrombocytopenia 09/07/2012    Hx of in past.  . Wears glasses     Surgeries: Procedure(s): LEFT TOTAL KNEE ARTHROPLASTY on 03/19/2014   Discharged Condition: Improved  Hospital Course: Jeremy Douglas is an 58 y.o. male who was admitted 03/19/2014 for operative treatment ofOsteoarthritis of left knee. Patient has severe unremitting pain that affects sleep, daily activities, and work/hobbies. After pre-op clearance the patient was taken to the operating room on 03/19/2014 and underwent  Procedure(s): LEFT TOTAL KNEE ARTHROPLASTY.    Patient was given perioperative antibiotics: Anti-infectives   Start     Dose/Rate Route Frequency Ordered Stop   03/19/14 1600  ceFAZolin (ANCEF) IVPB 2 g/50 mL premix     2 g 100 mL/hr over 30 Minutes Intravenous Every 6 hours 03/19/14 1500 03/19/14 2205   03/19/14 0600  ceFAZolin (ANCEF) 3 g in dextrose 5 % 50 mL IVPB     3 g 160 mL/hr over 30 Minutes Intravenous On call to O.R. 03/18/14 1359 03/19/14 1023       Patient was given sequential compression devices, early ambulation, and chemoprophylaxis to prevent DVT.  Patient benefited maximally from hospital stay and there were no complications.    Recent vital signs: see chart   Recent laboratory studies:  Recent Labs  03/20/14 0427 03/21/14 0440  WBC 9.1 13.8*  HGB 8.6* 8.1*  HCT 26.5* 24.6*  PLT 119* 143*  NA 134*  --   K 3.8  --    CL 96  --   CO2 22  --   BUN 13  --   CREATININE 0.78  --   GLUCOSE 184*  --   CALCIUM 8.9  --      Discharge Medications:     Medication List    STOP taking these medications       ibuprofen 200 MG tablet  Commonly known as:  ADVIL,MOTRIN      TAKE these medications       amLODipine 2.5 MG tablet  Commonly known as:  NORVASC  Take 2.5 mg by mouth 2 (two) times daily.     aspirin EC 325 MG tablet  Take 1 tablet (325 mg total) by mouth 2 (two) times daily after a meal. Take x 1 month post op to decrease risk of blood clots.     cholecalciferol 1000 UNITS tablet  Commonly known as:  VITAMIN D  Take 1,000 Units by mouth daily.     cloNIDine 0.1 MG tablet  Commonly known as:  CATAPRES  Take 0.1 mg by mouth 2 (two) times daily.     Fish Oil 1000 MG Caps  Take 1 capsule by mouth daily.     furosemide 40 MG tablet  Commonly known as:  LASIX  Take 40 mg by mouth daily.     HYDROcodone-acetaminophen 10-325 MG per tablet  Commonly known as:  NORCO  Take 1 tablet by mouth 2 (two) times daily.     labetalol 200 MG tablet  Commonly known as:  NORMODYNE  Take 200 mg by mouth 2 (two) times daily.     MAGNESIUM-POTASSIUM PO  Take 1 tablet by mouth daily.     methocarbamol 750 MG tablet  Commonly known as:  ROBAXIN-750  Take 1 tablet (750 mg total) by mouth every 8 (eight) hours as needed for muscle spasms.     multivitamin with minerals Tabs tablet  Take 1 tablet by mouth daily.     oxyCODONE-acetaminophen 5-325 MG per tablet  Commonly known as:  PERCOCET/ROXICET  Take 1-2 tablets by mouth every 6 (six) hours as needed for severe pain.     sildenafil 50 MG tablet  Commonly known as:  VIAGRA  Take 50 mg by mouth daily as needed for erectile dysfunction.     SUPER B COMPLEX PO  Take 1 tablet by mouth daily.        Diagnostic Studies: Dg Chest 2 View  03/16/2014   CLINICAL DATA:  PRE OP  EXAM: CHEST  2 VIEW  COMPARISON:  DG CHEST 1V PORT dated 09/08/2012   FINDINGS: The heart size and mediastinal contours are within normal limits. Both lungs are clear. The visualized skeletal structures are unremarkable.  IMPRESSION: No active cardiopulmonary disease.   Electronically Signed   By: Salome HolmesHector  Cooper M.D.   On: 03/16/2014 14:56    Disposition: 01-Home or Self Care      Discharge Orders   Future Appointments Provider Department Dept Phone   01/06/2015 3:15 PM Waymon Budgelinton D Young, MD [facility] [phone]   Future Orders Complete By Expires   Change dressing  As directed    Weight bearing as tolerated  As directed    Questions:     Laterality:  left   Extremity:  Lower      Follow-up Information   Follow up with GRAVES,JOHN L, MD. Schedule an appointment as soon as possible for a visit in 2 weeks.   Specialty:  Orthopedic Surgery   Contact information:   [address] [place] [address] [phone]        Signed: Matthew FolksBETHUNE,Janelly Switalski G 03/22/2014, 10:17 AM

## 2014-03-23 ENCOUNTER — Encounter (HOSPITAL_COMMUNITY): Payer: Self-pay | Admitting: Orthopedic Surgery

## 2014-04-01 ENCOUNTER — Other Ambulatory Visit (HOSPITAL_COMMUNITY): Payer: Self-pay | Admitting: Orthopedic Surgery

## 2014-04-01 ENCOUNTER — Ambulatory Visit (HOSPITAL_COMMUNITY)
Admission: RE | Admit: 2014-04-01 | Discharge: 2014-04-01 | Disposition: A | Discharge status: Home / Self Care | Payer: BC Managed Care – PPO | Source: Ambulatory Visit | Attending: Orthopedic Surgery | Admitting: Orthopedic Surgery

## 2014-04-01 DIAGNOSIS — M7989 Other specified soft tissue disorders: Secondary | ICD-10-CM

## 2014-04-01 DIAGNOSIS — M25569 Pain in unspecified knee: Secondary | ICD-10-CM

## 2014-04-01 NOTE — Progress Notes (Signed)
*  PRELIMINARY RESULTS* Vascular Ultrasound Left lower extremity venous duplex has been completed.  Preliminary findings: no obvious evidence of DVT or baker's cyst.   Called report to [place], [place].   [place]endale ChardJill I Eunice RVT 04/01/2014, 4:40 PM

## 2014-04-20 ENCOUNTER — Ambulatory Visit
Admission: RE | Admit: 2014-04-20 | Discharge: 2014-04-20 | Disposition: A | Discharge status: Home / Self Care | Payer: BC Managed Care – PPO | Source: Ambulatory Visit | Attending: Family Medicine | Admitting: Family Medicine

## 2014-04-20 ENCOUNTER — Other Ambulatory Visit: Payer: Self-pay | Admitting: Family Medicine

## 2014-04-20 ENCOUNTER — Encounter (INDEPENDENT_AMBULATORY_CARE_PROVIDER_SITE_OTHER): Payer: Self-pay

## 2014-04-20 DIAGNOSIS — R05 Cough: Secondary | ICD-10-CM

## 2014-04-20 DIAGNOSIS — R059 Cough, unspecified: Secondary | ICD-10-CM

## 2014-08-10 ENCOUNTER — Other Ambulatory Visit: Payer: Self-pay | Admitting: Orthopedic Surgery

## 2014-08-18 ENCOUNTER — Encounter (HOSPITAL_COMMUNITY): Payer: Self-pay | Admitting: Pharmacy Technician

## 2014-08-18 NOTE — Pre-Procedure Instructions (Signed)
Jeremy Douglas  08/18/2014   Your procedure is scheduled on:  Fri, Sept 4 @ 7:30 AM  Report to [facility]  at 5:30 AM.  Call this number if you have problems the morning of surgery: [phone]   Remember:   Do not eat food or drink liquids after midnight.   Take these medicines the morning of surgery with A SIP OF WATER: Amlodipine(Norvasc),Clonidine(Catapres),Pain Pill(if needed),and Labetalol(Normodyne)               Stop taking your Aspirin and Fish Oil. No Goody's,BC's,Aleve,Ibuprofen,or any Herbal Medications   Do not wear jewelry  Do not wear lotions, powders, or colognes. You may wear deodorant.  Men may shave face and neck.  Do not bring valuables to the hospital.  [facility] is not responsible                  for any belongings or valuables.               Contacts, dentures or bridgework may not be worn into surgery.  Leave suitcase in the car. After surgery it may be brought to your room.  For patients admitted to the hospital, discharge time is determined by your                treatment team.                 Special Instructions:  [place] - Preparing for Surgery  Before surgery, you can play an important role.  Because skin is not sterile, your skin needs to be as free of germs as possible.  You can reduce the number of germs on you skin by washing with CHG (chlorahexidine gluconate) soap before surgery.  CHG is an antiseptic cleaner which kills germs and bonds with the skin to continue killing germs even after washing.  Please DO NOT use if you have an allergy to CHG or antibacterial soaps.  If your skin becomes reddened/irritated stop using the CHG and inform your nurse when you arrive at Short Stay.  Do not shave (including legs and underarms) for at least 48 hours prior to the first CHG shower.  You may shave your face.  Please follow these instructions carefully:   1.  Shower with CHG Soap the night before surgery and the                                 morning of Surgery.  2.  If you choose to wash your hair, wash your hair first as usual with your       normal shampoo.  3.  After you shampoo, rinse your hair and body thoroughly to remove the                      Shampoo.  4.  Use CHG as you would any other liquid soap.  You can apply chg directly       to the skin and wash gently with scrungie or a clean washcloth.  5.  Apply the CHG Soap to your body ONLY FROM THE NECK DOWN.        Do not use on open wounds or open sores.  Avoid contact with your eyes,       ears, mouth and genitals (private parts).  Wash genitals (private parts)       with your normal  soap.  6.  Wash thoroughly, paying special attention to the area where your surgery        will be performed.  7.  Thoroughly rinse your body with warm water from the neck down.  8.  DO NOT shower/wash with your normal soap after using and rinsing off       the CHG Soap.  9.  Pat yourself dry with a clean towel.            10.  Wear clean pajamas.            11.  Place clean sheets on your bed the night of your first shower and do not        sleep with pets.  Day of Surgery  Do not apply any lotions/deoderants the morning of surgery.  Please wear clean clothes to the hospital/surgery center.     Please read over the following fact sheets that you were given: Pain Booklet, Coughing and Deep Breathing, Blood Transfusion Information, MRSA Information and Surgical Site Infection Prevention

## 2014-08-19 ENCOUNTER — Encounter (HOSPITAL_COMMUNITY): Payer: Self-pay

## 2014-08-19 ENCOUNTER — Inpatient Hospital Stay (HOSPITAL_COMMUNITY)
Admission: RE | Admit: 2014-08-19 | Discharge: 2014-08-19 | Disposition: A | Discharge status: Home / Self Care | Payer: BC Managed Care – PPO | Source: Ambulatory Visit

## 2014-08-19 NOTE — Progress Notes (Signed)
    Echo report in epic from 2013  EKG in epic from 03-16-14  CXR in epic from 04-20-14  Sleep study in epic from 2009

## 2014-08-24 ENCOUNTER — Inpatient Hospital Stay (HOSPITAL_COMMUNITY): Admission: RE | Admit: 2014-08-24 | Payer: BC Managed Care – PPO | Source: Ambulatory Visit

## 2014-08-27 ENCOUNTER — Encounter (HOSPITAL_COMMUNITY)
Admission: RE | Disposition: A | Discharge status: Home Health | Payer: Self-pay | Source: Ambulatory Visit | Attending: Orthopedic Surgery

## 2014-08-27 SURGERY — ARTHROPLASTY, KNEE, TOTAL
Anesthesia: General | Laterality: Right

## 2014-09-07 ENCOUNTER — Other Ambulatory Visit: Payer: Self-pay | Admitting: Orthopedic Surgery

## 2014-10-04 ENCOUNTER — Encounter (HOSPITAL_COMMUNITY): Payer: Self-pay | Admitting: Pharmacy Technician

## 2014-10-05 ENCOUNTER — Inpatient Hospital Stay (HOSPITAL_COMMUNITY): Admission: RE | Admit: 2014-10-05 | Payer: BC Managed Care – PPO | Source: Ambulatory Visit

## 2014-10-11 NOTE — Pre-Procedure Instructions (Signed)
Jeremy Douglas  10/11/2014   Your procedure is scheduled on:  Friday, Oct. 23rd   [facility]  5:30 AM.   Call this number if you have problems the morning of surgery: [phone]   Remember:   Do not eat food or drink liquids after midnight Thursday.    Take these medicines the morning of surgery with A SIP OF WATER: Amlodipine, Clonidine, Pain Pill, Labetalol.             Stop taking aspirin, fish oil, Goody powder, aleve, ibuprofen and herbal medications.    Do not wear jewelry no rings or watches.  Do not wear lotions or colognes.  You may NOT wear deodorant the day of surgery.   Men may shave face and neck.   Do not bring valuables to the hospital.  [facility] any belongings or valuables.               Contacts, dentures or bridgework may not be worn into surgery.  Leave suitcase in the car. After surgery it may be brought to your room.  For patients admitted to the hospital, discharge time is determined by your treatment team.    Name and phone number of your driver:    Special Instructions: "Preparing for Surgery" instruction sheet.   Please read over the following fact sheets that you were given: Pain Booklet, Coughing and Deep Breathing, Blood Transfusion Information, MRSA Information and Surgical Site Infection Prevention

## 2014-10-12 ENCOUNTER — Encounter (HOSPITAL_COMMUNITY)
Admission: RE | Admit: 2014-10-12 | Discharge: 2014-10-12 | Disposition: A | Discharge status: Home / Self Care | Payer: BC Managed Care – PPO | Source: Ambulatory Visit | Attending: Orthopedic Surgery | Admitting: Orthopedic Surgery

## 2014-10-12 ENCOUNTER — Encounter (HOSPITAL_COMMUNITY): Payer: Self-pay

## 2014-10-12 HISTORY — DX: Headache: R51

## 2014-10-12 HISTORY — DX: Headache, unspecified: R51.9

## 2014-10-12 HISTORY — DX: Anemia, unspecified: D64.9

## 2014-10-12 LAB — CBC WITH DIFFERENTIAL/PLATELET
Basophils Absolute: 0.1 10*3/uL (ref 0.0–0.1)
Basophils Relative: 1 % (ref 0–1)
EOS PCT: 3 % (ref 0–5)
Eosinophils Absolute: 0.2 10*3/uL (ref 0.0–0.7)
HCT: 34.8 % — ABNORMAL LOW (ref 39.0–52.0)
Hemoglobin: 11.3 g/dL — ABNORMAL LOW (ref 13.0–17.0)
Lymphocytes Relative: 29 % (ref 12–46)
Lymphs Abs: 2.4 10*3/uL (ref 0.7–4.0)
MCH: 22 pg — ABNORMAL LOW (ref 26.0–34.0)
MCHC: 32.5 g/dL (ref 30.0–36.0)
MCV: 67.8 fL — AB (ref 78.0–100.0)
MONO ABS: 0.7 10*3/uL (ref 0.1–1.0)
MONOS PCT: 9 % (ref 3–12)
NEUTROS ABS: 4.8 10*3/uL (ref 1.7–7.7)
Neutrophils Relative %: 58 % (ref 43–77)
Platelets: 216 10*3/uL (ref 150–400)
RBC: 5.13 MIL/uL (ref 4.22–5.81)
RDW: 16.4 % — AB (ref 11.5–15.5)
WBC: 8.2 10*3/uL (ref 4.0–10.5)

## 2014-10-12 LAB — URINALYSIS, ROUTINE W REFLEX MICROSCOPIC
BILIRUBIN URINE: NEGATIVE
GLUCOSE, UA: NEGATIVE mg/dL
Hgb urine dipstick: NEGATIVE
Ketones, ur: NEGATIVE mg/dL
Leukocytes, UA: NEGATIVE
NITRITE: NEGATIVE
PH: 6 (ref 5.0–8.0)
Protein, ur: 30 mg/dL — AB
SPECIFIC GRAVITY, URINE: 1.02 (ref 1.005–1.030)
Urobilinogen, UA: 0.2 mg/dL (ref 0.0–1.0)

## 2014-10-12 LAB — COMPREHENSIVE METABOLIC PANEL
ALT: 27 U/L (ref 0–53)
ANION GAP: 14 (ref 5–15)
AST: 37 U/L (ref 0–37)
Albumin: 4 g/dL (ref 3.5–5.2)
Alkaline Phosphatase: 118 U/L — ABNORMAL HIGH (ref 39–117)
BILIRUBIN TOTAL: 0.7 mg/dL (ref 0.3–1.2)
BUN: 11 mg/dL (ref 6–23)
CALCIUM: 9.4 mg/dL (ref 8.4–10.5)
CHLORIDE: 103 meq/L (ref 96–112)
CO2: 23 mEq/L (ref 19–32)
Creatinine, Ser: 0.86 mg/dL (ref 0.50–1.35)
GFR calc non Af Amer: >90 mL/min (ref >90)
Glucose, Bld: 98 mg/dL (ref 70–99)
Potassium: 3.8 mEq/L (ref 3.7–5.3)
Sodium: 140 mEq/L (ref 137–147)
Total Protein: 8.3 g/dL (ref 6.0–8.3)

## 2014-10-12 LAB — TYPE AND SCREEN
ABO/RH(D): A POS
Antibody Screen: NEGATIVE

## 2014-10-12 LAB — PROTIME-INR
INR: 1.04 (ref 0.00–1.49)
Prothrombin Time: 13.7 seconds (ref 11.6–15.2)

## 2014-10-12 LAB — URINE MICROSCOPIC-ADD ON

## 2014-10-12 LAB — APTT: aPTT: 37 seconds (ref 24–37)

## 2014-10-12 LAB — SURGICAL PCR SCREEN
MRSA, PCR: NEGATIVE
Staphylococcus aureus: NEGATIVE

## 2014-10-12 NOTE — Progress Notes (Signed)
Patient had sleep study done around 2009.   Wears cpap, and he thinks setting "on very high"-- 18 he believes.  Will bring mask and machine (he insisted on the machine). Denies any cardiac issues, nor has he seen a cardio.  DA

## 2014-10-14 MED ORDER — DEXTROSE 5 % IV SOLN
3.0000 g | INTRAVENOUS | Status: AC
  Administered 2014-10-15: 3 g via INTRAVENOUS
  Filled 2014-10-14: qty 3000

## 2014-10-15 ENCOUNTER — Encounter (HOSPITAL_COMMUNITY)
Admission: RE | Disposition: A | Discharge status: Home Health | Payer: Self-pay | Source: Ambulatory Visit | Attending: Orthopedic Surgery

## 2014-10-15 ENCOUNTER — Encounter (HOSPITAL_COMMUNITY): Payer: BC Managed Care – PPO | Admitting: Certified Registered Nurse Anesthetist

## 2014-10-15 ENCOUNTER — Encounter (HOSPITAL_COMMUNITY): Payer: Self-pay | Admitting: *Deleted

## 2014-10-15 ENCOUNTER — Inpatient Hospital Stay (HOSPITAL_COMMUNITY)
Admission: RE | Admit: 2014-10-15 | Discharge: 2014-10-17 | DRG: 470 | Disposition: A | Discharge status: Home Health | Payer: BC Managed Care – PPO | Source: Ambulatory Visit | Attending: Orthopedic Surgery | Admitting: Orthopedic Surgery

## 2014-10-15 ENCOUNTER — Inpatient Hospital Stay (HOSPITAL_COMMUNITY): Payer: BC Managed Care – PPO | Admitting: Certified Registered Nurse Anesthetist

## 2014-10-15 DIAGNOSIS — M1711 Unilateral primary osteoarthritis, right knee: Principal | ICD-10-CM | POA: Diagnosis present

## 2014-10-15 DIAGNOSIS — I10 Essential (primary) hypertension: Secondary | ICD-10-CM | POA: Diagnosis present

## 2014-10-15 DIAGNOSIS — Z01812 Encounter for preprocedural laboratory examination: Secondary | ICD-10-CM

## 2014-10-15 DIAGNOSIS — Z01811 Encounter for preprocedural respiratory examination: Secondary | ICD-10-CM

## 2014-10-15 DIAGNOSIS — Z96652 Presence of left artificial knee joint: Secondary | ICD-10-CM | POA: Diagnosis present

## 2014-10-15 DIAGNOSIS — F329 Major depressive disorder, single episode, unspecified: Secondary | ICD-10-CM | POA: Diagnosis present

## 2014-10-15 DIAGNOSIS — Z87891 Personal history of nicotine dependence: Secondary | ICD-10-CM

## 2014-10-15 DIAGNOSIS — Z6841 Body Mass Index (BMI) 40.0 and over, adult: Secondary | ICD-10-CM

## 2014-10-15 DIAGNOSIS — G4733 Obstructive sleep apnea (adult) (pediatric): Secondary | ICD-10-CM | POA: Diagnosis present

## 2014-10-15 DIAGNOSIS — D62 Acute posthemorrhagic anemia: Secondary | ICD-10-CM | POA: Diagnosis not present

## 2014-10-15 DIAGNOSIS — F1020 Alcohol dependence, uncomplicated: Secondary | ICD-10-CM | POA: Diagnosis present

## 2014-10-15 HISTORY — PX: TOTAL KNEE ARTHROPLASTY: SHX125

## 2014-10-15 SURGERY — ARTHROPLASTY, KNEE, TOTAL
Anesthesia: Monitor Anesthesia Care | Laterality: Right

## 2014-10-15 MED ORDER — ROCURONIUM BROMIDE 50 MG/5ML IV SOLN
INTRAVENOUS | Status: AC
Start: 2014-10-15 — End: 2014-10-15
  Filled 2014-10-15: qty 1

## 2014-10-15 MED ORDER — HYDROMORPHONE HCL 1 MG/ML IJ SOLN
INTRAMUSCULAR | Status: AC
  Administered 2014-10-15: 11:00:00
  Filled 2014-10-15: qty 1

## 2014-10-15 MED ORDER — ACETAMINOPHEN 325 MG PO TABS: 650.0000 mg | ORAL_TABLET | Freq: Four times a day (QID) | ORAL | Status: DC | PRN

## 2014-10-15 MED ORDER — PROPOFOL INFUSION 10 MG/ML OPTIME
INTRAVENOUS | Status: DC | PRN
  Administered 2014-10-15: 50 ug/kg/min via INTRAVENOUS

## 2014-10-15 MED ORDER — POLYETHYLENE GLYCOL 3350 17 G PO PACK: 17.0000 g | PACK | Freq: Every day | ORAL | Status: DC | PRN

## 2014-10-15 MED ORDER — BUPIVACAINE HCL (PF) 0.25 % IJ SOLN
INTRAMUSCULAR | Status: AC
  Filled 2014-10-15: qty 30

## 2014-10-15 MED ORDER — HYDROMORPHONE HCL 1 MG/ML IJ SOLN
INTRAMUSCULAR | Status: AC
  Administered 2014-10-15: 12:00:00
  Filled 2014-10-15: qty 1

## 2014-10-15 MED ORDER — SODIUM CHLORIDE 0.9 % IV SOLN
INTRAVENOUS | Status: DC
  Administered 2014-10-15 – 2014-10-16 (×2): via INTRAVENOUS

## 2014-10-15 MED ORDER — ACETAMINOPHEN 10 MG/ML IV SOLN
1000.0000 mg | Freq: Once | INTRAVENOUS | Status: AC
  Administered 2014-10-15: 1000 mg via INTRAVENOUS

## 2014-10-15 MED ORDER — ROPIVACAINE HCL 5 MG/ML IJ SOLN
INTRAMUSCULAR | Status: DC | PRN
  Administered 2014-10-15: 20 mL via PERINEURAL

## 2014-10-15 MED ORDER — OXYCODONE HCL 5 MG PO TABS
ORAL_TABLET | ORAL | Status: AC
  Administered 2014-10-15: 11:00:00
  Filled 2014-10-15: qty 1

## 2014-10-15 MED ORDER — LIDOCAINE HCL (CARDIAC) 20 MG/ML IV SOLN
INTRAVENOUS | Status: AC
  Filled 2014-10-15: qty 5

## 2014-10-15 MED ORDER — PROPOFOL 10 MG/ML IV BOLUS
INTRAVENOUS | Status: AC
Start: 2014-10-15 — End: 2014-10-15
  Filled 2014-10-15: qty 20

## 2014-10-15 MED ORDER — OXYCODONE HCL 5 MG PO TABS
5.0000 mg | ORAL_TABLET | Freq: Once | ORAL | Status: AC | PRN
  Administered 2014-10-15: 5 mg via ORAL

## 2014-10-15 MED ORDER — SODIUM CHLORIDE 0.9 % IR SOLN
Status: DC | PRN
  Administered 2014-10-15: 1000 mL

## 2014-10-15 MED ORDER — CYCLOBENZAPRINE HCL 10 MG PO TABS
10.0000 mg | ORAL_TABLET | Freq: Three times a day (TID) | ORAL | Status: DC | PRN
  Administered 2014-10-16 – 2014-10-17 (×3): 10 mg via ORAL
  Filled 2014-10-15 (×3): qty 1

## 2014-10-15 MED ORDER — ONDANSETRON HCL 4 MG/2ML IJ SOLN
INTRAMUSCULAR | Status: AC
  Filled 2014-10-15: qty 2

## 2014-10-15 MED ORDER — ONDANSETRON HCL 4 MG/2ML IJ SOLN: 4.0000 mg | Freq: Four times a day (QID) | INTRAMUSCULAR | Status: DC | PRN

## 2014-10-15 MED ORDER — FENTANYL CITRATE 0.05 MG/ML IJ SOLN
INTRAMUSCULAR | Status: AC
  Filled 2014-10-15: qty 5

## 2014-10-15 MED ORDER — OXYCODONE-ACETAMINOPHEN 5-325 MG PO TABS: 1.0000 | ORAL_TABLET | ORAL | Status: DC | PRN

## 2014-10-15 MED ORDER — 0.9 % SODIUM CHLORIDE (POUR BTL) OPTIME
TOPICAL | Status: DC | PRN
  Administered 2014-10-15: 1000 mL

## 2014-10-15 MED ORDER — CHLORHEXIDINE GLUCONATE 4 % EX LIQD
60.0000 mL | Freq: Once | CUTANEOUS | Status: DC
  Filled 2014-10-15: qty 60

## 2014-10-15 MED ORDER — OXYCODONE HCL 5 MG/5ML PO SOLN: 5.0000 mg | Freq: Once | ORAL | Status: AC | PRN

## 2014-10-15 MED ORDER — PROPOFOL 10 MG/ML IV BOLUS
INTRAVENOUS | Status: AC
  Filled 2014-10-15: qty 20

## 2014-10-15 MED ORDER — KETOROLAC TROMETHAMINE 15 MG/ML IJ SOLN
INTRAMUSCULAR | Status: AC
  Administered 2014-10-15: 12:00:00
  Filled 2014-10-15: qty 1

## 2014-10-15 MED ORDER — ONDANSETRON HCL 4 MG/2ML IJ SOLN
INTRAMUSCULAR | Status: DC | PRN
  Administered 2014-10-15: 4 mg via INTRAVENOUS

## 2014-10-15 MED ORDER — AMLODIPINE BESYLATE 2.5 MG PO TABS
2.5000 mg | ORAL_TABLET | Freq: Two times a day (BID) | ORAL | Status: DC
  Administered 2014-10-15 – 2014-10-17 (×4): 2.5 mg via ORAL
  Filled 2014-10-15 (×5): qty 1

## 2014-10-15 MED ORDER — OXYCODONE HCL 5 MG PO TABS
5.0000 mg | ORAL_TABLET | ORAL | Status: DC | PRN
  Administered 2014-10-15: 5 mg via ORAL

## 2014-10-15 MED ORDER — ASPIRIN EC 325 MG PO TBEC: 325.0000 mg | DELAYED_RELEASE_TABLET | Freq: Two times a day (BID) | ORAL | Status: DC

## 2014-10-15 MED ORDER — OXYCODONE HCL ER 20 MG PO T12A
20.0000 mg | EXTENDED_RELEASE_TABLET | Freq: Two times a day (BID) | ORAL | Status: DC
Start: 2014-10-15 — End: 2014-10-17
  Administered 2014-10-15 – 2014-10-17 (×4): 20 mg via ORAL
  Filled 2014-10-15 (×4): qty 1

## 2014-10-15 MED ORDER — BUPIVACAINE HCL (PF) 0.25 % IJ SOLN
INTRAMUSCULAR | Status: DC | PRN
  Administered 2014-10-15: 30 mL

## 2014-10-15 MED ORDER — OXYCODONE HCL 5 MG PO TABS
ORAL_TABLET | ORAL | Status: AC
  Administered 2014-10-15: 12:00:00
  Filled 2014-10-15: qty 1

## 2014-10-15 MED ORDER — ACETAMINOPHEN 650 MG RE SUPP: 650.0000 mg | Freq: Four times a day (QID) | RECTAL | Status: DC | PRN

## 2014-10-15 MED ORDER — DOCUSATE SODIUM 100 MG PO CAPS
100.0000 mg | ORAL_CAPSULE | Freq: Two times a day (BID) | ORAL | Status: DC
  Administered 2014-10-15 – 2014-10-17 (×4): 100 mg via ORAL
  Filled 2014-10-15 (×5): qty 1

## 2014-10-15 MED ORDER — OXYCODONE-ACETAMINOPHEN 5-325 MG PO TABS
1.0000 | ORAL_TABLET | ORAL | Status: DC | PRN
  Administered 2014-10-15 – 2014-10-17 (×10): 2 via ORAL
  Filled 2014-10-15 (×10): qty 2

## 2014-10-15 MED ORDER — BUPIVACAINE IN DEXTROSE 0.75-8.25 % IT SOLN
INTRATHECAL | Status: DC | PRN
  Administered 2014-10-15: 2 mL via INTRATHECAL

## 2014-10-15 MED ORDER — CLONIDINE HCL 0.1 MG PO TABS
0.1000 mg | ORAL_TABLET | Freq: Two times a day (BID) | ORAL | Status: DC
  Administered 2014-10-15 – 2014-10-17 (×4): 0.1 mg via ORAL
  Filled 2014-10-15 (×6): qty 1

## 2014-10-15 MED ORDER — BISACODYL 5 MG PO TBEC: 5.0000 mg | DELAYED_RELEASE_TABLET | Freq: Every day | ORAL | Status: DC | PRN

## 2014-10-15 MED ORDER — FENTANYL CITRATE 0.05 MG/ML IJ SOLN
INTRAMUSCULAR | Status: DC | PRN
  Administered 2014-10-15: 50 ug via INTRAVENOUS
  Administered 2014-10-15: 25 ug via INTRAVENOUS
  Administered 2014-10-15: 50 ug via INTRAVENOUS
  Administered 2014-10-15 (×2): 25 ug via INTRAVENOUS
  Administered 2014-10-15: 50 ug via INTRAVENOUS
  Administered 2014-10-15: 25 ug via INTRAVENOUS

## 2014-10-15 MED ORDER — OXYCODONE HCL ER 20 MG PO T12A: 20.0000 mg | EXTENDED_RELEASE_TABLET | Freq: Two times a day (BID) | ORAL | Status: DC

## 2014-10-15 MED ORDER — HYDROMORPHONE HCL 1 MG/ML IJ SOLN
0.2500 mg | INTRAMUSCULAR | Status: DC | PRN
  Administered 2014-10-15 (×4): 0.5 mg via INTRAVENOUS

## 2014-10-15 MED ORDER — DIPHENHYDRAMINE HCL 12.5 MG/5ML PO ELIX: 12.5000 mg | ORAL_SOLUTION | ORAL | Status: DC | PRN

## 2014-10-15 MED ORDER — LACTATED RINGERS IV SOLN
INTRAVENOUS | Status: DC | PRN
  Administered 2014-10-15 (×2): via INTRAVENOUS

## 2014-10-15 MED ORDER — ONDANSETRON HCL 4 MG PO TABS: 4.0000 mg | ORAL_TABLET | Freq: Four times a day (QID) | ORAL | Status: DC | PRN

## 2014-10-15 MED ORDER — BUPIVACAINE LIPOSOME 1.3 % IJ SUSP
INTRAMUSCULAR | Status: DC | PRN
  Administered 2014-10-15: 20 mL

## 2014-10-15 MED ORDER — MIDAZOLAM HCL 2 MG/2ML IJ SOLN
INTRAMUSCULAR | Status: AC
  Filled 2014-10-15: qty 2

## 2014-10-15 MED ORDER — METHOCARBAMOL 1000 MG/10ML IJ SOLN
500.0000 mg | Freq: Four times a day (QID) | INTRAVENOUS | Status: DC | PRN
  Administered 2014-10-15: 500 mg via INTRAVENOUS
  Filled 2014-10-15: qty 5

## 2014-10-15 MED ORDER — FUROSEMIDE 40 MG PO TABS
40.0000 mg | ORAL_TABLET | Freq: Every day | ORAL | Status: DC
  Administered 2014-10-16 – 2014-10-17 (×2): 40 mg via ORAL
  Filled 2014-10-15 (×3): qty 1

## 2014-10-15 MED ORDER — METHOCARBAMOL 500 MG PO TABS
500.0000 mg | ORAL_TABLET | Freq: Four times a day (QID) | ORAL | Status: DC | PRN
Start: 2014-10-15 — End: 2014-10-17
  Administered 2014-10-15 – 2014-10-17 (×6): 500 mg via ORAL
  Filled 2014-10-15 (×8): qty 1

## 2014-10-15 MED ORDER — ZOLPIDEM TARTRATE 5 MG PO TABS
10.0000 mg | ORAL_TABLET | Freq: Every evening | ORAL | Status: DC | PRN
  Administered 2014-10-16 – 2014-10-17 (×2): 10 mg via ORAL
  Filled 2014-10-15 (×2): qty 2

## 2014-10-15 MED ORDER — HYDROMORPHONE HCL 1 MG/ML IJ SOLN
0.2500 mg | INTRAMUSCULAR | Status: DC | PRN
  Administered 2014-10-15 (×2): 0.5 mg via INTRAVENOUS

## 2014-10-15 MED ORDER — CEFAZOLIN SODIUM-DEXTROSE 2-3 GM-% IV SOLR
2.0000 g | Freq: Four times a day (QID) | INTRAVENOUS | Status: AC
  Administered 2014-10-15 (×2): 2 g via INTRAVENOUS
  Filled 2014-10-15 (×3): qty 50

## 2014-10-15 MED ORDER — HYDROMORPHONE HCL 1 MG/ML IJ SOLN
1.0000 mg | INTRAMUSCULAR | Status: DC | PRN
  Administered 2014-10-15 – 2014-10-16 (×9): 2 mg via INTRAVENOUS
  Filled 2014-10-15 (×9): qty 2

## 2014-10-15 MED ORDER — MIDAZOLAM HCL 5 MG/5ML IJ SOLN
INTRAMUSCULAR | Status: DC | PRN
  Administered 2014-10-15 (×2): 1 mg via INTRAVENOUS

## 2014-10-15 MED ORDER — ASPIRIN EC 325 MG PO TBEC
325.0000 mg | DELAYED_RELEASE_TABLET | Freq: Two times a day (BID) | ORAL | Status: DC
  Administered 2014-10-15 – 2014-10-17 (×4): 325 mg via ORAL
  Filled 2014-10-15 (×6): qty 1

## 2014-10-15 MED ORDER — TRANEXAMIC ACID 100 MG/ML IV SOLN
1000.0000 mg | INTRAVENOUS | Status: AC
  Administered 2014-10-15: 1000 mg via INTRAVENOUS
  Filled 2014-10-15: qty 10

## 2014-10-15 MED ORDER — KETOROLAC TROMETHAMINE 15 MG/ML IJ SOLN
15.0000 mg | Freq: Four times a day (QID) | INTRAMUSCULAR | Status: AC
  Administered 2014-10-15 – 2014-10-16 (×5): 15 mg via INTRAVENOUS
  Filled 2014-10-15 (×2): qty 1

## 2014-10-15 MED ORDER — ALUM & MAG HYDROXIDE-SIMETH 200-200-20 MG/5ML PO SUSP
30.0000 mL | ORAL | Status: DC | PRN
Start: 2014-10-15 — End: 2014-10-17

## 2014-10-15 MED ORDER — ACETAMINOPHEN 10 MG/ML IV SOLN
INTRAVENOUS | Status: AC
  Administered 2014-10-15: 12:00:00
  Filled 2014-10-15: qty 100

## 2014-10-15 MED ORDER — PROMETHAZINE HCL 25 MG/ML IJ SOLN: 12.5000 mg | Freq: Four times a day (QID) | INTRAMUSCULAR | Status: DC | PRN

## 2014-10-15 MED ORDER — LABETALOL HCL 200 MG PO TABS
200.0000 mg | ORAL_TABLET | Freq: Two times a day (BID) | ORAL | Status: DC
  Administered 2014-10-15 – 2014-10-17 (×4): 200 mg via ORAL
  Filled 2014-10-15 (×6): qty 1

## 2014-10-15 MED ORDER — BUPIVACAINE LIPOSOME 1.3 % IJ SUSP
20.0000 mL | INTRAMUSCULAR | Status: DC
  Filled 2014-10-15: qty 20

## 2014-10-15 SURGICAL SUPPLY — 72 items
APL SKNCLS STERI-STRIP NONHPOA (GAUZE/BANDAGES/DRESSINGS) ×1
BANDAGE ESMARK 6X9 LF (GAUZE/BANDAGES/DRESSINGS) ×1 IMPLANT
BENZOIN TINCTURE PRP APPL 2/3 (GAUZE/BANDAGES/DRESSINGS) ×2 IMPLANT
BLADE SAGITTAL 25.0X1.19X90 (BLADE) ×2 IMPLANT
BLADE SAW SAG 90X13X1.27 (BLADE) ×2 IMPLANT
BNDG CMPR 9X6 STRL LF SNTH (GAUZE/BANDAGES/DRESSINGS) ×1
BNDG ESMARK 6X9 LF (GAUZE/BANDAGES/DRESSINGS) ×2
BOWL SMART MIX CTS (DISPOSABLE) ×2 IMPLANT
CAPT RP KNEE ×1 IMPLANT
CEMENT HV SMART SET (Cement) ×4 IMPLANT
COVER SURGICAL LIGHT HANDLE (MISCELLANEOUS) ×2 IMPLANT
CUFF TOURNIQUET SINGLE 34IN LL (TOURNIQUET CUFF) ×2 IMPLANT
CUFF TOURNIQUET SINGLE 44IN (TOURNIQUET CUFF) IMPLANT
DRAPE EXTREMITY T 121X128X90 (DRAPE) ×2 IMPLANT
DRAPE U-SHAPE 47X51 STRL (DRAPES) ×2 IMPLANT
DRSG MEPILEX BORDER 4X12 (GAUZE/BANDAGES/DRESSINGS) ×2 IMPLANT
DRSG PAD ABDOMINAL 8X10 ST (GAUZE/BANDAGES/DRESSINGS) ×1 IMPLANT
DURAPREP 26ML APPLICATOR (WOUND CARE) ×2 IMPLANT
ELECT REM PT RETURN 9FT ADLT (ELECTROSURGICAL) ×2
ELECTRODE REM PT RTRN 9FT ADLT (ELECTROSURGICAL) ×1 IMPLANT
EVACUATOR 1/8 PVC DRAIN (DRAIN) ×2 IMPLANT
FACESHIELD WRAPAROUND (MASK) ×2 IMPLANT
FACESHIELD WRAPAROUND OR TEAM (MASK) ×1 IMPLANT
GAUZE SPONGE 4X4 12PLY STRL (GAUZE/BANDAGES/DRESSINGS) ×2 IMPLANT
GAUZE XEROFORM 5X9 LF (GAUZE/BANDAGES/DRESSINGS) ×1 IMPLANT
GLOVE BIOGEL PI IND STRL 6.5 (GLOVE) IMPLANT
GLOVE BIOGEL PI IND STRL 7.0 (GLOVE) IMPLANT
GLOVE BIOGEL PI IND STRL 8 (GLOVE) ×2 IMPLANT
GLOVE BIOGEL PI INDICATOR 6.5 (GLOVE) ×1
GLOVE BIOGEL PI INDICATOR 7.0 (GLOVE) ×1
GLOVE BIOGEL PI INDICATOR 8 (GLOVE) ×3
GLOVE ECLIPSE 7.5 STRL STRAW (GLOVE) ×4 IMPLANT
GLOVE SURG SS PI 6.0 STRL IVOR (GLOVE) ×2 IMPLANT
GOWN STRL REUS W/ TWL LRG LVL3 (GOWN DISPOSABLE) ×1 IMPLANT
GOWN STRL REUS W/ TWL XL LVL3 (GOWN DISPOSABLE) ×2 IMPLANT
GOWN STRL REUS W/TWL LRG LVL3 (GOWN DISPOSABLE) ×4
GOWN STRL REUS W/TWL XL LVL3 (GOWN DISPOSABLE) ×4
HANDPIECE INTERPULSE COAX TIP (DISPOSABLE) ×2
HOOD PEEL AWAY FACE SHEILD DIS (HOOD) ×6 IMPLANT
IMMOBILIZER KNEE 20 (SOFTGOODS) IMPLANT
IMMOBILIZER KNEE 22 UNIV (SOFTGOODS) ×2 IMPLANT
KIT BASIN OR (CUSTOM PROCEDURE TRAY) ×2 IMPLANT
KIT ROOM TURNOVER OR (KITS) ×2 IMPLANT
MANIFOLD NEPTUNE II (INSTRUMENTS) ×2 IMPLANT
NDL HYPO 25GX1X1/2 BEV (NEEDLE) IMPLANT
NEEDLE 22X1 1/2 (OR ONLY) (NEEDLE) ×1 IMPLANT
NEEDLE HYPO 25GX1X1/2 BEV (NEEDLE) IMPLANT
NS IRRIG 1000ML POUR BTL (IV SOLUTION) ×2 IMPLANT
PACK TOTAL JOINT (CUSTOM PROCEDURE TRAY) ×2 IMPLANT
PAD ARMBOARD 7.5X6 YLW CONV (MISCELLANEOUS) ×4 IMPLANT
PAD CAST 4YDX4 CTTN HI CHSV (CAST SUPPLIES) ×1 IMPLANT
PADDING CAST COTTON 4X4 STRL (CAST SUPPLIES) ×2
SET HNDPC FAN SPRY TIP SCT (DISPOSABLE) ×1 IMPLANT
STAPLER VISISTAT 35W (STAPLE) IMPLANT
STRIP CLOSURE SKIN 1/2X4 (GAUZE/BANDAGES/DRESSINGS) ×2 IMPLANT
STRIP CLOSURE SKIN 1/4X4 (GAUZE/BANDAGES/DRESSINGS) ×2 IMPLANT
SUCTION FRAZIER TIP 10 FR DISP (SUCTIONS) IMPLANT
SUT MNCRL AB 3-0 PS2 18 (SUTURE) ×2 IMPLANT
SUT VIC AB 0 CT1 27 (SUTURE) ×2
SUT VIC AB 0 CT1 27XBRD ANBCTR (SUTURE) IMPLANT
SUT VIC AB 0 CTB1 27 (SUTURE) IMPLANT
SUT VIC AB 1 CT1 27 (SUTURE) ×8
SUT VIC AB 1 CT1 27XBRD ANBCTR (SUTURE) ×2 IMPLANT
SUT VIC AB 2-0 CTB1 (SUTURE) ×2 IMPLANT
SYR 50ML LL SCALE MARK (SYRINGE) ×2 IMPLANT
SYR CONTROL 10ML LL (SYRINGE) IMPLANT
TOWEL OR 17X24 6PK STRL BLUE (TOWEL DISPOSABLE) ×2 IMPLANT
TOWEL OR 17X26 10 PK STRL BLUE (TOWEL DISPOSABLE) ×2 IMPLANT
TRAY FOLEY CATH 16FRSI W/METER (SET/KITS/TRAYS/PACK) ×1 IMPLANT
TUBE CONNECTING 12X1/4 (SUCTIONS) ×1 IMPLANT
WATER STERILE IRR 1000ML POUR (IV SOLUTION) ×4 IMPLANT
YANKAUER SUCT BULB TIP NO VENT (SUCTIONS) ×1 IMPLANT

## 2014-10-15 NOTE — Discharge Instructions (Signed)
Total Knee Replacement, Care After °Refer to this sheet in the next few weeks. These instructions provide you with information on caring for yourself after your procedure. Your health care provider also may give you specific instructions. Your treatment has been planned according to the most current medical practices, but problems sometimes occur. Call your health care provider if you have any problems or questions after your procedure. °HOME CARE INSTRUCTIONS  °· See a physical therapist as directed by your health care provider. °· Take medicines only as directed by your health care provider. °· Avoid lifting or driving until you are instructed otherwise. °· If you have been sent home with a continuous passive motion machine, use it as directed by your health care provider. °SEEK MEDICAL CARE IF: °· You have difficulty breathing. °· You have drainage, redness, swelling, or pain at your incision site. °· You have a bad smell coming from your incision site. °· You have persistent bleeding from your incision site. °· Your incision breaks open after sutures (stitches) or staples have been removed. °· You have a fever. °SEEK IMMEDIATE MEDICAL CARE IF:  °· You have a rash. °· You have pain or swelling in your calf or thigh. °· You have shortness of breath or chest pain. °· Your range of motion in your knee is decreasing rather than increasing. °MAKE SURE YOU:  °· Understand these instructions. °· Will watch your condition. °· Will get help right away if you are not doing well or get worse. °Document Released: 06/29/2005 Document Revised: 04/26/2014 Document Reviewed: 01/29/2012 °[employer]® Patient Information ©2015 [employer]. This information is not intended to replace advice given to you by your health care provider. Make sure you discuss any questions you have with your health care provider. ° °

## 2014-10-15 NOTE — Plan of Care (Signed)
Problem: Consults Goal: Diagnosis- Total Joint Replacement Outcome: Completed/Met Date Met:  10/15/14 Primary Total Knee Right

## 2014-10-15 NOTE — Anesthesia Procedure Notes (Addendum)
Anesthesia Regional Block:  Femoral nerve block  Pre-Anesthetic Checklist: ,, timeout performed, Correct Patient, Correct Site, Correct Laterality, Correct Procedure, Correct Position, site marked, Risks and benefits discussed,  Surgical consent,  Pre-op evaluation,  At surgeon's request and post-op pain management  Laterality: Lower and Right  Prep: chloraprep       Needles:  Injection technique: Single-shot  Needle Type: Echogenic Stimulator Needle          Additional Needles:  Procedures: ultrasound guided (picture in chart) and nerve stimulator Femoral nerve block  Nerve Stimulator or Paresthesia:  Response: quad, 0.5 mA,   Additional Responses:   Narrative:  Start time: 10/15/2014 7:02 AM End time: 10/15/2014 7:08 AM Injection made incrementally with aspirations every 5 mL.  Performed by: Personally  Anesthesiologist: Keyanni Whittinghill  Additional Notes: H+P and labs reviewed, risks and benefits discussed with patient, procedure tolerated well without complications   Spinal  Patient location during procedure: OR Start time: 10/15/2014 7:31 AM End time: 10/15/2014 7:38 AM Staffing Anesthesiologist: Mariaguadalupe Fialkowski, CHRIS Preanesthetic Checklist Completed: patient identified, surgical consent, pre-op evaluation, timeout performed, IV checked, risks and benefits discussed and monitors and equipment checked Spinal Block Patient position: sitting Prep: site prepped and draped and DuraPrep Patient monitoring: heart rate, cardiac monitor, continuous pulse ox and blood pressure Approach: midline Location: L4-5 Injection technique: single-shot Needle Needle type: Pencan  Needle gauge: 24 G Needle length: 10 cm Assessment Sensory level: T6

## 2014-10-15 NOTE — Progress Notes (Signed)
Orthopedic Tech Progress Note Patient Details:  Jeremy Douglas [birth year] [id number]  CPM Right Knee CPM Right Knee: On Right Knee Flexion (Degrees): 90 Right Knee Extension (Degrees): 0 Additional Comments: trapeze bar patient helper Footsie roll  [name] 10/15/2014, 10:07 AM

## 2014-10-15 NOTE — Op Note (Signed)
NAMTimoteo Douglas:  Beverlin, Trysten              ACCOUNT NO.:  [id number]  MEDICAL RECORD NO.:  [id number]  LOCATION:  [facility]                         FACILITY:  [facility]  PHYSICIAN:  [name]. Nora Rooke, M.D.   DATE OF BIRTH:  03/26/1956  DATE OF PROCEDURE:  10/15/2014 DATE OF DISCHARGE:                              OPERATIVE REPORT   PREOPERATIVE DIAGNOSIS:  End-stage degenerative joint disease, right knee.  POSTOPERATIVE DIAGNOSIS:  End-stage degenerative joint disease, right knee.  PROCEDURE:  Right total knee replacement with Sigma system, size 4 femur, size 4 tibia, 10-mm bridging bearing, and a 38-mm all- polyethylene patella.  SURGEON:  [name]. Stepanie Graver, M.D.  ASSISTANT:  Marshia LyJames Bethune, P.A.  ANESTHESIA:  General.  BRIEF HISTORY:  Mr. Orvan FalconerCampbell is a 58 year old male with a history of having severe bilateral degenerative joint disease of both knees.  He had failed conservative care and had a left total knee replacement which he did well.  He is having night pain, lot of activity pain, and had failed all conservative care on the right side.  He was taken to the operating room for a right total knee replacement.  DESCRIPTION OF PROCEDURE:  The patient was taken to the operating room and after adequate anesthesia was obtained with general anesthetic, the patient was placed supine on the operating table.  The right leg was prepped and draped in usual sterile fashion.  Following this, the leg was exsanguinated.  Blood pressure tourniquet inflated to 350 mmHg. Following this, a midline incision was made in the subcutaneous tissue, dissected down to the level of the extensor mechanism, and a medial parapatellar arthrotomy was undertaken.  Following this, the medial and lateral meniscus were removed.  Retropatellar fat pad, synovium in the anterior aspect of the femur and anterior and posterior cruciates, tibia was exposed and cut perpendicular to its long axis with an extramedullary tibial guide.   Following this, an intramedullary guide was used and the femur was cut perpendicular to the guide at a 5-degree valgus inclination for the right side.  Following this, spacer block was put in place, came easily into full extension.  Attention turned to the femur, sized to a 4, anterior and posterior cuts were made, chamfers and box,  Attention turned to the tibia, sized to a 4 drilled and keeled. Trials were put in place.  The tibia was cut down to a level of 14 mm and a 38 mm paddle was chosen.  Lugs were drilled and the trial patella was put in place at this point.  Knee put through a range of motion, excellent stability, range of motion were achieved.  Good gap, balance in flexion and extension.  Following this, all trial components were removed.  The knee was copiously and thoroughly lavaged, suctioned dry. Final components were then cemented into place, size 4 femur, size 4 tibia, 10 mm bridging bearing trial was placed and a 38-mm all- polyethylene patella was placed and held with a clamp.  Following this, all excess bone cement was removed and the cement was allowed to harden. 20 mL of Exparel with 20 mL of 1% Marcaine were instilled throughout the knee for postoperative pain control.  At this  point, the cement was allowed to harden.  The tourniquet was let down.  All bleeding was controlled with electrocautery.  Medium Hemovac drain was placed and the medial parapatellar arthrotomy was closed with 1 Vicryl running.  Skin was closed with 0 and 2-0 Vicryl and 3-0 Monocryl subcuticular.  Benzoin and Steri-Strips were applied. Sterile compressive dressing was applied.  The patient was taken to the recovery room and was noted to be in satisfactory condition.  Estimated blood loss for the procedure was minimal.     [name]. [name], M.D.     Ranae PlumberJLG/MEDQ  D:  10/15/2014  T:  10/15/2014  Job:  [id number]

## 2014-10-15 NOTE — Anesthesia Preprocedure Evaluation (Addendum)
Anesthesia Evaluation  Patient identified by MRN, date of birth, ID band Patient awake    Reviewed: Allergy & Precautions, H&P , NPO status , Patient's Chart, lab work & pertinent test results  Airway Mallampati: II TM Distance: >3 FB Neck ROM: Full    Dental  (+) Teeth Intact   Pulmonary sleep apnea and Continuous Positive Airway Pressure Ventilation , neg COPDneg recent URI, former smoker,          Cardiovascular hypertension, Pt. on medications and Pt. on home beta blockers - angina- Past MI and - CHF - dysrhythmias Rhythm:Regular Rate:Normal     Neuro/Psych  Headaches, PSYCHIATRIC DISORDERS Depression    GI/Hepatic negative GI ROS, Neg liver ROS,   Endo/Other  neg diabetesMorbid obesity  Renal/GU      Musculoskeletal  (+) Arthritis -, Osteoarthritis,    Abdominal (+) + obese,   Peds  Hematology  (+) anemia ,   Anesthesia Other Findings   Reproductive/Obstetrics                          Anesthesia Physical Anesthesia Plan  ASA: II  Anesthesia Plan: MAC, Spinal and Regional   Post-op Pain Management:    Induction: Intravenous  Airway Management Planned:   Additional Equipment: None  Intra-op Plan:   Post-operative Plan:   Informed Consent:   Dental advisory given  Plan Discussed with: Surgeon and CRNA  Anesthesia Plan Comments:        Anesthesia Quick Evaluation

## 2014-10-15 NOTE — Evaluation (Signed)
Physical Therapy Evaluation Patient Details Name: Jeremy PlowmanRoger C Lombardozzi MRN: [id number] DOB: 10/04/1956 Today's Date: 10/15/2014   History of Present Illness  Pt is a 58 y.o. male s/p Rt TKA; previous Lt TKA in March   Clinical Impression  Pt is s/p Rt TKA POD#0 resulting in the deficits listed below (see PT Problem List).  Pt will benefit from skilled PT to increase their independence and safety with mobility to allow discharge to the venue listed below. Anticipate good progress with therapy. Pt very motivated and did very well after previous TKA in March. Limited to SPT to chair due to buckling of Rt LE with WB'ing.      Follow Up Recommendations Home health PT;Supervision/Assistance - 24 hour    Equipment Recommendations  None recommended by PT    Recommendations for Other Services OT consult     Precautions / Restrictions Precautions Precautions: Knee Precaution Comments: reviewed knee precautions  Required Braces or Orthoses: Knee Immobilizer - Right Knee Immobilizer - Right: On when out of bed or walking Restrictions Weight Bearing Restrictions: Yes RLE Weight Bearing: Weight bearing as tolerated      Mobility  Bed Mobility Overal bed mobility: Needs Assistance Bed Mobility: Supine to Sit     Supine to sit: HOB elevated;Min guard     General bed mobility comments: pt relying heavily on handrail for bed mobility; cues for sequencing and min guard to control Rt LE off EOB   Transfers Overall transfer level: Needs assistance Equipment used: Rolling walker (2 wheeled) Transfers: Sit to/from UGI CorporationStand;Stand Pivot Transfers Sit to Stand: Min guard Stand pivot transfers: Min assist       General transfer comment: cues for hand placement and technique   Ambulation/Gait             General Gait Details: SPT only this session secondary to buckling of Rt LE with WB   Stairs            Wheelchair Mobility    Modified Rankin (Stroke Patients Only)        Balance Overall balance assessment: Needs assistance Sitting-balance support: Feet supported;No upper extremity supported Sitting balance-Leahy Scale: Fair Sitting balance - Comments: denied any dizziness sitting EOB   Standing balance support: During functional activity;Bilateral upper extremity supported Standing balance-Leahy Scale: Poor Standing balance comment: relies on RW for balance                              Pertinent Vitals/Pain Pain Assessment: 0-10 Pain Score: 8  Pain Location: Rt knee Pain Descriptors / Indicators:  (stretching ) Pain Intervention(s): Monitored during session;Premedicated before session;Repositioned    Home Living Family/patient expects to be discharged to:: Private residence Living Arrangements: Children Available Help at Discharge: Family;Available 24 hours/day Type of Home: House Home Access: Level entry     Home Layout: Multi-level;Able to live on main level with bedroom/bathroom Home Equipment: Dan HumphreysWalker - 2 wheels;Bedside commode;Shower seat Additional Comments: pt progressed to ambulating without AD after previous Lt TKA in march     Prior Function Level of Independence: Independent               Hand Dominance        Extremity/Trunk Assessment   Upper Extremity Assessment: Defer to OT evaluation           Lower Extremity Assessment: RLE deficits/detail RLE Deficits / Details: Rt quad 2+/5     Cervical / Trunk Assessment:  Normal  Communication   Communication: No difficulties  Cognition Arousal/Alertness: Awake/alert Behavior During Therapy: WFL for tasks assessed/performed Overall Cognitive Status: Within Functional Limits for tasks assessed                      General Comments      Exercises Total Joint Exercises Ankle Circles/Pumps: AROM;Both;10 reps;Supine      Assessment/Plan    PT Assessment Patient needs continued PT services  PT Diagnosis Difficulty walking;Generalized  weakness;Acute pain   PT Problem List Decreased strength;Decreased range of motion;Decreased activity tolerance;Decreased balance;Decreased mobility;Decreased knowledge of use of DME;Pain  PT Treatment Interventions DME instruction;Gait training;Functional mobility training;Therapeutic activities;Therapeutic exercise;Balance training;Neuromuscular re-education;Patient/family education   PT Goals (Current goals can be found in the Care Plan section) Acute Rehab PT Goals Patient Stated Goal: to go home Sunday PT Goal Formulation: With patient Time For Goal Achievement: 10/18/14 Potential to Achieve Goals: Good    Frequency 7X/week   Barriers to discharge        Co-evaluation               End of Session Equipment Utilized During Treatment: Right knee immobilizer;Gait belt;Oxygen Activity Tolerance: Patient tolerated treatment well Patient left: in chair;with call bell/phone within reach Nurse Communication: Mobility status;Weight bearing status;Precautions         Time: [id number] PT Time Calculation (min): 19 min   Charges:   PT Evaluation $Initial PT Evaluation Tier I: 1 Procedure PT Treatments $Therapeutic Activity: 8-22 mins   PT G CodesDonell Sievert:          Aqil Goetting N, [place] CarolinaPT  [phone] 10/15/2014, 1:31 PM

## 2014-10-15 NOTE — Anesthesia Postprocedure Evaluation (Signed)
  Anesthesia Post-op Note  Patient: Jeremy Douglas  Procedure(s) Performed: Procedure(s): RIGHT TOTAL KNEE ARTHROPLASTY (Right)  Patient Location: PACU  Anesthesia Type:MAC, Regional and Spinal  Level of Consciousness: awake and alert   Airway and Oxygen Therapy: Patient Spontanous Breathing  Post-op Pain: moderate  Post-op Assessment: Post-op Vital signs reviewed, Patient's Cardiovascular Status Stable, Respiratory Function Stable, Patent Airway, No signs of Nausea or vomiting and Pain level controlled  Post-op Vital Signs: Reviewed and stable  Last Vitals:  Filed Vitals:   10/15/14 1150  BP: 132/72  Pulse: 62  Temp:   Resp: 21    Complications: No apparent anesthesia complications

## 2014-10-15 NOTE — H&P (Signed)
TOTAL KNEE ADMISSION H&P  Patient is being admitted for right total knee arthroplasty.  Subjective:  Chief Complaint:right knee pain.  HPI: Jeremy Douglas, 58 y.o. male, has a history of pain and functional disability in the right knee due to arthritis and has failed non-surgical conservative treatments for greater than 12 weeks to includeNSAID's and/or analgesics, corticosteriod injections, viscosupplementation injections, flexibility and strengthening excercises, weight reduction as appropriate and activity modification.  Onset of symptoms was gradual, starting 5 years ago with gradually worsening course since that time. The patient noted no past surgery on the right knee(s).  Patient currently rates pain in the right knee(s) at 8 out of 10 with activity. Patient has night pain, worsening of pain with activity and weight bearing, pain that interferes with activities of daily living, pain with passive range of motion and joint swelling.  Patient has evidence of subchondral sclerosis, periarticular osteophytes, joint subluxation and joint space narrowing by imaging studies. This patient has had failure of all reasonable conservative care. There is no active infection.  Patient Active Problem List   Diagnosis Date Noted  . Osteoarthritis of left knee 03/19/2014  . Morbid obesity 03/19/2014  . Anemia 09/09/2012  . Depression 09/09/2012  . Hypophosphatemia 09/08/2012  . Transaminitis 09/08/2012  . Diarrhea 09/08/2012  . Tylenol overdose 09/07/2012  . Suicidal overdose 09/07/2012  . Hypertensive urgency 09/07/2012  . HTN (hypertension) 09/07/2012  . Hypokalemia 09/07/2012  . Alcohol dependence 09/07/2012  . Gastritis 09/07/2012  . Esophagitis 09/07/2012  . Thrombocytopenia 09/07/2012  . Chest pain 09/07/2012  . Tobacco use disorder, moderate, in sustained remission 09/10/2008  . HYPERTENSION 09/10/2008  . Obstructive sleep apnea 09/10/2008   Past Medical History  Diagnosis Date  .  Sleep apnea     pt states he is on bipap at night for sleep apnea  . Alcohol dependence 09/07/2012  . Gastritis 09/07/2012    Per EGD 04/2011  . Esophagitis 09/07/2012    Per EGD 04/2011  . Thrombocytopenia 09/07/2012    Hx of in past.  . Hypertension     takes Amlodipine,Labetalol,and Clonidine daily  . HTN (hypertension) 09/07/2012  . Headache     4 - 5 a yr (no migraines)  . Anemia     Past Surgical History  Procedure Laterality Date  . Tonsillectomy      as a child  . Rigth knee arthroscopy  06/20/2011  . Upper gi endoscopy    . Colonoscopy    . Knee arthroscopy Left 09/18/2013    Procedure: ARTHROSCOPY KNEE, PARTIAL MEDIAL AND LATERAL MENISCECTOMY, CHONDROPLASTY PATELLA FEMORAL JOINT;  Surgeon: Alta Corning, MD;  Location: [facility];  Service: Orthopedics;  Laterality: Left;  . Total knee arthroplasty Left 03/19/2014    Procedure: LEFT TOTAL KNEE ARTHROPLASTY;  Surgeon: Alta Corning, MD;  Location: [facility];  Service: Orthopedics;  Laterality: Left;    No prescriptions prior to admission   Allergies  Allergen Reactions  . Amlodipine Other (See Comments)    When taking 10 mg tablets pt had fluid retention in the legs/ankles but does not have issues with the 2.5 mg tablets     History  Substance Use Topics  . Smoking status: Former Smoker -- 0.80 packs/day for 31 years    Types: Cigarettes    Quit date: 03/14/2013  . Smokeless tobacco: Never Used  . Alcohol Use: Yes     Comment: -hx abuse      not drank X 10 mos  No family history on file.   ROS ROS: I have reviewed the patient's review of systems thoroughly and there are no positive responses as relates to the HPI. Objective:  Physical Exam  Vital signs in last 24 hours:    Filed Vitals:   10/15/14 0606  BP: 193/83  Pulse:   Temp:   Resp:    Well-developed well-nourished patient in no acute distress. Alert and oriented x3 HEENT:within normal limits Cardiac: Regular rate and rhythm Pulmonary:  Lungs clear to auscultation Abdomen: Soft and nontender.  Normal active bowel sounds  Musculoskeletal: (right knee painful range of motion.  Significant medial joint line tenderness.  Grinding through range of motion.  Mild varus malalignment. Labs: Recent Results (from the past 2160 hour(s))  SURGICAL PCR SCREEN     Status: None   Collection Time    10/12/14  1:26 PM      Result Value Ref Range   MRSA, PCR NEGATIVE  NEGATIVE   Staphylococcus aureus NEGATIVE  NEGATIVE   Comment:            The Xpert SA Assay (FDA     approved for NASAL specimens     in patients over 86 years of age),     is one component of     a comprehensive surveillance     program.  Test performance has     been validated by [employer] for patients greater     than or equal to 64 year old.     It is not intended     to diagnose infection nor to     guide or monitor treatment.  APTT     Status: None   Collection Time    10/12/14  1:32 PM      Result Value Ref Range   aPTT 37  24 - 37 seconds   Comment:            IF BASELINE aPTT IS ELEVATED,     SUGGEST PATIENT RISK ASSESSMENT     BE USED TO DETERMINE APPROPRIATE     ANTICOAGULANT THERAPY.  CBC WITH DIFFERENTIAL     Status: Abnormal   Collection Time    10/12/14  1:32 PM      Result Value Ref Range   WBC 8.2  4.0 - 10.5 K/uL   RBC 5.13  4.22 - 5.81 MIL/uL   Hemoglobin 11.3 (*) 13.0 - 17.0 g/dL   HCT 34.8 (*) 39.0 - 52.0 %   MCV 67.8 (*) 78.0 - 100.0 fL   MCH 22.0 (*) 26.0 - 34.0 pg   MCHC 32.5  30.0 - 36.0 g/dL   RDW 16.4 (*) 11.5 - 15.5 %   Platelets 216  150 - 400 K/uL   Neutrophils Relative % 58  43 - 77 %   Lymphocytes Relative 29  12 - 46 %   Monocytes Relative 9  3 - 12 %   Eosinophils Relative 3  0 - 5 %   Basophils Relative 1  0 - 1 %   Neutro Abs 4.8  1.7 - 7.7 K/uL   Lymphs Abs 2.4  0.7 - 4.0 K/uL   Monocytes Absolute 0.7  0.1 - 1.0 K/uL   Eosinophils Absolute 0.2  0.0 - 0.7 K/uL   Basophils Absolute 0.1  0.0 - 0.1 K/uL    RBC Morphology FEW TARGET CELLS AND SLIGHT POLYCHROMASIA NOTED    COMPREHENSIVE METABOLIC PANEL  Status: Abnormal   Collection Time    10/12/14  1:32 PM      Result Value Ref Range   Sodium 140  137 - 147 mEq/L   Potassium 3.8  3.7 - 5.3 mEq/L   Chloride 103  96 - 112 mEq/L   CO2 23  19 - 32 mEq/L   Glucose, Bld 98  70 - 99 mg/dL   BUN 11  6 - 23 mg/dL   Creatinine, Ser 0.86  0.50 - 1.35 mg/dL   Calcium 9.4  8.4 - 10.5 mg/dL   Total Protein 8.3  6.0 - 8.3 g/dL   Albumin 4.0  3.5 - 5.2 g/dL   AST 37  0 - 37 U/L   ALT 27  0 - 53 U/L   Alkaline Phosphatase 118 (*) 39 - 117 U/L   Total Bilirubin 0.7  0.3 - 1.2 mg/dL   GFR calc non Af Amer >90  >90 mL/min   GFR calc Af Amer >90  >90 mL/min   Comment: (NOTE)     The eGFR has been calculated using the CKD EPI equation.     This calculation has not been validated in all clinical situations.     eGFR's persistently <90 mL/min signify possible Chronic Kidney     Disease.   Anion gap 14  5 - 15  PROTIME-INR     Status: None   Collection Time    10/12/14  1:32 PM      Result Value Ref Range   Prothrombin Time 13.7  11.6 - 15.2 seconds   INR 1.04  0.00 - 1.49  URINALYSIS, ROUTINE W REFLEX MICROSCOPIC     Status: Abnormal   Collection Time    10/12/14  1:32 PM      Result Value Ref Range   Color, Urine YELLOW  YELLOW   APPearance CLEAR  CLEAR   Specific Gravity, Urine 1.020  1.005 - 1.030   pH 6.0  5.0 - 8.0   Glucose, UA NEGATIVE  NEGATIVE mg/dL   Hgb urine dipstick NEGATIVE  NEGATIVE   Bilirubin Urine NEGATIVE  NEGATIVE   Ketones, ur NEGATIVE  NEGATIVE mg/dL   Protein, ur 30 (*) NEGATIVE mg/dL   Urobilinogen, UA 0.2  0.0 - 1.0 mg/dL   Nitrite NEGATIVE  NEGATIVE   Leukocytes, UA NEGATIVE  NEGATIVE  URINE MICROSCOPIC-ADD ON     Status: None   Collection Time    10/12/14  1:32 PM      Result Value Ref Range   Squamous Epithelial / LPF RARE  RARE   WBC, UA 0-2  <3 WBC/hpf   RBC / HPF 0-2  <3 RBC/hpf  TYPE AND SCREEN      Status: None   Collection Time    10/12/14  1:36 PM      Result Value Ref Range   ABO/RH(D) A POS     Antibody Screen NEG     Sample Expiration 10/26/2014      Estimated body mass index is 38.91 kg/(m^2) as calculated from the following:   Height as of 03/19/14: 5' 10"  (1.778 m).   Weight as of 03/16/14: 271 lb 2.6 oz (122.999 kg).   Imaging Review Plain radiographs demonstrate severe degenerative joint disease of the right knee(s). The overall alignment ismild varus. The bone quality appears to be good for age and reported activity level.  Assessment/Plan:  End stage arthritis, right knee   The patient history, physical examination, clinical judgment of the  provider and imaging studies are consistent with end stage degenerative joint disease of the right knee(s) and total knee arthroplasty is deemed medically necessary. The treatment options including medical management, injection therapy arthroscopy and arthroplasty were discussed at length. The risks and benefits of total knee arthroplasty were presented and reviewed. The risks due to aseptic loosening, infection, stiffness, patella tracking problems, thromboembolic complications and other imponderables were discussed. The patient acknowledged the explanation, agreed to proceed with the plan and consent was signed. Patient is being admitted for inpatient treatment for surgery, pain control, PT, OT, prophylactic antibiotics, VTE prophylaxis, progressive ambulation and ADL's and discharge planning. The patient is planning to be discharged home with home health services

## 2014-10-15 NOTE — Brief Op Note (Signed)
10/15/2014  8:57 AM  PATIENT:  Jeremy Douglas  58 y.o. male  PRE-OPERATIVE DIAGNOSIS:  degenerative joint disease  POST-OPERATIVE DIAGNOSIS:  Degenerative Joint Disease  PROCEDURE:  Procedure(s): RIGHT TOTAL KNEE ARTHROPLASTY (Right)  SURGEON:  Surgeon(s) and Role:    * [name] Alyissa Whidbee, MD - Primary  PHYSICIAN ASSISTANT:   ASSISTANTS: bethune   ANESTHESIA:   spinal  EBL:  Total I/O In: -  Out: 500 [Urine:500]  BLOOD ADMINISTERED:none  DRAINS: (1) Hemovact drain(s) in the r knee with  Suction Open   LOCAL MEDICATIONS USED:  MARCAINE     SPECIMEN:  No Specimen  DISPOSITION OF SPECIMEN:  N/A  COUNTS:  YES  TOURNIQUET:  * Missing tourniquet times found for documented tourniquets in log:  [id number] *  DICTATION: .Other Dictation: Dictation Number [id number]  PLAN OF CARE: Admit to inpatient   PATIENT DISPOSITION:  PACU - hemodynamically stable.   Delay start of Pharmacological VTE agent (>24hrs) due to surgical blood loss or risk of bleeding: no

## 2014-10-15 NOTE — Transfer of Care (Signed)
Immediate Anesthesia Transfer of Care Note  Patient: Jeremy PlowmanRoger C Douglas  Procedure(s) Performed: Procedure(s): RIGHT TOTAL KNEE ARTHROPLASTY (Right)  Patient Location: PACU  Anesthesia Type:MAC, Regional and Spinal  Level of Consciousness: awake, alert  and oriented  Airway & Oxygen Therapy: Patient Spontanous Breathing  Post-op Assessment: Report given to PACU RN  Post vital signs: Reviewed and stable  Complications: No apparent anesthesia complications

## 2014-10-16 LAB — CBC
HCT: 29.5 % — ABNORMAL LOW (ref 39.0–52.0)
HEMOGLOBIN: 9.5 g/dL — AB (ref 13.0–17.0)
MCH: 21.5 pg — ABNORMAL LOW (ref 26.0–34.0)
MCHC: 32.2 g/dL (ref 30.0–36.0)
MCV: 66.7 fL — AB (ref 78.0–100.0)
PLATELETS: 218 10*3/uL (ref 150–400)
RBC: 4.42 MIL/uL (ref 4.22–5.81)
RDW: 16 % — ABNORMAL HIGH (ref 11.5–15.5)
WBC: 10.3 10*3/uL (ref 4.0–10.5)

## 2014-10-16 LAB — BASIC METABOLIC PANEL
Anion gap: 15 (ref 5–15)
BUN: 19 mg/dL (ref 6–23)
CHLORIDE: 103 meq/L (ref 96–112)
CO2: 19 meq/L (ref 19–32)
CREATININE: 1.14 mg/dL (ref 0.50–1.35)
Calcium: 9 mg/dL (ref 8.4–10.5)
GFR calc Af Amer: 81 mL/min — ABNORMAL LOW (ref >90)
GFR calc non Af Amer: 70 mL/min — ABNORMAL LOW (ref >90)
Glucose, Bld: 87 mg/dL (ref 70–99)
Potassium: 4.3 mEq/L (ref 3.7–5.3)
SODIUM: 137 meq/L (ref 137–147)

## 2014-10-16 MED ORDER — KETOROLAC TROMETHAMINE 15 MG/ML IJ SOLN
INTRAMUSCULAR | Status: AC
  Administered 2014-10-16: 15 mg
  Filled 2014-10-16: qty 1

## 2014-10-16 NOTE — Care Management Note (Signed)
CARE MANAGEMENT NOTE 10/16/2014  Patient:  Rennie PlowmanCAMPBELL,Marlowe C   Account Number:  [id number]  Date Initiated:  10/16/2014  Documentation initiated by:  Vance PeperBRADY,Avaya Mcjunkins  Subjective/Objective Assessment:   58 yr old male admitted with DJD of right knee. Patient s/p right total knee arthroplasty.     Action/Plan:   Case manager spoke nwith patient concerning Home Health and DME.Patient preoperatively setup with [facility], no changes. patient has family support at discharge.   Anticipated DC Date:  10/17/2014   Anticipated DC Plan:  HOME W HOME HEALTH SERVICES      DC Planning Services  CM consult      [facility]  [facility]  DURABLE MEDICAL EQUIPMENT   Choice offered to / List presented to:  C-1 Patient   DME arranged  [facility]  3-N-1  CPM      DME agency  [employer]     HH arranged  HH-2 PT      HH agency  [facility]   Status of service:  Completed, signed off Medicare Important Message given?   (If response is "NO", the following Medicare IM given date fields will be blank) Date Medicare IM given:   Medicare IM given by:   Date Additional Medicare IM given:   Additional Medicare IM given by:    Discharge Disposition:  HOME W HOME HEALTH SERVICES  Per UR Regulation:  Reviewed for med. necessity/level of care/duration of sta

## 2014-10-16 NOTE — Progress Notes (Signed)
PATIENT ID: Rennie PlowmanRoger C Fodor  MRN: [id number]  DOB/AGE:  07-23-1956 / 58 y.o.  1 Day Post-Op Procedure(s) (LRB): RIGHT TOTAL KNEE ARTHROPLASTY (Right)    PROGRESS NOTE Subjective: Patient is alert, oriented, no Nausea, no Vomiting, yes passing gas, no Bowel Movement. Taking PO well. Denies SOB, Chest or Calf Pain. Using Incentive Spirometer, PAS in place. Ambulate WBAT, CPM 0-90 Patient reports pain as 7 on 0-10 scale  .    Objective: Vital signs in last 24 hours: Filed Vitals:   10/15/14 1954 10/15/14 2218 10/16/14 0002 10/16/14 0514  BP: 176/69  181/65 165/71  Pulse: 85  95 73  Temp: 100.6 F (38.1 C) 99.9 F (37.7 C) 99.6 F (37.6 C) 98.2 F (36.8 C)  TempSrc:      Resp: 20  20 20   Height:      Weight:      SpO2: 95%  91% 95%      Intake/Output from previous day: I/O last 3 completed shifts: In: 1240 [P.O.:240; I.V.:1000] Out: 2750 [Urine:2175; Drains:575]   Intake/Output this shift:     LABORATORY DATA:  Recent Labs  10/16/14 0418  WBC 10.3  HGB 9.5*  HCT 29.5*  PLT 218  NA 137  K 4.3  CL 103  CO2 19  BUN 19  CREATININE 1.14  GLUCOSE 87  CALCIUM 9.0    Examination: Neurologically intact Neurovascular intact Sensation intact distally Intact pulses distally Dorsiflexion/Plantar flexion intact Incision: no drainage No cellulitis present Compartment soft}  Blood and plasma separated in drain indicating minimal recent drainage, drain pulled without difficulty.  Assessment:   1 Day Post-Op Procedure(s) (LRB): RIGHT TOTAL KNEE ARTHROPLASTY (Right) ADDITIONAL DIAGNOSIS: Expected Acute Blood Loss Anemia, Hypertension and Sleep Apnea  Plan: PT/OT WBAT, CPM 5/hrs day until ROM 0-90 degrees, then D/C CPM DVT Prophylaxis:  SCDx72hrs, ASA 325 mg BID x 2 weeks DISCHARGE PLAN: Home, when pt passes therapy goals DISCHARGE NEEDS: HHPT, HHRN, CPM, Walker and 3-in-1 comode seat     Jaianna Nicoll R 10/16/2014, 7:40 AM

## 2014-10-16 NOTE — Progress Notes (Signed)
RT note: Pt has home CPAP at bedside to wear tonight. H2o filled. Pt states he does not need further assistance and can place himself on when ready

## 2014-10-16 NOTE — Progress Notes (Signed)
Orthopedic Tech Progress Note Patient Details:  Rennie PlowmanRoger C Turbyfill 1956/08/05 [id number]  Patient ID: Rennie Plowmanoger C Saefong, male   DOB: 1956/08/05, 58 y.o.   MRN: [id number] Placed pt's rle in cpm @ 0-65 degrees @1505   Nikki DomCrawford, Kathern Lobosco 10/16/2014, 3:00 PM

## 2014-10-16 NOTE — Progress Notes (Signed)
Physical Therapy Treatment Patient Details Name: Jeremy PlowmanRoger C Douglas MRN: [id number] DOB: [birth year] Today's Date: 10/16/2014    History of Present Illness Pt is a [age] y.o. male s/p Rt TKA; previous Lt TKA in March     PT Comments    Pt slowly progressing with mobility. Pt with heavy breathing during session; requiring multiple standing rest breaks. Will cont to follow per POC. Pt hopes to D/C home tomorrow.   Follow Up Recommendations  Home health PT;Supervision/Assistance - 24 hour     Equipment Recommendations  None recommended by PT    Recommendations for Other Services OT consult     Precautions / Restrictions Precautions Precautions: Knee Precaution Comments: reviewed knee precautions  Required Braces or Orthoses: Knee Immobilizer - Right Knee Immobilizer - Right: On when out of bed or walking Restrictions Weight Bearing Restrictions: Yes RLE Weight Bearing: Weight bearing as tolerated    Mobility  Bed Mobility               General bed mobility comments: not addressed   Transfers Overall transfer level: Needs assistance Equipment used: Rolling walker (2 wheeled) Transfers: Sit to/from Stand Sit to Stand: Supervision         General transfer comment: cues for technique   Ambulation/Gait Ambulation/Gait assistance: Min guard Ambulation Distance (Feet): 80 Feet Assistive device: Rolling walker (2 wheeled) Gait Pattern/deviations: Step-to pattern;Decreased stance time - right;Decreased step length - left;Trunk flexed Gait velocity: decr Gait velocity interpretation: Below normal speed for age/gender General Gait Details: cues for step through gt and upright posture; pt limited by fatigue; required standing rest breaks due to fatigue; pt with heavy breathing during session   Stairs            Wheelchair Mobility    Modified Rankin (Stroke Patients Only)       Balance Overall balance assessment: Needs assistance         Standing  balance support: During functional activity;Bilateral upper extremity supported Standing balance-Leahy Scale: Poor Standing balance comment: relies on RW for balance                     Cognition Arousal/Alertness: Awake/alert Behavior During Therapy: WFL for tasks assessed/performed Overall Cognitive Status: Within Functional Limits for tasks assessed                      Exercises Total Joint Exercises Ankle Circles/Pumps: AROM;Both;10 reps;Supine Quad Sets: AROM;Right;10 reps;Seated Heel Slides: AAROM;Right;10 reps;Seated Hip ABduction/ADduction: AAROM;Strengthening;Right;10 reps Long Arc Quad: AROM;Right;10 reps;Seated Goniometric ROM: AROM in sitting to 70 degrees limited by pain    General Comments General comments (skin integrity, edema, etc.): pt unable to tolerate bone foam      Pertinent Vitals/Pain Pain Assessment: 0-10 Pain Score: 9  Pain Location: Rt knee with ambulation  Pain Descriptors / Indicators: Aching;Sore Pain Intervention(s): Monitored during session;Premedicated before session;Repositioned    Home Living                      Prior Function            PT Goals (current goals can now be found in the care plan section) Acute Rehab PT Goals Patient Stated Goal: to go home tomorrow  PT Goal Formulation: With patient Time For Goal Achievement: 10/18/14 Potential to Achieve Goals: Good Progress towards PT goals: Progressing toward goals    Frequency  7X/week    PT Plan Current plan remains appropriate  Co-evaluation             End of Session Equipment Utilized During Treatment: Right knee immobilizer;Gait belt;Oxygen Activity Tolerance: Patient tolerated treatment well Patient left: in chair;with call bell/phone within reach     Time: 1116-1140 PT Time Calculation (min): 24 min  Charges:  $Gait Training: 8-22 mins $Therapeutic Exercise: 8-22 mins                    G Codes:      Donell SievertWest, Avalin Briley N, [place] CarolinaPT   [phone] 10/16/2014, 12:50 PM

## 2014-10-17 LAB — CBC
HEMATOCRIT: 27.6 % — AB (ref 39.0–52.0)
HEMOGLOBIN: 9 g/dL — AB (ref 13.0–17.0)
MCH: 21.3 pg — ABNORMAL LOW (ref 26.0–34.0)
MCHC: 32.6 g/dL (ref 30.0–36.0)
MCV: 65.2 fL — ABNORMAL LOW (ref 78.0–100.0)
Platelets: 219 10*3/uL (ref 150–400)
RBC: 4.23 MIL/uL (ref 4.22–5.81)
RDW: 15.7 % — ABNORMAL HIGH (ref 11.5–15.5)
WBC: 11.9 10*3/uL — ABNORMAL HIGH (ref 4.0–10.5)

## 2014-10-17 NOTE — Progress Notes (Signed)
PATIENT ID: Jeremy PlowmanRoger C Douglas  MRN: [id number]  DOB/AGE:  1956-05-19 / 58 y.o.  2 Days Post-Op Procedure(s) (LRB): RIGHT TOTAL KNEE ARTHROPLASTY (Right)    PROGRESS NOTE Subjective: Patient is alert, oriented, no Nausea, no Vomiting, yes passing gas, no Bowel Movement. Taking PO well. Denies SOB, Chest or Calf Pain. Using Incentive Spirometer, PAS in place. Ambulate WBAT, CPM 0-90 Patient reports pain as 7 on 0-10 scale and 8 on 0-10 scale  .    Objective: Vital signs in last 24 hours: Filed Vitals:   10/16/14 1451 10/16/14 2002 10/16/14 2158 10/17/14 0601  BP: 152/72 180/87 169/63 157/65  Pulse: 82 86  78  Temp: 98.4 F (36.9 C) 99.1 F (37.3 C)  99.4 F (37.4 C)  TempSrc: Oral Oral  Oral  Resp: 18 20  20   Height:      Weight:      SpO2: 93% 95%  93%      Intake/Output from previous day: I/O last 3 completed shifts: In: 2305 [P.O.:1680; I.V.:625] Out: 3150 [Urine:2850; Drains:300]   Intake/Output this shift:     LABORATORY DATA:  Recent Labs  10/16/14 0418 10/17/14 0440  WBC 10.3 11.9*  HGB 9.5* 9.0*  HCT 29.5* 27.6*  PLT 218 219  NA 137  --   K 4.3  --   CL 103  --   CO2 19  --   BUN 19  --   CREATININE 1.14  --   GLUCOSE 87  --   CALCIUM 9.0  --     Examination: Neurologically intact Neurovascular intact Sensation intact distally Intact pulses distally Dorsiflexion/Plantar flexion intact Incision: dressing C/D/I No cellulitis present Compartment soft}  Assessment:   2 Days Post-Op Procedure(s) (LRB): RIGHT TOTAL KNEE ARTHROPLASTY (Right) ADDITIONAL DIAGNOSIS: Expected Acute Blood Loss Anemia, Hypertension and Sleep Apnea  Plan: PT/OT WBAT, CPM 5/hrs day until ROM 0-90 degrees, then D/C CPM DVT Prophylaxis:  SCDx72hrs, ASA 325 mg BID x 2 weeks DISCHARGE PLAN: Home today once he has passed his therapy goals. DISCHARGE NEEDS: HHPT, HHRN, CPM, Walker and 3-in-1 comode seat     Annelle Behrendt R 10/17/2014, 8:06 AM

## 2014-10-17 NOTE — Progress Notes (Signed)
Patient discharge information given and questions answered. Prescriptions given to patient. IV removed. Patient discharged home via wheelchair by staff member.

## 2014-10-17 NOTE — Discharge Summary (Signed)
Patient ID: Jeremy Douglas MRN: [id number] DOB/AGE: April 04, 1956 58 y.o.  Admit date: 10/15/2014 Discharge date: 10/17/2014  Admission Diagnoses:  Principal Problem:   Primary osteoarthritis of right knee   Discharge Diagnoses:  Same  Past Medical History  Diagnosis Date  . Sleep apnea     pt states he is on bipap at night for sleep apnea  . Alcohol dependence 09/07/2012  . Gastritis 09/07/2012    Per EGD 04/2011  . Esophagitis 09/07/2012    Per EGD 04/2011  . Thrombocytopenia 09/07/2012    Hx of in past.  . Hypertension     takes Amlodipine,Labetalol,and Clonidine daily  . HTN (hypertension) 09/07/2012  . Headache     4 - 5 a yr (no migraines)  . Anemia     Surgeries: Procedure(s): RIGHT TOTAL KNEE ARTHROPLASTY on 10/15/2014   Consultants:    Discharged Condition: Improved  Hospital Course: BASIL BUFFIN is an 58 y.o. male who was admitted 10/15/2014 for operative treatment ofPrimary osteoarthritis of right knee. Patient has severe unremitting pain that affects sleep, daily activities, and work/hobbies. After pre-op clearance the patient was taken to the operating room on 10/15/2014 and underwent  Procedure(s): RIGHT TOTAL KNEE ARTHROPLASTY.    Patient was given perioperative antibiotics: Anti-infectives   Start     Dose/Rate Route Frequency Ordered Stop   10/15/14 1230  ceFAZolin (ANCEF) IVPB 2 g/50 mL premix     2 g 100 mL/hr over 30 Minutes Intravenous Every 6 hours 10/15/14 1216 10/15/14 1910   10/15/14 0600  ceFAZolin (ANCEF) 3 g in dextrose 5 % 50 mL IVPB     3 g 160 mL/hr over 30 Minutes Intravenous On call to O.R. 10/14/14 1437 10/15/14 0753       Patient was given sequential compression devices, early ambulation, and chemoprophylaxis to prevent DVT.  Patient benefited maximally from hospital stay and there were no complications.    Recent vital signs: Patient Vitals for the past 24 hrs:  BP Temp Temp src Pulse Resp SpO2  10/17/14 0601 157/65 mmHg  99.4 F (37.4 C) Oral 78 20 93 %  10/16/14 2158 169/63 mmHg - - - - -  10/16/14 2002 180/87 mmHg 99.1 F (37.3 C) Oral 86 20 95 %  10/16/14 1451 152/72 mmHg 98.4 F (36.9 C) Oral 82 18 93 %  10/16/14 0945 157/65 mmHg - - 81 - -     Recent laboratory studies:  Recent Labs  10/16/14 0418 10/17/14 0440  WBC 10.3 11.9*  HGB 9.5* 9.0*  HCT 29.5* 27.6*  PLT 218 219  NA 137  --   K 4.3  --   CL 103  --   CO2 19  --   BUN 19  --   CREATININE 1.14  --   GLUCOSE 87  --   CALCIUM 9.0  --      Discharge Medications:     Medication List    STOP taking these medications       HYDROcodone-acetaminophen 7.5-325 MG per tablet  Commonly known as:  NORCO      TAKE these medications       amLODipine 2.5 MG tablet  Commonly known as:  NORVASC  Take 2.5 mg by mouth 2 (two) times daily.     aspirin EC 325 MG tablet  Take 1 tablet (325 mg total) by mouth 2 (two) times daily after a meal.     cloNIDine 0.1 MG tablet  Commonly known as:  CATAPRES  Take 0.1 mg by mouth 2 (two) times daily.     cyclobenzaprine 10 MG tablet  Commonly known as:  FLEXERIL  Take 10 mg by mouth 3 (three) times daily as needed for muscle spasms.     furosemide 40 MG tablet  Commonly known as:  LASIX  Take 40 mg by mouth daily.     labetalol 200 MG tablet  Commonly known as:  NORMODYNE  Take 200 mg by mouth 2 (two) times daily.     multivitamin with minerals Tabs tablet  Take 1 tablet by mouth daily.     OxyCODONE 20 mg T12a 12 hr tablet  Commonly known as:  OXYCONTIN  Take 1 tablet (20 mg total) by mouth every 12 (twelve) hours.     oxyCODONE-acetaminophen 5-325 MG per tablet  Commonly known as:  PERCOCET/ROXICET  Take 1-2 tablets by mouth every 4 (four) hours as needed for severe pain.     sildenafil 50 MG tablet  Commonly known as:  VIAGRA  Take 50 mg by mouth daily as needed for erectile dysfunction.     SUPER B COMPLEX PO  Take 1 tablet by mouth daily.     zolpidem 10 MG tablet   Commonly known as:  AMBIEN  Take 10 mg by mouth at bedtime as needed for sleep.        Diagnostic Studies: No results found.  Disposition: 06-Home-Health Care Svc      Discharge Instructions   CPM    Complete by:  As directed   Continuous passive motion machine (CPM):      Use the CPM from 0 to 60  for 5 hours per day.      You may increase by 10 degrees per day.  You may break it up into 2 or 3 sessions per day.      Use CPM for 2 weeks or until you are told to stop.     Call MD / Call 911    Complete by:  As directed   If you experience chest pain or shortness of breath, CALL 911 and be transported to the hospital emergency room.  If you develope a fever above 101 F, pus (white drainage) or increased drainage or redness at the wound, or calf pain, call your surgeon's office.     Change dressing    Complete by:  As directed   Change dressing on 5, then change the dressing daily with sterile 4 x 4 inch gauze dressing and apply TED hose.  You may clean the incision with alcohol prior to redressing.     Constipation Prevention    Complete by:  As directed   Drink plenty of fluids.  Prune juice may be helpful.  You may use a stool softener, such as Colace (over the counter) 100 mg twice a day.  Use MiraLax (over the counter) for constipation as needed.     Diet - low sodium heart healthy    Complete by:  As directed      Discharge instructions    Complete by:  As directed   Follow up in office with Dr. Luiz BlareGraves as previously discussed.     Driving restrictions    Complete by:  As directed   No driving for 2 weeks     Increase activity slowly as tolerated    Complete by:  As directed      Patient may shower    Complete by:  As directed   You may  shower without a dressing once there is no drainage.  Do not wash over the wound.  If drainage remains, cover wound with plastic wrap and then shower.     Weight bearing as tolerated    Complete by:  As directed   Laterality:  right   Extremity:  Lower           Follow-up Information   Follow up with GRAVES,JOHN L, MD. Schedule an appointment as soon as possible for a visit in 2 weeks.   Specialty:  Orthopedic Surgery   Contact information:   [address] Spence[address] [place] [id number] [phone]       Follow up with [facility]. (Someone from [facility] will contact you concerning start date and time for physical therapist.)    Contact information:   [address] [place] [address] [phone]        Signed: Vear ClockHILLIPS, Flois Mctague R 10/17/2014, 8:09 AM

## 2014-10-17 NOTE — Progress Notes (Signed)
Physical Therapy Treatment Patient Details Name: Jeremy Douglas MRN: [id number] DOB: 1956-10-22 Today's Date: 10/17/2014    History of Present Illness Pt is a 58 y.o. male s/p Rt TKA; previous Lt TKA in March     PT Comments    Pt continues to be limited by pain and fatigue. Pt hopeful to D/C home today and will have 24/7 (A). Recommend HHPT upon D/C for cont therapy.   Follow Up Recommendations  Home health PT;Supervision/Assistance - 24 hour     Equipment Recommendations  None recommended by PT    Recommendations for Other Services       Precautions / Restrictions Precautions Precautions: Knee Precaution Comments: reviewed knee precautions  Required Braces or Orthoses: Knee Immobilizer - Right Knee Immobilizer - Right: On when out of bed or walking Restrictions Weight Bearing Restrictions: Yes RLE Weight Bearing: Weight bearing as tolerated    Mobility  Bed Mobility Overal bed mobility: Modified Independent Bed Mobility: Supine to Sit           General bed mobility comments: relying one handrails and trapeze bar   Transfers Overall transfer level: Needs assistance Equipment used: Rolling walker (2 wheeled) Transfers: Sit to/from Stand Sit to Stand: Supervision         General transfer comment: cues for safe technique   Ambulation/Gait Ambulation/Gait assistance: Supervision Ambulation Distance (Feet): 100 Feet Assistive device: Rolling walker (2 wheeled) Gait Pattern/deviations: Step-to pattern;Decreased step length - left;Decreased stance time - right;Antalgic;Trunk flexed;Wide base of support Gait velocity: decr Gait velocity interpretation: Below normal speed for age/gender General Gait Details: pt requiring multiple standing rest breaks and leaning over RW; cues for safe techique and step to gt   Stairs Stairs:  (reviewed verbally; pt refused need to practice )          Wheelchair Mobility    Modified Rankin (Stroke Patients Only)        Balance Overall balance assessment: Needs assistance Sitting-balance support: Feet supported;No upper extremity supported Sitting balance-Leahy Scale: Good     Standing balance support: During functional activity;Bilateral upper extremity supported Standing balance-Leahy Scale: Poor Standing balance comment: pt relies on RW for balance                     Cognition Arousal/Alertness: Awake/alert Behavior During Therapy: WFL for tasks assessed/performed Overall Cognitive Status: Within Functional Limits for tasks assessed                      Exercises Total Joint Exercises Ankle Circles/Pumps: AROM;Both;10 reps;Supine Quad Sets: AROM;Right;10 reps;Seated Long Arc Quad: AAROM;10 reps;Right;Seated;Other (comment) (incr time due to pain) Knee Flexion: AAROM;Right;5 reps;Seated;Other (comment) (limited by pain) Goniometric ROM: knee flexion limited today due to pain to 60 degrees    General Comments General comments (skin integrity, edema, etc.): reviewed car transffer technique and encouraged to have throw rugs put away for safety upon returning home      Pertinent Vitals/Pain Pain Assessment: 0-10 Pain Score: 8  Pain Location: Rt knee with movement  Pain Descriptors / Indicators: Aching Pain Intervention(s): Limited activity within patient's tolerance;Monitored during session;Premedicated before session;Ice applied    Home Living                      Prior Function            PT Goals (current goals can now be found in the care plan section) Acute Rehab PT Goals Patient Stated  Goal: home today PT Goal Formulation: With patient Time For Goal Achievement: 10/18/14 Potential to Achieve Goals: Good Progress towards PT goals: Progressing toward goals    Frequency  7X/week    PT Plan Current plan remains appropriate    Co-evaluation             End of Session Equipment Utilized During Treatment: Right knee immobilizer;Gait  belt;Oxygen Activity Tolerance: Patient limited by pain Patient left: in bed;with call bell/phone within reach;with family/visitor present     Time: [id number] PT Time Calculation (min): 24 min  Charges:  $Gait Training: 8-22 mins $Therapeutic Activity: 8-22 mins                    G CodesDonnamarie Poag:      [name] [place], [place]  [phone] 10/17/2014, 10:13 AM

## 2014-10-19 ENCOUNTER — Encounter (HOSPITAL_COMMUNITY): Payer: Self-pay | Admitting: Orthopedic Surgery

## 2015-01-06 ENCOUNTER — Encounter: Payer: Self-pay | Admitting: Internal Medicine

## 2015-01-06 ENCOUNTER — Ambulatory Visit (INDEPENDENT_AMBULATORY_CARE_PROVIDER_SITE_OTHER): Payer: BLUE CROSS/BLUE SHIELD | Admitting: Internal Medicine

## 2015-01-06 VITALS — BP 138/62 | HR 69 | Ht 70.0 in | Wt 291.2 lb

## 2015-01-06 DIAGNOSIS — G4733 Obstructive sleep apnea (adult) (pediatric): Secondary | ICD-10-CM

## 2015-01-06 NOTE — Patient Instructions (Addendum)
Needs script written: CPAP mask of choice and supplies  Dx OSA  We can continue BIPAP Autoset     Please call as needed

## 2015-01-06 NOTE — Progress Notes (Signed)
Subjective:    Patient ID: Jeremy Douglas, male    DOB: 06-20-1956, 59 y.o.   MRN: [id number]  HPI 06/12/11- 59 yo smoker followed for OSA, complicated by HBP.  Last here 11/26/08-  Note reviewed. We had changed his machine to BiPap 20/15. His old machine shorted out. Couldn't sleep at all without it. Now using a temporary loaner CPAP machine at 15 which isn't as good. Likes a "rim" gasket that improves seal. He discussed this with his orthopedic office pending knee surgery next week. We discussed CPAP vs BiPAP and also surgery issues.   11/28/12- 59 yo smoker followed for OSA, complicated by HBP.  FOLLOWS FOR: having trouble with hypertension; Wears BiPAP/Autopap 20/17 every night and during naps- for about 7-8 hours; Changed pressure to 16.8/20 from 15/20. Can't sleep without his BiPAP. Download indicates AutoPap is holding a pressure near 18 for AHI 0. He has a fullface mask with a special liner he found that improves leak control. Still waking several times at night briefly but feels rested. He now gets most of his supplies online. Had flu vaccine. [facility]  01/05/14- 59 yo former smoker followed for OSA, complicated by HBP. FOLLOWS FOR: continues to wear   BiPAP/Autopap 20/17  every night and with naps; no DME company-buys supplies through [email or web address].com on web. He says he is comfortable with his BiPAP and uses it all night every night. Has 2 machines. We discussed maintenance. Uses on-line supply company. He remains off cigarettes and denies cough or acute illness.  01/06/15-58 yo former smoker followed for OSA, complicated by HBP. FOLLOWS FOR: Wears PAP every night; gets all supplies online and does not deal with [employer] at all. Not clear if he is on CPAP Autoset range 15-20, or on BIPAP I 20/ E 15. We will try to read his card. He has not been able to lose significant weight.  ROS-see HPI Constitutional:   No-   weight loss, night sweats, fevers,  chills, fatigue, lassitude. HEENT:   No-  headaches, difficulty swallowing, tooth/dental problems, sore throat,       No-  sneezing, itching, ear ache, nasal congestion, post nasal drip,  CV:  No-   chest pain, orthopnea, PND, swelling in lower extremities, anasarca,  dizziness, palpitations Resp: No-   shortness of breath with exertion or at rest.              No-   productive cough,  No non-productive cough,  No- coughing up of blood.              No-   change in color of mucus.  No- wheezing.   Skin: No-   rash or lesions. GI:  No-   heartburn, indigestion, abdominal pain, nausea, vomiting,  GU: . MS:  No-   joint pain or swelling.   Neuro-     nothing unusual Psych:  No- change in mood or affect. No depression or anxiety.  No memory loss.    Objective:  OBJ- Physical Exam General- Alert, Oriented, Affect-appropriate, Distress- none acute. Obese Skin- rash-none, lesions- none, excoriation- none Lymphadenopathy- none Head- atraumatic            Eyes- Gross vision intact, PERRLA, conjunctivae and secretions clear            Ears- Hearing, canals-normal            Nose- Clear, no-Septal dev, mucus, polyps, erosion, perforation  Throat- Mallampati III , mucosa clear , drainage- none, tonsils- atrophic Neck- flexible , trachea midline, no stridor , thyroid nl, carotid no bruit Chest - symmetrical excursion , unlabored           Heart/CV- RRR , no murmur , no gallop  , no rub, nl s1 s2                           - JVD- none , edema- none, stasis changes- none, varices- none           Lung- clear to P&A, wheeze- none, cough- none , dullness-none, rub- none           Chest wall-  Abd-  Br/ Gen/ Rectal- Not done, not indicated Extrem- cyanosis- none, clubbing, none, atrophy- none, strength- nl Neuro- grossly intact to observation  Assessment & Plan:

## 2015-01-08 NOTE — Assessment & Plan Note (Signed)
Positive, constructive support but he has not been able to get his weight down significantly.

## 2015-01-08 NOTE — Assessment & Plan Note (Signed)
We discussed settings of his machine. Need to clarify and hopefully our device can read his scan disc He describes excellent compliance and control with no concerns.

## 2016-02-14 ENCOUNTER — Encounter: Payer: Self-pay | Admitting: Cardiovascular Disease

## 2016-02-14 ENCOUNTER — Ambulatory Visit (INDEPENDENT_AMBULATORY_CARE_PROVIDER_SITE_OTHER): Payer: BLUE CROSS/BLUE SHIELD | Admitting: Cardiovascular Disease

## 2016-02-14 DIAGNOSIS — R079 Chest pain, unspecified: Secondary | ICD-10-CM

## 2016-02-14 NOTE — Progress Notes (Signed)
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 He is self referred because of friend of his, Ernst Spell, is also a patient of mine. He has a history of 30-pack-years of tobacco abuse continued to smoke one pack per day was treated hypertension. His father did have coronary artery disease in his 37s. He has never had a heart attack or stroke. He has had bilateral knee replacements and has obstructive sleep apnea on C Pap. There is a history of alcoholism in the past. He has had remote chest pain but recently has developed exertional chest pain was fairly reproducible with left neck and upper extremity radiation with shortness of breath. He says subsided over the last several weeks however.  Current Outpatient Prescriptions  Medication Sig Dispense Refill  . aspirin 81 MG tablet Take 81 mg by mouth daily.    . B Complex-C (SUPER B COMPLEX PO) Take 1 tablet by mouth daily.    . baclofen (LIORESAL) 10 MG tablet Take 10 mg by mouth as needed for muscle spasms.    . cloNIDine (CATAPRES) 0.1 MG tablet Take 0.2 mg by mouth 2 (two) times daily.     . furosemide (LASIX) 40 MG tablet Take 40 mg by mouth daily.    . hydrOXYzine (ATARAX/VISTARIL) 10 MG tablet Take 10 mg by mouth as needed.  2  . labetalol (NORMODYNE) 200 MG tablet Take 200 mg by mouth 2 (two) times daily.     Marland Kitchen LYRICA 100 MG capsule Take 100 mg by mouth at bedtime.  2  . Multiple Vitamin (MULITIVITAMIN WITH MINERALS) TABS Take 1 tablet by mouth daily.     . OxyCODONE (OXYCONTIN) 20 mg T12A 12 hr tablet Take 1 tablet (20 mg total) by mouth every 12 (twelve) hours. (Patient taking differently: Take 40 mg by mouth every 12 (twelve) hours. ) 30 tablet 0  . oxyCODONE-acetaminophen  (PERCOCET/ROXICET) 5-325 MG per tablet Take 1-2 tablets by mouth every 4 (four) hours as needed for severe pain. 60 tablet 0  . sildenafil (VIAGRA) 50 MG tablet Take 50 mg by mouth daily as needed for erectile dysfunction.      No current facility-administered medications for this visit.    Allergies  Allergen Reactions  . Amlodipine Other (See Comments)    When taking 10 mg tablets pt had fluid retention in the legs/ankles but does not have issues with the 2.5 mg tablets     Social History   Social History  . Marital Status: Divorced    Spouse Name: N/A  . Number of Children: N/A  . Years of Education: N/A   Occupational History  . 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 Smoking status: Former Smoker -- 0.80 packs/day for 31 years    Types: Cigarettes    Quit date: 03/14/2013  . Smokeless tobacco: Never Used  . Alcohol Use: Yes     Comment: -hx abuse      not drank X 10 mos  . Drug Use: No  . Sexual Activity: No   Other Topics Concern  . Not on file   Social History Narrative     Review of Systems: General: negative for chills, fever, night  sweats or weight changes.  Cardiovascular: negative for chest pain, dyspnea on exertion, edema, orthopnea, palpitations, paroxysmal nocturnal dyspnea or shortness of breath Dermatological: negative for rash Respiratory: negative for cough or wheezing Urologic: negative for hematuria Abdominal: negative for nausea, vomiting, diarrhea, bright red blood per rectum, melena, or hematemesis Neurologic: negative for visual changes, syncope, or dizziness All other systems reviewed and are otherwise negative except as noted above.    Blood pressure 168/80, pulse 48, height  (1.778 m), weight 294 lb (133.358 kg).  General appearance: alert and no distress Neck: no adenopathy, no carotid bruit, no JVD, supple, symmetrical, trachea midline and thyroid not enlarged, symmetric, no  tenderness/mass/nodules Lungs: clear to auscultation bilaterally Heart: regular rate and rhythm, S1, S2 normal, no murmur, click, rub or gallop Extremities: extremities normal, atraumatic, no cyanosis or edema and 2+ pedal pulses bilaterally  EKG sinus bradycardia of 48 without ST or T-wave changes. I personally reviewed this EKG  ASSESSMENT AND PLAN:   Essential hypertension History of hypertension followed by Dr. Pamelia Hoit at [facility]. Blood pressure today is 168/80 although at home is much better than this. He is on clonidine, and labetalol. Continue current meds at current dosing  Chest pain Patient complains of exertional chest pain that began within the last month or 2. There was some left neck radiation and associated shortness of breath. It was fairly reproducible although in the last several weeks seems to have subsided. Does have positive risk factors. Unable exercise because of bilateral knee replacements. I'm going to get a pharmacologic  Myoview stress test and a 2-D echo to further evaluate him.      Runell Gess MD FACP,FACC,FAHA, [facility] 02/14/2016 10:04 AM

## 2016-02-14 NOTE — Assessment & Plan Note (Addendum)
Patient complains of exertional chest pain that began within the last month or 2. There was some left neck radiation and associated shortness of breath. It was fairly reproducible although in the last several weeks seems to have subsided. Does have positive risk factors. Unable exercise because of bilateral knee replacements. I'm going to get a pharmacologic  Myoview stress test and a 2-D echo to further evaluate him.

## 2016-02-14 NOTE — Assessment & Plan Note (Signed)
History of hypertension followed by Dr. Pamelia Hoit at [facility]. Blood pressure today is 168/80 although at home is much better than this. He is on clonidine, and labetalol. Continue current meds at current dosing

## 2016-02-14 NOTE — Patient Instructions (Signed)
Medication Instructions:  Your physician recommends that you continue on your current medications as directed. Please refer to the Current Medication list given to you today.   Labwork: none  Testing/Procedures: Your physician has requested that you have an echocardiogram. Echocardiography is a painless test that uses sound waves to create images of your heart. It provides your doctor with information about the size and shape of your heart and how well your heart's chambers and valves are working. This procedure takes approximately one hour. There are no restrictions for this procedure.  Your physician has requested that you have a lexiscan myoview. For further information please visit [email or web address] Please follow instruction sheet, as given.   Follow-Up: Your physician recommends that you schedule a follow-up appointment in: 4 weeks with Dr. Allyson Sabal - after all testing complete   Any Other Special Instructions Will Be Listed Below (If Applicable).     If you need a refill on your cardiac medications before your next appointment, please call your pharmacy.

## 2016-02-16 ENCOUNTER — Telehealth (HOSPITAL_COMMUNITY): Payer: Self-pay

## 2016-02-16 ENCOUNTER — Ambulatory Visit (INDEPENDENT_AMBULATORY_CARE_PROVIDER_SITE_OTHER): Payer: BLUE CROSS/BLUE SHIELD | Admitting: Internal Medicine

## 2016-02-16 ENCOUNTER — Encounter: Payer: Self-pay | Admitting: Internal Medicine

## 2016-02-16 VITALS — BP 136/78 | HR 58 | Ht 70.0 in | Wt 297.8 lb

## 2016-02-16 DIAGNOSIS — G4733 Obstructive sleep apnea (adult) (pediatric): Secondary | ICD-10-CM | POA: Diagnosis not present

## 2016-02-16 NOTE — Progress Notes (Signed)
Subjective:    Patient ID: Jeremy Douglas, male    DOB: 1956/10/23, 60 y.o.   MRN: [id number]  HPI 06/12/11- 10 yo smoker followed for OSA, complicated by HBP.  Last here 11/26/08-  Note reviewed. We had changed his machine to BiPap 20/15. His old machine shorted out. Couldn't sleep at all without it. Now using a temporary loaner CPAP machine at 15 which isn't as good. Likes a "rim" gasket that improves seal. He discussed this with his orthopedic office pending knee surgery next week. We discussed CPAP vs BiPAP and also surgery issues.   11/28/12- 34 yo smoker followed for OSA, complicated by HBP.  FOLLOWS FOR: having trouble with hypertension; Wears BiPAP/Autopap 20/17 every night and during naps- for about 7-8 hours; Changed pressure to 16.8/20 from 15/20. Can't sleep without his BiPAP. Download indicates AutoPap is holding a pressure near 18 for AHI 0. He has a fullface mask with a special liner he found that improves leak control. Still waking several times at night briefly but feels rested. He now gets most of his supplies online. Had flu vaccine. He is being [facility] to [facility] for [facility].  01/05/14- 75 yo former smoker followed for OSA, complicated by HBP. FOLLOWS FOR: continues to wear   BiPAP/Autopap 20/17  every night and with naps; no DME company-buys supplies through [email or web address] on web. He says he is comfortable with his BiPAP and uses it all night every night. Has 2 machines. We discussed maintenance. Uses on-line supply company. He remains off cigarettes and denies cough or acute illness.  01/06/15-58 yo former smoker followed for OSA, complicated by HBP. FOLLOWS FOR: Wears PAP every night; gets all supplies online and does not deal with [employer] at all. Not clear if he is on CPAP Autoset range 15-20, or on BIPAP I 20/ E 15. We will try to read his card. He has not been able to lose significant weight.  02/16/2016-60 year old male former smoker followed for OSA, complicated by  HBP, obesity CPAP auto around 20/17, was [employer], but gets supplies on line FOLLOWS FOR: Pt has not been with local DME for a while-did not like [employer](was changed to them due to Insurance);gets supplies online. Pt wears CPAP every night 8-9 hours. He brings a Nurse, adult from 2015 documenting excellent compliance with auto pressure range 16-20 and excellent control. His card is full but he says that he has a device to a race it so he can bring and up-to-date one next visit. He continues to get his supplies on line and says he wears CPAP anytime he sleeps including naps. Gained weight after knee replacement.  ROS-see HPI Constitutional:   No-   weight loss, night sweats, fevers, chills, fatigue, lassitude. HEENT:   No-  headaches, difficulty swallowing, tooth/dental problems, sore throat,       No-  sneezing, itching, ear ache, nasal congestion, post nasal drip,  CV:  No-   chest pain, orthopnea, PND, swelling in lower extremities, anasarca,  dizziness, palpitations Resp: No-   shortness of breath with exertion or at rest.              No-   productive cough,  No non-productive cough,  No- coughing up of blood.              No-   change in color of mucus.  No- wheezing.   Skin: No-   rash or lesions. GI:  No-   heartburn, indigestion, abdominal pain, nausea, vomiting,  GU: .  MS:  No-   joint pain or swelling.   Neuro-     nothing unusual Psych:  No- change in mood or affect. No depression or anxiety.  No memory loss.    Objective:  OBJ- Physical Exam General- Alert, Oriented, Affect-appropriate, Distress- none acute. + Obese Skin- rash-none, lesions- none, excoriation- none Lymphadenopathy- none Head- atraumatic            Eyes- Gross vision intact, PERRLA, conjunctivae and secretions clear            Ears- Hearing, canals-normal            Nose- Clear, no-Septal dev, mucus, polyps, erosion, perforation             Throat- Mallampati III , mucosa clear , drainage- none, tonsils-  atrophic Neck- flexible , trachea midline, no stridor , thyroid nl, carotid no bruit Chest - symmetrical excursion , unlabored           Heart/CV- RRR , no murmur , no gallop  , no rub, nl s1 s2                           - JVD- none , edema- none, stasis changes- none, varices- none           Lung- clear to P&A, wheeze- none, cough- none , dullness-none, rub- none           Chest wall-  Abd-  Br/ Gen/ Rectal- Not done, not indicated Extrem- cyanosis- none, clubbing, none, atrophy- none, strength- nl Neuro- grossly intact to observation  Assessment & Plan:

## 2016-02-16 NOTE — Assessment & Plan Note (Signed)
Significant obesity which we discussed. He needs to get serious about this.

## 2016-02-16 NOTE — Telephone Encounter (Signed)
Encounter complete. 

## 2016-02-16 NOTE — Assessment & Plan Note (Signed)
He is not using a DME company now and gets supplies on line. His card does indicate excellent compliance and control from 2015 with auto pressure range 16-20. Plan-he will scrub his card for up-to-date download next visit. No changes necessary.

## 2016-02-16 NOTE — Patient Instructions (Signed)
We can continue CPAP auto 16-20 You can erase your SD card so we have current documentation next year  It will help you to fight your weight back down.  Hopefully you will get a good report from your cardiology test  Please call if we can help

## 2016-02-17 ENCOUNTER — Other Ambulatory Visit: Payer: Self-pay

## 2016-02-17 DIAGNOSIS — R079 Chest pain, unspecified: Secondary | ICD-10-CM

## 2016-02-21 ENCOUNTER — Ambulatory Visit (HOSPITAL_COMMUNITY)
Admission: RE | Admit: 2016-02-21 | Discharge: 2016-02-21 | Disposition: A | Discharge status: Home / Self Care | Payer: BLUE CROSS/BLUE SHIELD | Source: Ambulatory Visit | Attending: Urology | Admitting: Urology

## 2016-02-21 ENCOUNTER — Inpatient Hospital Stay (HOSPITAL_COMMUNITY): Admission: RE | Admit: 2016-02-21 | Payer: BLUE CROSS/BLUE SHIELD | Source: Ambulatory Visit

## 2016-02-21 DIAGNOSIS — R079 Chest pain, unspecified: Secondary | ICD-10-CM | POA: Diagnosis present

## 2016-02-21 DIAGNOSIS — I313 Pericardial effusion (noninflammatory): Secondary | ICD-10-CM | POA: Diagnosis not present

## 2016-02-21 DIAGNOSIS — Z8249 Family history of ischemic heart disease and other diseases of the circulatory system: Secondary | ICD-10-CM | POA: Insufficient documentation

## 2016-02-21 DIAGNOSIS — I119 Hypertensive heart disease without heart failure: Secondary | ICD-10-CM | POA: Insufficient documentation

## 2016-02-21 DIAGNOSIS — I059 Rheumatic mitral valve disease, unspecified: Secondary | ICD-10-CM | POA: Diagnosis not present

## 2016-02-22 ENCOUNTER — Ambulatory Visit (HOSPITAL_COMMUNITY): Payer: BLUE CROSS/BLUE SHIELD

## 2016-03-01 ENCOUNTER — Ambulatory Visit (INDEPENDENT_AMBULATORY_CARE_PROVIDER_SITE_OTHER): Payer: BLUE CROSS/BLUE SHIELD

## 2016-03-01 DIAGNOSIS — R079 Chest pain, unspecified: Secondary | ICD-10-CM | POA: Diagnosis not present

## 2016-03-01 LAB — EXERCISE TOLERANCE TEST
CHL CUP STRESS STAGE 1 DBP: 70 mmHg
CHL CUP STRESS STAGE 1 GRADE: 0 %
CHL CUP STRESS STAGE 1 HR: 68 {beats}/min
CHL CUP STRESS STAGE 1 SBP: 193 mmHg
CHL CUP STRESS STAGE 1 SPEED: 0 mph
CHL CUP STRESS STAGE 2 HR: 71 {beats}/min
CHL CUP STRESS STAGE 3 HR: 71 {beats}/min
CHL CUP STRESS STAGE 5 HR: 99 {beats}/min
CHL CUP STRESS STAGE 6 GRADE: 0 %
CHL CUP STRESS STAGE 6 HR: 83 {beats}/min
CHL CUP STRESS STAGE 6 SPEED: 0 mph
CSEPEDS: 59 s
CSEPEW: 4.6 METS
Exercise duration (min): 2 min
MPHR: 162 {beats}/min
Peak HR: 100 {beats}/min
Percent HR: 64 %
Percent of predicted max HR: 61 %
RPE: 15
Rest HR: 64 {beats}/min
Stage 2 Grade: 0 %
Stage 2 Speed: 1 mph
Stage 3 Grade: 0 %
Stage 3 Speed: 1 mph
Stage 4 Grade: 10 %
Stage 4 HR: 100 {beats}/min
Stage 4 Speed: 1.7 mph
Stage 5 DBP: 59 mmHg
Stage 5 Grade: 0 %
Stage 5 SBP: 215 mmHg
Stage 5 Speed: 0 mph

## 2016-03-20 ENCOUNTER — Ambulatory Visit: Payer: BLUE CROSS/BLUE SHIELD | Admitting: Cardiovascular Disease

## 2016-03-22 ENCOUNTER — Encounter: Payer: Self-pay | Admitting: *Deleted

## 2016-04-27 ENCOUNTER — Telehealth: Payer: Self-pay

## 2016-04-27 NOTE — Telephone Encounter (Signed)
-----   Message from Runell GessJonathan J Berry, MD sent at 03/01/2016  4:54 PM EST ----- Negative inadequate response exercise stress test secondary to inability to restart heart rate. Please schedule a pharmacologic Myoview stress test.

## 2016-04-27 NOTE — Telephone Encounter (Signed)
Notes Recorded by Theressa StampsKimberly N Skylan Gift, RN on 04/12/2016 at 1:27 PM Sent letter to call office to discuss test results Notes Recorded by Freddi Starrebra W Mathis, RN on 03/27/2016 at 11:48 AM Left message for pt to call Notes Recorded by Theressa StampsKimberly N Ellena Kamen, RN on 03/02/2016 at 4:10 PM Left message to call back  Notes Recorded by Runell GessJonathan J Berry, MD on 03/01/2016 at 4:54 PM Negative inadequate response exercise stress test secondary to inability to restart heart rate. Please schedule a pharmacologic Myoview stress test.

## 2016-06-11 DIAGNOSIS — M25561 Pain in right knee: Secondary | ICD-10-CM | POA: Diagnosis not present

## 2016-06-19 DIAGNOSIS — Z79899 Other long term (current) drug therapy: Secondary | ICD-10-CM | POA: Diagnosis not present

## 2016-06-19 DIAGNOSIS — G894 Chronic pain syndrome: Secondary | ICD-10-CM | POA: Diagnosis not present

## 2016-06-19 DIAGNOSIS — M25569 Pain in unspecified knee: Secondary | ICD-10-CM | POA: Diagnosis not present

## 2016-06-19 DIAGNOSIS — Z79891 Long term (current) use of opiate analgesic: Secondary | ICD-10-CM | POA: Diagnosis not present

## 2016-06-19 DIAGNOSIS — E669 Obesity, unspecified: Secondary | ICD-10-CM | POA: Diagnosis not present

## 2016-07-11 DIAGNOSIS — M25561 Pain in right knee: Secondary | ICD-10-CM | POA: Diagnosis not present

## 2016-07-17 DIAGNOSIS — G894 Chronic pain syndrome: Secondary | ICD-10-CM | POA: Diagnosis not present

## 2016-07-17 DIAGNOSIS — Z79899 Other long term (current) drug therapy: Secondary | ICD-10-CM | POA: Diagnosis not present

## 2016-07-17 DIAGNOSIS — Z79891 Long term (current) use of opiate analgesic: Secondary | ICD-10-CM | POA: Diagnosis not present

## 2016-07-17 DIAGNOSIS — E669 Obesity, unspecified: Secondary | ICD-10-CM | POA: Diagnosis not present

## 2016-07-17 DIAGNOSIS — M25569 Pain in unspecified knee: Secondary | ICD-10-CM | POA: Diagnosis not present

## 2016-08-11 DIAGNOSIS — M25561 Pain in right knee: Secondary | ICD-10-CM | POA: Diagnosis not present

## 2016-08-14 DIAGNOSIS — Z79899 Other long term (current) drug therapy: Secondary | ICD-10-CM | POA: Diagnosis not present

## 2016-08-14 DIAGNOSIS — G894 Chronic pain syndrome: Secondary | ICD-10-CM | POA: Diagnosis not present

## 2016-08-14 DIAGNOSIS — M25569 Pain in unspecified knee: Secondary | ICD-10-CM | POA: Diagnosis not present

## 2016-08-14 DIAGNOSIS — E669 Obesity, unspecified: Secondary | ICD-10-CM | POA: Diagnosis not present

## 2016-08-14 DIAGNOSIS — Z79891 Long term (current) use of opiate analgesic: Secondary | ICD-10-CM | POA: Diagnosis not present

## 2016-09-11 DIAGNOSIS — M25561 Pain in right knee: Secondary | ICD-10-CM | POA: Diagnosis not present

## 2016-09-11 DIAGNOSIS — Z79891 Long term (current) use of opiate analgesic: Secondary | ICD-10-CM | POA: Diagnosis not present

## 2016-09-11 DIAGNOSIS — M25569 Pain in unspecified knee: Secondary | ICD-10-CM | POA: Diagnosis not present

## 2016-09-11 DIAGNOSIS — Z79899 Other long term (current) drug therapy: Secondary | ICD-10-CM | POA: Diagnosis not present

## 2016-09-11 DIAGNOSIS — G894 Chronic pain syndrome: Secondary | ICD-10-CM | POA: Diagnosis not present

## 2016-10-09 DIAGNOSIS — Z79891 Long term (current) use of opiate analgesic: Secondary | ICD-10-CM | POA: Diagnosis not present

## 2016-10-09 DIAGNOSIS — G894 Chronic pain syndrome: Secondary | ICD-10-CM | POA: Diagnosis not present

## 2016-10-09 DIAGNOSIS — Z79899 Other long term (current) drug therapy: Secondary | ICD-10-CM | POA: Diagnosis not present

## 2016-10-09 DIAGNOSIS — M25569 Pain in unspecified knee: Secondary | ICD-10-CM | POA: Diagnosis not present

## 2016-10-09 DIAGNOSIS — E669 Obesity, unspecified: Secondary | ICD-10-CM | POA: Diagnosis not present

## 2016-11-06 DIAGNOSIS — Z79891 Long term (current) use of opiate analgesic: Secondary | ICD-10-CM | POA: Diagnosis not present

## 2016-11-06 DIAGNOSIS — G894 Chronic pain syndrome: Secondary | ICD-10-CM | POA: Diagnosis not present

## 2016-11-06 DIAGNOSIS — Z79899 Other long term (current) drug therapy: Secondary | ICD-10-CM | POA: Diagnosis not present

## 2016-11-06 DIAGNOSIS — E669 Obesity, unspecified: Secondary | ICD-10-CM | POA: Diagnosis not present

## 2016-11-06 DIAGNOSIS — M25569 Pain in unspecified knee: Secondary | ICD-10-CM | POA: Diagnosis not present

## 2016-12-04 DIAGNOSIS — Z79891 Long term (current) use of opiate analgesic: Secondary | ICD-10-CM | POA: Diagnosis not present

## 2016-12-04 DIAGNOSIS — G894 Chronic pain syndrome: Secondary | ICD-10-CM | POA: Diagnosis not present

## 2016-12-04 DIAGNOSIS — M25569 Pain in unspecified knee: Secondary | ICD-10-CM | POA: Diagnosis not present

## 2016-12-04 DIAGNOSIS — Z79899 Other long term (current) drug therapy: Secondary | ICD-10-CM | POA: Diagnosis not present

## 2017-01-01 DIAGNOSIS — G894 Chronic pain syndrome: Secondary | ICD-10-CM | POA: Diagnosis not present

## 2017-01-01 DIAGNOSIS — M25569 Pain in unspecified knee: Secondary | ICD-10-CM | POA: Diagnosis not present

## 2017-01-01 DIAGNOSIS — Z79899 Other long term (current) drug therapy: Secondary | ICD-10-CM | POA: Diagnosis not present

## 2017-01-01 DIAGNOSIS — E669 Obesity, unspecified: Secondary | ICD-10-CM | POA: Diagnosis not present

## 2017-01-01 DIAGNOSIS — Z79891 Long term (current) use of opiate analgesic: Secondary | ICD-10-CM | POA: Diagnosis not present

## 2017-01-29 DIAGNOSIS — G894 Chronic pain syndrome: Secondary | ICD-10-CM | POA: Diagnosis not present

## 2017-01-29 DIAGNOSIS — Z79899 Other long term (current) drug therapy: Secondary | ICD-10-CM | POA: Diagnosis not present

## 2017-01-29 DIAGNOSIS — M25569 Pain in unspecified knee: Secondary | ICD-10-CM | POA: Diagnosis not present

## 2017-01-29 DIAGNOSIS — Z79891 Long term (current) use of opiate analgesic: Secondary | ICD-10-CM | POA: Diagnosis not present

## 2017-01-29 DIAGNOSIS — E669 Obesity, unspecified: Secondary | ICD-10-CM | POA: Diagnosis not present

## 2017-02-26 DIAGNOSIS — Z79899 Other long term (current) drug therapy: Secondary | ICD-10-CM | POA: Diagnosis not present

## 2017-02-26 DIAGNOSIS — M25569 Pain in unspecified knee: Secondary | ICD-10-CM | POA: Diagnosis not present

## 2017-02-26 DIAGNOSIS — G894 Chronic pain syndrome: Secondary | ICD-10-CM | POA: Diagnosis not present

## 2017-02-26 DIAGNOSIS — Z79891 Long term (current) use of opiate analgesic: Secondary | ICD-10-CM | POA: Diagnosis not present

## 2017-02-26 DIAGNOSIS — E669 Obesity, unspecified: Secondary | ICD-10-CM | POA: Diagnosis not present

## 2017-03-26 DIAGNOSIS — G894 Chronic pain syndrome: Secondary | ICD-10-CM | POA: Diagnosis not present

## 2017-03-26 DIAGNOSIS — M25569 Pain in unspecified knee: Secondary | ICD-10-CM | POA: Diagnosis not present

## 2017-03-26 DIAGNOSIS — Z79899 Other long term (current) drug therapy: Secondary | ICD-10-CM | POA: Diagnosis not present

## 2017-03-26 DIAGNOSIS — Z79891 Long term (current) use of opiate analgesic: Secondary | ICD-10-CM | POA: Diagnosis not present

## 2017-03-26 DIAGNOSIS — E669 Obesity, unspecified: Secondary | ICD-10-CM | POA: Diagnosis not present

## 2017-04-23 DIAGNOSIS — E669 Obesity, unspecified: Secondary | ICD-10-CM | POA: Diagnosis not present

## 2017-04-23 DIAGNOSIS — Z79899 Other long term (current) drug therapy: Secondary | ICD-10-CM | POA: Diagnosis not present

## 2017-04-23 DIAGNOSIS — Z79891 Long term (current) use of opiate analgesic: Secondary | ICD-10-CM | POA: Diagnosis not present

## 2017-04-23 DIAGNOSIS — G894 Chronic pain syndrome: Secondary | ICD-10-CM | POA: Diagnosis not present

## 2017-04-23 DIAGNOSIS — M25569 Pain in unspecified knee: Secondary | ICD-10-CM | POA: Diagnosis not present

## 2017-05-21 DIAGNOSIS — Z79891 Long term (current) use of opiate analgesic: Secondary | ICD-10-CM | POA: Diagnosis not present

## 2017-05-21 DIAGNOSIS — M25569 Pain in unspecified knee: Secondary | ICD-10-CM | POA: Diagnosis not present

## 2017-05-21 DIAGNOSIS — Z79899 Other long term (current) drug therapy: Secondary | ICD-10-CM | POA: Diagnosis not present

## 2017-05-21 DIAGNOSIS — E669 Obesity, unspecified: Secondary | ICD-10-CM | POA: Diagnosis not present

## 2017-05-21 DIAGNOSIS — G894 Chronic pain syndrome: Secondary | ICD-10-CM | POA: Diagnosis not present

## 2017-06-18 DIAGNOSIS — G894 Chronic pain syndrome: Secondary | ICD-10-CM | POA: Diagnosis not present

## 2017-06-18 DIAGNOSIS — E669 Obesity, unspecified: Secondary | ICD-10-CM | POA: Diagnosis not present

## 2017-06-18 DIAGNOSIS — M25569 Pain in unspecified knee: Secondary | ICD-10-CM | POA: Diagnosis not present

## 2017-07-16 DIAGNOSIS — E669 Obesity, unspecified: Secondary | ICD-10-CM | POA: Diagnosis not present

## 2017-07-16 DIAGNOSIS — M25569 Pain in unspecified knee: Secondary | ICD-10-CM | POA: Diagnosis not present

## 2017-07-16 DIAGNOSIS — G894 Chronic pain syndrome: Secondary | ICD-10-CM | POA: Diagnosis not present

## 2017-08-13 DIAGNOSIS — M25569 Pain in unspecified knee: Secondary | ICD-10-CM | POA: Diagnosis not present

## 2017-08-13 DIAGNOSIS — Z79891 Long term (current) use of opiate analgesic: Secondary | ICD-10-CM | POA: Diagnosis not present

## 2017-08-13 DIAGNOSIS — G894 Chronic pain syndrome: Secondary | ICD-10-CM | POA: Diagnosis not present

## 2017-08-13 DIAGNOSIS — E669 Obesity, unspecified: Secondary | ICD-10-CM | POA: Diagnosis not present

## 2017-08-13 DIAGNOSIS — Z79899 Other long term (current) drug therapy: Secondary | ICD-10-CM | POA: Diagnosis not present

## 2017-08-29 DIAGNOSIS — Z79899 Other long term (current) drug therapy: Secondary | ICD-10-CM | POA: Diagnosis not present

## 2017-08-29 DIAGNOSIS — M25569 Pain in unspecified knee: Secondary | ICD-10-CM | POA: Diagnosis not present

## 2017-08-29 DIAGNOSIS — E669 Obesity, unspecified: Secondary | ICD-10-CM | POA: Diagnosis not present

## 2017-08-29 DIAGNOSIS — Z79891 Long term (current) use of opiate analgesic: Secondary | ICD-10-CM | POA: Diagnosis not present

## 2017-08-29 DIAGNOSIS — G894 Chronic pain syndrome: Secondary | ICD-10-CM | POA: Diagnosis not present

## 2017-09-10 DIAGNOSIS — G894 Chronic pain syndrome: Secondary | ICD-10-CM | POA: Diagnosis not present

## 2017-09-10 DIAGNOSIS — Z79899 Other long term (current) drug therapy: Secondary | ICD-10-CM | POA: Diagnosis not present

## 2017-09-10 DIAGNOSIS — M25569 Pain in unspecified knee: Secondary | ICD-10-CM | POA: Diagnosis not present

## 2017-09-10 DIAGNOSIS — E669 Obesity, unspecified: Secondary | ICD-10-CM | POA: Diagnosis not present

## 2017-09-10 DIAGNOSIS — Z79891 Long term (current) use of opiate analgesic: Secondary | ICD-10-CM | POA: Diagnosis not present

## 2017-09-23 DIAGNOSIS — I10 Essential (primary) hypertension: Secondary | ICD-10-CM | POA: Diagnosis not present

## 2017-10-08 DIAGNOSIS — Z79899 Other long term (current) drug therapy: Secondary | ICD-10-CM | POA: Diagnosis not present

## 2017-10-08 DIAGNOSIS — G894 Chronic pain syndrome: Secondary | ICD-10-CM | POA: Diagnosis not present

## 2017-10-08 DIAGNOSIS — Z79891 Long term (current) use of opiate analgesic: Secondary | ICD-10-CM | POA: Diagnosis not present

## 2017-10-08 DIAGNOSIS — M25569 Pain in unspecified knee: Secondary | ICD-10-CM | POA: Diagnosis not present

## 2017-10-08 DIAGNOSIS — M1710 Unilateral primary osteoarthritis, unspecified knee: Secondary | ICD-10-CM | POA: Diagnosis not present

## 2017-11-05 DIAGNOSIS — G894 Chronic pain syndrome: Secondary | ICD-10-CM | POA: Diagnosis not present

## 2017-11-05 DIAGNOSIS — E669 Obesity, unspecified: Secondary | ICD-10-CM | POA: Diagnosis not present

## 2017-11-05 DIAGNOSIS — M25569 Pain in unspecified knee: Secondary | ICD-10-CM | POA: Diagnosis not present

## 2017-11-05 DIAGNOSIS — Z79891 Long term (current) use of opiate analgesic: Secondary | ICD-10-CM | POA: Diagnosis not present

## 2017-11-05 DIAGNOSIS — M1710 Unilateral primary osteoarthritis, unspecified knee: Secondary | ICD-10-CM | POA: Diagnosis not present

## 2017-11-05 DIAGNOSIS — Z79899 Other long term (current) drug therapy: Secondary | ICD-10-CM | POA: Diagnosis not present

## 2017-12-03 DIAGNOSIS — Z79899 Other long term (current) drug therapy: Secondary | ICD-10-CM | POA: Diagnosis not present

## 2017-12-03 DIAGNOSIS — G894 Chronic pain syndrome: Secondary | ICD-10-CM | POA: Diagnosis not present

## 2017-12-03 DIAGNOSIS — M25569 Pain in unspecified knee: Secondary | ICD-10-CM | POA: Diagnosis not present

## 2017-12-03 DIAGNOSIS — Z79891 Long term (current) use of opiate analgesic: Secondary | ICD-10-CM | POA: Diagnosis not present

## 2017-12-03 DIAGNOSIS — E669 Obesity, unspecified: Secondary | ICD-10-CM | POA: Diagnosis not present

## 2017-12-03 DIAGNOSIS — M1710 Unilateral primary osteoarthritis, unspecified knee: Secondary | ICD-10-CM | POA: Diagnosis not present

## 2017-12-31 DIAGNOSIS — Z79891 Long term (current) use of opiate analgesic: Secondary | ICD-10-CM | POA: Diagnosis not present

## 2017-12-31 DIAGNOSIS — G894 Chronic pain syndrome: Secondary | ICD-10-CM | POA: Diagnosis not present

## 2017-12-31 DIAGNOSIS — Z79899 Other long term (current) drug therapy: Secondary | ICD-10-CM | POA: Diagnosis not present

## 2017-12-31 DIAGNOSIS — E669 Obesity, unspecified: Secondary | ICD-10-CM | POA: Diagnosis not present

## 2017-12-31 DIAGNOSIS — M1710 Unilateral primary osteoarthritis, unspecified knee: Secondary | ICD-10-CM | POA: Diagnosis not present

## 2017-12-31 DIAGNOSIS — M25569 Pain in unspecified knee: Secondary | ICD-10-CM | POA: Diagnosis not present

## 2018-01-28 DIAGNOSIS — Z79899 Other long term (current) drug therapy: Secondary | ICD-10-CM | POA: Diagnosis not present

## 2018-01-28 DIAGNOSIS — M25569 Pain in unspecified knee: Secondary | ICD-10-CM | POA: Diagnosis not present

## 2018-01-28 DIAGNOSIS — Z79891 Long term (current) use of opiate analgesic: Secondary | ICD-10-CM | POA: Diagnosis not present

## 2018-01-28 DIAGNOSIS — E669 Obesity, unspecified: Secondary | ICD-10-CM | POA: Diagnosis not present

## 2018-01-28 DIAGNOSIS — G894 Chronic pain syndrome: Secondary | ICD-10-CM | POA: Diagnosis not present

## 2018-01-28 DIAGNOSIS — M1710 Unilateral primary osteoarthritis, unspecified knee: Secondary | ICD-10-CM | POA: Diagnosis not present

## 2018-02-17 DIAGNOSIS — M545 Low back pain: Secondary | ICD-10-CM | POA: Diagnosis not present

## 2018-02-17 DIAGNOSIS — M542 Cervicalgia: Secondary | ICD-10-CM | POA: Diagnosis not present

## 2018-02-21 DIAGNOSIS — I2699 Other pulmonary embolism without acute cor pulmonale: Secondary | ICD-10-CM

## 2018-02-21 HISTORY — DX: Other pulmonary embolism without acute cor pulmonale: I26.99

## 2018-02-24 DIAGNOSIS — M545 Low back pain: Secondary | ICD-10-CM | POA: Diagnosis not present

## 2018-02-24 DIAGNOSIS — S39012D Strain of muscle, fascia and tendon of lower back, subsequent encounter: Secondary | ICD-10-CM | POA: Diagnosis not present

## 2018-02-25 DIAGNOSIS — M25569 Pain in unspecified knee: Secondary | ICD-10-CM | POA: Diagnosis not present

## 2018-02-25 DIAGNOSIS — E669 Obesity, unspecified: Secondary | ICD-10-CM | POA: Diagnosis not present

## 2018-02-25 DIAGNOSIS — M1710 Unilateral primary osteoarthritis, unspecified knee: Secondary | ICD-10-CM | POA: Diagnosis not present

## 2018-02-25 DIAGNOSIS — G894 Chronic pain syndrome: Secondary | ICD-10-CM | POA: Diagnosis not present

## 2018-03-13 ENCOUNTER — Ambulatory Visit: Payer: BLUE CROSS/BLUE SHIELD | Admitting: Internal Medicine

## 2018-03-13 ENCOUNTER — Ambulatory Visit (INDEPENDENT_AMBULATORY_CARE_PROVIDER_SITE_OTHER)
Admission: RE | Admit: 2018-03-13 | Discharge: 2018-03-13 | Disposition: A | Discharge status: Home / Self Care | Payer: BLUE CROSS/BLUE SHIELD | Source: Ambulatory Visit | Attending: Internal Medicine | Admitting: Internal Medicine

## 2018-03-13 ENCOUNTER — Other Ambulatory Visit (INDEPENDENT_AMBULATORY_CARE_PROVIDER_SITE_OTHER): Payer: BLUE CROSS/BLUE SHIELD

## 2018-03-13 ENCOUNTER — Encounter: Payer: Self-pay | Admitting: Internal Medicine

## 2018-03-13 VITALS — BP 140/100 | HR 67 | Ht 71.0 in | Wt 284.0 lb

## 2018-03-13 DIAGNOSIS — R0602 Shortness of breath: Secondary | ICD-10-CM

## 2018-03-13 DIAGNOSIS — G4733 Obstructive sleep apnea (adult) (pediatric): Secondary | ICD-10-CM

## 2018-03-13 LAB — BASIC METABOLIC PANEL
BUN: 15 mg/dL (ref 6–23)
CALCIUM: 10.2 mg/dL (ref 8.4–10.5)
CO2: 24 mEq/L (ref 19–32)
Chloride: 109 mEq/L (ref 96–112)
Creatinine, Ser: 1.31 mg/dL (ref 0.40–1.50)
GFR: 59.22 mL/min — AB (ref >60.00)
GLUCOSE: 99 mg/dL (ref 70–99)
Potassium: 5 mEq/L (ref 3.5–5.1)
SODIUM: 142 meq/L (ref 135–145)

## 2018-03-13 LAB — CBC WITH DIFFERENTIAL/PLATELET
BASOS ABS: 0 10*3/uL (ref 0.0–0.1)
Basophils Relative: 0.9 % (ref 0.0–3.0)
Eosinophils Absolute: 0.2 10*3/uL (ref 0.0–0.7)
Eosinophils Relative: 3 % (ref 0.0–5.0)
HCT: 33.3 % — ABNORMAL LOW (ref 39.0–52.0)
HEMOGLOBIN: 10.2 g/dL — AB (ref 13.0–17.0)
LYMPHS ABS: 2.4 10*3/uL (ref 0.7–4.0)
Lymphocytes Relative: 46.5 % — ABNORMAL HIGH (ref 12.0–46.0)
MCHC: 30.6 g/dL (ref 30.0–36.0)
MCV: 64.7 fl — ABNORMAL LOW (ref 78.0–100.0)
MONO ABS: 0.6 10*3/uL (ref 0.1–1.0)
MONOS PCT: 11.6 % (ref 3.0–12.0)
NEUTROS PCT: 38 % — AB (ref 43.0–77.0)
Neutro Abs: 2 10*3/uL (ref 1.4–7.7)
Platelets: 280 10*3/uL (ref 150.0–400.0)
RBC: 5.14 Mil/uL (ref 4.22–5.81)
RDW: 17.4 % — ABNORMAL HIGH (ref 11.5–15.5)
WBC: 5.2 10*3/uL (ref 4.0–10.5)

## 2018-03-13 LAB — BRAIN NATRIURETIC PEPTIDE: Pro B Natriuretic peptide (BNP): 171 pg/mL — ABNORMAL HIGH (ref 0.0–100.0)

## 2018-03-13 MED ORDER — LEVALBUTEROL HCL 0.63 MG/3ML IN NEBU
0.6300 mg | INHALATION_SOLUTION | Freq: Once | RESPIRATORY_TRACT | Status: AC
  Administered 2018-03-13: 0.63 mg via RESPIRATORY_TRACT

## 2018-03-13 MED ORDER — METHYLPREDNISOLONE ACETATE 80 MG/ML IJ SUSP
80.0000 mg | Freq: Once | INTRAMUSCULAR | Status: AC
  Administered 2018-03-13: 80 mg via INTRAMUSCULAR

## 2018-03-13 NOTE — Patient Instructions (Signed)
Order- lab- CBC w diff, BMET, D-dimer, BNP      Dx dyspnea on exertion, peripheral edema  Order- Office spirometry     Order- CXR  Order- neb xop 0.63,              Depo 80

## 2018-03-13 NOTE — Progress Notes (Signed)
Subjective:    Patient ID: Jeremy Douglas, male    DOB: 1956-11-26, 62 y.o.   MRN: [id number]  HPI male former smoker followed for OSA, complicated by HBP, obesity NPSG 10/06/08--  AHI 109.3/ hr, desat to 60%, body weight 311 lbs Office Spirometry 03/13/18-moderate obstructive airways disease with reduced exhaled volume.  FVC 3.38/68%, FEV1 2.40/64%, ratio 0.79, FEF 25-75% 1.57/51% ---------------------------------------------------------------------------  02/16/2016-62 year old male former smoker followed for OSA, complicated by HBP, obesity CPAP auto around 20/17, was [employer], but gets supplies on line FOLLOWS FOR: Pt has not been with local DME for a while-did not like [employer](was changed to them due to Insurance);gets supplies online. Pt wears CPAP every night 8-9 hours. He brings a Nurse, adult from 2015 documenting excellent compliance with auto pressure range 16-20 and excellent control. His card is full but he says that he has a device to a race it so he can bring and up-to-date one next visit. He continues to get his supplies on line and says he wears CPAP anytime he sleeps including naps. Gained weight after knee replacement.  03/13/18- 62 year old male former smoker followed for OSA, COPD complicated by HBP, obesity CPAP auto  16-20, was [employer], but gets supplies on line ----SOB comes and goes with activity  EF 55-60% on echocardiogram 02/21/16, LAE He relates shortness of breath since he fell on stairs at Christmas time hurting his back.  Receiving physical therapy twice weekly for back spasm.  Percocet for knee pains after bilateral knee replacements.  Percocet blunts his shortness of breath.  Wheezes a little.  Denies cough or chest pain.  Legs have been swelling. Wears CPAP 16-20 anytime he lies down.  Nocturia x2 or 3. Office Spirometry 03/13/18-moderate obstructive airways disease with reduced exhaled volume.  FVC 3.38/68%, FEV1 2.40/64%, ratio 0.79, FEF 25-75% 1.57/51%  ROS-see  HPI + = positive Constitutional:   No-   weight loss, night sweats, fevers, chills, fatigue, lassitude. HEENT:   No-  headaches, difficulty swallowing, tooth/dental problems, sore throat,       No-  sneezing, itching, ear ache, nasal congestion, post nasal drip,  CV:  No-   chest pain, orthopnea, PND, swelling in lower extremities, anasarca,  dizziness, palpitations Resp: +  shortness of breath with exertion or at rest.              No-   productive cough,  No non-productive cough,  No- coughing up of blood.              No-   change in color of mucus.  No- wheezing.   Skin: No-   rash or lesions. GI:  No-   heartburn, indigestion, abdominal pain, nausea, vomiting,  GU: . MS:  + joint pain or swelling.   Neuro-     nothing unusual Psych:  No- change in mood or affect. No depression or anxiety.  No memory loss.    Objective:  OBJ- Physical Exam General- Alert, Oriented, Affect-appropriate, Distress- none acute. + Overweight Skin- rash-none, lesions- none, excoriation- none Lymphadenopathy- none Head- atraumatic            Eyes- Gross vision intact, PERRLA, conjunctivae and secretions clear            Ears- Hearing, canals-normal            Nose- Clear, no-Septal dev, mucus, polyps, erosion, perforation             Throat- Mallampati III , mucosa clear , drainage- none, tonsils- atrophic  Neck- flexible , trachea midline, no stridor , thyroid nl, carotid no bruit Chest - symmetrical excursion , unlabored           Heart/CV- RRR , no murmur , no gallop  , no rub, nl s1 s2                           - JVD+ 1-2 , edema +1, stasis changes- none, varices- none           Lung- clear to P&A, wheeze- none, cough- none , dullness-none, rub- none           Chest wall-  Abd-  Br/ Gen/ Rectal- Not done, not indicated Extrem- cyanosis- none, clubbing, none, atrophy- none, strength- nl Neuro- grossly intact to observation  Assessment & Plan:

## 2018-03-14 ENCOUNTER — Other Ambulatory Visit: Payer: Self-pay | Admitting: Internal Medicine

## 2018-03-14 ENCOUNTER — Other Ambulatory Visit: Payer: Self-pay

## 2018-03-14 ENCOUNTER — Observation Stay (HOSPITAL_COMMUNITY)
Admission: EM | Admit: 2018-03-14 | Discharge: 2018-03-15 | DRG: 176 | Disposition: A | Discharge status: Home / Self Care | Payer: BLUE CROSS/BLUE SHIELD | Attending: Internal Medicine | Admitting: Internal Medicine

## 2018-03-14 ENCOUNTER — Telehealth: Payer: Self-pay | Admitting: Internal Medicine

## 2018-03-14 ENCOUNTER — Encounter (HOSPITAL_COMMUNITY): Payer: Self-pay

## 2018-03-14 ENCOUNTER — Ambulatory Visit (HOSPITAL_COMMUNITY)
Admission: RE | Admit: 2018-03-14 | Discharge: 2018-03-14 | Disposition: A | Discharge status: Home / Self Care | Payer: BLUE CROSS/BLUE SHIELD | Source: Ambulatory Visit | Attending: Adult Health | Admitting: Adult Health

## 2018-03-14 DIAGNOSIS — R0602 Shortness of breath: Secondary | ICD-10-CM

## 2018-03-14 DIAGNOSIS — Z7982 Long term (current) use of aspirin: Secondary | ICD-10-CM

## 2018-03-14 DIAGNOSIS — J449 Chronic obstructive pulmonary disease, unspecified: Secondary | ICD-10-CM | POA: Diagnosis not present

## 2018-03-14 DIAGNOSIS — R7989 Other specified abnormal findings of blood chemistry: Secondary | ICD-10-CM

## 2018-03-14 DIAGNOSIS — Z96653 Presence of artificial knee joint, bilateral: Secondary | ICD-10-CM | POA: Diagnosis not present

## 2018-03-14 DIAGNOSIS — I160 Hypertensive urgency: Secondary | ICD-10-CM | POA: Diagnosis present

## 2018-03-14 DIAGNOSIS — G4733 Obstructive sleep apnea (adult) (pediatric): Secondary | ICD-10-CM | POA: Diagnosis present

## 2018-03-14 DIAGNOSIS — F17201 Nicotine dependence, unspecified, in remission: Secondary | ICD-10-CM | POA: Diagnosis present

## 2018-03-14 DIAGNOSIS — I2699 Other pulmonary embolism without acute cor pulmonale: Principal | ICD-10-CM | POA: Diagnosis present

## 2018-03-14 DIAGNOSIS — Z79899 Other long term (current) drug therapy: Secondary | ICD-10-CM

## 2018-03-14 DIAGNOSIS — Z888 Allergy status to other drugs, medicaments and biological substances status: Secondary | ICD-10-CM

## 2018-03-14 DIAGNOSIS — J439 Emphysema, unspecified: Secondary | ICD-10-CM | POA: Diagnosis not present

## 2018-03-14 DIAGNOSIS — I10 Essential (primary) hypertension: Secondary | ICD-10-CM | POA: Diagnosis not present

## 2018-03-14 DIAGNOSIS — D509 Iron deficiency anemia, unspecified: Secondary | ICD-10-CM | POA: Diagnosis not present

## 2018-03-14 DIAGNOSIS — Z66 Do not resuscitate: Secondary | ICD-10-CM | POA: Diagnosis not present

## 2018-03-14 DIAGNOSIS — D569 Thalassemia, unspecified: Secondary | ICD-10-CM | POA: Diagnosis present

## 2018-03-14 DIAGNOSIS — R195 Other fecal abnormalities: Secondary | ICD-10-CM | POA: Diagnosis not present

## 2018-03-14 DIAGNOSIS — Z87891 Personal history of nicotine dependence: Secondary | ICD-10-CM

## 2018-03-14 DIAGNOSIS — G8929 Other chronic pain: Secondary | ICD-10-CM

## 2018-03-14 DIAGNOSIS — I340 Nonrheumatic mitral (valve) insufficiency: Secondary | ICD-10-CM | POA: Diagnosis not present

## 2018-03-14 DIAGNOSIS — Z6838 Body mass index (BMI) 38.0-38.9, adult: Secondary | ICD-10-CM

## 2018-03-14 DIAGNOSIS — I2609 Other pulmonary embolism with acute cor pulmonale: Secondary | ICD-10-CM | POA: Diagnosis not present

## 2018-03-14 LAB — CBC WITH DIFFERENTIAL/PLATELET
BASOS PCT: 0 %
Basophils Absolute: 0 10*3/uL (ref 0.0–0.1)
EOS ABS: 0.2 10*3/uL (ref 0.0–0.7)
Eosinophils Relative: 3 %
HEMATOCRIT: 31.2 % — AB (ref 39.0–52.0)
HEMOGLOBIN: 9.4 g/dL — AB (ref 13.0–17.0)
LYMPHS PCT: 42 %
Lymphs Abs: 2.6 10*3/uL (ref 0.7–4.0)
MCH: 19.7 pg — AB (ref 26.0–34.0)
MCHC: 30.1 g/dL (ref 30.0–36.0)
MCV: 65.4 fL — ABNORMAL LOW (ref 78.0–100.0)
MONOS PCT: 12 %
Monocytes Absolute: 0.8 10*3/uL (ref 0.1–1.0)
NEUTROS ABS: 2.7 10*3/uL (ref 1.7–7.7)
NEUTROS PCT: 43 %
Platelets: 253 10*3/uL (ref 150–400)
RBC: 4.77 MIL/uL (ref 4.22–5.81)
RDW: 16.8 % — ABNORMAL HIGH (ref 11.5–15.5)
WBC: 6.3 10*3/uL (ref 4.0–10.5)

## 2018-03-14 LAB — BASIC METABOLIC PANEL
Anion gap: 9 (ref 5–15)
BUN: 15 mg/dL (ref 6–20)
CHLORIDE: 104 mmol/L (ref 101–111)
CO2: 24 mmol/L (ref 22–32)
Calcium: 9.2 mg/dL (ref 8.9–10.3)
Creatinine, Ser: 1.28 mg/dL — ABNORMAL HIGH (ref 0.61–1.24)
GFR calc non Af Amer: 59 mL/min — ABNORMAL LOW (ref >60)
Glucose, Bld: 102 mg/dL — ABNORMAL HIGH (ref 65–99)
POTASSIUM: 3.9 mmol/L (ref 3.5–5.1)
SODIUM: 137 mmol/L (ref 135–145)

## 2018-03-14 LAB — PROTIME-INR
INR: 1.09
Prothrombin Time: 14 seconds (ref 11.4–15.2)

## 2018-03-14 LAB — D-DIMER, QUANTITATIVE: D-Dimer, Quant: 1.11 mcg/mL FEU — ABNORMAL HIGH (ref <0.50)

## 2018-03-14 LAB — APTT: aPTT: 41 seconds — ABNORMAL HIGH (ref 24–36)

## 2018-03-14 MED ORDER — FUROSEMIDE 40 MG PO TABS
40.0000 mg | ORAL_TABLET | Freq: Every day | ORAL | Status: DC
  Administered 2018-03-14 – 2018-03-15 (×2): 40 mg via ORAL
  Filled 2018-03-14: qty 2
  Filled 2018-03-14: qty 1

## 2018-03-14 MED ORDER — OXYCODONE-ACETAMINOPHEN 5-325 MG PO TABS
1.0000 | ORAL_TABLET | Freq: Two times a day (BID) | ORAL | Status: DC
  Administered 2018-03-14 – 2018-03-15 (×2): 1 via ORAL
  Filled 2018-03-14 (×2): qty 1

## 2018-03-14 MED ORDER — OXYCODONE-ACETAMINOPHEN 10-325 MG PO TABS: 1.0000 | ORAL_TABLET | Freq: Two times a day (BID) | ORAL | Status: DC

## 2018-03-14 MED ORDER — CLONIDINE HCL 0.2 MG PO TABS
0.2000 mg | ORAL_TABLET | Freq: Two times a day (BID) | ORAL | Status: DC
  Administered 2018-03-14 – 2018-03-15 (×2): 0.2 mg via ORAL
  Filled 2018-03-14 (×2): qty 1

## 2018-03-14 MED ORDER — OXYCODONE-ACETAMINOPHEN 5-325 MG PO TABS
2.0000 | ORAL_TABLET | Freq: Once | ORAL | Status: AC
  Administered 2018-03-14: 2 via ORAL
  Filled 2018-03-14: qty 2

## 2018-03-14 MED ORDER — NICOTINE 7 MG/24HR TD PT24
7.0000 mg | MEDICATED_PATCH | Freq: Every day | TRANSDERMAL | Status: DC
Start: 2018-03-15 — End: 2018-03-15
  Filled 2018-03-14: qty 1

## 2018-03-14 MED ORDER — LABETALOL HCL 200 MG PO TABS
200.0000 mg | ORAL_TABLET | Freq: Two times a day (BID) | ORAL | Status: DC
  Administered 2018-03-14 – 2018-03-15 (×2): 200 mg via ORAL
  Filled 2018-03-14 (×2): qty 1

## 2018-03-14 MED ORDER — HEPARIN (PORCINE) IN NACL 100-0.45 UNIT/ML-% IJ SOLN
1850.0000 [IU]/h | INTRAMUSCULAR | Status: DC
  Administered 2018-03-14: 1850 [IU]/h via INTRAVENOUS
  Filled 2018-03-14 (×2): qty 250

## 2018-03-14 MED ORDER — SODIUM CHLORIDE 0.9 % IV SOLN
INTRAVENOUS | Status: DC
  Administered 2018-03-14: 20:00:00 via INTRAVENOUS

## 2018-03-14 MED ORDER — HEPARIN (PORCINE) IN NACL 100-0.45 UNIT/ML-% IJ SOLN
2100.0000 [IU]/h | INTRAMUSCULAR | Status: AC
  Administered 2018-03-14: 1850 [IU]/h via INTRAVENOUS
  Filled 2018-03-14: qty 250

## 2018-03-14 MED ORDER — HYDRALAZINE HCL 20 MG/ML IJ SOLN
10.0000 mg | Freq: Once | INTRAMUSCULAR | Status: AC
Start: 2018-03-14 — End: 2018-03-14
  Administered 2018-03-14: 10 mg via INTRAVENOUS
  Filled 2018-03-14: qty 1

## 2018-03-14 MED ORDER — IOPAMIDOL (ISOVUE-370) INJECTION 76%
INTRAVENOUS | Status: AC
  Administered 2018-03-14: 100 mL
  Filled 2018-03-14: qty 100

## 2018-03-14 MED ORDER — OXYCODONE HCL 5 MG PO TABS
5.0000 mg | ORAL_TABLET | Freq: Two times a day (BID) | ORAL | Status: DC
  Administered 2018-03-14 – 2018-03-15 (×2): 5 mg via ORAL
  Filled 2018-03-14 (×2): qty 1

## 2018-03-14 MED ORDER — HEPARIN BOLUS VIA INFUSION
5000.0000 [IU] | Freq: Once | INTRAVENOUS | Status: AC
  Administered 2018-03-14: 5000 [IU] via INTRAVENOUS
  Filled 2018-03-14: qty 5000

## 2018-03-14 NOTE — Telephone Encounter (Signed)
Regarding Jeremy Douglas 1956-09-16    Call from radiology Dr Bryson Ha - patient has PE with RV strain  Called patient cell and home numer - went to vm . So, got hold of his [name] 6:30 PM 03/14/2018 - gave her results of pulmonary embolism and emphasized to her he needs to go to ER to get admitted straight away. She said  A. That she dropped him off for CT at [facility] radiology dept but he was supposed to call her back for ride back but he never did. She suspects she took [place] back  B. She said she will try to reach him stat and have him call me back [phone] where I can be for anotehr 5-10 minutes so I can personally talk to him   C. She said she will personally take him to [facility] ER; closes location to their home  D. Daughter had not called back - so I Called patient on cell [phone] and  again 6:36 PM 03/14/2018 - this time Left specific dx of PE and to got to ER straightway  E. Called work nubmer [phone] 6:38 PM 03/14/2018 but no one answered  F. Called other name Jeremy Douglas  In order to reach patient directly- [phone] 6:39 PM but she said she is no longer associated with him. No results or information or status or even rationale for call  given to her therefore. Will ask pulmonary office to remove this name from the list  G. Called daughter again [phone]  -> 6:42 PM  03/14/2018 -> She said she went to his home but he is not there. Advised her that I am at [employer]. She dropped him off at [facility]. Advised that she should go to the radiology department at [facility] to try to locate him   Typically radiology tech wll page doc but I have not been paged (my pager is working)  F. Called [place] hospital operator 6:50 PM 03/14/2018 - to contact security to go fetch patient anywhere at [facility] but operator said she is unable to reach security because ER is under lockdown. So called ER nurse and she said they just admitted hm in D[facility] at [facility]  ER   G. Called daughter again 6:53 PM and gave her update that he is in [address] [place] ER. Gave her dx. Informed daughter  That Jeremy Douglas was called. She was ok with it. She is patient ex-girl friend   Dr. Kalman Shan, M.D., [facility].C.P Pulmonary and Critical Care Medicine Staff Physician, [facility] Center Director - Interstitial Lung Disease  Program  [facility] [place], [place], [address]  Pager: [phone], If no answer or between  15:00h - 7:00h: call [phone] Telephone: [phone]   Dg Chest 2 View  Result Date: 03/13/2018 CLINICAL DATA:  Fall.  Shortness of breath. EXAM: CHEST - 2 VIEW COMPARISON:  04/20/2014. FINDINGS: Mediastinum hilar structures normal. Cardiomegaly with mild bilateral interstitial prominence. Mild CHF may be present. Mild pneumonitis could present this fashion as well. No pleural effusion or pneumothorax. Diffuse degenerative change and osteopenia thoracic spine. No acute bony abnormality. IMPRESSION: Cardiomegaly with mild diffuse bilateral interstitial prominence. Mild CHF may be present. Mild pneumonitis could also present in this fashion. Electronically Signed   By: Maisie Fus  Register   On: 03/13/2018 17:00   Ct Angio Chest W/cm &/  or Wo Cm  Addendum Date: 03/14/2018   ADDENDUM REPORT: 03/14/2018 18:25 ADDENDUM: These results were called by telephone at the time of interpretation on 03/14/2018 at 6:25 pm to Dr. Marchelle Gearingamaswamy covering for NP Parrett , who verbally acknowledged these results. Electronically Signed   By: Tollie Ethavid  Kwon M.D.   On: 03/14/2018 18:25   Result Date: 03/14/2018 CLINICAL DATA:  Dyspnea x1 week.  Positive D-dimer. EXAM: CT ANGIOGRAPHY CHEST WITH CONTRAST TECHNIQUE: Multidetector CT imaging of the chest was performed using the standard protocol during bolus administration of intravenous contrast. Multiplanar CT image reconstructions and MIPs were obtained to evaluate the  vascular anatomy. CONTRAST:  100mL ISOVUE-370 IOPAMIDOL (ISOVUE-370) INJECTION 76% COMPARISON:  None. FINDINGS: Cardiovascular: Small intraluminal filling defects within right lower lobe pulmonary segmental arterial branches. Findings are in keeping with acute pulmonary emboli. RV/LV ratio is elevated to 1.17. Heart is top normal without pericardial effusion. Coronary arteriosclerosis is identified along the LAD and RCA. Mild aortic atherosclerosis at the arch without aneurysm. No dissection. Mediastinum/Nodes: Mildly enlarged mediastinal lymph nodes measuring up to 12 mm in the precarinal and AP window stations. Lungs/Pleura: Patchy ground-glass open UA shin of the lungs bilaterally which may represent areas of hypoventilatory change, alveolitis/pneumonitis or stigmata of mild CHF. No effusion or pneumothorax. No pulmonary consolidation. No dominant mass. Mild pleural thickening is seen along the major fissures. Upper Abdomen: No acute abnormality. Musculoskeletal: No chest wall abnormality. Diffuse idiopathic skeletal hyperostosis is noted of the lower thoracic spine with flowing anterior osteophytes demonstrated. Multilevel mild degenerative disc space narrowing is seen. Review of the MIP images confirms the above findings. IMPRESSION: Positive for acute PE to the right lower lobe with CT evidence of right heart strain (RV/LV Ratio = 1.17) consistent with at least submassive (intermediate risk) PE. The presence of right heart strain has been associated with an increased risk of morbidity and mortality. These results will be called as requested to the ordering clinician or representative by the Radiologist Assistant, and communication documented in the PACS or zVision Dashboard. Coronary arteriosclerosis and aortic atherosclerosis. Aortic Atherosclerosis (ICD10-I70.0). Electronically Signed: By: Tollie Ethavid  Kwon M.D. On: 03/14/2018 17:37

## 2018-03-14 NOTE — ED Notes (Signed)
ED Provider at bedside. 

## 2018-03-14 NOTE — Progress Notes (Signed)
Pt arrived to unit and ambulated independently to bed. Identified appropriately and assessed. Skin intact, small abrasion to right lower leg. Pt BP 194/69, MAP 97, HR 65. Notified MD of vital signs. Placed on telemetry, instructed pt to call when needing to ambulate. Also educated pt on risk of bleeding posed by being on heparin drip, pt verbalizes understanding. Oriented to room and unit, call bell within reach. Will continue to monitor.

## 2018-03-14 NOTE — Telephone Encounter (Signed)
[facility] Angio [facility] on pt Order still needs to be cosigned but CY is unavailable to do so Spoke with TP, okay to place order under her name  This has been done, TP is aware and order has been cosigned Nothing further needed at this time; will sign and route to CY to make him aware
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
## 2018-03-14 NOTE — Progress Notes (Signed)
Unable to reach physician after multiple attempts.  Spoke with Radiologist "states CT is abnormal and patient should go to the ED".  This Technologist took patient to the ED room D-33 after consulting charge nurse.

## 2018-03-14 NOTE — Addendum Note (Signed)
Addended by: Boone MasterJONES, JESSICA E on: 03/14/2018 03:50 PM   Modules accepted: Orders

## 2018-03-14 NOTE — Telephone Encounter (Signed)
Order re-done as STAT as requested by Cordelia PenSherry

## 2018-03-14 NOTE — ED Provider Notes (Addendum)
[facility] EMERGENCY DEPARTMENT Provider Note   CSN: [id number] Arrival date & time: 03/14/18  1844     History   Chief Complaint Chief Complaint  Patient presents with  . Shortness of Breath    frm CT, poss PE    HPI Jeremy Douglas is a 62 y.o. male.  62 year old male presents with shortness of breath times several weeks that became worse.  Was seen by his pulmonologist who ordered an outpatient chest CT which was positive for pulmonary embolus with right heart strain.  Patient denies any chest pain at this time.  Has had some increased dyspnea on exertion.  No fever or cough.  No leg pain or swelling.  He denies any syncope or near syncope.     Past Medical History:  Diagnosis Date  . Alcohol dependence (HCC) 09/07/2012  . Anemia   . Esophagitis 09/07/2012   Per EGD 04/2011  . Gastritis 09/07/2012   Per EGD 04/2011  . Headache    4 - 5 a yr (no migraines)  . HTN (hypertension) 09/07/2012  . Hypertension    takes Amlodipine,Labetalol,and Clonidine daily  . Ischemic chest pain   . Obesity   . Sleep apnea    pt states he is on bipap at night for sleep apnea  . Thrombocytopenia (HCC) 09/07/2012   Hx of in past.  . Tobacco abuse     Patient Active Problem List   Diagnosis Date Noted  . Shortness of breath 03/14/2018  . Primary osteoarthritis of right knee 10/15/2014  . Osteoarthritis of left knee 03/19/2014  . Morbid obesity (HCC) 03/19/2014  . Anemia 09/09/2012  . Depression 09/09/2012  . Hypophosphatemia 09/08/2012  . Transaminitis 09/08/2012  . Diarrhea 09/08/2012  . Tylenol overdose 09/07/2012  . Suicidal overdose (HCC) 09/07/2012  . Hypertensive urgency 09/07/2012  . HTN (hypertension) 09/07/2012  . Hypokalemia 09/07/2012  . Alcohol dependence (HCC) 09/07/2012  . Gastritis 09/07/2012  . Esophagitis 09/07/2012  . Thrombocytopenia (HCC) 09/07/2012  . Chest pain 09/07/2012  . Tobacco use disorder, moderate, in sustained remission  09/10/2008  . Essential hypertension 09/10/2008  . Obstructive sleep apnea 09/10/2008    Past Surgical History:  Procedure Laterality Date  . COLONOSCOPY    . KNEE ARTHROSCOPY Left 09/18/2013   Procedure: ARTHROSCOPY KNEE, PARTIAL MEDIAL AND LATERAL MENISCECTOMY, CHONDROPLASTY PATELLA FEMORAL JOINT;  Surgeon: Harvie Junior, MD;  Location: [facility];  Service: Orthopedics;  Laterality: Left;  . Rigth Knee Arthroscopy  06/20/2011  . TONSILLECTOMY     as a child  . TOTAL KNEE ARTHROPLASTY Left 03/19/2014   Procedure: LEFT TOTAL KNEE ARTHROPLASTY;  Surgeon: Harvie Junior, MD;  Location: [facility] OR;  Service: Orthopedics;  Laterality: Left;  . TOTAL KNEE ARTHROPLASTY Right 10/15/2014   Procedure: RIGHT TOTAL KNEE ARTHROPLASTY;  Surgeon: Harvie Junior, MD;  Location: [facility] OR;  Service: Orthopedics;  Laterality: Right;  . UPPER GI ENDOSCOPY          Home Medications    Prior to Admission medications   Medication Sig Start Date End Date Taking? Authorizing Provider  aspirin 81 MG tablet Take 81 mg by mouth daily.    [provider]  B Complex-C (SUPER B COMPLEX PO) Take 1 tablet by mouth daily.    [provider]  cloNIDine (CATAPRES) 0.1 MG tablet Take 0.2 mg by mouth 2 (two) times daily.     [provider]  furosemide (LASIX) 40 MG tablet Take 40  mg by mouth daily.    [provider]  hydrOXYzine (ATARAX/VISTARIL) 10 MG tablet Take 10 mg by mouth as needed. 01/16/16   [provider]  labetalol (NORMODYNE) 200 MG tablet Take 200 mg by mouth 2 (two) times daily.     [provider]  LYRICA 100 MG capsule Take 100 mg by mouth at bedtime. 01/31/16   [provider]  Multiple Vitamin (MULITIVITAMIN WITH MINERALS) TABS Take 1 tablet by mouth daily.     [provider]  oxyCODONE-acetaminophen (PERCOCET) 10-325 MG tablet Take 1-2 tablets by mouth every 5 hours as needed for severe pain    [provider]     Family History History reviewed. No pertinent family history.  Social History Social History   Tobacco Use  . Smoking status: Former Smoker    Packs/day: 0.80    Years: 31.00    Pack years: 24.80    Types: Cigarettes    Last attempt to quit: 03/14/2013    Years since quitting: 5.0  . Smokeless tobacco: Never Used  Substance Use Topics  . Alcohol use: Yes    Comment: -hx abuse      not drank X 10 mos  . Drug use: No     Allergies   Amlodipine   Review of Systems Review of Systems  All other systems reviewed and are negative.    Physical Exam Updated Vital Signs Ht 1.803 m (5\' 11" )   Wt 124.7 kg (275 lb)   BMI 38.35 kg/m   Physical Exam  Constitutional: He is oriented to person, place, and time. He appears well-developed and well-nourished.  Non-toxic appearance. No distress.  HENT:  Head: Normocephalic and atraumatic.  Eyes: Pupils are equal, round, and reactive to light. Conjunctivae, EOM and lids are normal.  Neck: Normal range of motion. Neck supple. No tracheal deviation present. No thyroid mass present.  Cardiovascular: Normal rate, regular rhythm and normal heart sounds. Exam reveals no gallop.  No murmur heard. Pulmonary/Chest: Effort normal and breath sounds normal. No stridor. No respiratory distress. He has no decreased breath sounds. He has no wheezes. He has no rhonchi. He has no rales.  Abdominal: Soft. Normal appearance and bowel sounds are normal. He exhibits no distension. There is no tenderness. There is no rebound and no CVA tenderness.  Musculoskeletal: Normal range of motion. He exhibits no edema or tenderness.  Neurological: He is alert and oriented to person, place, and time. He has normal strength. No cranial nerve deficit or sensory deficit. GCS eye subscore is 4. GCS verbal subscore is 5. GCS motor subscore is 6.  Skin: Skin is warm and dry. No abrasion and no rash noted.  Psychiatric: He has a normal mood and affect. His speech is  normal and behavior is normal.  Nursing note and vitals reviewed.    ED Treatments / Results  Labs (all labs ordered are listed, but only abnormal results are displayed) Labs Reviewed  CBC WITH DIFFERENTIAL/PLATELET  BASIC METABOLIC PANEL  PROTIME-INR  APTT  HEPARIN LEVEL (UNFRACTIONATED)  CBC    EKG EKG Interpretation  Date/Time:  Friday March 14 2018 20:46:37 EDT Ventricular Rate:  59 PR Interval:    QRS Duration: 117 QT Interval:  488 QTC Calculation: 484 R Axis:   -4 Text Interpretation:  Sinus rhythm Prolonged PR interval Probable left atrial enlargement Incomplete right bundle branch block No significant change since last tracing Confirmed by Lorre NickAllen, Tynan Boesel ([id number]) on 03/14/2018 8:59:02 PM   Radiology  Dg Chest 2 View  Result Date: 03/13/2018 CLINICAL DATA:  Fall.  Shortness of breath. EXAM: CHEST - 2 VIEW COMPARISON:  04/20/2014. FINDINGS: Mediastinum hilar structures normal. Cardiomegaly with mild bilateral interstitial prominence. Mild CHF may be present. Mild pneumonitis could present this fashion as well. No pleural effusion or pneumothorax. Diffuse degenerative change and osteopenia thoracic spine. No acute bony abnormality. IMPRESSION: Cardiomegaly with mild diffuse bilateral interstitial prominence. Mild CHF may be present. Mild pneumonitis could also present in this fashion. Electronically Signed   By: Maisie Fus  Register   On: 03/13/2018 17:00   Ct Angio Chest W/cm &/or Wo Cm  Addendum Date: 03/14/2018   ADDENDUM REPORT: 03/14/2018 18:25 ADDENDUM: These results were called by telephone at the time of interpretation on 03/14/2018 at 6:25 pm to Dr. Marchelle Gearing covering for NP Parrett , who verbally acknowledged these results. Electronically Signed   By: Tollie Eth M.D.   On: 03/14/2018 18:25   Result Date: 03/14/2018 CLINICAL DATA:  Dyspnea x1 week.  Positive D-dimer. EXAM: CT ANGIOGRAPHY CHEST WITH CONTRAST TECHNIQUE: Multidetector CT imaging of the chest was performed  using the standard protocol during bolus administration of intravenous contrast. Multiplanar CT image reconstructions and MIPs were obtained to evaluate the vascular anatomy. CONTRAST:  ISOVUE-370 IOPAMIDOL (ISOVUE-370) INJECTION 76% COMPARISON:  None. FINDINGS: Cardiovascular: Small intraluminal filling defects within right lower lobe pulmonary segmental arterial branches. Findings are in keeping with acute pulmonary emboli. RV/LV ratio is elevated to 1.17. Heart is top normal without pericardial effusion. Coronary arteriosclerosis is identified along the LAD and RCA. Mild aortic atherosclerosis at the arch without aneurysm. No dissection. Mediastinum/Nodes: Mildly enlarged mediastinal lymph nodes measuring up to 12 mm in the precarinal and AP window stations. Lungs/Pleura: Patchy ground-glass open UA shin of the lungs bilaterally which may represent areas of hypoventilatory change, alveolitis/pneumonitis or stigmata of mild CHF. No effusion or pneumothorax. No pulmonary consolidation. No dominant mass. Mild pleural thickening is seen along the major fissures. Upper Abdomen: No acute abnormality. Musculoskeletal: No chest wall abnormality. Diffuse idiopathic skeletal hyperostosis is noted of the lower thoracic spine with flowing anterior osteophytes demonstrated. Multilevel mild degenerative disc space narrowing is seen. Review of the MIP images confirms the above findings. IMPRESSION: Positive for acute PE to the right lower lobe with CT evidence of right heart strain (RV/LV Ratio = 1.17) consistent with at least submassive (intermediate risk) PE. The presence of right heart strain has been associated with an increased risk of morbidity and mortality. These results will be called as requested to the ordering clinician or representative by the Radiologist Assistant, and communication documented in the PACS or zVision Dashboard. Coronary arteriosclerosis and aortic atherosclerosis. Aortic Atherosclerosis  (ICD10-I70.0). Electronically Signed: By: Tollie Eth M.D. On: 03/14/2018 17:37    Procedures Procedures (including critical care time)  Medications Ordered in ED Medications  0.9 %  sodium chloride infusion (has no administration in time range)  heparin bolus via infusion 5,000 Units (has no administration in time range)  heparin ADULT infusion 100 units/mL (25000 units/29mL sodium chloride 0.45%) (has no administration in time range)     Initial Impression / Assessment and Plan / ED Course  I have reviewed the triage vital signs and the nursing notes.  Pertinent labs & imaging results that were available during my care of the patient were reviewed by me and considered in my medical decision making (see chart for details).     Chest CT results noted.  Patient will be started on  heparin per pharmacy protocol and admitted to the hospitalist service.  Final Clinical Impressions(s) / ED Diagnoses   Final diagnoses:  None    ED Discharge Orders    None       Lorre Nick, MD 03/14/18 1610    Lorre Nick, MD 03/14/18 2059

## 2018-03-14 NOTE — H&P (Signed)
History and Physical    Rennie PlowmanRoger C Belko ZOX:[id number] DOB: 04/27/1956 DOA: 03/14/2018  PCP: Darrow BussingKoirala, Dibas, MD  Patient coming from: home   Chief Complaint: shortness of breat  HPI: Rennie PlowmanRoger C Jeremy Douglas is a 62 y.o. male with medical history significant for morbid obesity, smoking, osa on cpap, resistant hypertension, major depression, probable COPD, and anemia, who complains of approximately 3 months dyspnea on exertion. Denies hemoptysis. Denies LE swelling. Is not worsening, but also not improving. No history PE/DVT. No family history of such. Denies recent immobilization or hospitalization. Denies history GI bleed (but chart review reveals positive ifobts). Says has had a colonoscopy within past 10 years that had "mild polyp." alcohol use is sometimes. Says baseline hgb is 10 and has had anemia since he was a child. Is not aware of a diagnosis. Denies hematochezia/melena or other bleeding.    ED Course: imaging, heparin, labs  Review of Systems: As per HPI otherwise 10 point review of systems negative.    Past Medical History:  Diagnosis Date  . Alcohol dependence (HCC) 09/07/2012  . Anemia   . Esophagitis 09/07/2012   Per EGD 04/2011  . Gastritis 09/07/2012   Per EGD 04/2011  . Headache    4 - 5 a yr (no migraines)  . HTN (hypertension) 09/07/2012  . Hypertension    takes Amlodipine,Labetalol,and Clonidine daily  . Ischemic chest pain   . Obesity   . Sleep apnea    pt states he is on bipap at night for sleep apnea  . Thrombocytopenia (HCC) 09/07/2012   Hx of in past.  . Tobacco abuse     Past Surgical History:  Procedure Laterality Date  . COLONOSCOPY    . KNEE ARTHROSCOPY Left 09/18/2013   Procedure: ARTHROSCOPY KNEE, PARTIAL MEDIAL AND LATERAL MENISCECTOMY, CHONDROPLASTY PATELLA FEMORAL JOINT;  Surgeon: [name] Graves, MD;  Location: [facility];  Service: Orthopedics;  Laterality: Left;  . Rigth Knee Arthroscopy  06/20/2011  . TONSILLECTOMY     as a child  .  TOTAL KNEE ARTHROPLASTY Left 03/19/2014   Procedure: LEFT TOTAL KNEE ARTHROPLASTY;  Surgeon: [name] Graves, MD;  Location: [facility] OR;  Service: Orthopedics;  Laterality: Left;  . TOTAL KNEE ARTHROPLASTY Right 10/15/2014   Procedure: RIGHT TOTAL KNEE ARTHROPLASTY;  Surgeon: [name] Graves, MD;  Location: [facility] OR;  Service: Orthopedics;  Laterality: Right;  . UPPER GI ENDOSCOPY       reports that he quit smoking about 5 years ago. His smoking use included cigarettes. He has a 24.80 pack-year smoking history. He has never used smokeless tobacco. He reports that he drinks alcohol. He reports that he does not use drugs.  Allergies  Allergen Reactions  . Amlodipine Other (See Comments)    When taking 10 mg tablets pt had fluid retention in the legs/ankles but does not have issues with the 2.5 mg tablets     History reviewed. No pertinent family history.  Prior to Admission medications   Medication Sig Start Date End Date Taking? Authorizing Provider  aspirin 81 MG tablet Take 81 mg by mouth daily.    [provider]  B Complex-C (SUPER B COMPLEX PO) Take 1 tablet by mouth daily.    [provider]  cloNIDine (CATAPRES) 0.1 MG tablet Take 0.2 mg by mouth 2 (two) times daily.     [provider]  furosemide (LASIX) 40 MG tablet Take 40 mg by mouth daily.    [provider]  hydrOXYzine (  ATARAX/VISTARIL) 10 MG tablet Take 10 mg by mouth as needed. 01/16/16   [provider]  labetalol (NORMODYNE) 200 MG tablet Take 200 mg by mouth 2 (two) times daily.     [provider]  Multiple Vitamin (MULITIVITAMIN WITH MINERALS) TABS Take 1 tablet by mouth daily.     [provider]  oxyCODONE-acetaminophen (PERCOCET) 10-325 MG tablet Take 1-2 tablets by mouth every 5 hours as needed for severe pain    [provider]    Physical Exam: Vitals:   03/14/18 2000 03/14/18 2030 03/14/18 2100 03/14/18 2112  BP: (!) 181/64 (!) 196/74 (!) 220/82 (!)  222/89  Pulse: (!) 56 63 62 (!) 59  Resp: 20 (!) 21 18 (!) 23  SpO2: 95% 91% 92% 94%  Weight:      Height:        Constitutional: No acute distress Head: Atraumatic Eyes: Conjunctiva clear ENM: Moist mucous membranes. Normal dentition.  Neck: Supple Respiratory: Clear to auscultation bilaterally save for mild rales at bases Cardiovascular: Regular rate and rhythm. No murmurs/rubs/gallops. Abdomen: Non-tender, non-distended. No masses. No rebound or guarding. Positive bowel sounds. Musculoskeletal: No joint deformity upper and lower extremities. Normal ROM, no contractures. Normal muscle tone.  Skin: No rashes, lesions, or ulcers.  Extremities: mild LE edema that is symmetric, no tenderness or palpable cord Neurologic: Alert, moving all 4 extremities. Psychiatric: Normal insight and judgement.   Labs on Admission: I have personally reviewed following labs and imaging studies  CBC: Recent Labs  Lab 03/13/18 1130 03/14/18 1932  WBC 5.2 6.3  NEUTROABS 2.0 2.7  HGB 10.2* 9.4*  HCT 33.3* 31.2*  MCV 64.7 Repeated and verified X2.* 65.4*  PLT 280.0 253   Basic Metabolic Panel: Recent Labs  Lab 03/13/18 1130 03/14/18 1932  NA 142 137  K 5.0 3.9  CL 109 104  CO2 24 24  GLUCOSE 99 102*  BUN 15 15  CREATININE 1.31 1.28*  CALCIUM 10.2 9.2   GFR: Estimated Creatinine Clearance: 82.6 mL/min (A) (by C-G formula based on SCr of 1.28 mg/dL (H)). Liver Function Tests: No results for input(s): AST, ALT, ALKPHOS, BILITOT, PROT, ALBUMIN in the last 168 hours. No results for input(s): LIPASE, AMYLASE in the last 168 hours. No results for input(s): AMMONIA in the last 168 hours. Coagulation Profile: Recent Labs  Lab 03/14/18 1932  INR 1.09   Cardiac Enzymes: No results for input(s): CKTOTAL, CKMB, CKMBINDEX, TROPONINI in the last 168 hours. BNP (last 3 results) Recent Labs    03/13/18 1130  PROBNP 171.0*   HbA1C: No results for input(s): HGBA1C in the last 72  hours. CBG: No results for input(s): GLUCAP in the last 168 hours. Lipid Profile: No results for input(s): CHOL, HDL, LDLCALC, TRIG, CHOLHDL, LDLDIRECT in the last 72 hours. Thyroid Function Tests: No results for input(s): TSH, T4TOTAL, FREET4, T3FREE, THYROIDAB in the last 72 hours. Anemia Panel: No results for input(s): VITAMINB12, FOLATE, FERRITIN, TIBC, IRON, RETICCTPCT in the last 72 hours. Urine analysis:    Component Value Date/Time   COLORURINE YELLOW 10/12/2014 1332   APPEARANCEUR CLEAR 10/12/2014 1332   LABSPEC 1.020 10/12/2014 1332   PHURINE 6.0 10/12/2014 1332   GLUCOSEU NEGATIVE 10/12/2014 1332   HGBUR NEGATIVE 10/12/2014 1332   BILIRUBINUR NEGATIVE 10/12/2014 1332   KETONESUR NEGATIVE 10/12/2014 1332   PROTEINUR 30 (A) 10/12/2014 1332   UROBILINOGEN 0.2 10/12/2014 1332   NITRITE NEGATIVE 10/12/2014 1332   LEUKOCYTESUR NEGATIVE 10/12/2014 1332    Radiological  Exams on Admission: Dg Chest 2 View  Result Date: 03/13/2018 CLINICAL DATA:  Fall.  Shortness of breath. EXAM: CHEST - 2 VIEW COMPARISON:  04/20/2014. FINDINGS: Mediastinum hilar structures normal. Cardiomegaly with mild bilateral interstitial prominence. Mild CHF may be present. Mild pneumonitis could present this fashion as well. No pleural effusion or pneumothorax. Diffuse degenerative change and osteopenia thoracic spine. No acute bony abnormality. IMPRESSION: Cardiomegaly with mild diffuse bilateral interstitial prominence. Mild CHF may be present. Mild pneumonitis could also present in this fashion. Electronically Signed   By: Maisie Fus  Register   On: 03/13/2018 17:00   Ct Angio Chest W/cm &/or Wo Cm  Addendum Date: 03/14/2018   ADDENDUM REPORT: 03/14/2018 18:25 ADDENDUM: These results were called by telephone at the time of interpretation on 03/14/2018 at 6:25 pm to Dr. Marchelle Gearing covering for NP Parrett , who verbally acknowledged these results. Electronically Signed   By: Tollie Eth M.D.   On: 03/14/2018 18:25    Result Date: 03/14/2018 CLINICAL DATA:  Dyspnea x1 week.  Positive D-dimer. EXAM: CT ANGIOGRAPHY CHEST WITH CONTRAST TECHNIQUE: Multidetector CT imaging of the chest was performed using the standard protocol during bolus administration of intravenous contrast. Multiplanar CT image reconstructions and MIPs were obtained to evaluate the vascular anatomy. CONTRAST:  ISOVUE-370 IOPAMIDOL (ISOVUE-370) INJECTION 76% COMPARISON:  None. FINDINGS: Cardiovascular: Small intraluminal filling defects within right lower lobe pulmonary segmental arterial branches. Findings are in keeping with acute pulmonary emboli. RV/LV ratio is elevated to 1.17. Heart is top normal without pericardial effusion. Coronary arteriosclerosis is identified along the LAD and RCA. Mild aortic atherosclerosis at the arch without aneurysm. No dissection. Mediastinum/Nodes: Mildly enlarged mediastinal lymph nodes measuring up to 12 mm in the precarinal and AP window stations. Lungs/Pleura: Patchy ground-glass open UA shin of the lungs bilaterally which may represent areas of hypoventilatory change, alveolitis/pneumonitis or stigmata of mild CHF. No effusion or pneumothorax. No pulmonary consolidation. No dominant mass. Mild pleural thickening is seen along the major fissures. Upper Abdomen: No acute abnormality. Musculoskeletal: No chest wall abnormality. Diffuse idiopathic skeletal hyperostosis is noted of the lower thoracic spine with flowing anterior osteophytes demonstrated. Multilevel mild degenerative disc space narrowing is seen. Review of the MIP images confirms the above findings. IMPRESSION: Positive for acute PE to the right lower lobe with CT evidence of right heart strain (RV/LV Ratio = 1.17) consistent with at least submassive (intermediate risk) PE. The presence of right heart strain has been associated with an increased risk of morbidity and mortality. These results will be called as requested to the ordering clinician or  representative by the Radiologist Assistant, and communication documented in the PACS or zVision Dashboard. Coronary arteriosclerosis and aortic atherosclerosis. Aortic Atherosclerosis (ICD10-I70.0). Electronically Signed: By: Tollie Eth M.D. On: 03/14/2018 17:37    EKG: Independently reviewed. No ischemic changes  Assessment/Plan Active Problems:   Tobacco use disorder, moderate, in sustained remission   Essential hypertension   Obstructive sleep apnea   Hypertensive urgency   Morbid obesity (HCC)   Pulmonary embolism (HCC)   Chronic pain   Microcytic anemia   COPD (chronic obstructive pulmonary disease) (HCC)   # Acute pulmonary embolism - moderate, RLL, with evidence right heart strain. Hemodynamically stable. Appears to be unprovoked. Given 2-3 months of symptoms not acutely worsened, possibly more subacute in nature. Has-bled score is 2-3 (hypertension, borderline ckd, etoh consumption), which is borderline high risk for bleeding. Patient says anemia is longstanding and baseline hemoblogin is 10 and he denies hematochezia,  so think this favors treating. - continue IV heparin overnight, pharmacy consult for apixaban in the morning, likely make that switch tomorrow assuming remains hemodynamically stable - f/u stool occult blood  # Hypertensive urgency - hasn't had home bps, says baseline bp is 160s to 180s.  - resume home meds, prns as needed  # Microcytic anemia - mcv in 60s, likely thalassemia, particularly given pt's statement that present since young age - f/u iron panel, hgb electrophoresis, a1c - am cbc  # chronic pain - continue home percocet  # nicotine abuse - nicotine patch  # OSA - cpap  # elevated creatinine - 1.28, baseline unclear, most recent cr wnl in 2015 - ctm  DVT prophylaxis: heparin as above Code Status: dnr, confirmed w/ patient   Family Communication: daughter cristal Danko [phone]  Disposition Plan: tbd  Consults called: none    Admission status: med/surg tele inpt    Silvano Bilis MD [facility] Pager [phone]  If 7PM-7AM, please contact night-coverage [email or web address] Password [facility]  03/14/2018, 9:46 PM

## 2018-03-14 NOTE — Assessment & Plan Note (Signed)
Very compliant with CPAP by his description, sleeping much better with it. Plan-continue CPAP 16-20

## 2018-03-14 NOTE — ED Notes (Signed)
Admitting MD at bedside.

## 2018-03-14 NOTE — Assessment & Plan Note (Signed)
He points to back spasms after falling at [place] as "taking his breath".  He also notices ankle edema and has neck vein distention.  Consider possible CHF with fluid overload.  Consider possible pulmonary embolism.  Former smoker with uncertain degree of COPD. Plan-CXR, office spirometry, BNP, BMET, CBC, D-dimer

## 2018-03-14 NOTE — Progress Notes (Addendum)
ANTICOAGULATION CONSULT NOTE - Initial Consult  Pharmacy Consult for heparin Indication: pulmonary embolus  Allergies  Allergen Reactions  . Amlodipine Other (See Comments)    When taking 10 mg tablets pt had fluid retention in the legs/ankles but does not have issues with the 2.5 mg tablets     Patient Measurements: Height: 5\' 11"  (180.3 cm) Weight: 275 lb (124.7 kg) IBW/kg (Calculated) : 75.3 Heparin Dosing Weight: 103.3  Vital Signs:    Labs: Recent Labs    03/13/18 1130  HGB 10.2*  HCT 33.3*  PLT 280.0  CREATININE 1.31    Estimated Creatinine Clearance: 80.7 mL/min (by C-G formula based on SCr of 1.31 mg/dL).   Medical History: Past Medical History:  Diagnosis Date  . Alcohol dependence (HCC) 09/07/2012  . Anemia   . Esophagitis 09/07/2012   Per EGD 04/2011  . Gastritis 09/07/2012   Per EGD 04/2011  . Headache    4 - 5 a yr (no migraines)  . HTN (hypertension) 09/07/2012  . Hypertension    takes Amlodipine,Labetalol,and Clonidine daily  . Ischemic chest pain   . Obesity   . Sleep apnea    pt states he is on bipap at night for sleep apnea  . Thrombocytopenia (HCC) 09/07/2012   Hx of in past.  . Tobacco abuse     Medications:  See medication history  Assessment: 62 yo man to start heparin for PE, RLL with evidence of right heart strain per CT. Goal of Therapy:  Heparin level 0.3-0.7 units/ml Monitor platelets by anticoagulation protocol: Yes   Plan:  Heparin 5000 unit bolus and drip at 1850 units/hr Check heparin level in 6 hours Daily heparin level and CBC while on heparin Monitor for bleeding complications  Talbert CageSeay, Doreather Hoxworth Poteet 03/14/2018,7:22 PM   Addum:  Will continue heparin overnight.  Start treatment dose eliquis in am.  F/u education. Talbert CageLora Jaielle Dlouhy, PharmD

## 2018-03-14 NOTE — Telephone Encounter (Signed)
Pt has been scheduled for CT angio [facility] this afternoon.  I left vm for pt but after not reaching him I called & spoke to his dtr.  I gave her the info and she was going by to get him.  I returned pt's call & he is already at [facility] and is getting ready to go back to radiology.  Nothing further needed.

## 2018-03-14 NOTE — ED Triage Notes (Signed)
Pt came to hospital for CT exam; during CT, patient brought to ED for possible PE. Pt states that he has been "little SOB."

## 2018-03-14 NOTE — Telephone Encounter (Signed)
Pt returning a call regarding CT angio ordered today.  Discussed this with Cordelia PenSherry, who spoke to pt's daughter earlier today regarding this.  Routing to [place] for follow-up.

## 2018-03-15 ENCOUNTER — Other Ambulatory Visit: Payer: Self-pay

## 2018-03-15 ENCOUNTER — Inpatient Hospital Stay (HOSPITAL_COMMUNITY): Payer: BLUE CROSS/BLUE SHIELD

## 2018-03-15 DIAGNOSIS — I160 Hypertensive urgency: Secondary | ICD-10-CM | POA: Diagnosis not present

## 2018-03-15 DIAGNOSIS — G8929 Other chronic pain: Secondary | ICD-10-CM | POA: Diagnosis not present

## 2018-03-15 DIAGNOSIS — I340 Nonrheumatic mitral (valve) insufficiency: Secondary | ICD-10-CM

## 2018-03-15 DIAGNOSIS — I2699 Other pulmonary embolism without acute cor pulmonale: Secondary | ICD-10-CM | POA: Diagnosis not present

## 2018-03-15 DIAGNOSIS — J439 Emphysema, unspecified: Secondary | ICD-10-CM | POA: Diagnosis not present

## 2018-03-15 DIAGNOSIS — I10 Essential (primary) hypertension: Secondary | ICD-10-CM | POA: Diagnosis not present

## 2018-03-15 LAB — BASIC METABOLIC PANEL
ANION GAP: 12 (ref 5–15)
BUN: 14 mg/dL (ref 6–20)
CO2: 23 mmol/L (ref 22–32)
Calcium: 8.9 mg/dL (ref 8.9–10.3)
Chloride: 103 mmol/L (ref 101–111)
Creatinine, Ser: 1.26 mg/dL — ABNORMAL HIGH (ref 0.61–1.24)
GFR calc Af Amer: >60 mL/min (ref >60)
GLUCOSE: 110 mg/dL — AB (ref 65–99)
POTASSIUM: 3.9 mmol/L (ref 3.5–5.1)
SODIUM: 138 mmol/L (ref 135–145)

## 2018-03-15 LAB — CBC
HEMATOCRIT: 30.2 % — AB (ref 39.0–52.0)
HEMOGLOBIN: 9.6 g/dL — AB (ref 13.0–17.0)
MCH: 20.7 pg — ABNORMAL LOW (ref 26.0–34.0)
MCHC: 31.8 g/dL (ref 30.0–36.0)
MCV: 65.2 fL — ABNORMAL LOW (ref 78.0–100.0)
Platelets: 160 10*3/uL (ref 150–400)
RBC: 4.63 MIL/uL (ref 4.22–5.81)
RDW: 17.2 % — ABNORMAL HIGH (ref 11.5–15.5)
WBC: 7.1 10*3/uL (ref 4.0–10.5)

## 2018-03-15 LAB — HIV ANTIBODY (ROUTINE TESTING W REFLEX): HIV SCREEN 4TH GENERATION: NONREACTIVE

## 2018-03-15 LAB — ECHOCARDIOGRAM COMPLETE
HEIGHTINCHES: 71 in
WEIGHTICAEL: 4400 [oz_av]

## 2018-03-15 LAB — HEPARIN LEVEL (UNFRACTIONATED): Heparin Unfractionated: 0.28 IU/mL — ABNORMAL LOW (ref 0.30–0.70)

## 2018-03-15 LAB — HEMOGLOBIN A1C
Hgb A1c MFr Bld: 5.1 % (ref 4.8–5.6)
Mean Plasma Glucose: 99.67 mg/dL

## 2018-03-15 LAB — FERRITIN: Ferritin: 219 ng/mL (ref 24–336)

## 2018-03-15 LAB — IRON AND TIBC
IRON: 198 ug/dL — AB (ref 45–182)
Saturation Ratios: 54 % — ABNORMAL HIGH (ref 17.9–39.5)
TIBC: 365 ug/dL (ref 250–450)
UIBC: 167 ug/dL

## 2018-03-15 MED ORDER — APIXABAN 5 MG PO TABS
10.0000 mg | ORAL_TABLET | Freq: Two times a day (BID) | ORAL | Status: DC
  Administered 2018-03-15: 10 mg via ORAL
  Filled 2018-03-15: qty 2

## 2018-03-15 MED ORDER — NICOTINE 7 MG/24HR TD PT24: 7.0000 mg | MEDICATED_PATCH | Freq: Every day | TRANSDERMAL | 0 refills | Status: DC

## 2018-03-15 MED ORDER — ENALAPRILAT 1.25 MG/ML IV SOLN
1.2500 mg | Freq: Once | INTRAVENOUS | Status: AC
  Administered 2018-03-15: 1.25 mg via INTRAVENOUS
  Filled 2018-03-15: qty 1

## 2018-03-15 MED ORDER — APIXABAN 5 MG PO TABS: 5.0000 mg | ORAL_TABLET | Freq: Two times a day (BID) | ORAL | Status: DC

## 2018-03-15 MED ORDER — ELIQUIS 5 MG VTE STARTER PACK: ORAL_TABLET | ORAL | 0 refills | Status: DC

## 2018-03-15 MED ORDER — HYDRALAZINE HCL 20 MG/ML IJ SOLN
20.0000 mg | Freq: Once | INTRAMUSCULAR | Status: AC
  Administered 2018-03-15: 20 mg via INTRAVENOUS
  Filled 2018-03-15: qty 1

## 2018-03-15 MED ORDER — TRAZODONE HCL 50 MG PO TABS
50.0000 mg | ORAL_TABLET | Freq: Every evening | ORAL | Status: AC | PRN
  Administered 2018-03-15: 50 mg via ORAL
  Filled 2018-03-15: qty 1

## 2018-03-15 MED ORDER — HEPARIN BOLUS VIA INFUSION
2000.0000 [IU] | Freq: Once | INTRAVENOUS | Status: AC
  Administered 2018-03-15: 2000 [IU] via INTRAVENOUS
  Filled 2018-03-15: qty 2000

## 2018-03-15 NOTE — Progress Notes (Signed)
Patient provided with 30 day and 1 year copay card for Eliquis

## 2018-03-15 NOTE — Progress Notes (Signed)
Bilateral lower extremity venous duplex completed. There is no evidence of a DVT, superficial thrombosis, or Baker's cyst. Jeremy Douglas, RVS  03/15/2018, 12:55 PM

## 2018-03-15 NOTE — Progress Notes (Signed)
ANTICOAGULATION CONSULT NOTE - Follow Up Consult  Pharmacy Consult for heparin >> apixiban Indication: pulmonary embolus  Labs: Recent Labs    03/13/18 1130 03/14/18 1932 03/15/18 0223  HGB 10.2* 9.4* 9.6*  HCT 33.3* 31.2* 30.2*  PLT 280.0 253 160  APTT  --  41*  --   LABPROT  --  14.0  --   INR  --  1.09  --   HEPARINUNFRC  --   --  0.28*  CREATININE 1.31 1.28*  --     Assessment: 62yo male slightly subtherapeutic on heparin with initial dosing for PE w/ plan to transition to Eliquis later this am.  Goal of Therapy:  Heparin level 0.3-0.7 units/ml   Plan:  Will give small heparin bolus of 2000 units and increase gtt by 2 units/kg/hr to 2100 units/hr until 10a when heparin will be turned off and Eliquis 10mg  PO BID x7d will be started followed by 5mg  PO BID, RN has been made aware to let day RN know of this transition and evening pharmacist spoke w/ pt about treatment plan.  Vernard GamblesVeronda Herma Uballe, PharmD, BCPS  03/15/2018,3:05 AM

## 2018-03-15 NOTE — Discharge Instructions (Signed)
Pulmonary Embolism A pulmonary embolism (PE) is a sudden blockage or decrease of blood flow in one lung or both lungs. Most blockages come from a blood clot that forms in a lower leg, thigh, or arm vein (deep vein thrombosis, DVT) and travels to the lungs. A clot is blood that has thickened into a gel or solid. PE is a dangerous and life-threatening condition that needs to be treated right away. What are the causes? This condition is usually caused by a blood clot that forms in a vein and moves to the lungs. In rare cases, it may be caused by air, fat, part of a tumor, or other tissue that moves through the veins and into the lungs. What increases the risk? The following factors may make you more likely to develop this condition:  Having DVT or a history of DVT.  Being older than age 60.  Personal or family history of blood clots or blood clotting disease.  Major or lengthy surgery.  Orthopedic surgery, especially hip or knee replacement.  Traumatic injury, such as breaking a hip or leg.  Spinal cord injury.  Stroke.  Taking medicines that contain estrogen. These include birth control pills and hormone replacement therapy.  Long-term (chronic) lung or heart disease.  Cancer and chemotherapy.  Having a central venous catheter.  Pregnancy and the period after delivery.  What are the signs or symptoms? Symptoms of this condition usually start suddenly and include:  Shortness of breath while active or at rest.  Coughing or coughing up blood or blood-tinged mucus.  Chest pain that is often worse with deep breaths.  Rapid or irregular heartbeat.  Feeling light-headed or dizzy.  Fainting.  Feeling anxious.  Sweating.  Pain and swelling in a leg. This is a symptom of DVT, which can lead to PE.  How is this diagnosed? This condition may be diagnosed based on:  Your medical history.  A physical exam.  Blood tests to check blood oxygen level and how well your blood  clots, and a D-dimer blood test, which checks your blood for a substance that is released when a blood clot breaks apart.  CT pulmonary angiogram. This test checks blood flow in and around your lungs.  Ventilation-perfusion scan, also called a lung VQ scan. This test measures air flow and blood flow to the lungs.  Ultrasound of the legs to look for blood clots.  How is this treated? Treatment for this conditions depends on many factors, such as the cause of your PE, your risk for bleeding or developing more clots, and other medical conditions you have. Treatment aims to remove, dissolve, or stop blood clots from forming or growing larger. Treatment may include:  Blood thinning medicines (anticoagulants) to stop clots from forming or growing. These medicines may be given as a pill, as an injection, or through an IV tube (infusion).  Medicines that dissolve clots (thrombolytics).  A procedure in which a flexible tube is used to remove a blood clot (embolectomy) or deliver medicine to destroy it (catheter-directed thrombolysis).  A procedure in which a filter is inserted into a large vein that carries blood to the heart (inferior vena cava). This filter (vena cava filter) catches blood clots before they reach the lungs.  Surgery to remove the clot (surgical embolectomy). This is rare.  You may need a combination of immediate, long-term (up to 3 months after diagnosis), and extended (more than 3 months after diagnosis) treatments. Your treatment may continue for several months (maintenance therapy).   You and your health care provider will work together to choose the treatment program that is best for you. Follow these instructions at home: If you are taking an anticoagulant medicine:  Take the medicine every day at the same time each day.  Understand what foods and drugs interact with your medicine.  Understand the side effects of this medicine, including excessive bruising or bleeding. Ask  your health care provider or pharmacist about other side effects. General instructions  Take over-the-counter and prescription medicines only as told by your health care provider.  Anticoagulant medicines may cause side effects, including easy bruising and difficulty stopping bleeding. If you are prescribed an anticoagulant: ? Hold pressure over cuts for longer than usual. ? Tell your dentist and other health care providers that you are taking anticoagulants before you have any procedure that may cause bleeding. ? Avoid contact sports. ? Be extra careful when handling sharp objects. ? Use a soft toothbrush. Floss with waxed dental floss. ? Shave with an electric razor.  Wear a medical alert bracelet or carry a medical alert card that says you have had a PE.  Ask your health care provider when you may return to your normal activities.  Talk with your health care provider about any travel plans. It is important to make sure that you are still able to take your medicine while on trips.  Keep all follow-up visits as told by your health care provider. This is important. How is this prevented? Take these actions to lower your risk of developing another PE:  Exercise regularly. Take frequent walks. For at least 30 minutes every day, engage in: ? Activity that involves moving your arms and legs. ? Activity that encourages good blood flow through your body by increasing your heart rate.  While traveling, drink plenty of water and avoid drinking alcohol. Ask your health care provider if you should wear below-the-knee compression stockings.  Avoid sitting or lying in bed for long periods of time without moving your legs. Exercise your arms and legs every hour during long-distance travel (over 4 hours).  If you are hospitalized or have surgery, ask your health care provider about your risks and what treatments can help prevent blood clots.  Maintain a healthy weight. Ask your health care  provider what weight is healthy for you.  If you are a woman who is over age 35, avoid unnecessary use of medicines that contain estrogen, including birth control pills.  Do not use any products that contain nicotine or tobacco, such as cigarettes and e-cigarettes. This is especially important if you take estrogen medicines. If you need help quitting, ask your health care provider.  See your health care provider for regular checkups. This may include blood tests and ultrasound testing on your legs to check for new blood clots.  Contact a health care provider if:  You missed a dose of your blood thinner medicine. Get help right away if:  You have new or increased pain, swelling, warmth, or redness in an arm or leg.  You have numbness or tingling in an arm or leg.  You have shortness of breath while active or at rest.  You have chest pain.  You have a rapid or irregular heartbeat.  You feel light-headed or dizzy.  You cough up blood.  You have blood in your vomit, stool, or urine.  You have a fever.  You have abdomen (abdominal) pain.  You have a severe fall or head injury.  You have a   severe headache.  You have vision changes.  You cannot move your arms or legs.  You are confused or have memory loss.  You are bleeding for 10 minutes or more, even with strong pressure on the wound. These symptoms may represent a serious problem that is an emergency. Do not wait to see if the symptoms will go away. Get medical help right away. Call your local emergency services (911 in the [place] Do not drive yourself to the hospital. Summary  A pulmonary embolism (PE) is a sudden blockage or decrease of blood flow in one lung or both lungs. PE is a dangerous and life-threatening condition that needs to be treated right away.  Having deep vein thrombosis (DVT) or a history of DVT is the most common risk factor for PE.  Treatments for this condition usually include medicines to thin  your blood (anticoagulants) or medicines to break apart blood clots (thrombolytics).  If you are prescribed blood thinners, it is important to take the medicine every single day at the same time each day.  If you have signs of PE or DVT, call your local emergency services (911 in the [place] This information is not intended to replace advice given to you by your health care provider. Make sure you discuss any questions you have with your health care provider. Document Released: 12/07/2000 Document Revised: 01/12/2017 Document Reviewed: 01/12/2017 Elsevier Interactive Patient Education  2018 [employer] Information on my medicine - ELIQUIS (apixaban)  This medication education was reviewed with me or my healthcare representative as part of my discharge preparation.    Why was Eliquis prescribed for you? Eliquis was prescribed to treat blood clots that may have been found in the veins of your legs (deep vein thrombosis) or in your lungs (pulmonary embolism) and to reduce the risk of them occurring again.  What do You need to know about Eliquis ? The starting dose is 10 mg (two 5 mg tablets) taken TWICE daily for the FIRST SEVEN (7) DAYS, then on (enter date)  03/22/18 the dose is reduced to ONE 5 mg tablet taken TWICE daily.  Eliquis may be taken with or without food.   Try to take the dose about the same time in the morning and in the evening. If you have difficulty swallowing the tablet whole please discuss with your pharmacist how to take the medication safely.  Take Eliquis exactly as prescribed and DO NOT stop taking Eliquis without talking to the doctor who prescribed the medication.  Stopping may increase your risk of developing a new blood clot.  Refill your prescription before you run out.  After discharge, you should have regular check-up appointments with your healthcare provider that is prescribing your Eliquis.    What do you do if you miss a dose? If a dose of ELIQUIS  is not taken at the scheduled time, take it as soon as possible on the same day and twice-daily administration should be resumed. The dose should not be doubled to make up for a missed dose.  Important Safety Information A possible side effect of Eliquis is bleeding. You should call your healthcare provider right away if you experience any of the following: ? Bleeding from an injury or your nose that does not stop. ? Unusual colored urine (red or dark brown) or unusual colored stools (red or black). ? Unusual bruising for unknown reasons. ? A serious fall or if you hit your head (even if there is no bleeding).  Some medicines  may interact with Eliquis and might increase your risk of bleeding or clotting while on Eliquis. To help avoid this, consult your healthcare provider or pharmacist prior to using any new prescription or non-prescription medications, including herbals, vitamins, non-steroidal anti-inflammatory drugs (NSAIDs) and supplements.  This website has more information on Eliquis (apixaban): http://www.eliquis.com/eliquis/home

## 2018-03-15 NOTE — Progress Notes (Signed)
  Echocardiogram 2D Echocardiogram has been performed.  Jeremy Douglas 03/15/2018, 1:40 PM

## 2018-03-15 NOTE — Discharge Summary (Signed)
Physician Discharge Summary  Jeremy Douglas ZOX:[id number] DOB: May 26, 1956 DOA: 03/14/2018  PCP: Darrow Bussing, MD  Admit date: 03/14/2018 Discharge date: 03/15/2018  Time spent: 45 minutes  Recommendations for Outpatient Follow-up:  Patient will be discharged to home.  Patient will need to follow up with primary care provider within one week of discharge.  Patient should continue medications as prescribed.  Patient should follow a heart healthy diet.   Discharge Diagnoses:  Acute pulmonary embolism Essential hypertension, uncontrolled Chronic microcytic anemia Chronic pain Tobacco abuse Obstructive sleep apnea Elevated creatinine  Discharge Condition: Stable  Diet recommendation: heart healthy  Filed Weights   03/14/18 1900  Weight: 124.7 kg (275 lb)    History of present illness:  On 03/14/2018 by Dr. Shonna Chock Jeremy Douglas is a 62 y.o. male with medical history significant for morbid obesity, smoking, osa on cpap, resistant hypertension, major depression, probable COPD, and anemia, who complains of approximately 3 months dyspnea on exertion. Denies hemoptysis. Denies LE swelling. Is not worsening, but also not improving. No history PE/DVT. No family history of such. Denies recent immobilization or hospitalization. Denies history GI bleed (but chart review reveals positive ifobts). Says has had a colonoscopy within past 10 years that had "mild polyp." alcohol use is sometimes. Says baseline hgb is 10 and has had anemia since he was a child. Is not aware of a diagnosis. Denies hematochezia/melena or other bleeding.    Hospital Course:  Acute pulmonary embolism -Unprovoked however patient has had symptoms for the last 2-3 months -CTA chest: Positive for acute PE right lower lobe with CT evidence of right heart strain consistent with at least submassive PE -Initially placed on IV heparin, and transition to Eliquis -Echocardiogram EF of 60-65% -Lower extremity Doppler: No  evidence of DVT, superficial thrombosis, or Baker's cyst  Essential hypertension, uncontrolled -Patient states his baseline systolic blood pressures approximately 160-180 and has missed his medications prior to admission.  -Continue home medications: lasix, clonidine, labetalol   Chronic microcytic anemia -States baseline hemoglobin is around 10 -hemoglobin currently 9.6 -Anemia panel shows an iron of 198, ferritin 219  Chronic pain -Continue home Percocet  Tobacco abuse -Discussed smoking cessation -Continue nicotine patch  Obstructive sleep apnea -Continue CPAP nightly  Elevated creatinine -Current 1.26, was last found normal patient's chart in 2015 -GFR greater than 60 -Repeat BMP in 1 week  Procedures: Echocardiogram Lower extremity Doppler  Consultations: None  Discharge Exam: Vitals:   03/15/18 1401 03/15/18 1525  BP: (!) 217/79 (!) 160/53  Pulse: 68   Resp: 18   Temp: 97.9 F (36.6 C)   SpO2: (!) 89% 92%   Patient seen and examined, currently denies any current chest pain, shortness of breath, abdominal pain, nausea or vomiting, diarrhea or constipation.  States that shortness of breath has been intermittent over the last several months.  Would like to go home today.   General: Well developed, well nourished, NAD, appears stated age  HEENT: NCAT, mucous membranes moist.  Neck: Supple  Cardiovascular: S1 S2 auscultated, RRR, no murmur   Respiratory: Clear to auscultation bilaterally with equal chest rise  Abdomen: Soft, obese, nontender, nondistended, + bowel sounds  Extremities: warm dry without cyanosis clubbing. Trace LE edema  Neuro: AAOx3, nonfocal  Skin: Without rashes exudates or nodules   Psych: Normal affect and demeanor with intact judgement and insight  Discharge Instructions Discharge Instructions    Discharge instructions   Complete by:  As directed    Patient will be  discharged to home.  Patient will need to follow up with  primary care provider within one week of discharge.  Patient should continue medications as prescribed.  Patient should follow a heart healthy diet.     Allergies as of 03/15/2018      Reactions   Amlodipine Other (See Comments)   Pt cannot tolerate 10mg       Medication List    STOP taking these medications   aspirin 81 MG tablet     TAKE these medications   cloNIDine 0.1 MG tablet Commonly known as:  CATAPRES Take 0.2 mg by mouth 2 (two) times daily.   ELIQUIS STARTER PACK 5 MG Tabs Take as directed on package: start with two-5mg  tablets twice daily for 7 days. On day 8, switch to one-5mg  tablet twice daily.   furosemide 40 MG tablet Commonly known as:  LASIX Take 40 mg by mouth daily.   labetalol 200 MG tablet Commonly known as:  NORMODYNE Take 400 mg by mouth 2 (two) times daily.   multivitamin with minerals Tabs tablet Take 1 tablet by mouth daily.   nicotine 7 mg/24hr patch Commonly known as:  NICODERM CQ - dosed in mg/24 hr Place 1 patch (7 mg total) onto the skin daily. Start taking on:  03/16/2018   oxyCODONE-acetaminophen 10-325 MG tablet Commonly known as:  PERCOCET Take 1 tablet by mouth 2 (two) times daily as needed for pain (knees).      Allergies  Allergen Reactions  . Amlodipine Other (See Comments)    Pt cannot tolerate 10mg     Follow-up Information    Koirala, Dibas, MD. Schedule an appointment as soon as possible for a visit in 1 week(s).   Specialty:  Family Medicine Why:  Hospital follow up Contact information: [address] [place] [address] [phone]            The results of significant diagnostics from this hospitalization (including imaging, microbiology, ancillary and laboratory) are listed below for reference.    Significant Diagnostic Studies: Dg Chest 2 View  Result Date: 03/13/2018 CLINICAL DATA:  Fall.  Shortness of breath. EXAM: CHEST - 2 VIEW COMPARISON:  04/20/2014. FINDINGS: Mediastinum  hilar structures normal. Cardiomegaly with mild bilateral interstitial prominence. Mild CHF may be present. Mild pneumonitis could present this fashion as well. No pleural effusion or pneumothorax. Diffuse degenerative change and osteopenia thoracic spine. No acute bony abnormality. IMPRESSION: Cardiomegaly with mild diffuse bilateral interstitial prominence. Mild CHF may be present. Mild pneumonitis could also present in this fashion. Electronically Signed   By: Maisie Fus  Register   On: 03/13/2018 17:00   Ct Angio Chest W/cm &/or Wo Cm  Addendum Date: 03/14/2018   ADDENDUM REPORT: 03/14/2018 18:25 ADDENDUM: These results were called by telephone at the time of interpretation on 03/14/2018 at 6:25 pm to Dr. Marchelle Gearing covering for NP Parrett , who verbally acknowledged these results. Electronically Signed   By: Tollie Eth M.D.   On: 03/14/2018 18:25   Result Date: 03/14/2018 CLINICAL DATA:  Dyspnea x1 week.  Positive D-dimer. EXAM: CT ANGIOGRAPHY CHEST WITH CONTRAST TECHNIQUE: Multidetector CT imaging of the chest was performed using the standard protocol during bolus administration of intravenous contrast. Multiplanar CT image reconstructions and MIPs were obtained to evaluate the vascular anatomy. CONTRAST:  ISOVUE-370 IOPAMIDOL (ISOVUE-370) INJECTION 76% COMPARISON:  None. FINDINGS: Cardiovascular: Small intraluminal filling defects within right lower lobe pulmonary segmental arterial branches. Findings are in keeping with acute pulmonary emboli. RV/LV ratio  is elevated to 1.17. Heart is top normal without pericardial effusion. Coronary arteriosclerosis is identified along the LAD and RCA. Mild aortic atherosclerosis at the arch without aneurysm. No dissection. Mediastinum/Nodes: Mildly enlarged mediastinal lymph nodes measuring up to 12 mm in the precarinal and AP window stations. Lungs/Pleura: Patchy ground-glass open UA shin of the lungs bilaterally which may represent areas of hypoventilatory change,  alveolitis/pneumonitis or stigmata of mild CHF. No effusion or pneumothorax. No pulmonary consolidation. No dominant mass. Mild pleural thickening is seen along the major fissures. Upper Abdomen: No acute abnormality. Musculoskeletal: No chest wall abnormality. Diffuse idiopathic skeletal hyperostosis is noted of the lower thoracic spine with flowing anterior osteophytes demonstrated. Multilevel mild degenerative disc space narrowing is seen. Review of the MIP images confirms the above findings. IMPRESSION: Positive for acute PE to the right lower lobe with CT evidence of right heart strain (RV/LV Ratio = 1.17) consistent with at least submassive (intermediate risk) PE. The presence of right heart strain has been associated with an increased risk of morbidity and mortality. These results will be called as requested to the ordering clinician or representative by the Radiologist Assistant, and communication documented in the PACS or zVision Dashboard. Coronary arteriosclerosis and aortic atherosclerosis. Aortic Atherosclerosis (ICD10-I70.0). Electronically Signed: By: Tollie Ethavid  Kwon M.D. On: 03/14/2018 17:37    Microbiology: No results found for this or any previous visit (from the past 240 hour(s)).   Labs: Basic Metabolic Panel: Recent Labs  Lab 03/13/18 1130 03/14/18 1932 03/15/18 0223  NA 142 137 138  K 5.0 3.9 3.9  CL 109 104 103  CO2 24 24 23   GLUCOSE 99 102* 110*  BUN 15 15 14   CREATININE 1.31 1.28* 1.26*  CALCIUM 10.2 9.2 8.9   Liver Function Tests: No results for input(s): AST, ALT, ALKPHOS, BILITOT, PROT, ALBUMIN in the last 168 hours. No results for input(s): LIPASE, AMYLASE in the last 168 hours. No results for input(s): AMMONIA in the last 168 hours. CBC: Recent Labs  Lab 03/13/18 1130 03/14/18 1932 03/15/18 0223  WBC 5.2 6.3 7.1  NEUTROABS 2.0 2.7  --   HGB 10.2* 9.4* 9.6*  HCT 33.3* 31.2* 30.2*  MCV 64.7 Repeated and verified X2.* 65.4* 65.2*  PLT 280.0 253 160    Cardiac Enzymes: No results for input(s): CKTOTAL, CKMB, CKMBINDEX, TROPONINI in the last 168 hours. BNP: BNP (last 3 results) No results for input(s): BNP in the last 8760 hours.  ProBNP (last 3 results) Recent Labs    03/13/18 1130  PROBNP 171.0*    CBG: No results for input(s): GLUCAP in the last 168 hours.     Signed:  Edsel PetrinMaryann Jasmen Emrich  [facility] 03/15/2018, 4:46 PM

## 2018-03-15 NOTE — Progress Notes (Signed)
Pt given discharge instructions, prescriptions, and care notes. Pt verbalized understanding AEB no further questions or concerns at this time. IV was discontinued, no redness, pain, or swelling noted at this time. Telemetry discontinued and Centralized Telemetry was notified. Pt left the floor via wheelchair with staff in stable condition. 

## 2018-03-15 NOTE — Progress Notes (Signed)
Pt BP 198/68 one hour after receiving one time dose of vasotec 1.25 mg. Notified Blount, NP. No new orders at this time, will continue to monitor.

## 2018-03-17 ENCOUNTER — Telehealth: Payer: Self-pay | Admitting: Internal Medicine

## 2018-03-17 NOTE — Telephone Encounter (Signed)
lmtcb x1 for pt. 

## 2018-03-18 LAB — HEMOGLOBINOPATHY EVALUATION
HGB A2 QUANT: 4.6 % — AB (ref 1.8–3.2)
HGB A: 94.1 % — AB (ref 96.4–98.8)
HGB C: 0 %
HGB F QUANT: 1.3 % (ref 0.0–2.0)
Hgb S Quant: 0 %
Hgb Variant: 0 %

## 2018-03-18 NOTE — Telephone Encounter (Signed)
Pt is calling back [phone]

## 2018-03-18 NOTE — Telephone Encounter (Signed)
Spoke with patient. He had several questions about his embolism. He wanted to know how long would he be taking the Eliquis. He also wanted to know which lung the embolism was found in.   He also had a question about the percocet. He stated that he is currently seeing Michael LitterPamela Matusik at [facility]. In his opinion, the percocet is helping him breathing better but he is only allowed to take 2 tablets a day. He wanted to know if CY could talk to Rinaldo Cloudamela to increase the frequency to 3 times a day.   CY, please advise. Thanks!

## 2018-03-18 NOTE — Telephone Encounter (Addendum)
lmtcb x2 for pt. 

## 2018-03-18 NOTE — Telephone Encounter (Signed)
I called Jeremy Orvan FalconerCampbell and addressed his questions. He has no prior personal or family hx of VTE. Doing ok and following directions on Eliquis. He will keep f/u appt here as scheduled, calling earlier as needed. I explained percocet blunts/ hides dyspnea, but doesn't improve heart/ lung function. He will continue managing for pain with his pain-clinic provider.

## 2018-03-21 ENCOUNTER — Encounter: Payer: Self-pay | Admitting: Obstetrics and Gynecology

## 2018-03-21 DIAGNOSIS — D563 Thalassemia minor: Secondary | ICD-10-CM | POA: Insufficient documentation

## 2018-03-25 DIAGNOSIS — M1710 Unilateral primary osteoarthritis, unspecified knee: Secondary | ICD-10-CM | POA: Diagnosis not present

## 2018-03-25 DIAGNOSIS — Z79899 Other long term (current) drug therapy: Secondary | ICD-10-CM | POA: Diagnosis not present

## 2018-03-25 DIAGNOSIS — M25569 Pain in unspecified knee: Secondary | ICD-10-CM | POA: Diagnosis not present

## 2018-03-25 DIAGNOSIS — G894 Chronic pain syndrome: Secondary | ICD-10-CM | POA: Diagnosis not present

## 2018-03-25 DIAGNOSIS — Z79891 Long term (current) use of opiate analgesic: Secondary | ICD-10-CM | POA: Diagnosis not present

## 2018-04-02 ENCOUNTER — Telehealth: Payer: Self-pay | Admitting: Internal Medicine

## 2018-04-02 NOTE — Telephone Encounter (Signed)
Left message for patient to call back  

## 2018-04-02 NOTE — Telephone Encounter (Signed)
Spoke with pt. States that he has some dental procedures coming up on 04/09/18. His periodontist wanted the pt to check with us about his Eliquis. The periodontist thinks that since he was just started on Eliquis that he should not go a few days without taking it. Pt is wanting Dr. Roxy CedarYoung's advice on what to do. Should he cancel the procedure? Can he go a few days without Eliquis?  Dr. Maple HudsonYoung - please advise. Thanks.

## 2018-04-02 NOTE — Telephone Encounter (Signed)
Pt is returning call. Cb is [phone].  

## 2018-04-02 NOTE — Telephone Encounter (Signed)
There is no clear guideline, but at a MINIMUM, he should wait on the periodontal procedure until he has been on Eliquis at least 6 weeks. Time off Eliquis should be minimized.

## 2018-04-02 NOTE — Telephone Encounter (Signed)
Pt is returning call. Cb is [phone].

## 2018-04-02 NOTE — Telephone Encounter (Signed)
Attempted to call patient, no answer, left message for patient to call back.  

## 2018-04-02 NOTE — Telephone Encounter (Signed)
Spoke with patient. He is aware of CY's recs. Stated that he will hold off on procedure.   Nothing else needed at time of call.

## 2018-04-09 DIAGNOSIS — I2699 Other pulmonary embolism without acute cor pulmonale: Secondary | ICD-10-CM | POA: Diagnosis not present

## 2018-04-09 DIAGNOSIS — D649 Anemia, unspecified: Secondary | ICD-10-CM | POA: Diagnosis not present

## 2018-04-09 DIAGNOSIS — I10 Essential (primary) hypertension: Secondary | ICD-10-CM | POA: Diagnosis not present

## 2018-04-10 ENCOUNTER — Telehealth: Payer: Self-pay | Admitting: Internal Medicine

## 2018-04-10 MED ORDER — APIXABAN 5 MG PO TABS: 5.0000 mg | ORAL_TABLET | Freq: Two times a day (BID) | ORAL | 1 refills | Status: DC

## 2018-04-10 NOTE — Telephone Encounter (Signed)
Patient was given eliquis during his time at the hospital. Patient will run out of the medication on Sunday this weekend. Patient has seen a cardiologist in the past but it has been two years.   CY please advise can we refill this medication. Thanks.    Current Outpatient Medications on File Prior to Visit  Medication Sig Dispense Refill  . cloNIDine (CATAPRES) 0.1 MG tablet Take 0.2 mg by mouth 2 (two) times daily.     Marland Kitchen. ELIQUIS STARTER PACK (ELIQUIS STARTER PACK) 5 MG TABS Take as directed on package: start with two-5mg  tablets twice daily for 7 days. On day 8, switch to one-5mg  tablet twice daily. 1 each 0  . furosemide (LASIX) 40 MG tablet Take 40 mg by mouth daily.    Marland Kitchen. labetalol (NORMODYNE) 200 MG tablet Take 400 mg by mouth 2 (two) times daily.     . Multiple Vitamin (MULITIVITAMIN WITH MINERALS) TABS Take 1 tablet by mouth daily.     . nicotine (NICODERM CQ - DOSED IN MG/24 HR) 7 mg/24hr patch Place 1 patch (7 mg total) onto the skin daily. 28 patch 0  . oxyCODONE-acetaminophen (PERCOCET) 10-325 MG tablet Take 1 tablet by mouth 2 (two) times daily as needed for pain (knees).      No current facility-administered medications on file prior to visit.    Allergies  Allergen Reactions  . Amlodipine Other (See Comments)    Pt cannot tolerate 10mg 

## 2018-04-10 NOTE — Telephone Encounter (Signed)
Called and spoke with pt letting him know we were going to send a script of eliquis to his pharmacy per CY.  Pt expressed understanding. Verified pt's pharmacy and sent script of eliquis.  Nothing further needed at this time.

## 2018-04-10 NOTE — Telephone Encounter (Signed)
Ok to refill 

## 2018-04-22 DIAGNOSIS — M1710 Unilateral primary osteoarthritis, unspecified knee: Secondary | ICD-10-CM | POA: Diagnosis not present

## 2018-04-22 DIAGNOSIS — G894 Chronic pain syndrome: Secondary | ICD-10-CM | POA: Diagnosis not present

## 2018-04-22 DIAGNOSIS — Z79899 Other long term (current) drug therapy: Secondary | ICD-10-CM | POA: Diagnosis not present

## 2018-04-22 DIAGNOSIS — M545 Low back pain: Secondary | ICD-10-CM | POA: Diagnosis not present

## 2018-04-22 DIAGNOSIS — M549 Dorsalgia, unspecified: Secondary | ICD-10-CM | POA: Diagnosis not present

## 2018-04-22 DIAGNOSIS — Z79891 Long term (current) use of opiate analgesic: Secondary | ICD-10-CM | POA: Diagnosis not present

## 2018-04-23 DIAGNOSIS — I10 Essential (primary) hypertension: Secondary | ICD-10-CM | POA: Diagnosis not present

## 2018-04-24 ENCOUNTER — Ambulatory Visit: Payer: BLUE CROSS/BLUE SHIELD | Admitting: Internal Medicine

## 2018-04-27 ENCOUNTER — Emergency Department (HOSPITAL_COMMUNITY): Payer: BLUE CROSS/BLUE SHIELD

## 2018-04-27 ENCOUNTER — Emergency Department (HOSPITAL_COMMUNITY)
Admission: EM | Admit: 2018-04-27 | Discharge: 2018-04-27 | Disposition: A | Discharge status: Home / Self Care | Payer: BLUE CROSS/BLUE SHIELD | Attending: Emergency Medicine | Admitting: Emergency Medicine

## 2018-04-27 ENCOUNTER — Encounter (HOSPITAL_COMMUNITY): Payer: Self-pay

## 2018-04-27 DIAGNOSIS — Z79899 Other long term (current) drug therapy: Secondary | ICD-10-CM | POA: Insufficient documentation

## 2018-04-27 DIAGNOSIS — Z87891 Personal history of nicotine dependence: Secondary | ICD-10-CM | POA: Insufficient documentation

## 2018-04-27 DIAGNOSIS — J449 Chronic obstructive pulmonary disease, unspecified: Secondary | ICD-10-CM | POA: Insufficient documentation

## 2018-04-27 DIAGNOSIS — F10230 Alcohol dependence with withdrawal, uncomplicated: Secondary | ICD-10-CM

## 2018-04-27 DIAGNOSIS — I10 Essential (primary) hypertension: Secondary | ICD-10-CM | POA: Insufficient documentation

## 2018-04-27 DIAGNOSIS — R0602 Shortness of breath: Secondary | ICD-10-CM | POA: Insufficient documentation

## 2018-04-27 DIAGNOSIS — Z96651 Presence of right artificial knee joint: Secondary | ICD-10-CM | POA: Insufficient documentation

## 2018-04-27 DIAGNOSIS — F10239 Alcohol dependence with withdrawal, unspecified: Secondary | ICD-10-CM | POA: Insufficient documentation

## 2018-04-27 DIAGNOSIS — R079 Chest pain, unspecified: Secondary | ICD-10-CM | POA: Diagnosis not present

## 2018-04-27 HISTORY — DX: Other pulmonary embolism without acute cor pulmonale: I26.99

## 2018-04-27 LAB — BASIC METABOLIC PANEL
ANION GAP: 13 (ref 5–15)
BUN: 23 mg/dL — ABNORMAL HIGH (ref 6–20)
CHLORIDE: 110 mmol/L (ref 101–111)
CO2: 19 mmol/L — AB (ref 22–32)
Calcium: 8.1 mg/dL — ABNORMAL LOW (ref 8.9–10.3)
Creatinine, Ser: 1.23 mg/dL (ref 0.61–1.24)
GFR calc Af Amer: >60 mL/min (ref >60)
GLUCOSE: 97 mg/dL (ref 65–99)
POTASSIUM: 4.4 mmol/L (ref 3.5–5.1)
Sodium: 142 mmol/L (ref 135–145)

## 2018-04-27 LAB — I-STAT TROPONIN, ED: Troponin i, poc: 0.02 ng/mL (ref 0.00–0.08)

## 2018-04-27 LAB — CBC
HEMATOCRIT: 36.9 % — AB (ref 39.0–52.0)
HEMOGLOBIN: 12 g/dL — AB (ref 13.0–17.0)
MCH: 20.5 pg — AB (ref 26.0–34.0)
MCHC: 32.5 g/dL (ref 30.0–36.0)
MCV: 63 fL — AB (ref 78.0–100.0)
Platelets: 172 10*3/uL (ref 150–400)
RBC: 5.86 MIL/uL — ABNORMAL HIGH (ref 4.22–5.81)
RDW: 16.2 % — ABNORMAL HIGH (ref 11.5–15.5)
WBC: 9.2 10*3/uL (ref 4.0–10.5)

## 2018-04-27 MED ORDER — ONDANSETRON HCL 4 MG/2ML IJ SOLN
4.0000 mg | Freq: Once | INTRAMUSCULAR | Status: AC
  Administered 2018-04-27: 4 mg via INTRAVENOUS
  Filled 2018-04-27: qty 2

## 2018-04-27 MED ORDER — CHLORDIAZEPOXIDE HCL 25 MG PO CAPS: ORAL_CAPSULE | ORAL | 0 refills | Status: DC

## 2018-04-27 MED ORDER — OXYCODONE-ACETAMINOPHEN 5-325 MG PO TABS
2.0000 | ORAL_TABLET | Freq: Once | ORAL | Status: AC
  Administered 2018-04-27: 2 via ORAL
  Filled 2018-04-27: qty 2

## 2018-04-27 MED ORDER — CHLORDIAZEPOXIDE HCL 25 MG PO CAPS
50.0000 mg | ORAL_CAPSULE | Freq: Once | ORAL | Status: AC
  Administered 2018-04-27: 50 mg via ORAL
  Filled 2018-04-27: qty 2

## 2018-04-27 MED ORDER — LORAZEPAM 2 MG/ML IJ SOLN
1.0000 mg | Freq: Once | INTRAMUSCULAR | Status: AC
  Administered 2018-04-27: 1 mg via INTRAVENOUS
  Filled 2018-04-27: qty 1

## 2018-04-27 MED ORDER — IOPAMIDOL (ISOVUE-370) INJECTION 76%
INTRAVENOUS | Status: AC
  Administered 2018-04-27: 100 mL
  Filled 2018-04-27: qty 100

## 2018-04-27 NOTE — ED Triage Notes (Signed)
PT returned from [facility]

## 2018-04-27 NOTE — ED Triage Notes (Signed)
PT reports Chronic back pain and was unable to take pain meds before coming to Hospital.

## 2018-04-27 NOTE — ED Notes (Signed)
Declined W/C at D/C and was escorted to lobby by RN. 

## 2018-04-27 NOTE — ED Triage Notes (Signed)
PT ambulated in hall with out assistance. Pt tol. well 

## 2018-04-27 NOTE — ED Provider Notes (Signed)
[facility] EMERGENCY DEPARTMENT Provider Note   CSN: [id number] Arrival date & time: 04/27/18  4098     History   Chief Complaint Chief Complaint  Patient presents with  . Shortness of Breath    HPI JERAL ZICK is a 62 y.o. male.  He presents complaining of acute shortness of breath that started yesterday.  Associated with some pressure in his chest.  He was diagnosed with a PE in late March that he said he really did not have much for symptoms other than minimal shortness of breath.  Is been taking Eliquis twice a day since then.  He states he has a pulmonologist but it is only for CPAP and he has no specific lung disease.  There is been no cough no sputum production no fever.  No nausea vomiting diarrhea.  States he feels worse laying down flat and improved with sitting up.  He has no allergy troubles and no nasal congestion.  The history is provided by the patient.  Shortness of Breath  This is a new problem. The average episode lasts 2 days. The problem has not changed since onset.Associated symptoms include PND, orthopnea and chest pain. Pertinent negatives include no fever, no coryza, no rhinorrhea, no sore throat, no ear pain, no cough, no sputum production, no hemoptysis, no wheezing, no syncope, no vomiting, no abdominal pain, no rash, no leg pain and no leg swelling. He has tried nothing for the symptoms. The treatment provided no relief. He has had prior hospitalizations. Associated medical issues include PE.    Past Medical History:  Diagnosis Date  . Alcohol dependence (HCC) 09/07/2012  . Anemia   . Esophagitis 09/07/2012   Per EGD 04/2011  . Gastritis 09/07/2012   Per EGD 04/2011  . Headache    4 - 5 a yr (no migraines)  . HTN (hypertension) 09/07/2012  . Hypertension    takes Amlodipine,Labetalol,and Clonidine daily  . Ischemic chest pain   . Obesity   . Pulmonary emboli (HCC)   . Sleep apnea    pt states he is on bipap at night for sleep apnea   . Thrombocytopenia (HCC) 09/07/2012   Hx of in past.  . Tobacco abuse     Patient Active Problem List   Diagnosis Date Noted  . Beta thalassemia minor 03/21/2018  . Shortness of breath 03/14/2018  . Pulmonary embolism (HCC) 03/14/2018  . Chronic pain 03/14/2018  . Microcytic anemia 03/14/2018  . COPD (chronic obstructive pulmonary disease) (HCC) 03/14/2018  . Primary osteoarthritis of right knee 10/15/2014  . Osteoarthritis of left knee 03/19/2014  . Morbid obesity (HCC) 03/19/2014  . Anemia 09/09/2012  . Depression 09/09/2012  . Transaminitis 09/08/2012  . Tylenol overdose 09/07/2012  . Suicidal overdose (HCC) 09/07/2012  . Hypertensive urgency 09/07/2012  . Hypokalemia 09/07/2012  . Alcohol dependence (HCC) 09/07/2012  . Gastritis 09/07/2012  . Esophagitis 09/07/2012  . Tobacco use disorder, moderate, in sustained remission 09/10/2008  . Essential hypertension 09/10/2008  . Obstructive sleep apnea 09/10/2008    Past Surgical History:  Procedure Laterality Date  . COLONOSCOPY    . KNEE ARTHROSCOPY Left 09/18/2013   Procedure: ARTHROSCOPY KNEE, PARTIAL MEDIAL AND LATERAL MENISCECTOMY, CHONDROPLASTY PATELLA FEMORAL JOINT;  Surgeon: Harvie Junior, MD;  Location: [facility];  Service: Orthopedics;  Laterality: Left;  . Rigth Knee Arthroscopy  06/20/2011  . TONSILLECTOMY     as a child  . TOTAL KNEE ARTHROPLASTY Left 03/19/2014   Procedure:  LEFT TOTAL KNEE ARTHROPLASTY;  Surgeon: Harvie Junior, MD;  Location: [facility] OR;  Service: Orthopedics;  Laterality: Left;  . TOTAL KNEE ARTHROPLASTY Right 10/15/2014   Procedure: RIGHT TOTAL KNEE ARTHROPLASTY;  Surgeon: Harvie Junior, MD;  Location: [facility] OR;  Service: Orthopedics;  Laterality: Right;  . UPPER GI ENDOSCOPY          Home Medications    Prior to Admission medications   Medication Sig Start Date End Date Taking? Authorizing Provider  apixaban (ELIQUIS) 5 MG TABS tablet Take 1 tablet (5 mg total) by mouth 2  (two) times daily. 04/10/18   Jetty Duhamel D, MD  cloNIDine (CATAPRES) 0.1 MG tablet Take 0.2 mg by mouth 2 (two) times daily.     [provider]  furosemide (LASIX) 40 MG tablet Take 40 mg by mouth daily.    [provider]  labetalol (NORMODYNE) 200 MG tablet Take 400 mg by mouth 2 (two) times daily.     [provider]  Multiple Vitamin (MULITIVITAMIN WITH MINERALS) TABS Take 1 tablet by mouth daily.     [provider]  nicotine (NICODERM CQ - DOSED IN MG/24 HR) 7 mg/24hr patch Place 1 patch (7 mg total) onto the skin daily. 03/16/18   Mikhail, Nita Sells, DO  oxyCODONE-acetaminophen (PERCOCET) 10-325 MG tablet Take 1 tablet by mouth 2 (two) times daily as needed for pain (knees).     [provider]    Family History No family history on file.  Social History Social History   Tobacco Use  . Smoking status: Former Smoker    Packs/day: 0.80    Years: 31.00    Pack years: 24.80    Types: Cigarettes    Last attempt to quit: 03/14/2013    Years since quitting: 5.1  . Smokeless tobacco: Never Used  Substance Use Topics  . Alcohol use: Yes    Comment: -hx abuse      not drank X 10 mos  . Drug use: No     Allergies   Amlodipine   Review of Systems Review of Systems  Constitutional: Negative for chills and fever.  HENT: Negative for ear pain, rhinorrhea and sore throat.   Eyes: Negative for pain and visual disturbance.  Respiratory: Positive for shortness of breath. Negative for cough, hemoptysis, sputum production and wheezing.   Cardiovascular: Positive for chest pain, orthopnea and PND. Negative for palpitations, leg swelling and syncope.  Gastrointestinal: Negative for abdominal pain and vomiting.  Genitourinary: Negative for dysuria and hematuria.  Musculoskeletal: Positive for arthralgias. Negative for back pain.  Skin: Negative for color change and rash.  Neurological: Negative for seizures and syncope.  All other systems  reviewed and are negative.    Physical Exam Updated Vital Signs BP (!) 173/66 (BP Location: Right Arm)   Pulse 60   Temp 98.6 F (37 C) (Oral)   Resp 16   SpO2 98%   Physical Exam  Constitutional: He appears well-developed and well-nourished.  HENT:  Head: Normocephalic and atraumatic.  Eyes: Conjunctivae are normal.  Neck: Neck supple.  Cardiovascular: Normal rate and regular rhythm.  No murmur heard. Pulmonary/Chest: Effort normal and breath sounds normal. No respiratory distress.  Abdominal: Soft. There is no tenderness.  Musculoskeletal: Normal range of motion. He exhibits edema (symmetric bilat le). He exhibits no deformity.  Neurological: He is alert. He displays tremor. GCS eye subscore is 4. GCS verbal subscore is 5. GCS motor subscore is 6.  Skin: Skin is  warm and dry.  Psychiatric: His mood appears anxious.  Nursing note and vitals reviewed.    ED Treatments / Results  Labs (all labs ordered are listed, but only abnormal results are displayed) Labs Reviewed  BASIC METABOLIC PANEL - Abnormal; Notable for the following components:      Result Value   CO2 19 (*)    BUN 23 (*)    Calcium 8.1 (*)    All other components within normal limits  CBC - Abnormal; Notable for the following components:   RBC 5.86 (*)    Hemoglobin 12.0 (*)    HCT 36.9 (*)    MCV 63.0 (*)    MCH 20.5 (*)    RDW 16.2 (*)    All other components within normal limits  I-STAT TROPONIN, ED    EKG EKG Interpretation  Date/Time:  Sunday Apr 27 2018 06:38:04 EDT Ventricular Rate:  61 PR Interval:    QRS Duration: 108 QT Interval:  469 QTC Calculation: 473 R Axis:   -23 Text Interpretation:  Sinus rhythm Borderline left axis deviation RSR' in V1 or V2, probably normal variant No significant change was found Confirmed by Glynn Octave [phone]) on 04/27/2018 6:56:04 AM   Radiology Dg Chest 2 View  Result Date: 04/27/2018 CLINICAL DATA:  Chest pain EXAM: CHEST - 2 VIEW COMPARISON:   03/13/2018 chest radiograph. FINDINGS: Stable cardiomediastinal silhouette with normal heart size. No pneumothorax. No pleural effusion. Stable mild right basilar scarring. No pulmonary edema. No acute consolidative airspace disease. IMPRESSION: No active cardiopulmonary disease. Electronically Signed   By: Delbert Phenix M.D.   On: 04/27/2018 07:49   Ct Angio Chest Pe W/cm &/or Wo Cm  Result Date: 04/27/2018 CLINICAL DATA:  Acute shortness of breath with chest pressure and history prior pulmonary embolism in March. EXAM: CT ANGIOGRAPHY CHEST WITH CONTRAST TECHNIQUE: Multidetector CT imaging of the chest was performed using the standard protocol during bolus administration of intravenous contrast. Multiplanar CT image reconstructions and MIPs were obtained to evaluate the vascular anatomy. CONTRAST:  ISOVUE-370 IOPAMIDOL (ISOVUE-370) INJECTION 76% COMPARISON:  03/14/2018 FINDINGS: Cardiovascular: No evidence of pulmonary embolism. Previously noted nonocclusive thrombus in right lower lobe pulmonary artery branches has completely resolved. Central pulmonary arteries are normal in caliber. The heart size is normal. There is evidence of probable left ventricular hypertrophy. No right-sided chamber dilatation. No pericardial fluid. The thoracic aorta is normal in caliber. Mediastinum/Nodes: No enlarged mediastinal, hilar, or axillary lymph nodes. Thyroid gland, trachea, and esophagus demonstrate no significant findings. Lungs/Pleura: There is no evidence of pulmonary edema, consolidation, pneumothorax, nodule or pleural fluid. Upper Abdomen: No acute abnormality. Musculoskeletal: Stable degenerative disease of the thoracic spine. Review of the MIP images confirms the above findings. IMPRESSION: No acute findings in the chest. Complete resolution right lower lobe pulmonary emboli since the prior CTA. Probable underlying left ventricular hypertrophy. Electronically Signed   By: Irish Lack M.D.   On: 04/27/2018  09:56    Procedures Procedures (including critical care time)  Medications Ordered in ED Medications - No data to display   Initial Impression / Assessment and Plan / ED Course  I have reviewed the triage vital signs and the nursing notes.  Pertinent labs & imaging results that were available during my care of the patient were reviewed by me and considered in my medical decision making (see chart for details).  Clinical Course as of Apr 28 758  Sun Apr 27, 2018  1469 62 year old male with recent diagnosis  of PE on Xarelto here with acute onset of shortness of breath.  Currently he appears anxious and slightly tremulous.  His lungs are clear.  We will check troponin chest x-ray but likely will need another CT PE to make sure he did have a new clot on anticoagulation.   [MB]  1000 CTPA negative for acute PE.  Does demonstrate improved resolution of the prior PE.   [MB]  1147 Reevaluated patient after Ativan.  He is slightly less tremulous.  He does admit that he is probably in some alcohol withdrawal and I feel he at least needs another dose of medication.  Will reassess after that.   [MB]  1423 Patient ambulated here and did well.  We will send him home with Librium and encouraged him to consider detox.   [MB]    Clinical Course User Index [MB] Terrilee Files, MD     Final Clinical Impressions(s) / ED Diagnoses   Final diagnoses:  SOB (shortness of breath)  Alcohol dependence with uncomplicated withdrawal [facility])    ED Discharge Orders        Ordered    chlordiazePOXIDE (LIBRIUM) 25 MG capsule     04/27/18 1425       Terrilee Files, MD 04/28/18 [phone]

## 2018-04-27 NOTE — Discharge Instructions (Signed)
Your evaluated in the emergency department for shortness of breath.  We repeated your CAT scan and showed no evidence of blood clot.  He should continue your regular medicines.  You were also very shaky while you were here and I think this is partly alcohol withdrawal.  We are prescribing you some medication to take that would help with withdrawal symptoms if you want to stop drinking.  I also recommend that you contact detox.  If you have any worsening symptoms he should return to the emergency department.

## 2018-04-27 NOTE — ED Triage Notes (Signed)
Pt comes from home via [facility] for SOB that has been going on for a while and worse the past few hours along with CP on the L side, recent PE diagnoses on 3/22.

## 2018-04-27 NOTE — ED Notes (Addendum)
Pt o2 @ 98 on 3 L. Pt o2 removed and o2 decreased to 92. Pt unable to stand. Pt jittery and dizzy and almost fell @ bedside. Pt states that he drank a lot last night and cannot stop shaking. Pt unable to ambulate safely down hallway to monitor o2 levels @ this time.

## 2018-04-27 NOTE — ED Notes (Signed)
Pt given water per RN 

## 2018-04-30 ENCOUNTER — Encounter: Payer: Self-pay | Admitting: Acute Care

## 2018-04-30 ENCOUNTER — Telehealth: Payer: Self-pay

## 2018-04-30 ENCOUNTER — Ambulatory Visit: Payer: BLUE CROSS/BLUE SHIELD | Admitting: Acute Care

## 2018-04-30 VITALS — BP 190/96 | HR 55 | Ht 71.0 in | Wt 269.2 lb

## 2018-04-30 DIAGNOSIS — J439 Emphysema, unspecified: Secondary | ICD-10-CM

## 2018-04-30 DIAGNOSIS — I2699 Other pulmonary embolism without acute cor pulmonale: Secondary | ICD-10-CM | POA: Diagnosis not present

## 2018-04-30 DIAGNOSIS — R0602 Shortness of breath: Secondary | ICD-10-CM | POA: Diagnosis not present

## 2018-04-30 DIAGNOSIS — G4733 Obstructive sleep apnea (adult) (pediatric): Secondary | ICD-10-CM | POA: Diagnosis not present

## 2018-04-30 MED ORDER — BUDESONIDE-FORMOTEROL FUMARATE 160-4.5 MCG/ACT IN AERO: 2.0000 | INHALATION_SPRAY | Freq: Two times a day (BID) | RESPIRATORY_TRACT | 0 refills | Status: DC

## 2018-04-30 NOTE — Assessment & Plan Note (Signed)
Resolving per repeat CTA done 04/28/2018 ( ED) Compliant with Eliquis Plan: Please follow up with PCP about BP  Continue Eliquis 5 mg twice daily It is important not to miss any doses of medication. Make sure you order new medication when you get to a 7 tablets in your bottle, as it may take a few days to get the medication filled.  Follow up with Dr. Maple Douglas in 1 month to ensure you are doing well. Bleeding Precautions  1 Do not double up dose if you accidentally miss a dose of medication. 2 Practice care with exercise or activities.  Even a small cut will; bleed.  Hold pressure until bleeding stops, seek emergency care if bleeding does not stop. 3 Wear gloves while using sharp objects like scissors or knives. 4 Switch to an Neurosurgeon . 5 Wear shoes at all times when mowing lawns or walking in the garden. 6 Use a soft toothbrush and waxed dental floss. 7 Wear a helmet when biking or skiing, or anytime a fall could cause head trauma. 8 No high impact contact sports like football. 9 Wear a medic alert bracelet to identify herself as someone who is on blood thinners when you cannot speak for yourself. 10 If you fall, or get hit hard go to the doctor or hospital.  Even if there is no blood a bruise could mean bleeding under the skin or anywhere in your body.  A head injury can cause bleeding under your skull. 11 No over-the-counter medications, vitamins, or supplements unless you check with your physician first for interactions.  Aspirin, ibuprofen, naproxen, and Pepto-Bismol all have interactions that can cause increased bleeding. 12 Ask your doctor if it is okay to have alcohol.  Alcohol is metabolized by your liver which produces clotting factors and can have a direct impact on how well your blood thinner works.   Follow up with Dr. Maple Douglas   In 1 month  or before as needed.  Length of treatment with Eliquis will be at least 6 months. You will need a repeat echo of your heart in Sept. 2019To  ensure normal cardiac function

## 2018-04-30 NOTE — Telephone Encounter (Signed)
Pt was seen today by SG and stated he wanted to switch from [facility] to [facility]. Call [facility] in the morning to speak to someone about his settings, his SD card was from 2015. Please call in the am.

## 2018-04-30 NOTE — Progress Notes (Signed)
History of Present Illness Jeremy Douglas is a 62 y.o. male with morbid obesity, smoking, osa on cpap, resistant hypertension, major depression, probable COPD, and anemia. He is followed by Dr. Maple Hudson for sleep apnea.   04/30/2018  6 week follow up per Dr. Maple Hudson. Pt. Was seen by Dr. Maple Hudson 03/13/2018 for worsening back pain and dyspnea x 3 months.. D-dimer was +, and Pt was diagnosed with an unprovoked  PE  Per CTA on 03/14/18  . He was treated at [facility] 3/22-3/23. He was treated with heparin and transitioned to and discharged on Eliquis. He was seen again in the ED on 04/28/2018 for worsening shortness of breath and associated chest pressure. CTA was repeated and showed resolution of original PE. Troponins were negative and CXR was clear.  He admitted to alcohol withdrawal and asked for additional ativan from the ED staff. He was started on Lithium and encouraged to seek  detox as an outpatient. He was told to quit smoking. He is seen at the pain clinic , and called the office to request additional percocet for his chronic back pain from Dr. Maple Hudson on 03/17/2018. Dr. Maple Hudson encouraged the patient to go through the pain clinic for all pain medications.  Pt. Returns today for follow up. He Is taking Eliquis 5 mg twice daily.Creatinine 04/27/2017 was 1.23, HGB 12, Platelets 172,000. He states he has been compliant with his eliquis. No s/s of bleeding. His breathing is better. He continues to smoke. His BP is very high despite triple therapy which he states he is compliant with and  using.all his meds He has an appointment with his PCP tomorrow morning at 10:15 to address his BP. Pt. Still has some shortness of breath,  sats are  97% today in the office.Marland Kitchen He is compliant with his CPAP. He states he even wears it with his naps.  He has an alarm on his phone to remind him to take his medication. He states he has adequate supply of Eliquis. We discussed the importance of not missing any dosages. He verbalized understanding.  He has had no issues with bleeding or bruising.   Test Results: 04/28/2018>> CTA No acute findings in the chest. Complete resolution right lower lobe pulmonary emboli since the prior CTA. Probable underlying left ventricular hypertrophy.  Echo 03/14/2018 Left ventricle: The cavity size was normal. Wall thickness was   increased in a pattern of mild LVH. Systolic function was normal.   The estimated ejection fraction was in the range of 60% to 65%.   Doppler parameters are consistent with both elevated ventricular   end-diastolic filling pressure and elevated left atrial filling   pressure. - Aortic valve: There was mild stenosis. Valve area (VTI): 3.4   cm^2. Valve area (Vmax): 3.04 cm^2. Valve area (Vmean): 2.96   cm^2. - Mitral valve: Calcified annulus. There was mild regurgitation. - Left atrium: The atrium was moderately dilated. - Right atrium: The atrium was mildly dilated. - Atrial septum: No defect or patent foramen ovale was identified.   03/14/2018>> CTA IMPRESSION: Positive for acute PE to the right lower lobe with CT evidence of right heart strain (RV/LV Ratio = 1.17) consistent with at least submassive (intermediate risk) PE. The presence of right heart strain has been associated with an increased risk of morbidity and mortality.   03/13/2018: Spirometry Moderate obstructive airways disease with restriction of exhaled volume.  CBC Latest Ref Rng & Units 04/27/2018 03/15/2018 03/14/2018  WBC 4.0 - 10.5 K/uL 9.2 7.1 6.3  Hemoglobin 13.0 - 17.0 g/dL 12.0(L) 9.6(L) 9.4(L)  Hematocrit 39.0 - 52.0 % 36.9(L) 30.2(L) 31.2(L)  Platelets 150 - 400 K/uL 172 160 253    BMP Latest Ref Rng & Units 04/27/2018 03/15/2018 03/14/2018  Glucose 65 - 99 mg/dL 97 161(W) 960(A)  BUN 6 - 20 mg/dL 54(U) 14 15  Creatinine 0.61 - 1.24 mg/dL 9.81 1.91(Y) 7.82(N)  Sodium 135 - 145 mmol/L 142 138 137  Potassium 3.5 - 5.1 mmol/L 4.4 3.9 3.9  Chloride 101 - 111 mmol/L 110 103 104  CO2 22 - 32  mmol/L 19(L) 23 24  Calcium 8.9 - 10.3 mg/dL 8.1(L) 8.9 9.2    BNP No results found for: BNP  ProBNP    Component Value Date/Time   PROBNP 171.0 (H) 03/13/2018 1130    PFT No results found for: FEV1PRE, FEV1POST, FVCPRE, FVCPOST, TLC, DLCOUNC, PREFEV1FVCRT, PSTFEV1FVCRT  Dg Chest 2 View  Result Date: 04/27/2018 CLINICAL DATA:  Chest pain EXAM: CHEST - 2 VIEW COMPARISON:  03/13/2018 chest radiograph. FINDINGS: Stable cardiomediastinal silhouette with normal heart size. No pneumothorax. No pleural effusion. Stable mild right basilar scarring. No pulmonary edema. No acute consolidative airspace disease. IMPRESSION: No active cardiopulmonary disease. Electronically Signed   By: Delbert Phenix M.D.   On: 04/27/2018 07:49   Ct Angio Chest Pe W/cm &/or Wo Cm  Result Date: 04/27/2018 CLINICAL DATA:  Acute shortness of breath with chest pressure and history prior pulmonary embolism in March. EXAM: CT ANGIOGRAPHY CHEST WITH CONTRAST TECHNIQUE: Multidetector CT imaging of the chest was performed using the standard protocol during bolus administration of intravenous contrast. Multiplanar CT image reconstructions and MIPs were obtained to evaluate the vascular anatomy. CONTRAST:  ISOVUE-370 IOPAMIDOL (ISOVUE-370) INJECTION 76% COMPARISON:  03/14/2018 FINDINGS: Cardiovascular: No evidence of pulmonary embolism. Previously noted nonocclusive thrombus in right lower lobe pulmonary artery branches has completely resolved. Central pulmonary arteries are normal in caliber. The heart size is normal. There is evidence of probable left ventricular hypertrophy. No right-sided chamber dilatation. No pericardial fluid. The thoracic aorta is normal in caliber. Mediastinum/Nodes: No enlarged mediastinal, hilar, or axillary lymph nodes. Thyroid gland, trachea, and esophagus demonstrate no significant findings. Lungs/Pleura: There is no evidence of pulmonary edema, consolidation, pneumothorax, nodule or pleural fluid.  Upper Abdomen: No acute abnormality. Musculoskeletal: Stable degenerative disease of the thoracic spine. Review of the MIP images confirms the above findings. IMPRESSION: No acute findings in the chest. Complete resolution right lower lobe pulmonary emboli since the prior CTA. Probable underlying left ventricular hypertrophy. Electronically Signed   By: Irish Lack M.D.   On: 04/27/2018 09:56     Past medical hx Past Medical History:  Diagnosis Date  . Alcohol dependence (HCC) 09/07/2012  . Anemia   . Esophagitis 09/07/2012   Per EGD 04/2011  . Gastritis 09/07/2012   Per EGD 04/2011  . Headache    4 - 5 a yr (no migraines)  . HTN (hypertension) 09/07/2012  . Hypertension    takes Amlodipine,Labetalol,and Clonidine daily  . Ischemic chest pain   . Obesity   . Pulmonary emboli (HCC)   . Sleep apnea    pt states he is on bipap at night for sleep apnea  . Thrombocytopenia (HCC) 09/07/2012   Hx of in past.  . Tobacco abuse      Social History   Tobacco Use  . Smoking status: Current Some Day Smoker    Packs/day: 0.80    Years: 31.00    Pack years: 24.80  Types: Cigarettes    Last attempt to quit: 03/14/2013    Years since quitting: 5.1  . Smokeless tobacco: Never Used  Substance Use Topics  . Alcohol use: Yes    Alcohol/week: 0.6 oz    Types: 1 Shots of liquor per week    Comment: PT drinks 1 Pint of vodka  2x a week ,04-26-18 last time  PT drank   . Drug use: No    Mr.Weller reports that he has been smoking cigarettes.  He has a 24.80 pack-year smoking history. He has never used smokeless tobacco. He reports that he drinks about 0.6 oz of alcohol per week. He reports that he does not use drugs.  Tobacco Cessation: Current every day smoker   Past surgical hx, Family hx, Social hx all reviewed.  Current Outpatient Medications on File Prior to Visit  Medication Sig  . apixaban (ELIQUIS) 5 MG TABS tablet Take 1 tablet (5 mg total) by mouth 2 (two) times daily.  .  cloNIDine (CATAPRES) 0.1 MG tablet Take 0.2 mg by mouth 2 (two) times daily.   . furosemide (LASIX) 40 MG tablet Take 40 mg by mouth daily.  Marland Kitchen labetalol (NORMODYNE) 200 MG tablet Take 400 mg by mouth 2 (two) times daily.   . Multiple Vitamin (MULITIVITAMIN WITH MINERALS) TABS Take 1 tablet by mouth daily.   . naloxone (NARCAN) nasal spray 4 mg/0.1 mL Place 4 mg into the nose once as needed. overdose  . nicotine (NICODERM CQ - DOSED IN MG/24 HR) 7 mg/24hr patch Place 1 patch (7 mg total) onto the skin daily.  Marland Kitchen oxyCODONE-acetaminophen (PERCOCET) 10-325 MG tablet Take 1 tablet by mouth 2 (two) times daily as needed for pain (knees).   Marland Kitchen telmisartan (MICARDIS) 40 MG tablet Take 40 mg by mouth daily.   No current facility-administered medications on file prior to visit.      Allergies  Allergen Reactions  . Amlodipine Swelling    Pt cannot tolerate      Review Of Systems:  Constitutional:   No  weight loss, night sweats,  Fevers, chills, fatigue, or  lassitude.  HEENT:   No headaches,  Difficulty swallowing,  Tooth/dental problems, or  Sore throat,                No sneezing, itching, ear ache, nasal congestion, post nasal drip,   CV:  No chest pain,  Orthopnea, PND, swelling in lower extremities, anasarca, dizziness, palpitations, syncope.   GI  No heartburn, indigestion, abdominal pain, nausea, vomiting, diarrhea, change in bowel habits, loss of appetite, bloody stools.   Resp: + shortness of breath with exertion less at rest.  No excess mucus, no productive cough,  No non-productive cough,  No coughing up of blood.  No change in color of mucus.  No wheezing.  No chest wall deformity  Skin: no rash or lesions.  GU: no dysuria, change in color of urine, no urgency or frequency.  No flank pain, no hematuria   MS:  No joint pain or swelling.  No decreased range of motion.  No back pain.  Psych:  No change in mood or affect. No depression or anxiety.  No memory loss.   Vital  Signs BP (!) 190/96 (BP Location: Right Arm, Cuff Size: Normal)   Pulse (!) 55   Ht  (1.803 m)   Wt 269 lb 3.2 oz (122.1 kg)   SpO2 97%   BMI 37.55 kg/m    Physical Exam:  General- No distress,  A&Ox3, pleasant ENT: No sinus tenderness, TM clear, pale nasal mucosa, no oral exudate,no post nasal drip, no LAN Cardiac: S1, S2, regular rate and rhythm, no murmur Chest: No wheeze/ rales/ dullness; no accessory muscle use, no nasal flaring, no sternal retractions, diminished per bases. Abd.: Soft Non-tender, ND, Obese Ext: No clubbing cyanosis, edema Neuro:  normal strength, MAE x 4, A&O x 3 Skin: No rashes, warm and dry Psych: normal mood and behavior   Assessment/Plan  Pulmonary embolism (HCC) Resolving per repeat CTA done 04/28/2018 ( ED) Compliant with Eliquis Plan: Please follow up with PCP about BP  Continue Eliquis 5 mg twice daily It is important not to miss any doses of medication. Make sure you order new medication when you get to a 7 tablets in your bottle, as it may take a few days to get the medication filled.  Follow up with Dr. Maple Hudson in 1 month to ensure you are doing well. Bleeding Precautions  1 Do not double up dose if you accidentally miss a dose of medication. 2 Practice care with exercise or activities.  Even a small cut will; bleed.  Hold pressure until bleeding stops, seek emergency care if bleeding does not stop. 3 Wear gloves while using sharp objects like scissors or knives. 4 Switch to an Neurosurgeon . 5 Wear shoes at all times when mowing lawns or walking in the garden. 6 Use a soft toothbrush and waxed dental floss. 7 Wear a helmet when biking or skiing, or anytime a fall could cause head trauma. 8 No high impact contact sports like football. 9 Wear a medic alert bracelet to identify herself as someone who is on blood thinners when you cannot speak for yourself. 10 If you fall, or get hit hard go to the doctor or hospital.  Even if there is no  blood a bruise could mean bleeding under the skin or anywhere in your body.  A head injury can cause bleeding under your skull. 11 No over-the-counter medications, vitamins, or supplements unless you check with your physician first for interactions.  Aspirin, ibuprofen, naproxen, and Pepto-Bismol all have interactions that can cause increased bleeding. 12 Ask your doctor if it is okay to have alcohol.  Alcohol is metabolized by your liver which produces clotting factors and can have a direct impact on how well your blood thinner works.   Follow up with Dr. Maple Hudson   In 1 month  or before as needed.  Length of treatment with Eliquis will be at least 6 months. You will need a repeat echo of your heart in Sept. 2019To ensure normal cardiac function  Obstructive sleep apnea Compliant per patient Sim card will not down load Plan: We will order a new Sim card for your machine   Continue on CPAP at bedtime. You appear to be benefiting from the treatment Goal is to wear for at least 6 hours each night for maximal clinical benefit. Continue to work on weight loss, as the link between excess weight  and sleep apnea is well established.  Do not drive if sleepy. Remember to clean mask, tubing, filter, and reservoir once weekly with soapy water.  Follow up with 1 month with Dr. Maple Hudson  or before as needed.   COPD (chronic obstructive pulmonary disease) (HCC) We will start Symbicort 2 puffs twice daily  Rinse mouth after use PFT's prior to next visit Follow up with Dr. Maple Hudson in 1 month    Bevelyn Ngo,  NP 04/30/2018  10:09 PM

## 2018-04-30 NOTE — Assessment & Plan Note (Signed)
We will start Symbicort 2 puffs twice daily  Rinse mouth after use PFT's prior to next visit Follow up with Dr. Maple Hudson in 1 month

## 2018-04-30 NOTE — Patient Instructions (Addendum)
Please follow up with PCP about BP  Continue Eliquis 5 mg twice daily It is important not to miss any doses of medication. Make sure you order new medication when you get to a 7 tablets in your bottle, as it may take a few days to get the medication filled.  We will add  Symbicort 2 puffs twice  daily ( Samples) Rinse mouth after use. Schedule PFT's before next appointment. Follow up with Dr. Maple Hudson in 1 month to ensure you are doing well. Bleeding Precautions  1 Do not double up dose if you accidentally miss a dose of medication. 2 Practice care with exercise or activities.  Even a small cut will; bleed.  Hold pressure until bleeding stops, seek emergency care if bleeding does not stop. 3 Wear gloves while using sharp objects like scissors or knives. 4 Switch to an Neurosurgeon . 5 Wear shoes at all times when mowing lawns or walking in the garden. 6 Use a soft toothbrush and waxed dental floss. 7 Wear a helmet when biking or skiing, or anytime a fall could cause head trauma. 8 No high impact contact sports like football. 9 Wear a medic alert bracelet to identify herself as someone who is on blood thinners when you cannot speak for yourself. 10 If you fall, or get hit hard go to the doctor or hospital.  Even if there is no blood a bruise could mean bleeding under the skin or anywhere in your body.  A head injury can cause bleeding under your skull. 11 No over-the-counter medications, vitamins, or supplements unless you check with your physician first for interactions.  Aspirin, ibuprofen, naproxen, and Pepto-Bismol all have interactions that can cause increased bleeding. 12 Ask your doctor if it is okay to have alcohol.  Alcohol is metabolized by your liver which produces clotting factors and can have a direct impact on how well your blood thinner works.   Continue on CPAP at bedtime. You appear to be benefiting from the treatment Goal is to wear for at least 6 hours each night for maximal  clinical benefit. Continue to work on weight loss, as the link between excess weight  and sleep apnea is well established.  Do not drive if sleepy. Remember to clean mask, tubing, filter, and reservoir once weekly with soapy water.  Follow up with Dr.Young  In 1 month  or before as needed.   Quit smoking. This is the single most powerful action you can take to decrease your risk of lung cancer and pulmonary disease. We will need to schedule a follow up echo in September 2019 , 6 months post PE to ensure normal cardiac function.

## 2018-04-30 NOTE — Assessment & Plan Note (Signed)
Compliant per patient Sim card will not down load Plan: We will order a new Sim card for your machine   Continue on CPAP at bedtime. You appear to be benefiting from the treatment Goal is to wear for at least 6 hours each night for maximal clinical benefit. Continue to work on weight loss, as the link between excess weight  and sleep apnea is well established.  Do not drive if sleepy. Remember to clean mask, tubing, filter, and reservoir once weekly with soapy water.  Follow up with 1 month with Dr. Maple Hudson  or before as needed.

## 2018-05-01 DIAGNOSIS — D649 Anemia, unspecified: Secondary | ICD-10-CM | POA: Diagnosis not present

## 2018-05-01 DIAGNOSIS — I2782 Chronic pulmonary embolism: Secondary | ICD-10-CM | POA: Diagnosis not present

## 2018-05-01 DIAGNOSIS — I10 Essential (primary) hypertension: Secondary | ICD-10-CM | POA: Diagnosis not present

## 2018-05-01 DIAGNOSIS — F1020 Alcohol dependence, uncomplicated: Secondary | ICD-10-CM | POA: Diagnosis not present

## 2018-05-06 DIAGNOSIS — M545 Low back pain: Secondary | ICD-10-CM | POA: Diagnosis not present

## 2018-05-06 DIAGNOSIS — M549 Dorsalgia, unspecified: Secondary | ICD-10-CM | POA: Diagnosis not present

## 2018-05-06 DIAGNOSIS — Z79891 Long term (current) use of opiate analgesic: Secondary | ICD-10-CM | POA: Diagnosis not present

## 2018-05-06 DIAGNOSIS — M25569 Pain in unspecified knee: Secondary | ICD-10-CM | POA: Diagnosis not present

## 2018-05-06 DIAGNOSIS — G894 Chronic pain syndrome: Secondary | ICD-10-CM | POA: Diagnosis not present

## 2018-05-06 DIAGNOSIS — Z79899 Other long term (current) drug therapy: Secondary | ICD-10-CM | POA: Diagnosis not present

## 2018-05-06 NOTE — Telephone Encounter (Signed)
Will sned order once it's signed

## 2018-05-06 NOTE — Telephone Encounter (Signed)
I called [facility] and they stated pt does not have an account with them. I called [facility] and they states they don't have an account with them either. I placed order to [facility]. At the time of visit he stated he had a CPAP machine but there was no SD card from his machine. He is enrolled in [employer] there is no data and it will not create a report. FYI PCC's.   Patient Instructions by Waymon Budge, MD at 02/16/2016 4:23 PM  Author: Waymon Budge, MD Author Type: Physician Filed: 02/16/2016 4:26 PM  Note Status: Signed Cosign: Cosign Not Required Encounter Date: 02/16/2016  Editor: Waymon Budge, MD (Physician)    We can continue CPAP auto 16-20 You can erase your SD card so we have current documentation next year  It will help you to fight your weight back down.  Hopefully you will get a good report from your cardiology test  Please call if we can help

## 2018-06-02 ENCOUNTER — Ambulatory Visit (INDEPENDENT_AMBULATORY_CARE_PROVIDER_SITE_OTHER): Payer: BLUE CROSS/BLUE SHIELD | Admitting: Internal Medicine

## 2018-06-02 DIAGNOSIS — R0602 Shortness of breath: Secondary | ICD-10-CM | POA: Diagnosis not present

## 2018-06-02 LAB — PULMONARY FUNCTION TEST
DL/VA % pred: 92 %
DL/VA: 4.28 ml/min/mmHg/L
DLCO COR % PRED: 61 %
DLCO COR: 20 ml/min/mmHg
DLCO UNC % PRED: 56 %
DLCO UNC: 18.36 ml/min/mmHg
FEF 25-75 POST: 2.09 L/s
FEF 25-75 PRE: 1.65 L/s
FEF2575-%Change-Post: 26 %
FEF2575-%PRED-PRE: 56 %
FEF2575-%Pred-Post: 70 %
FEV1-%Change-Post: 5 %
FEV1-%PRED-POST: 73 %
FEV1-%Pred-Pre: 69 %
FEV1-POST: 2.63 L
FEV1-Pre: 2.5 L
FEV1FVC-%Change-Post: 6 %
FEV1FVC-%PRED-PRE: 95 %
FEV6-%CHANGE-POST: 0 %
FEV6-%Pred-Post: 74 %
FEV6-%Pred-Pre: 75 %
FEV6-Post: 3.39 L
FEV6-Pre: 3.42 L
FEV6FVC-%CHANGE-POST: 0 %
FEV6FVC-%PRED-POST: 105 %
FEV6FVC-%Pred-Pre: 104 %
FVC-%Change-Post: -1 %
FVC-%PRED-PRE: 72 %
FVC-%Pred-Post: 71 %
FVC-POST: 3.39 L
FVC-PRE: 3.44 L
POST FEV6/FVC RATIO: 100 %
PRE FEV1/FVC RATIO: 73 %
Post FEV1/FVC ratio: 77 %
Pre FEV6/FVC Ratio: 99 %
RV % pred: 94 %
RV: 2.13 L
TLC % PRED: 78 %
TLC: 5.49 L

## 2018-06-02 NOTE — Progress Notes (Signed)
PFT done today. 

## 2018-06-06 ENCOUNTER — Other Ambulatory Visit: Payer: Self-pay | Admitting: Internal Medicine

## 2018-06-06 DIAGNOSIS — G894 Chronic pain syndrome: Secondary | ICD-10-CM | POA: Diagnosis not present

## 2018-06-06 DIAGNOSIS — Z79899 Other long term (current) drug therapy: Secondary | ICD-10-CM | POA: Diagnosis not present

## 2018-06-06 DIAGNOSIS — Z79891 Long term (current) use of opiate analgesic: Secondary | ICD-10-CM | POA: Diagnosis not present

## 2018-06-06 DIAGNOSIS — M549 Dorsalgia, unspecified: Secondary | ICD-10-CM | POA: Diagnosis not present

## 2018-06-06 DIAGNOSIS — M25569 Pain in unspecified knee: Secondary | ICD-10-CM | POA: Diagnosis not present

## 2018-06-06 DIAGNOSIS — M545 Low back pain: Secondary | ICD-10-CM | POA: Diagnosis not present

## 2018-06-06 DIAGNOSIS — M1710 Unilateral primary osteoarthritis, unspecified knee: Secondary | ICD-10-CM | POA: Diagnosis not present

## 2018-06-15 NOTE — Progress Notes (Signed)
@Patient  ID: Jeremy Douglas, male    DOB: 1956/03/14, 62 y.o.   MRN: [id number]  Chief Complaint  Patient presents with  . Follow-up    States he is compliant with CPAP. Reports his breathing is better, needs PFT results. Needs symbicort samples. States the symbicort is effective.     Referring provider: Darrow Bussing, MD  HPI: Jeremy Douglas is a 62 y.o. male with morbid obesity, smoking, osa on cpap, resistant hypertension, major depression, probable COPD, and anemia. He is followed by Dr. Maple Hudson for sleep apnea.    Current smoker working to stop.  24.8 pack years.  Recent [facility] Encounters:   04/30/2018  6 week follow up per Dr. Maple Hudson. Pt. Was seen by Dr. Maple Hudson 03/13/2018 for worsening back pain and dyspnea x 3 months.. D-dimer was +, and Pt was diagnosed with an unprovoked  PE  Per CTA on 03/14/18  . He was treated at [facility] 3/22-3/23. He was treated with heparin and transitioned to and discharged on Eliquis. He was seen again in the ED on 04/28/2018 for worsening shortness of breath and associated chest pressure. CTA was repeated and showed resolution of original PE. Troponins were negative and CXR was clear.  He admitted to alcohol withdrawal and asked for additional ativan from the ED staff. He was started on Lithium and encouraged to seek  detox as an outpatient. He was told to quit smoking. He is seen at the pain clinic , and called the office to request additional percocet for his chronic back pain from Dr. Maple Hudson on 03/17/2018. Dr. Maple Hudson encouraged the patient to go through the pain clinic for all pain medications.  Pt. Returns today for follow up. He Is taking Eliquis 5 mg twice daily.Creatinine 04/27/2017 was 1.23, HGB 12, Platelets 172,000. He states he has been compliant with his eliquis. No s/s of bleeding. His breathing is better. He continues to smoke. His BP is very high despite triple therapy which he states he is compliant with and  using.all his meds He has an  appointment with his PCP tomorrow morning at 10:15 to address his BP. Pt. Still has some shortness of breath,  sats are  97% today in the office.Marland Kitchen He is compliant with his CPAP. He states he even wears it with his naps.  He has an alarm on his phone to remind him to take his medication. He states he has adequate supply of Eliquis. We discussed the importance of not missing any dosages. He verbalized understanding. He has had no issues with bleeding or bruising.   Plan:  Pulmonary embolism (HCC) Resolving per repeat CTA done 04/28/2018 ( ED) Compliant with Eliquis Plan: Please follow up with PCP about BP  Continue Eliquis 5 mg twice daily It is important not to miss any doses of medication. Make sure you order new medication when you get to a 7 tablets in your bottle, as it may take a few days to get the medication filled.  Follow up with Dr. Maple Hudson in 1 month to ensure you are doing well. Bleeding Precautions  1 Do not double up dose if you accidentally miss a dose of medication. 2 Practice care with exercise or activities.  Even a small cut will; bleed.  Hold pressure until bleeding stops, seek emergency care if bleeding does not stop. 3 Wear gloves while using sharp objects like scissors or knives. 4 Switch to an Neurosurgeon . 5 Wear shoes at all times when mowing lawns or  walking in the garden. 6 Use a soft toothbrush and waxed dental floss. 7 Wear a helmet when biking or skiing, or anytime a fall could cause head trauma. 8 No high impact contact sports like football. 9 Wear a medic alert bracelet to identify herself as someone who is on blood thinners when you cannot speak for yourself. 10 If you fall, or get hit hard go to the doctor or hospital.  Even if there is no blood a bruise could mean bleeding under the skin or anywhere in your body.  A head injury can cause bleeding under your skull. 11 No over-the-counter medications, vitamins, or supplements unless you check with your physician  first for interactions.  Aspirin, ibuprofen, naproxen, and Pepto-Bismol all have interactions that can cause increased bleeding. 12 Ask your doctor if it is okay to have alcohol.  Alcohol is metabolized by your liver which produces clotting factors and can have a direct impact on how well your blood thinner works.   Follow up with Dr. Maple HudsonYoung   In 1 month  or before as needed.  Length of treatment with Eliquis will be at least 6 months. You will need a repeat echo of your heart in Sept. 2019 To ensure normal cardiac function  Obstructive sleep apnea Compliant per patient Sim card will not down load Plan: We will order a new Sim card for your machine   Continue on CPAP at bedtime. You appear to be benefiting from the treatment Goal is to wear for at least 6 hours each night for maximal clinical benefit. Continue to work on weight loss, as the link between excess weight  and sleep apnea is well established.  Do not drive if sleepy. Remember to clean mask, tubing, filter, and reservoir once weekly with soapy water.  Follow up with 1 month with Dr. Maple HudsonYoung  or before as needed.   COPD (chronic obstructive pulmonary disease) (HCC) We will start Symbicort 2 puffs twice daily  Rinse mouth after use PFT's prior to next visit Follow up with Dr. Maple HudsonYoung in 1 month     Tests:  04/27/2018-CT Angio-no acute findings in chest, complete resolution right lower lobe pulmonary emboli since the prior CTA 04/27/2018-chest x-ray-no active cardiopulmonary disease 03/14/2018-CT angios-positive for acute PE to the right lower lobe with CT evidence of right heart strain consistent with at least submassive PE 06/02/2018-pulmonary function test- mild airway obstruction is present, the diffusion defect at reduced lung volumes suggest early parenchymal process, mild restriction, moderate diffusion defect 03/15/2018-echocardiogram-LV ejection fraction 60 to 65%, mild LVH     06/15/18 OV  Patient presents office  visit today.  Patient reporting feeling better with using Symbicort.  Patient admits he was skeptical of Symbicort use and did not think that it was working.  But then patient ran out of Symbicort and he felt that his breathing got worse.  Patient has Symbicort now.  Reports no issues with compliance, or affording this medication.  Patient using $10 co-pay card currently.  Patient completed pulmonary function test on 06/02/2018.  Patient is still a current smoker averaging 4 to 5 cigarettes a day.  Patient knows he should stop smoking.  Patient aware that this makes it difficult to manage his COPD and respiratory status if he continues to smoke.  Patient is adherent to using his CPAP per patient report.  Unable to get compliance report as patient has machine with full ST card and he has yet to follow-up with his DME supply company to  get this cleared.  We set him up with a supply company at last appointment in order to make sure that we can get compliance reports.  Patient reports he just "has not gotten around to doing that yet".  Patient reports he has no issues using CPAP, and uses it every day as well as with naps.  Allergies  Allergen Reactions  . Amlodipine Swelling    Pt cannot tolerate 10mg      Immunization History  Administered Date(s) Administered  . Influenza Split 09/08/2012, 10/24/2013, 09/23/2014, 10/05/2015, 09/23/2017  . Influenza Whole 09/10/2008  . Pneumococcal Polysaccharide-23 09/08/2012    Past Medical History:  Diagnosis Date  . Alcohol dependence (HCC) 09/07/2012  . Anemia   . Esophagitis 09/07/2012   Per EGD 04/2011  . Gastritis 09/07/2012   Per EGD 04/2011  . Headache    4 - 5 a yr (no migraines)  . HTN (hypertension) 09/07/2012  . Hypertension    takes Amlodipine,Labetalol,and Clonidine daily  . Ischemic chest pain   . Obesity   . Pulmonary emboli (HCC)   . Sleep apnea    pt states he is on bipap at night for sleep apnea  . Thrombocytopenia (HCC) 09/07/2012     Hx of in past.  . Tobacco abuse     Tobacco History: Social History   Tobacco Use  Smoking Status Current Some Day Smoker  . Packs/day: 0.80  . Years: 31.00  . Pack years: 24.80  . Types: Cigarettes  . Last attempt to quit: 03/14/2013  . Years since quitting: 5.2  Smokeless Tobacco Never Used  Tobacco Comment   4-5 cigarettes/day 06/16/18   Ready to quit: Yes Counseling given: Yes Comment: 4-5 cigarettes/day 06/16/18 Patient knows that he needs to stop smoking.  Encourage patient to continue to log reduced to quit method.  Patient working to stop smoking.  Smoking cessation resources provided on discharge paperwork.  Outpatient Encounter Medications as of 06/16/2018  Medication Sig  . budesonide-formoterol (SYMBICORT) 160-4.5 MCG/ACT inhaler Inhale 2 puffs into the lungs 2 (two) times daily.  . cloNIDine (CATAPRES) 0.1 MG tablet Take 0.2 mg by mouth 2 (two) times daily.   Marland Kitchen ELIQUIS 5 MG TABS tablet TAKE 1 TABLET(5 MG) BY MOUTH TWICE DAILY  . furosemide (LASIX) 40 MG tablet Take 40 mg by mouth daily.  Marland Kitchen labetalol (NORMODYNE) 200 MG tablet Take 400 mg by mouth 2 (two) times daily.   . Multiple Vitamin (MULITIVITAMIN WITH MINERALS) TABS Take 1 tablet by mouth daily.   . naloxone (NARCAN) nasal spray 4 mg/0.1 mL Place 4 mg into the nose once as needed. overdose  . nicotine (NICODERM CQ - DOSED IN MG/24 HR) 7 mg/24hr patch Place 1 patch (7 mg total) onto the skin daily.  Marland Kitchen oxyCODONE-acetaminophen (PERCOCET) 10-325 MG tablet Take 1 tablet by mouth 2 (two) times daily as needed for pain (knees).   Marland Kitchen telmisartan (MICARDIS) 40 MG tablet Take 40 mg by mouth daily.   No facility-administered encounter medications on file as of 06/16/2018.     Review of Systems  Constitutional:   No  weight loss, night sweats,  fevers, chills, fatigue, or  lassitude HEENT:   No headaches,  Difficulty swallowing,  Tooth/dental problems, or  Sore throat, No sneezing, itching, ear ache, nasal congestion,  post nasal drip  CV: No chest pain,  orthopnea, PND, swelling in lower extremities, anasarca, dizziness, palpitations, syncope  GI: No heartburn, indigestion, abdominal pain, nausea, vomiting, diarrhea, change in bowel habits, loss  of appetite, bloody stools Resp: +improved sob with symbicort use No shortness of breath with exertion or at rest.  No excess mucus, no productive cough,  No non-productive cough,  No coughing up of blood.  No change in color of mucus.  No wheezing.  No chest wall deformity Skin: no rash, lesions, no skin changes. GU: no dysuria, change in color of urine, no urgency or frequency.  No flank pain, no hematuria  MS:  No joint pain or swelling.  No decreased range of motion.  No back pain. Psych:  No change in mood or affect. No depression or anxiety.  No memory loss.   Physical Exam  BP (!) 158/80   Pulse (!) 56   Ht 5\' 10"  (1.778 m)   Wt 279 lb 6.4 oz (126.7 kg)   SpO2 95%   BMI 40.09 kg/m   Wt Readings from Last 3 Encounters:  06/16/18 279 lb 6.4 oz (126.7 kg)  04/30/18 269 lb 3.2 oz (122.1 kg)  03/14/18 275 lb (124.7 kg)     GEN: A/Ox3; pleasant , NAD, well nourished    HEENT:  [place]/AT,  EACs-clear, TMs-wnl, NOSE-clear, THROAT-+post nasal drip, mallampati III,   NECK:  Supple w/ fair ROM; no JVD;  no lymphadenopathy.    RESP:  +dimished breath sounds, no wheeze heard on exam  no accessory muscle use, no dullness to percussion  CARD:  RRR, no m/r/g, no peripheral edema, pulses intact, no cyanosis or clubbing.  GI:   Soft & nt; nml bowel sounds; no organomegaly or masses detected.   Musco: Warm bil, no deformities or joint swelling noted.   Neuro: alert, no focal deficits noted.    Skin: Warm, no lesions or rashes    Lab Results:  CBC    Component Value Date/Time   WBC 9.2 04/27/2018 0640   RBC 5.86 (H) 04/27/2018 0640   HGB 12.0 (L) 04/27/2018 0640   HGB 11.5 (L) 09/13/2010 1141   HCT 36.9 (L) 04/27/2018 0640   HCT 35.6 (L) 09/13/2010  1141   PLT 172 04/27/2018 0640   PLT 229 09/13/2010 1141   MCV 63.0 (L) 04/27/2018 0640   MCV 63.6 (L) 09/13/2010 1141   MCH 20.5 (L) 04/27/2018 0640   MCHC 32.5 04/27/2018 0640   RDW 16.2 (H) 04/27/2018 0640   RDW 15.1 (H) 09/13/2010 1141   LYMPHSABS 2.6 03/14/2018 1932   LYMPHSABS 2.0 09/13/2010 1141   MONOABS 0.8 03/14/2018 1932   MONOABS 0.8 09/13/2010 1141   EOSABS 0.2 03/14/2018 1932   EOSABS 0.1 09/13/2010 1141   BASOSABS 0.0 03/14/2018 1932   BASOSABS 0.0 09/13/2010 1141    BMET    Component Value Date/Time   NA 142 04/27/2018 0640   K 4.4 04/27/2018 0640   CL 110 04/27/2018 0640   CO2 19 (L) 04/27/2018 0640   GLUCOSE 97 04/27/2018 0640   BUN 23 (H) 04/27/2018 0640   CREATININE 1.23 04/27/2018 0640   CALCIUM 8.1 (L) 04/27/2018 0640   GFRNONAA >60 04/27/2018 0640   GFRAA >60 04/27/2018 0640    BNP No results found for: BNP  ProBNP    Component Value Date/Time   PROBNP 171.0 (H) 03/13/2018 1130    Imaging: No results found.   Assessment & Plan:   Pleasant 62 year old patient seen in office today.  Patient doing well.  Patient adherent to Symbicort reports no issues using it.  Reviewed pulmonary function test from 06/02/2018 appointment.  Emphasized the importance of patient stopping  smoking, patient agrees.  Also emphasized the importance of patient exercising regularly to improve with dyspnea on exertion and so patient does not get deconditioned.  Working towards a healthy weight can also help improve obstructive sleep apnea.  Patient to continue using CPAP nightly, to follow-up with DME company to clear ST card so we can start getting compliance reports.  Patient to contact our office if he has any issues following this process so that we can help him.  COPD (chronic obstructive pulmonary disease) (HCC) Reviewed PFT with patient today Continue Symbicort Emphasized the importance of stopping smoking Improve your daily exercise Follow-up with Dr. Maple Hudson  in 3 months   Essential hypertension Continue follow-up with primary care regarding your hypertension Continue your blood pressure medications as prescribed   Obstructive sleep apnea Contact your DME company that we set you up with after last appointment Follow-up with them so that you can clear your SD card so we can start getting compliance downloads. Wear your CPAP nightly Follow-up with [place] if you have any issues with your respiratory status, or using your CPAP.  Tobacco use disorder, moderate, in sustained remission It is our recommendation that you stop smoking Continue to work towards stopping smoking If you have any issues stopping smoking please follow-up with our office and we can work to support you on this Discharge paperwork with smoking cessation resources provided     Coral Ceo, NP 06/16/2018

## 2018-06-16 ENCOUNTER — Ambulatory Visit: Payer: BLUE CROSS/BLUE SHIELD | Admitting: Pulmonary Disease

## 2018-06-16 ENCOUNTER — Encounter: Payer: Self-pay | Admitting: Pulmonary Disease

## 2018-06-16 ENCOUNTER — Ambulatory Visit: Payer: BLUE CROSS/BLUE SHIELD | Admitting: Emergency Medicine

## 2018-06-16 DIAGNOSIS — G4733 Obstructive sleep apnea (adult) (pediatric): Secondary | ICD-10-CM

## 2018-06-16 DIAGNOSIS — I10 Essential (primary) hypertension: Secondary | ICD-10-CM

## 2018-06-16 DIAGNOSIS — J439 Emphysema, unspecified: Secondary | ICD-10-CM | POA: Diagnosis not present

## 2018-06-16 DIAGNOSIS — F17201 Nicotine dependence, unspecified, in remission: Secondary | ICD-10-CM | POA: Diagnosis not present

## 2018-06-16 NOTE — Assessment & Plan Note (Signed)
It is our recommendation that you stop smoking Continue to work towards stopping smoking If you have any issues stopping smoking please follow-up with our office and we can work to support you on this Discharge paperwork with smoking cessation resources provided

## 2018-06-16 NOTE — Assessment & Plan Note (Signed)
Contact your DME company that we set you up with after last appointment Follow-up with them so that you can clear your SD card so we can start getting compliance downloads. Wear your CPAP nightly Follow-up with us if you have any issues with your respiratory status, or using your CPAP.

## 2018-06-16 NOTE — Assessment & Plan Note (Signed)
Continue follow-up with primary care regarding your hypertension Continue your blood pressure medications as prescribed

## 2018-06-16 NOTE — Assessment & Plan Note (Signed)
Reviewed PFT with patient today Continue Symbicort Emphasized the importance of stopping smoking Improve your daily exercise Follow-up with Dr. Maple HudsonYoung in 3 months

## 2018-06-16 NOTE — Patient Instructions (Addendum)
Continue CPAP use  Continue Symbicort  >>>2 puffs right when you wake up, rinse mouth out, 12 hours later 2 puffs, rinse out your mouth   1 800 QUIT NOW  >>> Patient to call this resource and utilize it to help support her quit smoking >>> Keep up your hard work with stopping smoking  You can also contact the [facility] >>>For smoking cessation classes call [phone]   Follow-up with the DME company that we will ask that you contact.  So that we can get your SD card cleared so we can start getting compliance reports for you.  Marland Kitchen. Keep up the hard work using your device.  . Do not drive or operate heavy machinery if tired or drowsy.  . Please notify the supply company and office if you are unable to use your device regularly due to missing supplies or machine being broken.  . Work on maintaining a healthy weight and following your recommended nutrition plan  . Maintain proper daily exercise and movement  . Maintaining proper use of your device can also help improve management of other chronic illnesses such as: Blood pressure, blood sugars, and weight management.   CPAP Cleaning:  Clean weekly, with Dawn soap, and bottle brush.  Set up to air dry.  Follow up in 3 months to see Dr. Maple HudsonYoung    Please contact the office if your symptoms worsen or you have concerns that you are not improving.   Thank you for choosing [facility] for your healthcare, and for allowing us to partner with you on your healthcare journey. I am thankful to be able to provide care to you today.   Elisha HeadlandBrian Mack FNP-C

## 2018-06-19 ENCOUNTER — Telehealth: Payer: Self-pay | Admitting: Pulmonary Disease

## 2018-06-19 MED ORDER — BUDESONIDE-FORMOTEROL FUMARATE 160-4.5 MCG/ACT IN AERO: 2.0000 | INHALATION_SPRAY | Freq: Two times a day (BID) | RESPIRATORY_TRACT | 2 refills | Status: DC

## 2018-06-19 NOTE — Telephone Encounter (Signed)
Called and spoke with pt and he is aware of medication has been sent to the pharmacy.  Nothing further is needed.

## 2018-07-08 DIAGNOSIS — M25569 Pain in unspecified knee: Secondary | ICD-10-CM | POA: Diagnosis not present

## 2018-07-08 DIAGNOSIS — Z79891 Long term (current) use of opiate analgesic: Secondary | ICD-10-CM | POA: Diagnosis not present

## 2018-07-08 DIAGNOSIS — Z79899 Other long term (current) drug therapy: Secondary | ICD-10-CM | POA: Diagnosis not present

## 2018-07-08 DIAGNOSIS — M545 Low back pain: Secondary | ICD-10-CM | POA: Diagnosis not present

## 2018-07-08 DIAGNOSIS — M549 Dorsalgia, unspecified: Secondary | ICD-10-CM | POA: Diagnosis not present

## 2018-07-08 DIAGNOSIS — G894 Chronic pain syndrome: Secondary | ICD-10-CM | POA: Diagnosis not present

## 2018-07-31 DIAGNOSIS — I10 Essential (primary) hypertension: Secondary | ICD-10-CM | POA: Diagnosis not present

## 2018-08-05 DIAGNOSIS — M549 Dorsalgia, unspecified: Secondary | ICD-10-CM | POA: Diagnosis not present

## 2018-08-05 DIAGNOSIS — G894 Chronic pain syndrome: Secondary | ICD-10-CM | POA: Diagnosis not present

## 2018-08-05 DIAGNOSIS — M545 Low back pain: Secondary | ICD-10-CM | POA: Diagnosis not present

## 2018-08-05 DIAGNOSIS — Z79899 Other long term (current) drug therapy: Secondary | ICD-10-CM | POA: Diagnosis not present

## 2018-08-05 DIAGNOSIS — M1710 Unilateral primary osteoarthritis, unspecified knee: Secondary | ICD-10-CM | POA: Diagnosis not present

## 2018-08-05 DIAGNOSIS — Z79891 Long term (current) use of opiate analgesic: Secondary | ICD-10-CM | POA: Diagnosis not present

## 2018-09-02 DIAGNOSIS — M1710 Unilateral primary osteoarthritis, unspecified knee: Secondary | ICD-10-CM | POA: Diagnosis not present

## 2018-09-02 DIAGNOSIS — G894 Chronic pain syndrome: Secondary | ICD-10-CM | POA: Diagnosis not present

## 2018-09-02 DIAGNOSIS — Z79891 Long term (current) use of opiate analgesic: Secondary | ICD-10-CM | POA: Diagnosis not present

## 2018-09-02 DIAGNOSIS — Z79899 Other long term (current) drug therapy: Secondary | ICD-10-CM | POA: Diagnosis not present

## 2018-09-02 DIAGNOSIS — M549 Dorsalgia, unspecified: Secondary | ICD-10-CM | POA: Diagnosis not present

## 2018-09-02 DIAGNOSIS — M545 Low back pain: Secondary | ICD-10-CM | POA: Diagnosis not present

## 2018-09-13 ENCOUNTER — Other Ambulatory Visit: Payer: Self-pay | Admitting: Internal Medicine

## 2018-09-25 ENCOUNTER — Encounter: Payer: Self-pay | Admitting: Internal Medicine

## 2018-09-25 ENCOUNTER — Ambulatory Visit: Payer: BLUE CROSS/BLUE SHIELD | Admitting: Internal Medicine

## 2018-09-25 VITALS — BP 156/88 | HR 56 | Ht 70.0 in | Wt 293.4 lb

## 2018-09-25 DIAGNOSIS — I2692 Saddle embolus of pulmonary artery without acute cor pulmonale: Secondary | ICD-10-CM

## 2018-09-25 DIAGNOSIS — R7989 Other specified abnormal findings of blood chemistry: Secondary | ICD-10-CM | POA: Diagnosis not present

## 2018-09-25 DIAGNOSIS — Z23 Encounter for immunization: Secondary | ICD-10-CM | POA: Diagnosis not present

## 2018-09-25 DIAGNOSIS — G4733 Obstructive sleep apnea (adult) (pediatric): Secondary | ICD-10-CM | POA: Diagnosis not present

## 2018-09-25 NOTE — Assessment & Plan Note (Signed)
Just got a new download card and needs to install it so that we can assess machine function.  He will bring it over in a month for [place] to review.  He insists he benefits from CPAP with better sleep and cannot sleep without it, using it anytime he sleeps including naps. Plan-continue CPAP auto 16-20

## 2018-09-25 NOTE — Progress Notes (Signed)
Subjective:    Patient ID: Jeremy Douglas, male    DOB: [birth year], 62 y.o.   MRN: [id number]  HPI male former smoker followed for OSA, complicated by HBP, obesity, depression, anemia,  NPSG 10/06/08--  AHI 109.3/ hr, desat to 60%, body weight 311 lbs Office Spirometry 03/13/18-moderate obstructive airways disease with reduced exhaled volume.  FVC 3.38/68%, FEV1 2.40/64%, ratio 0.79, FEF 25-75% 1.57/51% ---------------------------------------------------------------------------  03/13/18- 62 year old male former smoker followed for OSA, DVT/PE, COPD complicated by HBP, obesity CPAP auto  16-20, was [employer], but gets supplies on line ----SOB comes and goes with activity  EF 55-60% on echocardiogram 02/21/16, LAE He relates shortness of breath since he fell on stairs at Christmas time hurting his back.  Receiving physical therapy twice weekly for back spasm.  Percocet for knee pains after bilateral knee replacements.  Percocet blunts his shortness of breath.  Wheezes a little.  Denies cough or chest pain.  Legs have been swelling. Wears CPAP 16-20 anytime he lies down.  Nocturia x2 or 3. Office Spirometry 03/13/18-moderate obstructive airways disease with reduced exhaled volume.  FVC 3.38/68%, FEV1 2.40/64%, ratio 0.79, FEF 25-75% 1.57/51%  09/25/2018- 62 year old male  smoker followed for OSA, DVT/PE(Eliquis 6 month end 09/25/18), COPD complicated by HBP, obesity,  depression, anemia,  CPAP auto  16-20, was [employer], but gets supplies on line -----OSA: DME: Pt gets all supplies and changes to machine online. CPAP came from [place]. Will need Rx for supplies to take with him.    He continues to smoke against advice. Body weight today 293 pounds Eliquis, Symbicort 160 PE in March and has just finished 6 months of Eliquis with no significant bleeding problems.  He has had bilateral knee replacements but no family history of clotting and no obvious acute provoking incident. No download available but he  reports using CPAP anytime he sleeps and "depending on it".  Has 2 machines. Little cough, stable exercise tolerance, no chest pain or palpitation. CT chest-04/27/2018 IMPRESSION: No acute findings in the chest. Complete resolution right lower lobe pulmonary emboli since the prior CTA. Probable underlying left ventricular hypertrophy.  ROS-see HPI + = positive Constitutional:   No-   weight loss, night sweats, fevers, chills, fatigue, lassitude. HEENT:   No-  headaches, difficulty swallowing, tooth/dental problems, sore throat,       No-  sneezing, itching, ear ache, nasal congestion, post nasal drip,  CV:  No-   chest pain, orthopnea, PND, swelling in lower extremities, anasarca,  dizziness, palpitations Resp: +  shortness of breath with exertion or at rest.              No-   productive cough,  No non-productive cough,  No- coughing up of blood.              No-   change in color of mucus.  No- wheezing.   Skin: No-   rash or lesions. GI:  No-   heartburn, indigestion, abdominal pain, nausea, vomiting,  GU: . MS:  + joint pain or swelling.   Neuro-     nothing unusual Psych:  No- change in mood or affect. No depression or anxiety.  No memory loss.    Objective:  OBJ- Physical Exam General- Alert, Oriented, Affect-appropriate, Distress- none acute. + Overweight Skin- rash-none, lesions- none, excoriation- none Lymphadenopathy- none Head- atraumatic            Eyes- Gross vision intact, PERRLA, conjunctivae and secretions clear  Ears- Hearing, canals-normal            Nose- Clear, no-Septal dev, mucus, polyps, erosion, perforation             Throat- Mallampati III , mucosa clear , drainage- none, tonsils- atrophic Neck- flexible , trachea midline, no stridor , thyroid nl, carotid no bruit Chest - symmetrical excursion , unlabored           Heart/CV- RRR , no murmur , no gallop  , no rub, nl s1 s2                           - JVD+ 1-2 , edema +1, stasis changes- none,  varices- none           Lung- clear to P&A, wheeze- none, cough- none , dullness-none, rub- none           Chest wall-  Abd-  Br/ Gen/ Rectal- Not done, not indicated Extrem- cyanosis- none, clubbing, none, atrophy- none, strength- nl, neg  Homan's Neuro- grossly intact to observation  Assessment & Plan:

## 2018-09-25 NOTE — Assessment & Plan Note (Signed)
He denies exacerbations, productive cough or wheeze and feels Symbicort is adequate.

## 2018-09-25 NOTE — Patient Instructions (Signed)
Install your new SD card so we can see how the machine is doing. You can bring it to [place] for a download in a month.  We can continue CPAP auto 16-20, mask of choice, humidifier supplies,   Order- flu vax- standard  Ok to stay off Eliquis for now. Avoid "stasis" in your leg veins, by moving your legs, elevating them if sitting, and walk around a lot.  Order- lab-  future- after 1 week off Eliquis,  Hypercoagulable panel

## 2018-09-25 NOTE — Assessment & Plan Note (Signed)
We think this was an unprovoked PE with negative family history, although he has had bilateral knee replacements in the past.  He has finished 6 months of Eliquis, noting easy bruising but no significant bleed. Plan-stay off Eliquis.  Lab to be drawn in 1 week for hypercoagulable panel.  Avoid venous stasis as discussed.

## 2018-09-29 ENCOUNTER — Telehealth: Payer: Self-pay | Admitting: Internal Medicine

## 2018-09-29 MED ORDER — BUDESONIDE-FORMOTEROL FUMARATE 160-4.5 MCG/ACT IN AERO: 2.0000 | INHALATION_SPRAY | Freq: Two times a day (BID) | RESPIRATORY_TRACT | 11 refills | Status: DC

## 2018-09-29 NOTE — Telephone Encounter (Signed)
Spoke with pt. He is needing a refill on Symbicort. Rx has been sent in. Nothing further was needed at this time.

## 2018-09-30 DIAGNOSIS — Z79891 Long term (current) use of opiate analgesic: Secondary | ICD-10-CM | POA: Diagnosis not present

## 2018-09-30 DIAGNOSIS — Z79899 Other long term (current) drug therapy: Secondary | ICD-10-CM | POA: Diagnosis not present

## 2018-09-30 DIAGNOSIS — M1710 Unilateral primary osteoarthritis, unspecified knee: Secondary | ICD-10-CM | POA: Diagnosis not present

## 2018-09-30 DIAGNOSIS — G894 Chronic pain syndrome: Secondary | ICD-10-CM | POA: Diagnosis not present

## 2018-09-30 DIAGNOSIS — M549 Dorsalgia, unspecified: Secondary | ICD-10-CM | POA: Diagnosis not present

## 2018-09-30 DIAGNOSIS — M545 Low back pain: Secondary | ICD-10-CM | POA: Diagnosis not present

## 2018-10-03 ENCOUNTER — Other Ambulatory Visit: Payer: BLUE CROSS/BLUE SHIELD

## 2018-10-03 DIAGNOSIS — R7989 Other specified abnormal findings of blood chemistry: Secondary | ICD-10-CM | POA: Diagnosis not present

## 2018-10-03 NOTE — Addendum Note (Signed)
Addended by: Trellis Paganini D on: 10/03/2018 01:04 PM   Modules accepted: Orders

## 2018-10-10 ENCOUNTER — Other Ambulatory Visit: Payer: Self-pay

## 2018-10-21 LAB — HYPERCOAGULABLE PANEL, COMPREHENSIVE
APTT: 27 s
AT III ACT/NOR PPP CHRO: 107 %
Act. Prt C Resist w/FV Defic.: 2.8 ratio
Anticardiolipin Ab, IgG: <10 [GPL'U]
Anticardiolipin Ab, IgM: 34 [MPL'U] — ABNORMAL HIGH
Beta-2 Glycoprotein I, IgA: <10 SAU
Beta-2 Glycoprotein I, IgG: <10 SGU
Beta-2 Glycoprotein I, IgM: 33 SMU — ABNORMAL HIGH
DRVVT Screen Seconds: 47 s
FACTOR VIII ACTIVITY: 200 % — AB
Factor VII Antigen**: 192 % — ABNORMAL HIGH
HOMOCYSTEINE: 13.5 umol/L
Hexagonal Phospholipid Neutral: 3 s
PROT S AG ACT/NOR PPP IMM: 135 %
Prot C Ag Act/Nor PPP Imm: 121 %
Protein C Ag/FVII Ag Ratio**: 0.6 ratio
Protein S Ag/FVII Ag Ratio**: 0.7 ratio

## 2018-10-23 ENCOUNTER — Telehealth: Payer: Self-pay | Admitting: Internal Medicine

## 2018-10-23 NOTE — Telephone Encounter (Signed)
Attempted to contact pt. I did not receive an answer. There was no option for me to leave a message. Will try back.  

## 2018-10-24 NOTE — Telephone Encounter (Signed)
Per TP: this is a clinical decision that should be made by Dr. Maple Hudson.  Thank you.

## 2018-10-24 NOTE — Telephone Encounter (Signed)
Called and spoke with Patient.  Patient is requesting recent labs and if he should be on Eliquis. Explained that Dr. Maple Hudson was out of the office this afternoon, but we would send him a message.  Patient stated understanding.    Will route message to Dr. Maple Hudson  Allergies  Allergen Reactions  . Amlodipine Swelling    Pt cannot tolerate 10mg     Current Outpatient Medications on File Prior to Visit  Medication Sig Dispense Refill  . budesonide-formoterol (SYMBICORT) 160-4.5 MCG/ACT inhaler Inhale 2 puffs into the lungs 2 (two) times daily. 1 Inhaler 11  . cloNIDine (CATAPRES) 0.1 MG tablet Take 0.2 mg by mouth 2 (two) times daily.     . furosemide (LASIX) 40 MG tablet Take 40 mg by mouth daily.    Marland Kitchen labetalol (NORMODYNE) 200 MG tablet Take 400 mg by mouth 2 (two) times daily.     . Multiple Vitamin (MULITIVITAMIN WITH MINERALS) TABS Take 1 tablet by mouth daily.     Marland Kitchen oxyCODONE-acetaminophen (PERCOCET) 10-325 MG tablet Take 1 tablet by mouth 2 (two) times daily as needed for pain (knees).     Marland Kitchen telmisartan (MICARDIS) 40 MG tablet Take 40 mg by mouth daily.  1   No current facility-administered medications on file prior to visit.

## 2018-10-24 NOTE — Telephone Encounter (Signed)
Attempted to call pt but no answer. Left message for pt to return call. 

## 2018-10-24 NOTE — Telephone Encounter (Signed)
Called and spoke with pt who was requesting lab results from 10/11. Pt stated he had been taken off of Eliquis and was to have labwork performed to see if labs would be okay with him staying off of Eliquis or to see if he needed to be put back on it.  Due to Dr. Maple Hudson being out of the office, sending to APP of the day.  Tammy Parrett, please advise on results of pt's labwork. Thanks!

## 2018-10-24 NOTE — Telephone Encounter (Signed)
Pt is calling back [phone] 

## 2018-10-27 MED ORDER — APIXABAN 5 MG PO TABS: 5.0000 mg | ORAL_TABLET | Freq: Two times a day (BID) | ORAL | 3 refills | Status: DC

## 2018-10-27 NOTE — Telephone Encounter (Signed)
There are some abnormal results on the blood clotting assessment lab that can indicate increased risk of clotting. Suggest he stay on Eliquis until his next appointment here in February. We will discuss more then.

## 2018-10-27 NOTE — Telephone Encounter (Signed)
Called and spoke with patient he is aware of CY response and verbalized understanding. Patient stated he would need another refill. Refill sent. Nothing further needed.

## 2018-10-28 DIAGNOSIS — G894 Chronic pain syndrome: Secondary | ICD-10-CM | POA: Diagnosis not present

## 2018-10-28 DIAGNOSIS — M1710 Unilateral primary osteoarthritis, unspecified knee: Secondary | ICD-10-CM | POA: Diagnosis not present

## 2018-10-28 DIAGNOSIS — E669 Obesity, unspecified: Secondary | ICD-10-CM | POA: Diagnosis not present

## 2018-10-28 DIAGNOSIS — Z79899 Other long term (current) drug therapy: Secondary | ICD-10-CM | POA: Diagnosis not present

## 2018-10-28 DIAGNOSIS — Z79891 Long term (current) use of opiate analgesic: Secondary | ICD-10-CM | POA: Diagnosis not present

## 2018-10-28 DIAGNOSIS — M545 Low back pain: Secondary | ICD-10-CM | POA: Diagnosis not present

## 2018-10-30 ENCOUNTER — Other Ambulatory Visit: Payer: Self-pay | Admitting: Internal Medicine

## 2018-10-31 DIAGNOSIS — D649 Anemia, unspecified: Secondary | ICD-10-CM | POA: Diagnosis not present

## 2018-10-31 DIAGNOSIS — I10 Essential (primary) hypertension: Secondary | ICD-10-CM | POA: Diagnosis not present

## 2018-11-25 DIAGNOSIS — M545 Low back pain: Secondary | ICD-10-CM | POA: Diagnosis not present

## 2018-11-25 DIAGNOSIS — M1710 Unilateral primary osteoarthritis, unspecified knee: Secondary | ICD-10-CM | POA: Diagnosis not present

## 2018-11-25 DIAGNOSIS — M549 Dorsalgia, unspecified: Secondary | ICD-10-CM | POA: Diagnosis not present

## 2018-11-25 DIAGNOSIS — G894 Chronic pain syndrome: Secondary | ICD-10-CM | POA: Diagnosis not present

## 2018-12-02 ENCOUNTER — Encounter: Payer: Self-pay | Admitting: Adult Health

## 2018-12-02 ENCOUNTER — Ambulatory Visit (INDEPENDENT_AMBULATORY_CARE_PROVIDER_SITE_OTHER): Payer: BLUE CROSS/BLUE SHIELD | Admitting: Adult Health

## 2018-12-02 DIAGNOSIS — J449 Chronic obstructive pulmonary disease, unspecified: Secondary | ICD-10-CM | POA: Diagnosis not present

## 2018-12-02 NOTE — Assessment & Plan Note (Addendum)
Suspected COPD exacerbation with new onset hypoxemia stable etiology.  Patient is vaping.  Will need further evaluation to make sure that he does not have acute lung injury.  The Xopenex nebulizer treatment given the office without significant improvement.  Patient also had a PE earlier this year .  Was off of Eliquis for 1 month.  Is now back on Eliquis and says he has been compliant.  Low suspicion for PE however may need a CTA to rule out possible lung injury and or PE. Will require hospitalization for further evaluation and treatment options. Both local hospitals were called and are full with no direct admit bed availability.  Patient will be referred to the emergency room for further evaluation.  ER triage was called with report. Declined EMS transport.  Vital signs are stable.  O2 saturations at rest are 93%. Case discussed with Dr. Maple HudsonYoung    Plan  Patient Instructions  Go to ER for further evaluation .

## 2018-12-02 NOTE — Progress Notes (Signed)
 @Patient  ID: Rennie Plowmanoger C Odowd, male    DOB: January 19, 1956, 62 y.o.   MRN: [id number]  Chief Complaint  Patient presents with  . Acute Visit    COPD     Referring provider: Darrow BussingKoirala, [place], MD  HPI: 62 year old male smoker (cigs/vaping)  followed for COPD, obstructive sleep apnea on CPAP  History significant for previous PE (02/2018-09/25/2018 rx Eliquis , Hypercoaguable panel positive , started back on Eliquis 10/23/18    TEST/EVENTS :  NPSG 10/06/08--  AHI 109.3/ hr, desat to 60%, body weight 311 lbs  Office Spirometry 03/13/18-moderate obstructive airways disease with reduced exhaled volume.  FVC 3.38/68%, FEV1 2.40/64%, ratio 0.79, FEF 25-75% 1.57/51%  CT chest May 2019 showed resolution of PE   12/02/2018 Acute OV : COPD  Patient presents for an acute office visit. Complains of severe shortness of breath . Says he has had progressive dyspnea for last 4-days .  Occasionally has some intermittent wheezing.  On arrival to the office today O2 saturations were 83% on room air.  Patient was severely dyspneic.  Required several minutes to rest.  O2 saturations rebounded to 93%.  No cough , discolored mucus , fever, body aches, or increased swelling .  He denies any chest pain, calf pain, hemoptysis, orthopnea.  He says he is having a hard time walking around due to his shortness of breath that is progressively getting worse. Pt does smoke and vape.   Had DVT and PE March 2019 . Tx w/ Eliquis for 6 months . Stopped in early October and did hypercoagulable panel that came positive.  Patient was restarted on Eliquis on October 23, 2018.  He says he is not missed any doses.  Denies any known bleeding.  Allergies  Allergen Reactions  . Amlodipine Swelling    Pt cannot tolerate 10mg      Immunization History  Administered Date(s) Administered  . Influenza Split 09/08/2012, 10/24/2013, 09/23/2014, 10/05/2015, 09/23/2017, 09/23/2018  . Influenza Whole 09/10/2008  . Influenza,inj,Quad PF,6+  Mos 09/25/2018  . Pneumococcal Polysaccharide-23 09/08/2012    Past Medical History:  Diagnosis Date  . Alcohol dependence (HCC) 09/07/2012  . Anemia   . Esophagitis 09/07/2012   Per EGD 04/2011  . Gastritis 09/07/2012   Per EGD 04/2011  . Headache    4 - 5 a yr (no migraines)  . HTN (hypertension) 09/07/2012  . Hypertension    takes Amlodipine,Labetalol,and Clonidine daily  . Ischemic chest pain (HCC)   . Obesity   . Pulmonary emboli (HCC)   . Sleep apnea    pt states he is on bipap at night for sleep apnea  . Thrombocytopenia (HCC) 09/07/2012   Hx of in past.  . Tobacco abuse     Tobacco History: Social History   Tobacco Use  Smoking Status Current Some Day Smoker  . Packs/day: 0.80  . Years: 31.00  . Pack years: 24.80  . Types: Cigarettes  . Last attempt to quit: 03/14/2013  . Years since quitting: 5.7  Smokeless Tobacco Never Used  Tobacco Comment   4-5 cigarettes/day 06/16/18   Ready to quit: Not Answered Counseling given: Not Answered Comment: 4-5 cigarettes/day 06/16/18   Outpatient Medications Prior to Visit  Medication Sig Dispense Refill  . apixaban (ELIQUIS) 5 MG TABS tablet Take 1 tablet (5 mg total) by mouth 2 (two) times daily. 60 tablet 3  . budesonide-formoterol (SYMBICORT) 160-4.5 MCG/ACT inhaler Inhale 2 puffs into the lungs 2 (two) times daily. 1 Inhaler 11  . cloNIDine (CATAPRES)  0.2 MG tablet Take 0.2 mg by mouth 2 (two) times daily.     . furosemide (LASIX) 40 MG tablet Take 40 mg by mouth daily.    Marland Kitchen labetalol (NORMODYNE) 200 MG tablet Take 400 mg by mouth 2 (two) times daily.     . Multiple Vitamin (MULITIVITAMIN WITH MINERALS) TABS Take 1 tablet by mouth daily.     Marland Kitchen oxyCODONE-acetaminophen (PERCOCET) 10-325 MG tablet Take 1 tablet by mouth every 8 (eight) hours as needed for pain (knees).     Marland Kitchen telmisartan (MICARDIS) 40 MG tablet Take 40 mg by mouth daily.  1   No facility-administered medications prior to visit.      Review of  Systems  Constitutional:   No  weight loss, night sweats,  Fevers, chills,  +fatigue, or  lassitude.  HEENT:   No headaches,  Difficulty swallowing,  Tooth/dental problems, or  Sore throat,                No sneezing, itching, ear ache, nasal congestion, post nasal drip,   CV:  No chest pain,  Orthopnea, PND, swelling in lower extremities, anasarca, dizziness, palpitations, syncope.   GI  No heartburn, indigestion, abdominal pain, nausea, vomiting, diarrhea, change in bowel habits, loss of appetite, bloody stools.   Resp:  No wheezing.  No chest wall deformity  Skin: no rash or lesions.  GU: no dysuria, change in color of urine, no urgency or frequency.  No flank pain, no hematuria   MS:  No joint pain or swelling.  No decreased range of motion.  No back pain.    Physical Exam  BP 138/72 (BP Location: Left Arm, Cuff Size: Large)   Pulse 61   Temp 98.2 F (36.8 C) (Oral)   Ht 5' 10.5" (1.791 m)   Wt (!) 301 lb 3.2 oz (136.6 kg)   SpO2 93%   BMI 42.61 kg/m   GEN: A/Ox3; pleasant , NAD, obese    HEENT:  [place]/AT,  EACs-clear, TMs-wnl, NOSE-clear, THROAT-clear, no lesions, no postnasal drip or exudate noted.   NECK:  Supple w/ fair ROM; no JVD; normal carotid impulses w/o bruits; no thyromegaly or nodules palpated; no lymphadenopathy.    RESP  Clear  P & A; w/o, wheezes/ rales/ or rhonchi. no accessory muscle use, no dullness to percussion  CARD:  RRR, no m/r/g, 1+ peripheral edema, pulses intact, no cyanosis or clubbing.  GI:   Soft & nt; nml bowel sounds; no organomegaly or masses detected.   Musco: Warm bil, no deformities or joint swelling noted.   Neuro: alert, no focal deficits noted.    Skin: Warm, no lesions or rashes    Lab Results:  CBC  BMET Imaging: No results found.    PFT Results Latest Ref Rng & Units 06/02/2018  FVC-Pre L 3.44  FVC-Predicted Pre % 72  FVC-Post L 3.39  FVC-Predicted Post % 71  Pre FEV1/FVC % % 73  Post FEV1/FCV % % 77   FEV1-Pre L 2.50  FEV1-Predicted Pre % 69  FEV1-Post L 2.63  DLCO UNC% % 56  DLCO COR %Predicted % 92  TLC L 5.49  TLC % Predicted % 78  RV % Predicted % 94    No results found for: NITRICOXIDE      Assessment & Plan:   COPD mixed type (HCC) Suspected COPD exacerbation with new onset hypoxemia stable etiology.  Patient is vaping.  Will need further evaluation to make sure that he does not  have acute lung injury.  The Xopenex nebulizer treatment given the office without significant improvement.  Patient also had a PE earlier this year .  Was off of Eliquis for 1 month.  Is now back on Eliquis and says he has been compliant.  Low suspicion for PE however may need a CTA to rule out possible lung injury and or PE. Will require hospitalization for further evaluation and treatment options. Both local hospitals were called and are full with no direct admit bed availability.  Patient will be referred to the emergency room for further evaluation.  ER triage was called with report. Declined EMS transport.  Vital signs are stable.  O2 saturations at rest are 93%. Case discussed with Dr. Maple Hudson    Plan  Patient Instructions  Go to ER for further evaluation .           Rubye Oaks, NP 12/02/2018

## 2018-12-02 NOTE — Patient Instructions (Signed)
Go to ER for further evaluation .  

## 2018-12-03 ENCOUNTER — Other Ambulatory Visit: Payer: Self-pay

## 2018-12-03 ENCOUNTER — Emergency Department (HOSPITAL_COMMUNITY): Payer: BLUE CROSS/BLUE SHIELD

## 2018-12-03 ENCOUNTER — Inpatient Hospital Stay (HOSPITAL_COMMUNITY)
Admission: EM | Admit: 2018-12-03 | Discharge: 2018-12-06 | DRG: 291 | Disposition: A | Discharge status: Home / Self Care | Payer: BLUE CROSS/BLUE SHIELD | Attending: Internal Medicine | Admitting: Internal Medicine

## 2018-12-03 ENCOUNTER — Observation Stay (HOSPITAL_BASED_OUTPATIENT_CLINIC_OR_DEPARTMENT_OTHER): Payer: BLUE CROSS/BLUE SHIELD

## 2018-12-03 ENCOUNTER — Encounter (HOSPITAL_COMMUNITY): Payer: Self-pay | Admitting: Internal Medicine

## 2018-12-03 DIAGNOSIS — R079 Chest pain, unspecified: Secondary | ICD-10-CM

## 2018-12-03 DIAGNOSIS — J441 Chronic obstructive pulmonary disease with (acute) exacerbation: Secondary | ICD-10-CM | POA: Diagnosis not present

## 2018-12-03 DIAGNOSIS — I2699 Other pulmonary embolism without acute cor pulmonale: Secondary | ICD-10-CM | POA: Diagnosis present

## 2018-12-03 DIAGNOSIS — Z7951 Long term (current) use of inhaled steroids: Secondary | ICD-10-CM | POA: Diagnosis not present

## 2018-12-03 DIAGNOSIS — R0682 Tachypnea, not elsewhere classified: Secondary | ICD-10-CM | POA: Diagnosis not present

## 2018-12-03 DIAGNOSIS — Z6841 Body Mass Index (BMI) 40.0 and over, adult: Secondary | ICD-10-CM | POA: Diagnosis not present

## 2018-12-03 DIAGNOSIS — Z888 Allergy status to other drugs, medicaments and biological substances status: Secondary | ICD-10-CM

## 2018-12-03 DIAGNOSIS — I2510 Atherosclerotic heart disease of native coronary artery without angina pectoris: Secondary | ICD-10-CM | POA: Diagnosis present

## 2018-12-03 DIAGNOSIS — Z86711 Personal history of pulmonary embolism: Secondary | ICD-10-CM

## 2018-12-03 DIAGNOSIS — G8929 Other chronic pain: Secondary | ICD-10-CM | POA: Diagnosis not present

## 2018-12-03 DIAGNOSIS — Z79899 Other long term (current) drug therapy: Secondary | ICD-10-CM | POA: Diagnosis not present

## 2018-12-03 DIAGNOSIS — Z96653 Presence of artificial knee joint, bilateral: Secondary | ICD-10-CM | POA: Diagnosis present

## 2018-12-03 DIAGNOSIS — Z7901 Long term (current) use of anticoagulants: Secondary | ICD-10-CM

## 2018-12-03 DIAGNOSIS — E1122 Type 2 diabetes mellitus with diabetic chronic kidney disease: Secondary | ICD-10-CM | POA: Diagnosis not present

## 2018-12-03 DIAGNOSIS — D509 Iron deficiency anemia, unspecified: Secondary | ICD-10-CM | POA: Diagnosis present

## 2018-12-03 DIAGNOSIS — J9601 Acute respiratory failure with hypoxia: Secondary | ICD-10-CM | POA: Diagnosis present

## 2018-12-03 DIAGNOSIS — E1165 Type 2 diabetes mellitus with hyperglycemia: Secondary | ICD-10-CM | POA: Diagnosis present

## 2018-12-03 DIAGNOSIS — G4730 Sleep apnea, unspecified: Secondary | ICD-10-CM | POA: Insufficient documentation

## 2018-12-03 DIAGNOSIS — R7989 Other specified abnormal findings of blood chemistry: Secondary | ICD-10-CM | POA: Diagnosis not present

## 2018-12-03 DIAGNOSIS — I340 Nonrheumatic mitral (valve) insufficiency: Secondary | ICD-10-CM

## 2018-12-03 DIAGNOSIS — N183 Chronic kidney disease, stage 3 unspecified: Secondary | ICD-10-CM | POA: Diagnosis present

## 2018-12-03 DIAGNOSIS — J810 Acute pulmonary edema: Secondary | ICD-10-CM

## 2018-12-03 DIAGNOSIS — I130 Hypertensive heart and chronic kidney disease with heart failure and stage 1 through stage 4 chronic kidney disease, or unspecified chronic kidney disease: Principal | ICD-10-CM | POA: Diagnosis present

## 2018-12-03 DIAGNOSIS — J9621 Acute and chronic respiratory failure with hypoxia: Secondary | ICD-10-CM

## 2018-12-03 DIAGNOSIS — D649 Anemia, unspecified: Secondary | ICD-10-CM | POA: Diagnosis present

## 2018-12-03 DIAGNOSIS — I5033 Acute on chronic diastolic (congestive) heart failure: Secondary | ICD-10-CM | POA: Diagnosis not present

## 2018-12-03 DIAGNOSIS — R001 Bradycardia, unspecified: Secondary | ICD-10-CM | POA: Diagnosis present

## 2018-12-03 DIAGNOSIS — G4733 Obstructive sleep apnea (adult) (pediatric): Secondary | ICD-10-CM | POA: Diagnosis not present

## 2018-12-03 DIAGNOSIS — R0602 Shortness of breath: Secondary | ICD-10-CM | POA: Diagnosis not present

## 2018-12-03 DIAGNOSIS — J449 Chronic obstructive pulmonary disease, unspecified: Secondary | ICD-10-CM | POA: Diagnosis not present

## 2018-12-03 DIAGNOSIS — E785 Hyperlipidemia, unspecified: Secondary | ICD-10-CM | POA: Diagnosis present

## 2018-12-03 DIAGNOSIS — Z9989 Dependence on other enabling machines and devices: Secondary | ICD-10-CM

## 2018-12-03 DIAGNOSIS — F17201 Nicotine dependence, unspecified, in remission: Secondary | ICD-10-CM | POA: Diagnosis present

## 2018-12-03 DIAGNOSIS — Z720 Tobacco use: Secondary | ICD-10-CM | POA: Diagnosis present

## 2018-12-03 DIAGNOSIS — Z09 Encounter for follow-up examination after completed treatment for conditions other than malignant neoplasm: Secondary | ICD-10-CM

## 2018-12-03 DIAGNOSIS — Z66 Do not resuscitate: Secondary | ICD-10-CM | POA: Diagnosis present

## 2018-12-03 DIAGNOSIS — R591 Generalized enlarged lymph nodes: Secondary | ICD-10-CM | POA: Diagnosis not present

## 2018-12-03 DIAGNOSIS — I10 Essential (primary) hypertension: Secondary | ICD-10-CM | POA: Diagnosis present

## 2018-12-03 DIAGNOSIS — M7989 Other specified soft tissue disorders: Secondary | ICD-10-CM | POA: Diagnosis not present

## 2018-12-03 DIAGNOSIS — F17210 Nicotine dependence, cigarettes, uncomplicated: Secondary | ICD-10-CM | POA: Diagnosis not present

## 2018-12-03 DIAGNOSIS — R0902 Hypoxemia: Secondary | ICD-10-CM | POA: Diagnosis not present

## 2018-12-03 DIAGNOSIS — F1020 Alcohol dependence, uncomplicated: Secondary | ICD-10-CM | POA: Diagnosis present

## 2018-12-03 HISTORY — DX: Unspecified osteoarthritis, unspecified site: M19.90

## 2018-12-03 HISTORY — DX: Obstructive sleep apnea (adult) (pediatric): G47.33

## 2018-12-03 HISTORY — DX: Chronic obstructive pulmonary disease, unspecified: J44.9

## 2018-12-03 HISTORY — DX: Cardiac murmur, unspecified: R01.1

## 2018-12-03 LAB — CBC WITH DIFFERENTIAL/PLATELET
Abs Immature Granulocytes: 0 10*3/uL (ref 0.00–0.07)
Basophils Absolute: 0 10*3/uL (ref 0.0–0.1)
Basophils Relative: 0 %
EOS ABS: 0.2 10*3/uL (ref 0.0–0.5)
Eosinophils Relative: 2 %
HCT: 34.9 % — ABNORMAL LOW (ref 39.0–52.0)
Hemoglobin: 10.3 g/dL — ABNORMAL LOW (ref 13.0–17.0)
Lymphocytes Relative: 17 %
Lymphs Abs: 1.6 10*3/uL (ref 0.7–4.0)
MCH: 18.5 pg — ABNORMAL LOW (ref 26.0–34.0)
MCHC: 29.5 g/dL — ABNORMAL LOW (ref 30.0–36.0)
MCV: 62.7 fL — ABNORMAL LOW (ref 80.0–100.0)
MONOS PCT: 4 %
Monocytes Absolute: 0.4 10*3/uL (ref 0.1–1.0)
Neutro Abs: 7.4 10*3/uL (ref 1.7–7.7)
Neutrophils Relative %: 77 %
Platelets: 240 10*3/uL (ref 150–400)
RBC: 5.57 MIL/uL (ref 4.22–5.81)
RDW: 15.9 % — AB (ref 11.5–15.5)
WBC: 9.6 10*3/uL (ref 4.0–10.5)
nRBC: 0 % (ref 0.0–0.2)
nRBC: 0 /100 WBC

## 2018-12-03 LAB — BASIC METABOLIC PANEL
Anion gap: 14 (ref 5–15)
BUN: 22 mg/dL (ref 8–23)
CO2: 21 mmol/L — ABNORMAL LOW (ref 22–32)
Calcium: 9.6 mg/dL (ref 8.9–10.3)
Chloride: 103 mmol/L (ref 98–111)
Creatinine, Ser: 1.47 mg/dL — ABNORMAL HIGH (ref 0.61–1.24)
GFR calc Af Amer: 59 mL/min — ABNORMAL LOW (ref >60)
GFR calc non Af Amer: 51 mL/min — ABNORMAL LOW (ref >60)
Glucose, Bld: 153 mg/dL — ABNORMAL HIGH (ref 70–99)
POTASSIUM: 3.8 mmol/L (ref 3.5–5.1)
Sodium: 138 mmol/L (ref 135–145)

## 2018-12-03 LAB — ECHOCARDIOGRAM COMPLETE
Height: 70.5 in
Weight: 4819.19 oz

## 2018-12-03 LAB — TROPONIN I: Troponin I: 0.03 ng/mL (ref <0.03)

## 2018-12-03 LAB — BRAIN NATRIURETIC PEPTIDE: B Natriuretic Peptide: 831.1 pg/mL — ABNORMAL HIGH (ref 0.0–100.0)

## 2018-12-03 MED ORDER — CLONIDINE HCL 0.1 MG PO TABS
0.1000 mg | ORAL_TABLET | Freq: Once | ORAL | Status: AC
  Administered 2018-12-03: 0.1 mg via ORAL
  Filled 2018-12-03: qty 1

## 2018-12-03 MED ORDER — APIXABAN 5 MG PO TABS
5.0000 mg | ORAL_TABLET | Freq: Two times a day (BID) | ORAL | Status: DC
  Administered 2018-12-03 – 2018-12-06 (×6): 5 mg via ORAL
  Filled 2018-12-03 (×7): qty 1

## 2018-12-03 MED ORDER — IPRATROPIUM-ALBUTEROL 0.5-2.5 (3) MG/3ML IN SOLN
3.0000 mL | Freq: Three times a day (TID) | RESPIRATORY_TRACT | Status: DC
  Administered 2018-12-04: 3 mL via RESPIRATORY_TRACT
  Filled 2018-12-03: qty 3

## 2018-12-03 MED ORDER — SODIUM CHLORIDE 0.9% FLUSH
3.0000 mL | Freq: Two times a day (BID) | INTRAVENOUS | Status: DC
  Administered 2018-12-03 – 2018-12-06 (×7): 3 mL via INTRAVENOUS

## 2018-12-03 MED ORDER — ALBUTEROL SULFATE (2.5 MG/3ML) 0.083% IN NEBU
5.0000 mg | INHALATION_SOLUTION | Freq: Once | RESPIRATORY_TRACT | Status: AC
  Administered 2018-12-03: 5 mg via RESPIRATORY_TRACT
  Filled 2018-12-03: qty 6

## 2018-12-03 MED ORDER — OXYCODONE-ACETAMINOPHEN 5-325 MG PO TABS
1.0000 | ORAL_TABLET | Freq: Three times a day (TID) | ORAL | Status: DC | PRN
  Administered 2018-12-03 – 2018-12-06 (×9): 1 via ORAL
  Filled 2018-12-03 (×9): qty 1

## 2018-12-03 MED ORDER — HYDRALAZINE HCL 20 MG/ML IJ SOLN
5.0000 mg | INTRAMUSCULAR | Status: DC | PRN
  Administered 2018-12-03 – 2018-12-05 (×3): 5 mg via INTRAVENOUS
  Filled 2018-12-03 (×3): qty 1

## 2018-12-03 MED ORDER — MOMETASONE FURO-FORMOTEROL FUM 200-5 MCG/ACT IN AERO
2.0000 | INHALATION_SPRAY | Freq: Two times a day (BID) | RESPIRATORY_TRACT | Status: DC
  Administered 2018-12-04 – 2018-12-06 (×3): 2 via RESPIRATORY_TRACT
  Filled 2018-12-03: qty 8.8

## 2018-12-03 MED ORDER — IRBESARTAN 300 MG PO TABS
150.0000 mg | ORAL_TABLET | Freq: Every day | ORAL | Status: DC
  Administered 2018-12-04 – 2018-12-06 (×3): 150 mg via ORAL
  Filled 2018-12-03 (×4): qty 1

## 2018-12-03 MED ORDER — PREDNISONE 20 MG PO TABS: 40.0000 mg | ORAL_TABLET | Freq: Every day | ORAL | Status: DC

## 2018-12-03 MED ORDER — IOPAMIDOL (ISOVUE-370) INJECTION 76%
INTRAVENOUS | Status: AC
  Filled 2018-12-03: qty 100

## 2018-12-03 MED ORDER — IPRATROPIUM-ALBUTEROL 0.5-2.5 (3) MG/3ML IN SOLN
3.0000 mL | Freq: Four times a day (QID) | RESPIRATORY_TRACT | Status: DC
  Administered 2018-12-03 (×2): 3 mL via RESPIRATORY_TRACT
  Filled 2018-12-03 (×2): qty 3

## 2018-12-03 MED ORDER — IOPAMIDOL (ISOVUE-370) INJECTION 76%
100.0000 mL | Freq: Once | INTRAVENOUS | Status: AC | PRN
  Administered 2018-12-03: 100 mL via INTRAVENOUS

## 2018-12-03 MED ORDER — FUROSEMIDE 10 MG/ML IJ SOLN
40.0000 mg | Freq: Two times a day (BID) | INTRAMUSCULAR | Status: DC
  Administered 2018-12-03 – 2018-12-05 (×4): 40 mg via INTRAVENOUS
  Filled 2018-12-03 (×4): qty 4

## 2018-12-03 MED ORDER — CLONIDINE HCL 0.2 MG PO TABS
0.2000 mg | ORAL_TABLET | Freq: Two times a day (BID) | ORAL | Status: DC
  Administered 2018-12-03 – 2018-12-06 (×6): 0.2 mg via ORAL
  Filled 2018-12-03 (×6): qty 1

## 2018-12-03 MED ORDER — OXYCODONE HCL 5 MG PO TABS
5.0000 mg | ORAL_TABLET | Freq: Three times a day (TID) | ORAL | Status: DC | PRN
  Administered 2018-12-04 – 2018-12-06 (×7): 5 mg via ORAL
  Filled 2018-12-03 (×8): qty 1

## 2018-12-03 MED ORDER — TIZANIDINE HCL 4 MG PO TABS
4.0000 mg | ORAL_TABLET | ORAL | Status: DC | PRN
  Filled 2018-12-03: qty 1

## 2018-12-03 MED ORDER — METHYLPREDNISOLONE SODIUM SUCC 125 MG IJ SOLR: 60.0000 mg | Freq: Two times a day (BID) | INTRAMUSCULAR | Status: DC

## 2018-12-03 MED ORDER — LABETALOL HCL 200 MG PO TABS
400.0000 mg | ORAL_TABLET | Freq: Two times a day (BID) | ORAL | Status: DC
  Administered 2018-12-03 – 2018-12-04 (×3): 400 mg via ORAL
  Filled 2018-12-03 (×3): qty 2

## 2018-12-03 MED ORDER — NICOTINE 14 MG/24HR TD PT24
14.0000 mg | MEDICATED_PATCH | Freq: Every day | TRANSDERMAL | Status: DC
  Administered 2018-12-03 – 2018-12-06 (×4): 14 mg via TRANSDERMAL
  Filled 2018-12-03 (×4): qty 1

## 2018-12-03 MED ORDER — ALBUTEROL SULFATE (2.5 MG/3ML) 0.083% IN NEBU: 2.5000 mg | INHALATION_SOLUTION | RESPIRATORY_TRACT | Status: DC | PRN

## 2018-12-03 MED ORDER — OXYCODONE-ACETAMINOPHEN 10-325 MG PO TABS: 1.0000 | ORAL_TABLET | Freq: Three times a day (TID) | ORAL | Status: DC | PRN

## 2018-12-03 NOTE — ED Notes (Addendum)
Pt 02 sat @ bedside on 2L is 94%. During ambulation pt 02 sat decreased to 88% on room air. Pt returned safely to bedside 02 sat @ 90% on room air. Pt feel winded and SOB after short distance walk down the hallway. 2L 02 applied to pt.

## 2018-12-03 NOTE — H&P (Signed)
History and Physical    Jeremy Douglas RUE:[id number] DOB: November 22, 1956 DOA: 12/03/2018  PCP: Jeremy Bussing, MD Consultants:  Jeremy Douglas - pulmonology; Jeremy Douglas - cardiology; Jeremy Douglas - vascularLuiz Douglas - orthopedics; Jeremy Litter, PA - pain management Patient coming from:  Home - lives with daughter and son-in-law; NOK: Daughter, [phone]  Chief Complaint: SOB  HPI: Jeremy Douglas is a 62 y.o. male with medical history significant of PE on Eliquis; OSA on CPAP; CAD; HTN; COPD not on home O2; and ETOH dependence presenting with SOB.  Patient went to Dr. Maple Douglas yesterday because of SOB.  He had a couple of episodes Monday AM and Friday evening where he just couldn't catch his breath for 20-30 minutes.  He had not taken his Symbicort dose before each of the episodes and he had not taken his pain medication, which usually helps him breathe.  He used his Symbicort and took 1/2 oxycodone with some improvement.   He saw the pulm NP yesterday - his O2 level was 83 just walking to the waiting room.  He had a PE in March and he was placed on Eliquis but after 6 months they took him off and he been back on Eliquis for several weeks.  Yesterday at the visit they gave him a breathing treatment and wanted to do a direct admission and so told him to come to the ER.  He was too tired to come last night so he waited and came today.  They did not give him antibiotics or steroids yesterday and he does not have a rescue inhaler at home.  No wheezing or coughing.   ED Course:  SOB, hypoxia.  Likely COPD exacerbation.  SOB x 1 week, 2 severe episodes.  O2 down to 85% at rest.  Scanned for PE, but Eliquis compliance.    Review of Systems: As per HPI; otherwise review of systems reviewed and negative.   Ambulatory Status:  Ambulates without assistance  Past Medical History:  Diagnosis Date  . Alcohol dependence (HCC) 09/07/2012  . Anemia   . Esophagitis 09/07/2012   Per EGD 04/2011  . Gastritis 09/07/2012   Per EGD  04/2011  . Headache    4 - 5 a yr (no migraines)  . Hypertension    takes Amlodipine,Labetalol,and Clonidine daily  . Ischemic chest pain (HCC)   . Obesity   . Pulmonary emboli (HCC)   . Sleep apnea    pt states he is on bipap at night for sleep apnea  . Thrombocytopenia (HCC) 09/07/2012   Hx of in past.  . Tobacco abuse     Past Surgical History:  Procedure Laterality Date  . COLONOSCOPY    . KNEE ARTHROSCOPY Left 09/18/2013   Procedure: ARTHROSCOPY KNEE, PARTIAL MEDIAL AND LATERAL MENISCECTOMY, CHONDROPLASTY PATELLA FEMORAL JOINT;  Surgeon: Jeremy Junior, MD;  Location: [facility];  Service: Orthopedics;  Laterality: Left;  . Rigth Knee Arthroscopy  06/20/2011  . TONSILLECTOMY     as a child  . TOTAL KNEE ARTHROPLASTY Left 03/19/2014   Procedure: LEFT TOTAL KNEE ARTHROPLASTY;  Surgeon: Jeremy Junior, MD;  Location: [facility] OR;  Service: Orthopedics;  Laterality: Left;  . TOTAL KNEE ARTHROPLASTY Right 10/15/2014   Procedure: RIGHT TOTAL KNEE ARTHROPLASTY;  Surgeon: Jeremy Junior, MD;  Location: [facility] OR;  Service: Orthopedics;  Laterality: Right;  . UPPER GI ENDOSCOPY      Social History   Socioeconomic History  . Marital status: Divorced    Spouse  name: Not on file  . Number of children: Not on file  . Years of education: Not on file  . Highest education level: Not on file  Occupational History  . 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 Financial resource strain: Not on file  . Food insecurity:    Worry: Not on file    Inability: Not on file  . Transportation needs:    Medical: Not on file    Non-medical: Not on file  Tobacco Use  . Smoking status: Current Every Day Smoker    Packs/day: 0.80    Years: 41.00    Pack years: 32.80    Types: Cigarettes    Last attempt to quit: 03/14/2013    Years since quitting: 5.7  . Smokeless tobacco: Never Used  Substance and Sexual Activity    . Alcohol use: Not Currently    Alcohol/week: 1.0 standard drinks    Types: 1 Shots of liquor per week    Comment: PT drinks 1 Pint of vodka  2x a week ,04-26-18 last time  PT drank   . Drug use: No  . Sexual activity: Never  Lifestyle  . Physical activity:    Days per week: Not on file    Minutes per session: Not on file  . Stress: Not on file  Relationships  . Social connections:    Talks on phone: Not on file    Gets together: Not on file    Attends religious service: Not on file    Active member of club or organization: Not on file    Attends meetings of clubs or organizations: Not on file    Relationship status: Not on file  . Intimate partner violence:    Fear of current or ex partner: Not on file    Emotionally abused: Not on file    Physically abused: Not on file    Forced sexual activity: Not on file  Other Topics Concern  . Not on file  Social History Narrative  . Not on file    Allergies  Allergen Reactions  . Amlodipine Swelling    Pt cannot tolerate 10mg      History reviewed. No pertinent family history.  Prior to Admission medications   Medication Sig Start Date End Date Taking? Authorizing Provider  apixaban (ELIQUIS) 5 MG TABS tablet Take 1 tablet (5 mg total) by mouth 2 (two) times daily. 10/27/18   Jeremy Budge, MD  budesonide-formoterol (SYMBICORT) 160-4.5 MCG/ACT inhaler Inhale 2 puffs into the lungs 2 (two) times daily. 09/29/18   Jeremy Jeremy D, MD  cloNIDine (CATAPRES) 0.2 MG tablet Take 0.2 mg by mouth 2 (two) times daily.     [provider]  furosemide (LASIX) 40 MG tablet Take 40 mg by mouth daily.    [provider]  labetalol (NORMODYNE) 200 MG tablet Take 400 mg by mouth 2 (two) times daily.     [provider]  Multiple Vitamin (MULITIVITAMIN WITH MINERALS) TABS Take 1 tablet by mouth daily.     [provider]  oxyCODONE-acetaminophen (PERCOCET) 10-325 MG tablet Take 1 tablet by mouth every 8  (eight) hours as needed for pain (knees).     [provider]  telmisartan (MICARDIS) 40 MG tablet Take 40 mg by mouth daily. 04/09/18   [provider]    Physical Exam: Vitals:   12/03/18 0830 12/03/18 1052 12/03/18  1130 12/03/18 1200  BP: (!) 169/38  (!) 228/93 (!) 236/92  Pulse: (!) 54 (!) 56 (!) 51 (!) 51  Resp: 16 18 (!) 23 17  Temp:      TempSrc:      SpO2: 93% 96% 99% 94%  Weight:      Height:         General:  Appears calm and comfortable and is NAD Eyes:  PERRL, EOMI, normal lids, iris ENT:  grossly normal hearing, lips & tongue, mmm Neck:  no LAD, masses or thyromegaly Cardiovascular:  RRR, no m/r/g. 2-3+ LE edema.  Respiratory:   CTA bilaterally with no wheezes/rales/rhonchi, ?scant crackles in RLL.  Normal respiratory effort.  Good air movement. Abdomen:  soft, NT, ND, NABS Back:   normal alignment, no CVAT Skin:  no rash or induration seen on limited exam Musculoskeletal:  grossly normal tone BUE/BLE, good ROM, no bony abnormality Psychiatric:  grossly normal mood and affect, speech fluent and appropriate, AOx3 Neurologic:  CN 2-12 grossly intact, moves all extremities in coordinated fashion, sensation intact    Radiological Exams on Admission: Ct Angio Chest Pe W/cm &/or Wo Cm  Result Date: 12/03/2018 CLINICAL DATA:  Shortness of breath. History of previous pulmonary embolus. EXAM: CT ANGIOGRAPHY CHEST WITH CONTRAST TECHNIQUE: Multidetector CT imaging of the chest was performed using the standard protocol during bolus administration of intravenous contrast. Multiplanar CT image reconstructions and MIPs were obtained to evaluate the vascular anatomy. CONTRAST:  ISOVUE-370 IOPAMIDOL (ISOVUE-370) INJECTION 76% COMPARISON:  Chest CT Apr 27, 2018 and chest CT March 14, 2018; chest radiograph December 03, 2018 FINDINGS: Cardiovascular: There is no demonstrable pulmonary embolus. There is no thoracic aortic aneurysm or dissection. Visualized great  vessels appear normal except for mild calcification at the origins of the right innominate and left common carotid arteries. There is aortic atherosclerosis as well as foci of coronary artery calcification. There's no pericardial effusion or pericardial thickening. There is a degree of left ventricular hypertrophy. Mediastinum/Nodes: Thyroid appears normal. There are multiple prominent mediastinal lymph nodes, slightly increased from a prior study. There is a lymph node to the left of the aortic arch measuring 3.2 x 1.6 cm. A lymph node in the aortopulmonary window region measures 1.7 x 1.4 cm. There is a lymph node anterior to the distal trachea measuring 1.7 x 1.7 cm. A lymph node to the left of the distal trachea near the carina measures 1.6 x 1.3 cm. There is a lymph node in the subcarinal region measuring 1.6 x 1.3 cm. Multiple smaller mediastinal lymph nodes are also evident. No esophageal lesions are evident. Lungs/Pleura: Patchy atelectatic changes noted throughout the lungs bilaterally. The lungs show a somewhat mosaic attenuation at multiple sites, felt to be indicative of underlying small airways obstructive disease. There is no frank consolidation. There is no appreciable pleural effusion or pleural thickening. On axial slice 53 series 6, there is a nodular opacity abutting the pleura in the medial segment of the right middle lobe measuring 7 x 6 mm. This nodular opacity was present previously and appears equivocally larger currently. There is a small calcified granuloma in the anterior segment of the left upper lobe. Upper Abdomen: Visualized upper abdominal structures appear unremarkable except for aortic and major mesenteric arterial atherosclerosis. Musculoskeletal: There is degenerative change in the thoracic spine with diffuse idiopathic skeletal hyperostosis. There are no blastic or lytic bone lesions. No chest wall lesions evident. Review of the MIP images confirms the above findings. IMPRESSION:  1. No demonstrable pulmonary embolus. No thoracic aortic aneurysm or dissection evident. There is aortic atherosclerosis. There are foci of coronary artery and great vessel/major mesenteric arterial vessel calcification. 2.  Left ventricular hypertrophy. 3. Probable small airways obstructive disease bilaterally, characterized by mosaic attenuation in the lungs. Areas of patchy atelectasis noted. No frank consolidation. 4. 7 x 6 mm nodular opacity in the medial segment right middle lobe. This nodular opacity is equivocally larger than on prior studies. Non-contrast chest CT at 6-12 months is recommended. If the nodule is stable at time of repeat CT, then future CT at 18-24 months (from today's scan) is considered optional for low-risk patients, but is recommended for high-risk patients. This recommendation follows the consensus statement: Guidelines for Management of Incidental Pulmonary Nodules Detected on CT Images: From the Fleischner Society 2017; Radiology 2017; 284:228-243. 5. Multifocal adenopathy, with increase in lymph node size at several sites compared to prior study. Etiology for this finding uncertain. An underlying neoplastic etiology for this finding must be of concern. Nuclear medicine PET study to assess for abnormal metabolic activity of these lymph nodes may be warranted. 6. Diffuse idiopathic skeletal hyperostosis in portions of the thoracic spine. Aortic Atherosclerosis (ICD10-I70.0). Electronically Signed   By: Bretta Bang III M.Douglas.   On: 12/03/2018 09:26   Dg Chest Portable 1 View  Result Date: 12/03/2018 CLINICAL DATA:  Shortness of breath EXAM: PORTABLE CHEST 1 VIEW COMPARISON:  Chest radiograph and chest CT Apr 27, 2018 FINDINGS: There is borderline cardiomegaly with pulmonary vascularity normal. No edema or consolidation. No evident adenopathy. No evident bone lesions. IMPRESSION: Borderline cardiac enlargement.  No edema or consolidation. Electronically Signed   By: Bretta Bang III M.Douglas.   On: 12/03/2018 08:03    EKG: Independently reviewed.  NSR with rate 59; nonspecific ST changes with no evidence of acute ischemia   Labs on Admission: I have personally reviewed the available labs and imaging studies at the time of the admission.  Pertinent labs:   CO2 21 Glucose 153 BUN 22/Creatinine 1.47/GFR 51; baseline creatinine 1.2-1.3 BNP 831.1; no prior Troponin 0.03 WBC 9.6 Hgb 10.3; 12.0 in 5/19 MCV 62.7   Assessment/Plan Principal Problem:   Acute on chronic respiratory failure with hypoxia (HCC) Active Problems:   Essential hypertension   Obstructive sleep apnea   Alcohol dependence (HCC)   Anemia   Morbid obesity (HCC)   Pulmonary embolism (HCC)   Chronic pain   COPD mixed type (HCC)   Tobacco abuse   CKD (chronic kidney disease) stage 3, GFR 30-59 ml/min (HCC)   Elevated brain natriuretic peptide (BNP) level    Acute on chronic respiratory failure associated with a COPD exacerbation -Patient's shortness of breath is most likely caused by acute COPD exacerbation, as per pulmonary office visit yesterday -Additional considerations include PE (ruled out by CTA in ER, see below); CHF (see below); and malignancy -It is possible that the patient has progressed to O2-dependence -He does not have fever or leukocytosis.  -Chest x-ray is not consistent with pneumonia -CTA performed which was negative for PE but shows a medial RML nodule which is recommended for f/u CT in 6-12 months.   -However, he also has worsening multifocal LAD which is concerning for "underlying neoplastic etiology" -will observe patient for now -Nebulizers: scheduled Duoneb and prn albuterol -Solu-Medrol 60 mg IV BID  -No antibiotics at this time -Continue Dulera (formulary substitution) -Pulmonary consult requested  H/o PE -PE in 3/19 -He had a period off  Eliquis but this was restarted several weeks ago because his hypercoagulable panel post-PE indicated increased  risk of clotting -This is to be discussed further at his pulm appt in 2/20  Elevated BNP -Patient does not have known CHF but does have LE edema and an elevated BNP  -His last echo was in 3/19 and was essentially normal other than related to the PE -Will repeat Echo -Will monitor overnight on telemetry -No Lasix for now - no edema noted on CXR or CTA  Obesity -BMI is 42.61 -Will need active attempts at diet/exercise, if possible  Chronic pain -I have reviewed this patient in the [place] Controlled Substances Reporting System.  He is receiving medications from only one provider and appears to be taking them as prescribed. -He is not at particularly high risk of opioid misuse, diversion, or overdose. -Will continue home Percocet  HTN -Continue Catapres, labetalol, telmisartan  OSA -Continue CPAP  Anemia -Worse than prior -Appears to have iron deficiency anemia -Will trend to determine if evaluation/treatment is needed inpatient vs. outpatient  H/o ETOH dependence -Appears to be stable at this time without recurrence  Tobacco dependence -Tobacco Dependence: encourage cessation.   -This was discussed with the patient and should be reviewed on an ongoing basis.   -Patch ordered at patient request.     DVT prophylaxis: Lovenox  Code Status:  DNR - confirmed with patient Family Communication: None present Disposition Plan:  Home once clinically improved Consults called: Pulmonology; CM/SW/PT/OT/Nutrition/RT  Admission status: It is my clinical opinion that referral for OBSERVATION is reasonable and necessary in this patient based on the above information provided. The aforementioned taken together are felt to place the patient at high risk for further clinical deterioration. However it is anticipated that the patient may be medically stable for discharge from the hospital within 24 to 48 hours.     Jonah BlueJennifer Fahd Galea MD [facility]  If note is complete, please contact  covering daytime or nighttime physician. [email or web address] Password [email or web address]  12/03/2018, 12:24 PM

## 2018-12-03 NOTE — ED Notes (Signed)
Admitting at bedside 

## 2018-12-03 NOTE — ED Provider Notes (Signed)
[facility] Provider [facility]   CSN: [id number] Arrival date & time: 12/03/18  [id number]     History   Chief Complaint Chief Complaint  Patient presents with  . Shortness of Breath    HPI Jeremy Douglas is a 62 y.o. male.  HPI  62 year old male with a history of COPD and prior pulmonary embolism with current tobacco abuse presents with shortness of breath.  Has been progressive more than typical for about a week.  A couple days, including 2 days ago he had a significant dyspnea for about 30 minutes at a time.  Saw his pulmonologist yesterday and was told to come to the hospital.  He states they told him that they think he has another pulmonary embolism.  He states it does feel like the prior PE but worse.  Significant dyspnea on ambulation and when he was in the office yesterday his sats went down to 83%.  He does not wear oxygen.   He states he does not have any significant cough.  No chest pain but he did have some indigestion the other day.  He has had bilateral lower extremity swelling, left greater than right.  This is worse than typical but the asymmetric swelling is pretty typical for him.  He was off of Eliquis for about 5 weeks total a couple months ago for testing of his blood.  However he is now back on the Eliquis and has been compliant, most recently taking this morning.  Past Medical History:  Diagnosis Date  . Alcohol dependence (HCC) 09/07/2012  . Anemia   . Esophagitis 09/07/2012   Per EGD 04/2011  . Gastritis 09/07/2012   Per EGD 04/2011  . Headache    4 - 5 a yr (no migraines)  . Hypertension    takes Amlodipine,Labetalol,and Clonidine daily  . Ischemic chest pain (HCC)   . Obesity   . Pulmonary emboli (HCC)   . Sleep apnea    pt states he is on bipap at night for sleep apnea  . Thrombocytopenia (HCC) 09/07/2012   Hx of in past.  . Tobacco abuse     Patient Active Problem List   Diagnosis Date Noted  . Acute on chronic  respiratory failure with hypoxia (HCC) 12/03/2018  . Beta thalassemia minor 03/21/2018  . Shortness of breath 03/14/2018  . Pulmonary embolism (HCC) 03/14/2018  . Chronic pain 03/14/2018  . Microcytic anemia 03/14/2018  . COPD mixed type (HCC) 03/14/2018  . Primary osteoarthritis of right knee 10/15/2014  . Osteoarthritis of left knee 03/19/2014  . Morbid obesity (HCC) 03/19/2014  . Anemia 09/09/2012  . Depression 09/09/2012  . Transaminitis 09/08/2012  . Tylenol overdose 09/07/2012  . Suicidal overdose (HCC) 09/07/2012  . Hypertensive urgency 09/07/2012  . Hypokalemia 09/07/2012  . Alcohol dependence (HCC) 09/07/2012  . Gastritis 09/07/2012  . Esophagitis 09/07/2012  . Tobacco use disorder, moderate, in sustained remission 09/10/2008  . Essential hypertension 09/10/2008  . Obstructive sleep apnea 09/10/2008    Past Surgical History:  Procedure Laterality Date  . COLONOSCOPY    . KNEE ARTHROSCOPY Left 09/18/2013   Procedure: ARTHROSCOPY KNEE, PARTIAL MEDIAL AND LATERAL MENISCECTOMY, CHONDROPLASTY PATELLA FEMORAL JOINT;  Surgeon: [name] Graves, MD;  Location:  SURGERY CENTER;  Service: Orthopedics;  Laterality: Left;  . Rigth Knee Arthroscopy  06/20/2011  . TONSILLECTOMY     as a child  . TOTAL KNEE ARTHROPLASTY Left 03/19/2014   Procedure: LEFT TOTAL KNEE ARTHROPLASTY;  Surgeon:  Harvie Junior, MD;  Location: [facility] OR;  Service: Orthopedics;  Laterality: Left;  . TOTAL KNEE ARTHROPLASTY Right 10/15/2014   Procedure: RIGHT TOTAL KNEE ARTHROPLASTY;  Surgeon: Harvie Junior, MD;  Location: [facility] OR;  Service: Orthopedics;  Laterality: Right;  . UPPER GI ENDOSCOPY          Home Medications    Prior to Admission medications   Medication Sig Start Date End Date Taking? Authorizing Provider  apixaban (ELIQUIS) 5 MG TABS tablet Take 1 tablet (5 mg total) by mouth 2 (two) times daily. 10/27/18  Yes Young, Joni Fears D, MD  budesonide-formoterol [facility]) 160-4.5 MCG/ACT inhaler  Inhale 2 puffs into the lungs 2 (two) times daily. 09/29/18  Yes Young, Joni Fears D, MD  cloNIDine (CATAPRES) 0.2 MG tablet Take 0.2 mg by mouth 2 (two) times daily.    Yes [provider]  furosemide (LASIX) 40 MG tablet Take 40 mg by mouth daily.   Yes [provider]  labetalol (NORMODYNE) 200 MG tablet Take 400 mg by mouth 2 (two) times daily.    Yes [provider]  Melatonin 5 MG TABS Take 5 mg by mouth as needed.   Yes [provider]  Multiple Vitamin (MULITIVITAMIN WITH MINERALS) TABS Take 1 tablet by mouth daily.    Yes [provider]  OVER THE COUNTER MEDICATION CPAP   Yes [provider]  oxyCODONE-acetaminophen (PERCOCET) 10-325 MG tablet Take 1 tablet by mouth every 8 (eight) hours as needed for pain (knees).    Yes [provider]  telmisartan (MICARDIS) 40 MG tablet Take 40 mg by mouth at bedtime.  04/09/18  Yes [provider]  tiZANidine (ZANAFLEX) 4 MG tablet Take 4 mg by mouth as needed. 11/25/18  Yes [provider]    Family History No family history on file.  Social History Social History   Tobacco Use  . Smoking status: Current Some Day Smoker    Packs/day: 0.80    Years: 31.00    Pack years: 24.80    Types: Cigarettes    Last attempt to quit: 03/14/2013    Years since quitting: 5.7  . Smokeless tobacco: Never Used  . Tobacco comment: 4-5 cigarettes/day 06/16/18  Substance Use Topics  . Alcohol use: Yes    Alcohol/week: 1.0 standard drinks    Types: 1 Shots of liquor per week    Comment: PT drinks 1 Pint of vodka  2x a week ,04-26-18 last time  PT drank   . Drug use: No     Allergies   Amlodipine   Review of Systems Review of Systems  Respiratory: Positive for shortness of breath. Negative for cough.   Cardiovascular: Positive for leg swelling. Negative for chest pain.  All other systems reviewed and are negative.    Physical Exam Updated Vital Signs BP (!) 169/38    Pulse (!) 54   Temp 98.2 F (36.8 C) (Oral)   Resp 16   Ht 5' 10.5" (1.791 m)   Wt (!) 136.6 kg   SpO2 93%   BMI 42.61 kg/m   Physical Exam  Constitutional: He appears well-developed and well-nourished.  Non-toxic appearance. He does not appear ill. No distress.  Morbidly obese  HENT:  Head: Normocephalic and atraumatic.  Right Ear: External ear normal.  Left Ear: External ear normal.  Nose: Nose normal.  Eyes: Right eye exhibits no discharge. Left eye exhibits no discharge.  Neck: Neck supple.  Cardiovascular: Normal rate, regular rhythm and  normal heart sounds.  Pulmonary/Chest: Effort normal. Tachypnea noted. He has decreased breath sounds in the right lower field and the left lower field.  Abdominal: Soft. There is no tenderness.  Musculoskeletal:       Right lower leg: He exhibits edema (mild).       Left lower leg: He exhibits edema (mild).  Neurological: He is alert.  Skin: Skin is warm and dry.  Psychiatric: His mood appears not anxious.  Nursing note and vitals reviewed.    ED Treatments / Results  Labs (all labs ordered are listed, but only abnormal results are displayed) Labs Reviewed  BASIC METABOLIC PANEL - Abnormal; Notable for the following components:      Result Value   CO2 21 (*)    Glucose, Bld 153 (*)    Creatinine, Ser 1.47 (*)    GFR calc non Af Amer 51 (*)    GFR calc Af Amer 59 (*)    All other components within normal limits  TROPONIN I - Abnormal; Notable for the following components:   Troponin I 0.03 (*)    All other components within normal limits  BRAIN NATRIURETIC PEPTIDE - Abnormal; Notable for the following components:   B Natriuretic Peptide 831.1 (*)    All other components within normal limits  CBC WITH DIFFERENTIAL/PLATELET - Abnormal; Notable for the following components:   Hemoglobin 10.3 (*)    HCT 34.9 (*)    MCV 62.7 (*)    MCH 18.5 (*)    MCHC 29.5 (*)    RDW 15.9 (*)    All other components within normal limits     EKG EKG Interpretation  Date/Time:  Wednesday December 03 2018 07:30:18 EST Ventricular Rate:  59 PR Interval:    QRS Duration: 112 QT Interval:  464 QTC Calculation: 460 R Axis:   2 Text Interpretation:  Sinus rhythm Probable left atrial enlargement Incomplete right bundle branch block Nonspecific T abnormalities, lateral leads Baseline wander in lead(s) V2 nonspecific T waves similar to May 2019 Confirmed by Pricilla Loveless [phone]) on 12/03/2018 7:34:40 AM   Radiology Ct Angio Chest Pe W/cm &/or Wo Cm  Result Date: 12/03/2018 CLINICAL DATA:  Shortness of breath. History of previous pulmonary embolus. EXAM: CT ANGIOGRAPHY CHEST WITH CONTRAST TECHNIQUE: Multidetector CT imaging of the chest was performed using the standard protocol during bolus administration of intravenous contrast. Multiplanar CT image reconstructions and MIPs were obtained to evaluate the vascular anatomy. CONTRAST:  ISOVUE-370 IOPAMIDOL (ISOVUE-370) INJECTION 76% COMPARISON:  Chest CT Apr 27, 2018 and chest CT March 14, 2018; chest radiograph December 03, 2018 FINDINGS: Cardiovascular: There is no demonstrable pulmonary embolus. There is no thoracic aortic aneurysm or dissection. Visualized great vessels appear normal except for mild calcification at the origins of the right innominate and left common carotid arteries. There is aortic atherosclerosis as well as foci of coronary artery calcification. There's no pericardial effusion or pericardial thickening. There is a degree of left ventricular hypertrophy. Mediastinum/Nodes: Thyroid appears normal. There are multiple prominent mediastinal lymph nodes, slightly increased from a prior study. There is a lymph node to the left of the aortic arch measuring 3.2 x 1.6 cm. A lymph node in the aortopulmonary window region measures 1.7 x 1.4 cm. There is a lymph node anterior to the distal trachea measuring 1.7 x 1.7 cm. A lymph node to the left of the distal trachea near the  carina measures 1.6 x 1.3 cm. There is a lymph node in the  subcarinal region measuring 1.6 x 1.3 cm. Multiple smaller mediastinal lymph nodes are also evident. No esophageal lesions are evident. Lungs/Pleura: Patchy atelectatic changes noted throughout the lungs bilaterally. The lungs show a somewhat mosaic attenuation at multiple sites, felt to be indicative of underlying small airways obstructive disease. There is no frank consolidation. There is no appreciable pleural effusion or pleural thickening. On axial slice 53 series 6, there is a nodular opacity abutting the pleura in the medial segment of the right middle lobe measuring 7 x 6 mm. This nodular opacity was present previously and appears equivocally larger currently. There is a small calcified granuloma in the anterior segment of the left upper lobe. Upper Abdomen: Visualized upper abdominal structures appear unremarkable except for aortic and major mesenteric arterial atherosclerosis. Musculoskeletal: There is degenerative change in the thoracic spine with diffuse idiopathic skeletal hyperostosis. There are no blastic or lytic bone lesions. No chest wall lesions evident. Review of the MIP images confirms the above findings. IMPRESSION: 1. No demonstrable pulmonary embolus. No thoracic aortic aneurysm or dissection evident. There is aortic atherosclerosis. There are foci of coronary artery and great vessel/major mesenteric arterial vessel calcification. 2.  Left ventricular hypertrophy. 3. Probable small airways obstructive disease bilaterally, characterized by mosaic attenuation in the lungs. Areas of patchy atelectasis noted. No frank consolidation. 4. 7 x 6 mm nodular opacity in the medial segment right middle lobe. This nodular opacity is equivocally larger than on prior studies. Non-contrast chest CT at 6-12 months is recommended. If the nodule is stable at time of repeat CT, then future CT at 18-24 months (from today's scan) is considered optional for  low-risk patients, but is recommended for high-risk patients. This recommendation follows the consensus statement: Guidelines for Management of Incidental Pulmonary Nodules Detected on CT Images: From the Fleischner Society 2017; Radiology 2017; 284:228-243. 5. Multifocal adenopathy, with increase in lymph node size at several sites compared to prior study. Etiology for this finding uncertain. An underlying neoplastic etiology for this finding must be of concern. Nuclear medicine PET study to assess for abnormal metabolic activity of these lymph nodes may be warranted. 6. Diffuse idiopathic skeletal hyperostosis in portions of the thoracic spine. Aortic Atherosclerosis (ICD10-I70.0). Electronically Signed   By: Bretta Bang III M.D.   On: 12/03/2018 09:26   Dg Chest Portable 1 View  Result Date: 12/03/2018 CLINICAL DATA:  Shortness of breath EXAM: PORTABLE CHEST 1 VIEW COMPARISON:  Chest radiograph and chest CT Apr 27, 2018 FINDINGS: There is borderline cardiomegaly with pulmonary vascularity normal. No edema or consolidation. No evident adenopathy. No evident bone lesions. IMPRESSION: Borderline cardiac enlargement.  No edema or consolidation. Electronically Signed   By: Bretta Bang III M.D.   On: 12/03/2018 08:03    Procedures Procedures (including critical care time)  Medications Ordered in ED Medications  albuterol (PROVENTIL) (2.5 MG/3ML) 0.083% nebulizer solution 5 mg (5 mg Nebulization Given 12/03/18 0737)  iopamidol (ISOVUE-370) 76 % injection 100 mL (100 mLs Intravenous Contrast Given 12/03/18 0903)     Initial Impression / Assessment and Plan / ED Course  I have reviewed the triage vital signs and the nursing notes.  Pertinent labs & imaging results that were available during my care of the patient were reviewed by me and considered in my medical decision making (see chart for details).     Patient intermittently becomes hypoxic at rest.  While he is symptomatically  feeling better his sats will occasionally go down to about 86% on room air.  He does not have oxygen at home.  I think it is reasonable to admit him for further respiratory work-up and to place on oxygen.  He may need oxygen long-term and this may just be a progression in his COPD/chronic respiratory failure.  CT does not show an obvious pneumonia, PE.  Dr. Ophelia Charter consulted and will admit.  Final Clinical Impressions(s) / ED Diagnoses   Final diagnoses:  Acute on chronic respiratory failure with hypoxia [facility])    ED Discharge Orders    None       Pricilla Loveless, MD 12/03/18 1030

## 2018-12-03 NOTE — ED Notes (Signed)
Pt returned from Vascular.  He is sitting on the side of the bed.  Denies any complaints.

## 2018-12-03 NOTE — ED Notes (Signed)
Patient transported to CT 

## 2018-12-03 NOTE — Progress Notes (Signed)
Echocardiogram 2D Echocardiogram has been performed.  12/03/2018 3:07 PM Gertie FeyMichelle Milca Sytsma, MHA, RVT, RDCS, RDMS

## 2018-12-03 NOTE — Consult Note (Addendum)
NAME:  Jeremy PlowmanRoger C Douglas, MRN:  [id number], DOB:  November 01, 1956, LOS: 0 ADMISSION DATE:  12/03/2018, CONSULTATION DATE:  12/11 REFERRING MD: Ophelia CharterYates, CHIEF COMPLAINT: Acute dyspnea  Brief History   62 year old male patient admitted on 12/10 with chief complaint of 20 pound weight gain, and intermittent acute shortness of breath.  CT of chest negative for pulmonary emboli, did have left ventricular hypertrophy, interstitial changes, and diffuse adenopathy because of this pulmonary was asked to evaluate. History of present illness   62 year old male patient with a history as mentioned below, he initially presented to the pulmonary office on 12/10 with chief complaint of Worsening shortness of breath.  The shortness of breath onset was approximately 2 weeks prior to this visit, and he reported as being rather sudden onset, and primarily exertional in nature.  He would rest and it would eventually subside, the onset would be after approximately walking 100 feet.  He denied sick exposure, headache, nasal discharge, sore throat, nasal congestion, or cough associated with this.  He has had intermittent nonspecific chest discomfort he thought could be related to reflux however he was not able to tell if rest or Tums resolved this discomfort he described it is primarily subxiphoid.  Over the last 2 weeks he has had more frequent episodes of worsening shortness of breath this would be rather sudden in nature.  In our office on the 10th his pulse oximetry was recorded at 83% on room air.  Of note he has been intermittently vaping, he does not vape THC, nor use any flavored vaping devices.  We offered him hospitalization on the 10th however he declined feeling he was too tired and wanted to go home.  He presented to the emergency room once again on 12/10 with the same complaints as mentioned above.  A CT scan was obtained this demonstrated diffuse interstitial changes as well as diffuse mediastinal adenopathy.  His BNP was  elevated at over 800.  He has had over 20 pounds of weight gain in the last 2 weeks.  He was admitted with a working diagnosis of acute hypoxic respiratory failure pulmonary has been asked to evaluate and assist with his management in regards to both the new onset of shortness of breath as well as to comment on his abnormal CT chest  Past Medical History  Tobacco abuse, moderate COPD with FEV1 64% predicted, obstructive sleep apnea on CPAP, obesity weight 311, pulmonary emboli with positive hypercoagulable panel on Eliquis since March 2019, hypertension, esophagitis  Significant Hospital Events     Consults:  Pulmonary consulted  Procedures:    Significant Diagnostic Tests:    Micro Data:  CT chest 12/10: 1. No demonstrable pulmonary embolus. No thoracic aortic aneurysm or dissection evident. There is aortic atherosclerosis. There are foci of coronary artery and great vessel/major mesenteric arterial vessel calcification 2.  Left ventricular hypertrophy. 3. Probable small airways obstructive disease bilaterally, characterized by mosaic attenuation in the lungs. Areas of patchy atelectasis noted. No frank consolidation. 4. 7 x 6 mm nodular opacity in the medial segment right middle lobe. This nodular opacity is equivocally larger than on prior studies. 5. Multifocal adenopathy, with increase in lymph node size at several sites compared to prior study. 6. Diffuse idiopathic skeletal hyperostosis in portions of the thoracic spine.  Aortic Atherosclerosis (ICD10-I70.0).  Antimicrobials:    Interim history/subjective:  No distress Objective   Blood pressure (Abnormal) 208/80, pulse (Abnormal) 55, temperature 98.2 F (36.8 C), temperature source Oral, resp. rate (Abnormal) 21,  height 5' 10.5" (1.791 m), weight (Abnormal) 136.6 kg, SpO2 93 %.       No intake or output data in the 24 hours ending 12/03/18 1333 Filed Weights   12/03/18 0721  Weight: (Abnormal) 136.6 kg     Examination: General: Obese 62 year old white male currently resting up in bed he is in no acute distress HENT: Normocephalic atraumatic no jugular venous distention Lungs: Diminished bases no accessory use Cardiovascular: Regular rate and rhythm Abdomen: Soft nontender Extremities: Marked left greater than right lower extremity edema Neuro: Awake oriented no focal deficits GU: Voids clear yellow  Resolved Hospital Problem list     Assessment & Plan:  Acute hypoxic respiratory failure.  Suspect this is primarily secondary to pulmonary edema and volume overload.  Presenting symptoms are not consistent with a typical COPD exacerbation, and CT changes as well as weight gain would favor this. Plan/recommendation Admit Titrate oxygen for saturations greater than 92% IV Lasix Echocardiogram, cycle BNP and troponin I Continue scheduled bronchodilators We will discontinue systemic steroids, as no evidence of bronchospasm currently  mediastinal lymphadenopathy.  Most likely secondary to volume excess Plan IV Lasix Repeat edging in 3 months, could consider further evaluation after cardiac etiology ruled out  COPD No current evidence of exacerbation Plan Continue bronchodilators  Hypertension Plan Continue home medications  Obstructive sleep apnea Plan CPAP at at bedtime  H/o PE and hypercoagulable state Plan Cont DOAC   CKD Plan Lasix Trend chemistry  Chronic anemia without evidence of bleeding Plan Trend CBC  Diabetes with hyperglycemia  Plan Insulin     Best practice:  Diet: NAS Pain/Anxiety/Delirium protocol (if indicated): NA VAP protocol (if indicated): NA DVT prophylaxis: DOAC GI prophylaxis: PPI Glucose control: SSI Mobility: OOB Code Status: full code Family Communication: pending Disposition:   Labs   CBC: Recent Labs  Lab 12/03/18 0733  WBC 9.6  NEUTROABS 7.4  HGB 10.3*  HCT 34.9*  MCV 62.7*  PLT 240    Basic Metabolic  Panel: Recent Labs  Lab 12/03/18 0733  NA 138  K 3.8  CL 103  CO2 21*  GLUCOSE 153*  BUN 22  CREATININE 1.47*  CALCIUM 9.6   GFR: Estimated Creatinine Clearance: 74 mL/min (A) (by C-G formula based on SCr of 1.47 mg/dL (H)). Recent Labs  Lab 12/03/18 0733  WBC 9.6    Liver Function Tests: No results for input(s): AST, ALT, ALKPHOS, BILITOT, PROT, ALBUMIN in the last 168 hours. No results for input(s): LIPASE, AMYLASE in the last 168 hours. No results for input(s): AMMONIA in the last 168 hours.  ABG No results found for: PHART, PCO2ART, PO2ART, HCO3, TCO2, ACIDBASEDEF, O2SAT   Coagulation Profile: No results for input(s): INR, PROTIME in the last 168 hours.  Cardiac Enzymes: Recent Labs  Lab 12/03/18 0733  TROPONINI 0.03*    HbA1C: Hgb A1c MFr Bld  Date/Time Value Ref Range Status  03/14/2018 11:56 PM 5.1 4.8 - 5.6 % Final    Comment:    (NOTE) Pre diabetes:          5.7%-6.4% Diabetes:              >6.4% Glycemic control for   <7.0% adults with diabetes   04/28/2011 07:00 AM  <5.7 % Final   5.6 (NOTE)  According to the ADA Clinical Practice Recommendations for 2011, when HbA1c is used as a screening test:   >=6.5%   Diagnostic of Diabetes Mellitus           (if abnormal result  is confirmed)  5.7-6.4%   Increased risk of developing Diabetes Mellitus  References:Diagnosis and Classification of Diabetes Mellitus,Diabetes Care,2011,34(Suppl 1):S62-S69 and Standards of Medical Care in         Diabetes - 2011,Diabetes Care,2011,34  (Suppl 1):S11-S61.    CBG: No results for input(s): GLUCAP in the last 168 hours.  Review of Systems:   Review of Systems - History obtained from the patient General ROS: positive for  - fatigue, weight gain and weight loss negative for - chills, fever, hot flashes or night sweats Psychological ROS: negative ENT ROS: negative for - epistaxis, headaches, hearing  change, nasal congestion, nasal discharge, nasal polyps, oral lesions, sinus pain, sneezing or sore throat Hematological and Lymphatic ROS: negative Endocrine ROS: negative Respiratory ROS: positive for - shortness of breath negative for - cough, hemoptysis, pleuritic pain, sputum changes, tachypnea or wheezing Cardiovascular ROS: positive for - chest pain Gastrointestinal ROS: no abdominal pain, change in bowel habits, or black or bloody stools Genito-Urinary ROS: no dysuria, trouble voiding, or hematuria Musculoskeletal ROS: negative Neurological ROS: no TIA or stroke symptoms   Past Medical History  He,  has a past medical history of Alcohol dependence (HCC) (09/07/2012), Anemia, Esophagitis (09/07/2012), Gastritis (09/07/2012), Headache, Hypertension, Ischemic chest pain (HCC), Obesity, Pulmonary emboli (HCC), Sleep apnea, Thrombocytopenia (HCC) (09/07/2012), and Tobacco abuse.   Surgical History    Past Surgical History:  Procedure Laterality Date  . COLONOSCOPY    . KNEE ARTHROSCOPY Left 09/18/2013   Procedure: ARTHROSCOPY KNEE, PARTIAL MEDIAL AND LATERAL MENISCECTOMY, CHONDROPLASTY PATELLA FEMORAL JOINT;  Surgeon: Harvie Junior, MD;  Location: [facility];  Service: Orthopedics;  Laterality: Left;  . Rigth Knee Arthroscopy  06/20/2011  . TONSILLECTOMY     as a child  . TOTAL KNEE ARTHROPLASTY Left 03/19/2014   Procedure: LEFT TOTAL KNEE ARTHROPLASTY;  Surgeon: Harvie Junior, MD;  Location: [facility] OR;  Service: Orthopedics;  Laterality: Left;  . TOTAL KNEE ARTHROPLASTY Right 10/15/2014   Procedure: RIGHT TOTAL KNEE ARTHROPLASTY;  Surgeon: Harvie Junior, MD;  Location: [facility] OR;  Service: Orthopedics;  Laterality: Right;  . UPPER GI ENDOSCOPY       Social History   reports that he has been smoking cigarettes. He has a 32.80 pack-year smoking history. He has never used smokeless tobacco. He reports that he drank about 1.0 standard drinks of alcohol per week. He reports that he  does not use drugs.   Family History   His family history is not on file.   Allergies Allergies  Allergen Reactions  . Amlodipine Swelling    Pt cannot tolerate 10mg       Home Medications  Prior to Admission medications   Medication Sig Start Date End Date Taking? Authorizing Provider  apixaban (ELIQUIS) 5 MG TABS tablet Take 1 tablet (5 mg total) by mouth 2 (two) times daily. 10/27/18  Yes Young, Joni Fears D, MD  budesonide-formoterol [facility]) 160-4.5 MCG/ACT inhaler Inhale 2 puffs into the lungs 2 (two) times daily. 09/29/18  Yes Young, Joni Fears D, MD  cloNIDine (CATAPRES) 0.2 MG tablet Take 0.2 mg by mouth 2 (two) times daily.    Yes [provider]  furosemide (LASIX) 40 MG tablet Take 40 mg by mouth daily.   Yes [provider]  labetalol (NORMODYNE) 200 MG tablet Take 400 mg by mouth 2 (two) times daily.    Yes [provider]  Melatonin 5 MG TABS Take 5 mg by mouth as needed.   Yes [provider]  Multiple Vitamin (MULITIVITAMIN WITH MINERALS) TABS Take 1 tablet by mouth daily.    Yes [provider]  OVER THE COUNTER MEDICATION CPAP   Yes [provider]  oxyCODONE-acetaminophen (PERCOCET) 10-325 MG tablet Take 1 tablet by mouth every 8 (eight) hours as needed for pain (knees).    Yes [provider]  telmisartan (MICARDIS) 40 MG tablet Take 40 mg by mouth at bedtime.  04/09/18  Yes [provider]  tiZANidine (ZANAFLEX) 4 MG tablet Take 4 mg by mouth as needed. 11/25/18  Yes [provider]    Simonne Martinet ACNP-BC [facility] Pulmonary/Critical Care Pager # [phone] OR # [phone] if no answer  Attending Note:  62 year old male with history of COPD presenting with hypoxemia and mediastinal LAN.  On exam, decreased BS in the bases.  Chest CT that I reviewed myself, with very large pulmonary arteries noted.  Discussed with PCCM-NP.  SOB: related to heart failure rather than COPD  - Diureses  -  Consider cards consult  - 2D echo  - No need for steroids  Hypoxemia:  - Titrate O2 for sat of 88-92%  - May need an ambulatory desaturation study prior to discharge for ?home O2  COPD:  - Albuterol  - No need or steroids  - Dulera  LAN: no need for inpatient work up can be deferred to inpatient once cardiac situation is addressed.  PCCM will sign off, please call back if needed  Patient seen and examined, agree with above note.  I dictated the care and orders written for this patient under my direction.  Alyson Reedy, MD [phone]

## 2018-12-03 NOTE — Progress Notes (Signed)
Patient has his own home cpap machine. No help needed he states.

## 2018-12-03 NOTE — ED Triage Notes (Signed)
Pt reports that he has been short of breath with minimal exertion for over a week now. On Friday night he was unable to catch his breath and then again on Monday. Pt saw his pulmonologist yesterday and he wanted the patient to be sent here. Pt states that he has a history of a previous pulmonary embolism.

## 2018-12-03 NOTE — ED Notes (Signed)
Transported to Vascular. °

## 2018-12-03 NOTE — Progress Notes (Signed)
PRN hydralazine ordered by MD for high blood pressure and given.

## 2018-12-04 ENCOUNTER — Observation Stay (HOSPITAL_COMMUNITY): Payer: BLUE CROSS/BLUE SHIELD

## 2018-12-04 ENCOUNTER — Encounter (HOSPITAL_COMMUNITY): Payer: Self-pay | Admitting: Cardiology

## 2018-12-04 DIAGNOSIS — R7989 Other specified abnormal findings of blood chemistry: Secondary | ICD-10-CM

## 2018-12-04 DIAGNOSIS — R001 Bradycardia, unspecified: Secondary | ICD-10-CM | POA: Diagnosis present

## 2018-12-04 DIAGNOSIS — I5033 Acute on chronic diastolic (congestive) heart failure: Secondary | ICD-10-CM | POA: Diagnosis not present

## 2018-12-04 DIAGNOSIS — D509 Iron deficiency anemia, unspecified: Secondary | ICD-10-CM | POA: Diagnosis present

## 2018-12-04 DIAGNOSIS — Z7951 Long term (current) use of inhaled steroids: Secondary | ICD-10-CM | POA: Diagnosis not present

## 2018-12-04 DIAGNOSIS — Z6841 Body Mass Index (BMI) 40.0 and over, adult: Secondary | ICD-10-CM | POA: Diagnosis not present

## 2018-12-04 DIAGNOSIS — Z7901 Long term (current) use of anticoagulants: Secondary | ICD-10-CM | POA: Diagnosis not present

## 2018-12-04 DIAGNOSIS — G4733 Obstructive sleep apnea (adult) (pediatric): Secondary | ICD-10-CM | POA: Diagnosis present

## 2018-12-04 DIAGNOSIS — F17210 Nicotine dependence, cigarettes, uncomplicated: Secondary | ICD-10-CM | POA: Diagnosis present

## 2018-12-04 DIAGNOSIS — I10 Essential (primary) hypertension: Secondary | ICD-10-CM | POA: Diagnosis not present

## 2018-12-04 DIAGNOSIS — I2510 Atherosclerotic heart disease of native coronary artery without angina pectoris: Secondary | ICD-10-CM

## 2018-12-04 DIAGNOSIS — E1165 Type 2 diabetes mellitus with hyperglycemia: Secondary | ICD-10-CM | POA: Diagnosis present

## 2018-12-04 DIAGNOSIS — R0602 Shortness of breath: Secondary | ICD-10-CM | POA: Diagnosis not present

## 2018-12-04 DIAGNOSIS — N183 Chronic kidney disease, stage 3 (moderate): Secondary | ICD-10-CM | POA: Diagnosis present

## 2018-12-04 DIAGNOSIS — Z86711 Personal history of pulmonary embolism: Secondary | ICD-10-CM | POA: Diagnosis not present

## 2018-12-04 DIAGNOSIS — I130 Hypertensive heart and chronic kidney disease with heart failure and stage 1 through stage 4 chronic kidney disease, or unspecified chronic kidney disease: Secondary | ICD-10-CM | POA: Diagnosis present

## 2018-12-04 DIAGNOSIS — E1122 Type 2 diabetes mellitus with diabetic chronic kidney disease: Secondary | ICD-10-CM | POA: Diagnosis present

## 2018-12-04 DIAGNOSIS — J441 Chronic obstructive pulmonary disease with (acute) exacerbation: Secondary | ICD-10-CM | POA: Diagnosis present

## 2018-12-04 DIAGNOSIS — G8929 Other chronic pain: Secondary | ICD-10-CM | POA: Diagnosis present

## 2018-12-04 DIAGNOSIS — Z96653 Presence of artificial knee joint, bilateral: Secondary | ICD-10-CM | POA: Diagnosis present

## 2018-12-04 DIAGNOSIS — E785 Hyperlipidemia, unspecified: Secondary | ICD-10-CM | POA: Diagnosis present

## 2018-12-04 DIAGNOSIS — J9621 Acute and chronic respiratory failure with hypoxia: Secondary | ICD-10-CM | POA: Diagnosis not present

## 2018-12-04 DIAGNOSIS — Z888 Allergy status to other drugs, medicaments and biological substances status: Secondary | ICD-10-CM | POA: Diagnosis not present

## 2018-12-04 DIAGNOSIS — Z66 Do not resuscitate: Secondary | ICD-10-CM | POA: Diagnosis present

## 2018-12-04 DIAGNOSIS — Z79899 Other long term (current) drug therapy: Secondary | ICD-10-CM | POA: Diagnosis not present

## 2018-12-04 DIAGNOSIS — R591 Generalized enlarged lymph nodes: Secondary | ICD-10-CM | POA: Diagnosis present

## 2018-12-04 LAB — BASIC METABOLIC PANEL
Anion gap: 14 (ref 5–15)
BUN: 24 mg/dL — ABNORMAL HIGH (ref 8–23)
CO2: 21 mmol/L — ABNORMAL LOW (ref 22–32)
Calcium: 9.6 mg/dL (ref 8.9–10.3)
Chloride: 104 mmol/L (ref 98–111)
Creatinine, Ser: 1.62 mg/dL — ABNORMAL HIGH (ref 0.61–1.24)
GFR calc Af Amer: 52 mL/min — ABNORMAL LOW (ref >60)
GFR calc non Af Amer: 45 mL/min — ABNORMAL LOW (ref >60)
GLUCOSE: 151 mg/dL — AB (ref 70–99)
POTASSIUM: 3.9 mmol/L (ref 3.5–5.1)
Sodium: 139 mmol/L (ref 135–145)

## 2018-12-04 LAB — HIV ANTIBODY (ROUTINE TESTING W REFLEX): HIV Screen 4th Generation wRfx: NONREACTIVE

## 2018-12-04 LAB — IRON AND TIBC
Iron: 51 ug/dL (ref 45–182)
Saturation Ratios: 14 % — ABNORMAL LOW (ref 17.9–39.5)
TIBC: 364 ug/dL (ref 250–450)
UIBC: 313 ug/dL

## 2018-12-04 LAB — VITAMIN B12: Vitamin B-12: 423 pg/mL (ref 180–914)

## 2018-12-04 LAB — FERRITIN: FERRITIN: 174 ng/mL (ref 24–336)

## 2018-12-04 LAB — TSH: TSH: 4.941 u[IU]/mL — ABNORMAL HIGH (ref 0.350–4.500)

## 2018-12-04 MED ORDER — TECHNETIUM TO 99M ALBUMIN AGGREGATED
4.0000 | Freq: Once | INTRAVENOUS | Status: AC | PRN
  Administered 2018-12-04: 4 via INTRAVENOUS

## 2018-12-04 MED ORDER — TECHNETIUM TC 99M DIETHYLENETRIAME-PENTAACETIC ACID
30.0000 | Freq: Once | INTRAVENOUS | Status: AC | PRN
  Administered 2018-12-04: 30 via RESPIRATORY_TRACT

## 2018-12-04 MED ORDER — IPRATROPIUM-ALBUTEROL 0.5-2.5 (3) MG/3ML IN SOLN: 3.0000 mL | Freq: Four times a day (QID) | RESPIRATORY_TRACT | Status: DC | PRN

## 2018-12-04 MED ORDER — HYDRALAZINE HCL 50 MG PO TABS
50.0000 mg | ORAL_TABLET | Freq: Three times a day (TID) | ORAL | Status: DC
  Administered 2018-12-04 – 2018-12-05 (×3): 50 mg via ORAL
  Filled 2018-12-04 (×3): qty 1

## 2018-12-04 NOTE — Progress Notes (Signed)
OT Cancellation Note  Patient Details Name: Jeremy Douglas MRN: [id number] DOB: 02-19-1956   Cancelled Treatment:    Reason Eval/Treat Not Completed: OT screened, no needs identified, will sign off  Galen ManilaSpencer, Kamyiah Colantonio Jeanette 12/04/2018, 11:55 AM

## 2018-12-04 NOTE — Consult Note (Signed)
Cardiology Consultation:   Patient ID: Jeremy Douglas MRN: [id number]; DOB: June 19, 1956  Admit date: 12/03/2018 Date of Consult: 12/04/2018  Primary Care Provider: Darrow Bussing, MD Primary Cardiologist: Nanetta Batty, MD  Primary Electrophysiologist:  None    Patient Profile:   Jeremy Douglas is a 62 y.o. male with a hx of PE on Eliquis, COPD, tobacco abuse, HTN, OSA on CPAP, obesity and h/o ETOH abuse who is being seen today for the evaluation of acute diastolic CHF, at the request of Dr. Nelson Chimes, Internal Medicine.   History of Present Illness:   Jeremy Douglas was seen once by Dr. Allyson Sabal in 2017 for CP evaluation. His cardiac risk factors include family history of CAD (father w/ CAD and CABG in his 12s), HTN and tobacco abuse. Dr. Allyson Sabal ordered a stress test and an echo. He attempted an ETT but pt was unable to reach target HR. Dr. Allyson Sabal recommended a pharmacological stress test. Test was scheduled but pt later canceled and never followed-up. Echo however was done and showed normal LVEF and LVH.   His other PMH includes h/o PE treated w/ Eliquis, COPD, tobacco and alcohol abuse and OSA, on CPAP.   He presented to the [facility] on 12[facility]03[facility]. He was initially seen by his pulmonologist for increased dyspnea and was found to be hypoxic and was referred to the ED for further evaluation.  He was admitted by IM for acute hypoxic respiratory failure, initially felt largely secondary to acute COPDE. Acute CHF also felt to be contributing. BNP on admit was 831. CXR showed borderline cardiac enlargement, but no edema or consolidation. Chest CT was negative for PE. He was admitted by IM and started on steroids and breathing treatments. Lasix started for CHF. 2D echo was obtained and showed normal LVEF and G2DD. Pt was seen by pulmonology yesterday and they felt more CHF was contributing to dyspnea and recommended cardiology consultation.   Pt also endorses a 20 lb weight gain over the last 3-4 weeks. He  notes LEE but also abdominal distention. He reports a baseline dry weight ~278 lb. He was 300 lb on admit. Also recent nocturnal polyuria. He reports full compliance with home diuretics. He takes lasix. Also fully compliant with his antihypotensives and fully compliant w/ CPAP. He also notes some occasional exertional chest discomfort. He does not understand why his BP has been elevated this admission. He checks it regularly at home and SBPs are typically in the 140s. He reports a baseline HR ~55-60 bpm.   Past Medical History:  Diagnosis Date  . Alcohol dependence (HCC) 09/07/2012  . Anemia   . COPD (chronic obstructive pulmonary disease) (HCC)   . Esophagitis 09/07/2012   Per EGD 04/2011  . Gastritis 09/07/2012   Per EGD 04/2011  . Headache    4 - 5 a yr (no migraines)  . Heart murmur   . Hypertension    takes Amlodipine,Labetalol,and Clonidine daily  . Ischemic chest pain (HCC)   . Obesity   . OSA treated with BiPAP   . Osteoarthritis    "knees" (12/03/2018)  . Pulmonary emboli (HCC) 02/2018  . Thrombocytopenia (HCC) 09/07/2012   Hx of in past.  . Tobacco abuse     Past Surgical History:  Procedure Laterality Date  . COLONOSCOPY W/ BIOPSIES AND POLYPECTOMY  2010   "it was negative" (12/03/2018)  . JOINT REPLACEMENT    . KNEE ARTHROSCOPY Left 09/18/2013   Procedure: ARTHROSCOPY KNEE, PARTIAL MEDIAL AND LATERAL MENISCECTOMY, CHONDROPLASTY PATELLA FEMORAL  JOINT;  Surgeon: Harvie Junior, MD;  Location: [facility];  Service: Orthopedics;  Laterality: Left;  . KNEE ARTHROSCOPY Right 06/20/2011  . TONSILLECTOMY     as a child  . TOTAL KNEE ARTHROPLASTY Left 03/19/2014   Procedure: LEFT TOTAL KNEE ARTHROPLASTY;  Surgeon: Harvie Junior, MD;  Location: [facility] OR;  Service: Orthopedics;  Laterality: Left;  . TOTAL KNEE ARTHROPLASTY Right 10/15/2014   Procedure: RIGHT TOTAL KNEE ARTHROPLASTY;  Surgeon: Harvie Junior, MD;  Location: [facility] OR;  Service: Orthopedics;  Laterality: Right;    . UPPER GI ENDOSCOPY       Home Medications:  Prior to Admission medications   Medication Sig Start Date End Date Taking? Authorizing Provider  apixaban (ELIQUIS) 5 MG TABS tablet Take 1 tablet (5 mg total) by mouth 2 (two) times daily. 10/27/18  Yes Young, Joni Fears D, MD  budesonide-formoterol [facility]) 160-4.5 MCG/ACT inhaler Inhale 2 puffs into the lungs 2 (two) times daily. 09/29/18  Yes Young, Joni Fears D, MD  cloNIDine (CATAPRES) 0.2 MG tablet Take 0.2 mg by mouth 2 (two) times daily.    Yes [provider]  furosemide (LASIX) 40 MG tablet Take 40 mg by mouth daily.   Yes [provider]  labetalol (NORMODYNE) 200 MG tablet Take 400 mg by mouth 2 (two) times daily.    Yes [provider]  Melatonin 5 MG TABS Take 5 mg by mouth as needed.   Yes [provider]  Multiple Vitamin (MULITIVITAMIN WITH MINERALS) TABS Take 1 tablet by mouth daily.    Yes [provider]  OVER THE COUNTER MEDICATION CPAP   Yes [provider]  oxyCODONE-acetaminophen (PERCOCET) 10-325 MG tablet Take 1 tablet by mouth every 8 (eight) hours as needed for pain (knees).    Yes [provider]  telmisartan (MICARDIS) 40 MG tablet Take 40 mg by mouth at bedtime.  04/09/18  Yes [provider]  tiZANidine (ZANAFLEX) 4 MG tablet Take 4 mg by mouth as needed. 11/25/18  Yes [provider]    Inpatient Medications: Scheduled Meds: . apixaban  5 mg Oral BID  . cloNIDine  0.2 mg Oral BID  . furosemide  40 mg Intravenous Q12H  . ipratropium-albuterol  3 mL Nebulization TID  . irbesartan  150 mg Oral Daily  . labetalol  400 mg Oral BID  . mometasone-formoterol  2 puff Inhalation BID  . nicotine  14 mg Transdermal Daily  . sodium chloride flush  3 mL Intravenous Q12H   Continuous Infusions:  PRN Meds: albuterol, hydrALAZINE, oxyCODONE-acetaminophen **AND** oxyCODONE, tiZANidine  Allergies:    Allergies  Allergen Reactions  .  Amlodipine Swelling    Pt cannot tolerate 10mg      Social History:   Social History   Socioeconomic History  . Marital status: Divorced    Spouse name: Not on file  . Number of children: Not on file  . Years of education: Not on file  . Highest education level: Not on file  Occupational History  . 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 Financial resource strain: Not on file  . Food insecurity:    Worry: Not on file    Inability: Not on file  . Transportation needs:    Medical: Not on file    Non-medical: Not on file  Tobacco Use  . Smoking status: Current Every Day Smoker  Packs/day: 0.50    Years: 41.00    Pack years: 20.50    Types: Cigarettes  . Smokeless tobacco: Never Used  Substance and Sexual Activity  . Alcohol use: Not Currently    Alcohol/week: 1.0 standard drinks    Types: 1 Shots of liquor per week    Comment: 12/03/2018 h/o 1 Pint of vodka  2x a week ,04-26-18 last time I drank"  . Drug use: Not Currently    Types: Marijuana    Comment: 12/03/2018 "nothing since the early 1980s"  . Sexual activity: Not Currently  Lifestyle  . Physical activity:    Days per week: Not on file    Minutes per session: Not on file  . Stress: Not on file  Relationships  . Social connections:    Talks on phone: Not on file    Gets together: Not on file    Attends religious service: Not on file    Active member of club or organization: Not on file    Attends meetings of clubs or organizations: Not on file    Relationship status: Not on file  . Intimate partner violence:    Fear of current or ex partner: Not on file    Emotionally abused: Not on file    Physically abused: Not on file    Forced sexual activity: Not on file  Other Topics Concern  . Not on file  Social History Narrative  . Not on file    Family History:    Family History  Problem Relation Age of Onset  . CAD Father       ROS:  Please see the history of present illness.   All other ROS reviewed and negative.     Physical Exam/Data:   Vitals:   12/04/18 0617 12/04/18 0900 12/04/18 0920 12/04/18 0927  BP: (!) 215/79 (!) 199/79    Pulse: (!) 51 (!) 57    Resp: 16     Temp: 97.6 F (36.4 C)     TempSrc: Oral     SpO2: 91%  96% 95%  Weight:      Height:        Intake/Output Summary (Last 24 hours) at 12/04/2018 1129 Last data filed at 12/04/2018 0950 Gross per 24 hour  Intake 363 ml  Output 2550 ml  Net -2187 ml   Filed Weights   12/03/18 0721 12/03/18 1749 12/04/18 0423  Weight: (!) 136.6 kg (!) 136.2 kg 134.7 kg   Body mass index is 42.62 kg/m.  General:  Obese middle aged WM in no acute distress HEENT: normal Lymph: no adenopathy Neck: no JVD Endocrine:  No thryomegaly Vascular: No carotid bruits; FA pulses 2+ bilaterally without bruits  Cardiac:  Regular rhythm, bradycardia Lungs:  clear to auscultation bilaterally, no wheezing, rhonchi or rales  Abd: soft, nontender, no hepatomegaly, edema present Ext: 1+ bilateral LEE Musculoskeletal:  No deformities, BUE and BLE strength normal and equal Skin: warm and dry  Neuro:  CNs 2-12 intact, no focal abnormalities noted Psych:  Normal affect   EKG:  The EKG was personally reviewed and demonstrates:  SR, incomplete RBBB, nonspecific lateral Twave abnormalies, RAE Telemetry:  Telemetry was personally reviewed and demonstrates:  Sinus bradycardia upper 40s  Relevant CV Studies: 2D Echo 12/03/18 Study Conclusions  - Left ventricle: The cavity size was normal. Wall thickness was   increased in a pattern of mild LVH. Systolic function was normal.   The estimated ejection fraction was in the range  of 55% to 60%.   Wall motion was normal; there were no regional wall motion   abnormalities. Features are consistent with a pseudonormal left   ventricular filling pattern, with concomitant abnormal relaxation   and increased filling  pressure (grade 2 diastolic dysfunction). - Mitral valve: There was mild regurgitation. - Left atrium: The atrium was severely dilated. - Right ventricle: The cavity size was moderately dilated. Wall   thickness was normal.  Laboratory Data:  Chemistry Recent Labs  Lab 12/03/18 0733  NA 138  K 3.8  CL 103  CO2 21*  GLUCOSE 153*  BUN 22  CREATININE 1.47*  CALCIUM 9.6  GFRNONAA 51*  GFRAA 59*  ANIONGAP 14    No results for input(s): PROT, ALBUMIN, AST, ALT, ALKPHOS, BILITOT in the last 168 hours. Hematology Recent Labs  Lab 12/03/18 0733  WBC 9.6  RBC 5.57  HGB 10.3*  HCT 34.9*  MCV 62.7*  MCH 18.5*  MCHC 29.5*  RDW 15.9*  PLT 240   Cardiac Enzymes Recent Labs  Lab 12/03/18 0733  TROPONINI 0.03*   No results for input(s): TROPIPOC in the last 168 hours.  BNP Recent Labs  Lab 12/03/18 0733  BNP 831.1*    DDimer No results for input(s): DDIMER in the last 168 hours.  Radiology/Studies:  Ct Angio Chest Pe W/cm &/or Wo Cm  Result Date: 12/03/2018 CLINICAL DATA:  Shortness of breath. History of previous pulmonary embolus. EXAM: CT ANGIOGRAPHY CHEST WITH CONTRAST TECHNIQUE: Multidetector CT imaging of the chest was performed using the standard protocol during bolus administration of intravenous contrast. Multiplanar CT image reconstructions and MIPs were obtained to evaluate the vascular anatomy. CONTRAST:  ISOVUE-370 IOPAMIDOL (ISOVUE-370) INJECTION 76% COMPARISON:  Chest CT Apr 27, 2018 and chest CT March 14, 2018; chest radiograph December 03, 2018 FINDINGS: Cardiovascular: There is no demonstrable pulmonary embolus. There is no thoracic aortic aneurysm or dissection. Visualized great vessels appear normal except for mild calcification at the origins of the right innominate and left common carotid arteries. There is aortic atherosclerosis as well as foci of coronary artery calcification. There's no pericardial effusion or pericardial thickening. There is a  degree of left ventricular hypertrophy. Mediastinum/Nodes: Thyroid appears normal. There are multiple prominent mediastinal lymph nodes, slightly increased from a prior study. There is a lymph node to the left of the aortic arch measuring 3.2 x 1.6 cm. A lymph node in the aortopulmonary window region measures 1.7 x 1.4 cm. There is a lymph node anterior to the distal trachea measuring 1.7 x 1.7 cm. A lymph node to the left of the distal trachea near the carina measures 1.6 x 1.3 cm. There is a lymph node in the subcarinal region measuring 1.6 x 1.3 cm. Multiple smaller mediastinal lymph nodes are also evident. No esophageal lesions are evident. Lungs/Pleura: Patchy atelectatic changes noted throughout the lungs bilaterally. The lungs show a somewhat mosaic attenuation at multiple sites, felt to be indicative of underlying small airways obstructive disease. There is no frank consolidation. There is no appreciable pleural effusion or pleural thickening. On axial slice 53 series 6, there is a nodular opacity abutting the pleura in the medial segment of the right middle lobe measuring 7 x 6 mm. This nodular opacity was present previously and appears equivocally larger currently. There is a small calcified granuloma in the anterior segment of the left upper lobe. Upper Abdomen: Visualized upper abdominal structures appear unremarkable except for aortic and major mesenteric arterial atherosclerosis. Musculoskeletal: There is  degenerative change in the thoracic spine with diffuse idiopathic skeletal hyperostosis. There are no blastic or lytic bone lesions. No chest wall lesions evident. Review of the MIP images confirms the above findings. IMPRESSION: 1. No demonstrable pulmonary embolus. No thoracic aortic aneurysm or dissection evident. There is aortic atherosclerosis. There are foci of coronary artery and great vessel/major mesenteric arterial vessel calcification. 2.  Left ventricular hypertrophy. 3. Probable small  airways obstructive disease bilaterally, characterized by mosaic attenuation in the lungs. Areas of patchy atelectasis noted. No frank consolidation. 4. 7 x 6 mm nodular opacity in the medial segment right middle lobe. This nodular opacity is equivocally larger than on prior studies. Non-contrast chest CT at 6-12 months is recommended. If the nodule is stable at time of repeat CT, then future CT at 18-24 months (from today's scan) is considered optional for low-risk patients, but is recommended for high-risk patients. This recommendation follows the consensus statement: Guidelines for Management of Incidental Pulmonary Nodules Detected on CT Images: From the Fleischner Society 2017; Radiology 2017; 284:228-243. 5. Multifocal adenopathy, with increase in lymph node size at several sites compared to prior study. Etiology for this finding uncertain. An underlying neoplastic etiology for this finding must be of concern. Nuclear medicine PET study to assess for abnormal metabolic activity of these lymph nodes may be warranted. 6. Diffuse idiopathic skeletal hyperostosis in portions of the thoracic spine. Aortic Atherosclerosis (ICD10-I70.0). Electronically Signed   By: Bretta Bang III M.D.   On: 12/03/2018 09:26   Dg Chest Portable 1 View  Result Date: 12/03/2018 CLINICAL DATA:  Shortness of breath EXAM: PORTABLE CHEST 1 VIEW COMPARISON:  Chest radiograph and chest CT Apr 27, 2018 FINDINGS: There is borderline cardiomegaly with pulmonary vascularity normal. No edema or consolidation. No evident adenopathy. No evident bone lesions. IMPRESSION: Borderline cardiac enlargement.  No edema or consolidation. Electronically Signed   By: Bretta Bang III M.D.   On: 12/03/2018 08:03    Assessment and Plan:   Jeremy Douglas is a 62 y.o. male with a hx of PE on Eliquis, COPD, tobacco abuse, HTN, OSA on CPAP, obesity and h/o ETOH abuse who is being seen today for the evaluation of acute diastolic CHF, at the  request of Dr. Nelson Chimes, Internal Medicine.   1. Acute Hypoxic Respiratory Failure: 2/2 to acute COPDE and acute Diastolic CHF. See below.  He also has a prior h/o PE.  He was treated w/ Eliquis x 6 months. He had hypercoagulable w/u after Eliquis was stopped and hypercoagulable panel post-PE indicated increased risk of clotting. Eliquis was resumed ~2-3 weeks ago. This is around the time his symptoms started. Chest CT did not show for PE. However ? Missed peripheral PE. I've discussed case with Dr. Mayford Knife. We will check a VQ scan.    2. Acute Diastolic CHF: admit BNP elevated at 831 but no evidence of pulmonary edema on CXR nor chest CT. ? If CHF is the primary cause of his edema. ? Possible recurrent PE. See explanation above in problem #1. We are checking VQ scan. Will obtain repeat BMP now to ensure renal function is stable. If ok, can continue IV Lasix. He endorses a roughly 20 lb weight gain over the last 3-4 weeks, abdominal distention and recent nocturnal polyuria. He reports a dry weight of ~278 lb. He was 300 lb on admit. Echo shows normal LVEF but G2DD and BP has been elevated.  Needs better BP control. See below. Will also check TSH.  3. COPDE: management per primary and pulmonology.   4. HTN: poorly controlled. He is on multiple agents, yet systolic pressures remain elevated in the 190s-200s systolic. We will order renal artery dopplers to r/o RAS. Continue current regimen for now. Bradycardia limits titration of clonidine and labetalol. Would avoid titration of ARB given renal insuffiencey. May need further titration of hydralazine. Continue to monitor.   5. Renal Insuffiencey: Baseline SCr ~1.2. 1.4 yesterday. Will order repeat BMP today.   6. Coronary Artery Calcifications: noted on chest CT. CRFs include HTN, tobacco use, family history, male sex and obesity. He does endorse some occasional exertional CP. Echo shows normal LVEF and no RWMAs. He needs ischemic evaluation. I'm not sure if he  would be an ideal candidate for coronary CTA given his body habitus. May consider pharmacological NST. Will defer to MD.  Will also check FLP in the AM. Goal LDL <70 mg/dL. If not <70, would recommend statin.    7. OSA: fully compliant w/ CPAP.   8. Sinus Bradycardia: HR in the upper 40s on tele. He reports a baseline HR ~55-60 bpm. He is on clonidine + labetalol. He also has OSA but compliant w/ CPAP. Fairly asymptomatic. Will check TSH.   9. H/o PE: he was treated w/ Eliquis x 6 months. He had hypercoagulable w/u after Eliquis was stopped and hypercoagulable panel post-PE indicated increased risk of clotting. Eliquis was resumed ~2-3 weeks ago. Chest CT this admit negative for PE. Cannot r/o possible missed peripheral PE on chest CT. Will obtain V/Q scan to r/o.   10. Abnormal Chest CT: Multifocal adenopathy, with increase in lymph node size at several sites compared to prior study. Etiology for this finding uncertain. An underlying neoplastic etiology for this finding must be of concern. Nuclear medicine PET study to assess for abnormal metabolic activity of these lymph nodes may be warranted. Will defer to IM and pulmonology.    For questions or updates, please contact [facility] Please consult [email or web address] for contact info under     Signed, Robbie LisBrittainy Simmons, PA-C  12/04/2018 11:29 AM

## 2018-12-04 NOTE — Care Management Note (Signed)
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 No needs identified at this time; CM will continue to follow for progression of care.  Expected Discharge Date:     Possibly 12/18/2018             Expected Discharge Plan:  Home/Self Care  Discharge planning Services  CM Consult  Status of Service:  In process, will continue to follow  Reola MosherChandler, Lucca Ballo L, RN,MHA,BSN [phone] 12/04/2018, 10:32 AM

## 2018-12-04 NOTE — Progress Notes (Addendum)
PROGRESS NOTE    Jeremy Douglas  ZOX:[id number] DOB: 1956-09-12 DOA: 12/03/2018 PCP: Darrow Bussing, MD   Brief Narrative:  62 year old with history of PE on Eliquis, obstructive sleep apnea on CPAP, CAD, COPD,, alcohol use, hypertension came to the hospital complains of shortness of breath.  Patient states he has been having off-and-on symptoms of progressive dyspnea on exertion for the past several days and has been evaluated by outpatient pulmonary.  He has tried using Symbicort at home without any relief along with pain medications.  Due to worsening of these complaints he came to the hospital for further evaluation.  During hospitalization his symptoms were thought more clear cardiac reasons rather than pulmonary.  Pulmonary service was consulted.  Cardiology was consulted.   Assessment & Plan:   Principal Problem:   Acute on chronic respiratory failure with hypoxia (HCC) Active Problems:   Essential hypertension   Obstructive sleep apnea   Alcohol dependence (HCC)   Anemia   Morbid obesity (HCC)   Pulmonary embolism (HCC)   Chronic pain   COPD mixed type (HCC)   Tobacco abuse   CKD (chronic kidney disease) stage 3, GFR 30-59 ml/min (HCC)   Elevated brain natriuretic peptide (BNP) level  Acute respiratory distress with hypoxia, slightly improved Acute on chronic diastolic congestive heart failure, grade 2 diastolic dysfunction, class III - Patient still has quite a bit of exertional shortness of breath.  I suspect patient is more fluid overloaded  - Lasix 40 mg IV twice daily.  Monitor urine output -Echocardiogram-55-60%, grade 2 diastolic dysfunction, severely dilated left atrium -Closely monitor electrolytes -Nebulizers as needed, continue Dulera.  Hold off on steroids - Consulted cardiology for their input.  Uncontrolled essential hypertension -Possible consideration is undertreated obstructive sleep apnea.  He is compliant with his CPAP machine but can follow-up  outpatient to ensure the settings are correct for him. -Continue clonidine 0.2 mg twice daily, labetalol 100 mg twice daily, irbesartan 150 mg orally daily -Hydralazine 50 mg every 8 hours added, on Lasix 40 mg every 12 hours  History of pulmonary embolism -Continue Eliquis  Obesity with BMI greater than 40 -Counseled on weight loss, diet and exercise  Chronic pain -Resume his home medications  Obstructive sleep apnea -On CPAP, he admits of being very compliant with this  Anemia -Microcytic.  Will check iron studies  Alcohol use -Appears to be stable.  Tobacco use -Counseled to quit using this.  DVT prophylaxis: On Eliquis Code Status: Full code Family Communication: None at bedside Disposition Plan: Maintain in hospital stay for IV diuresis until his breathing symptoms have improved.  Also needs better blood pressure control  It is my clinical opinion that admission to INPATIENT is reasonable and necessary in this 62 y.o. male . presenting with symptoms of shortness of breath, concerning for acute diastolic CHF . in the context of PMH including: Uncontrolled blood pressure . with pertinent positives on physical exam including: 3+ bilateral lower extremity pitting edema and diminished breath sounds . and pertinent positives on radiographic and laboratory data including: Echocardiogram showing grade 2 diastolic dysfunction . Workup and treatment include IV diuresis and blood pressure control with p.o. and IV medications.   Given the aforementioned, the predictability of an adverse outcome is felt to be significant. I expect that the patient will require at least 2 midnights in the hospital to treat this condition.   Consultants:   Pulmonary  Cardiology  Procedures:   None  Antimicrobials:   None  Subjective: Still has exertional shortness of breath even moving from bed to the bathroom.  Review of Systems Otherwise negative except as per HPI,  including: General: Denies fever, chills, night sweats or unintended weight loss. Resp: Denies cough, wheezing Cardiac: Denies chest pain, palpitations, orthopnea, paroxysmal nocturnal dyspnea. GI: Denies abdominal pain, nausea, vomiting, diarrhea or constipation GU: Denies dysuria, frequency, hesitancy or incontinence MS: Denies muscle aches, joint pain or swelling Neuro: Denies headache, neurologic deficits (focal weakness, numbness, tingling), abnormal gait Psych: Denies anxiety, depression, SI/HI/AVH Skin: Denies new rashes or lesions ID: Denies sick contacts, exotic exposures, travel  Objective: Vitals:   12/04/18 0617 12/04/18 0900 12/04/18 0920 12/04/18 0927  BP: (!) 215/79 (!) 199/79    Pulse: (!) 51 (!) 57    Resp: 16     Temp: 97.6 F (36.4 C)     TempSrc: Oral     SpO2: 91%  96% 95%  Weight:      Height:        Intake/Output Summary (Last 24 hours) at 12/04/2018 1138 Last data filed at 12/04/2018 0950 Gross per 24 hour  Intake 363 ml  Output 2550 ml  Net -2187 ml   Filed Weights   12/03/18 0721 12/03/18 1749 12/04/18 0423  Weight: (!) 136.6 kg (!) 136.2 kg 134.7 kg    Examination:  General exam: Appears calm and comfortable  Respiratory system: Diffuse diminished breath sounds Cardiovascular system: S1 & S2 heard, RRR. No JVD, murmurs, rubs, gallops or clicks.  2-3+ bilateral lower extremity pitting edema Gastrointestinal system: Abdomen is nondistended, soft and nontender. No organomegaly or masses felt. Normal bowel sounds heard. Central nervous system: Alert and oriented. No focal neurological deficits. Extremities: Symmetric 5 x 5 power. Skin: No rashes, lesions or ulcers Psychiatry: Judgement and insight appear normal. Mood & affect appropriate.     Data Reviewed:   CBC: Recent Labs  Lab 12/03/18 0733  WBC 9.6  NEUTROABS 7.4  HGB 10.3*  HCT 34.9*  MCV 62.7*  PLT 240   Basic Metabolic Panel: Recent Labs  Lab 12/03/18 0733  NA 138  K  3.8  CL 103  CO2 21*  GLUCOSE 153*  BUN 22  CREATININE 1.47*  CALCIUM 9.6   GFR: Estimated Creatinine Clearance: 72.9 mL/min (A) (by C-G formula based on SCr of 1.47 mg/dL (H)). Liver Function Tests: No results for input(s): AST, ALT, ALKPHOS, BILITOT, PROT, ALBUMIN in the last 168 hours. No results for input(s): LIPASE, AMYLASE in the last 168 hours. No results for input(s): AMMONIA in the last 168 hours. Coagulation Profile: No results for input(s): INR, PROTIME in the last 168 hours. Cardiac Enzymes: Recent Labs  Lab 12/03/18 0733  TROPONINI 0.03*   BNP (last 3 results) Recent Labs    03/13/18 1130  PROBNP 171.0*   HbA1C: No results for input(s): HGBA1C in the last 72 hours. CBG: No results for input(s): GLUCAP in the last 168 hours. Lipid Profile: No results for input(s): CHOL, HDL, LDLCALC, TRIG, CHOLHDL, LDLDIRECT in the last 72 hours. Thyroid Function Tests: No results for input(s): TSH, T4TOTAL, FREET4, T3FREE, THYROIDAB in the last 72 hours. Anemia Panel: No results for input(s): VITAMINB12, FOLATE, FERRITIN, TIBC, IRON, RETICCTPCT in the last 72 hours. Sepsis Labs: No results for input(s): PROCALCITON, LATICACIDVEN in the last 168 hours.  No results found for this or any previous visit (from the past 240 hour(s)).       Radiology Studies: Ct Angio Chest Pe W/cm &/or Wo Cm  Result Date: 12/03/2018 CLINICAL DATA:  Shortness of breath. History of previous pulmonary embolus. EXAM: CT ANGIOGRAPHY CHEST WITH CONTRAST TECHNIQUE: Multidetector CT imaging of the chest was performed using the standard protocol during bolus administration of intravenous contrast. Multiplanar CT image reconstructions and MIPs were obtained to evaluate the vascular anatomy. CONTRAST:  100mL ISOVUE-370 IOPAMIDOL (ISOVUE-370) INJECTION 76% COMPARISON:  Chest CT Apr 27, 2018 and chest CT March 14, 2018; chest radiograph December 03, 2018 FINDINGS: Cardiovascular: There is no demonstrable  pulmonary embolus. There is no thoracic aortic aneurysm or dissection. Visualized great vessels appear normal except for mild calcification at the origins of the right innominate and left common carotid arteries. There is aortic atherosclerosis as well as foci of coronary artery calcification. There's no pericardial effusion or pericardial thickening. There is a degree of left ventricular hypertrophy. Mediastinum/Nodes: Thyroid appears normal. There are multiple prominent mediastinal lymph nodes, slightly increased from a prior study. There is a lymph node to the left of the aortic arch measuring 3.2 x 1.6 cm. A lymph node in the aortopulmonary window region measures 1.7 x 1.4 cm. There is a lymph node anterior to the distal trachea measuring 1.7 x 1.7 cm. A lymph node to the left of the distal trachea near the carina measures 1.6 x 1.3 cm. There is a lymph node in the subcarinal region measuring 1.6 x 1.3 cm. Multiple smaller mediastinal lymph nodes are also evident. No esophageal lesions are evident. Lungs/Pleura: Patchy atelectatic changes noted throughout the lungs bilaterally. The lungs show a somewhat mosaic attenuation at multiple sites, felt to be indicative of underlying small airways obstructive disease. There is no frank consolidation. There is no appreciable pleural effusion or pleural thickening. On axial slice 53 series 6, there is a nodular opacity abutting the pleura in the medial segment of the right middle lobe measuring 7 x 6 mm. This nodular opacity was present previously and appears equivocally larger currently. There is a small calcified granuloma in the anterior segment of the left upper lobe. Upper Abdomen: Visualized upper abdominal structures appear unremarkable except for aortic and major mesenteric arterial atherosclerosis. Musculoskeletal: There is degenerative change in the thoracic spine with diffuse idiopathic skeletal hyperostosis. There are no blastic or lytic bone lesions. No chest  wall lesions evident. Review of the MIP images confirms the above findings. IMPRESSION: 1. No demonstrable pulmonary embolus. No thoracic aortic aneurysm or dissection evident. There is aortic atherosclerosis. There are foci of coronary artery and great vessel/major mesenteric arterial vessel calcification. 2.  Left ventricular hypertrophy. 3. Probable small airways obstructive disease bilaterally, characterized by mosaic attenuation in the lungs. Areas of patchy atelectasis noted. No frank consolidation. 4. 7 x 6 mm nodular opacity in the medial segment right middle lobe. This nodular opacity is equivocally larger than on prior studies. Non-contrast chest CT at 6-12 months is recommended. If the nodule is stable at time of repeat CT, then future CT at 18-24 months (from today's scan) is considered optional for low-risk patients, but is recommended for high-risk patients. This recommendation follows the consensus statement: Guidelines for Management of Incidental Pulmonary Nodules Detected on CT Images: From the Fleischner Society 2017; Radiology 2017; 284:228-243. 5. Multifocal adenopathy, with increase in lymph node size at several sites compared to prior study. Etiology for this finding uncertain. An underlying neoplastic etiology for this finding must be of concern. Nuclear medicine PET study to assess for abnormal metabolic activity of these lymph nodes may be warranted. 6. Diffuse idiopathic skeletal hyperostosis in  portions of the thoracic spine. Aortic Atherosclerosis (ICD10-I70.0). Electronically Signed   By: Bretta Bang III M.D.   On: 12/03/2018 09:26   Dg Chest Portable 1 View  Result Date: 12/03/2018 CLINICAL DATA:  Shortness of breath EXAM: PORTABLE CHEST 1 VIEW COMPARISON:  Chest radiograph and chest CT Apr 27, 2018 FINDINGS: There is borderline cardiomegaly with pulmonary vascularity normal. No edema or consolidation. No evident adenopathy. No evident bone lesions. IMPRESSION: Borderline  cardiac enlargement.  No edema or consolidation. Electronically Signed   By: Bretta Bang III M.D.   On: 12/03/2018 08:03        Scheduled Meds: . apixaban  5 mg Oral BID  . cloNIDine  0.2 mg Oral BID  . furosemide  40 mg Intravenous Q12H  . hydrALAZINE  50 mg Oral Q8H  . ipratropium-albuterol  3 mL Nebulization TID  . irbesartan  150 mg Oral Daily  . labetalol  400 mg Oral BID  . mometasone-formoterol  2 puff Inhalation BID  . nicotine  14 mg Transdermal Daily  . sodium chloride flush  3 mL Intravenous Q12H   Continuous Infusions:   LOS: 0 days   Time spent= 40 mins    Dago Jungwirth Joline Maxcy, MD [facility] Pager [phone]   If 7PM-7AM, please contact night-coverage [email or web address] Password [facility] 12/04/2018, 11:38 AM

## 2018-12-04 NOTE — Evaluation (Signed)
Physical Therapy Evaluation & Discharge Patient Details Name: Jeremy Douglas MRN: [id number] DOB: 11-14-1956 Today's Date: 12/04/2018   History of Present Illness  Pt is a 62 y.o. male admitted 12/03/18 with COPD exacerbation. PMH includes PE on Eliquis, OSA on CPAP, CAD, HTN, COPD (not on home O2), ETOH use, TKA.    Clinical Impression  Patient evaluated by Physical Therapy with no further acute PT needs identified. PTA, pt indep and lives with daughter. Today, pt indep with mobility. SpO2 92-94% with ambulation. Energy conservation education and handout provided. All education has been completed and the patient has no further questions. PT is signing off. Thank you for this referral.    Follow Up Recommendations No PT follow up    Equipment Recommendations  None recommended by PT    Recommendations for Other Services       Precautions / Restrictions Precautions Precautions: None Restrictions Weight Bearing Restrictions: No      Mobility  Bed Mobility Overal bed mobility: Independent                Transfers Overall transfer level: Independent                  Ambulation/Gait Ambulation/Gait assistance: Independent Gait Distance (Feet): 180 Feet Assistive device: None Gait Pattern/deviations: Step-through pattern;Decreased stride length   Gait velocity interpretation: 1.31 - 2.62 ft/sec, indicative of limited community ambulator General Gait Details: SpO2 92-94% on RA  Stairs            Wheelchair Mobility    Modified Rankin (Stroke Patients Only)       Balance Overall balance assessment: No apparent balance deficits (not formally assessed)                                           Pertinent Vitals/Pain Pain Assessment: No/denies pain    Home Living Family/patient expects to be discharged to:: Private residence Living Arrangements: Children;Other relatives Available Help at Discharge: Family;Available 24  hours/day Type of Home: House Home Access: Level entry     Home Layout: Multi-level;Able to live on main level with bedroom/bathroom Home Equipment: Dan Humphreys - 2 wheels      Prior Function Level of Independence: Independent               Hand Dominance        Extremity/Trunk Assessment   Upper Extremity Assessment Upper Extremity Assessment: Overall WFL for tasks assessed    Lower Extremity Assessment Lower Extremity Assessment: Overall WFL for tasks assessed       Communication   Communication: No difficulties  Cognition Arousal/Alertness: Awake/alert Behavior During Therapy: WFL for tasks assessed/performed Overall Cognitive Status: Within Functional Limits for tasks assessed                                        General Comments      Exercises     Assessment/Plan    PT Assessment Patent does not need any further PT services  PT Problem List         PT Treatment Interventions      PT Goals (Current goals can be found in the Care Plan section)  Acute Rehab PT Goals PT Goal Formulation: All assessment and education complete, DC therapy    Frequency  Barriers to discharge        Co-evaluation               AM-PAC PT "6 Clicks" Mobility  Outcome Measure Help needed turning from your back to your side while in a flat bed without using bedrails?: None Help needed moving from lying on your back to sitting on the side of a flat bed without using bedrails?: None Help needed moving to and from a bed to a chair (including a wheelchair)?: None Help needed standing up from a chair using your arms (e.g., wheelchair or bedside chair)?: None Help needed to walk in hospital room?: None Help needed climbing 3-5 steps with a railing? : None 6 Click Score: 24    End of Session   Activity Tolerance: Patient tolerated treatment well Patient left: in bed;with call bell/phone within reach Nurse Communication: Mobility status PT  Visit Diagnosis: Other abnormalities of gait and mobility (R26.89)    Time: [id number] PT Time Calculation (min) (ACUTE ONLY): 10 min   Charges:   PT Evaluation $PT Eval Low Complexity: 1 Low         Ina HomesJaclyn Necha Harries, PT, DPT Acute Rehabilitation Services  Pager [phone] Office [phone]  Malachy ChamberJaclyn L Reona Zendejas 12/04/2018, 11:46 AM

## 2018-12-04 NOTE — Progress Notes (Signed)
Nutrition Brief Note  RD consulted for assessment of nutrition status/ needs per COPD GOLD protocol.   Wt Readings from Last 15 Encounters:  12/04/18 134.7 kg  12/02/18 (!) 136.6 kg  09/25/18 133.1 kg  06/16/18 126.7 kg  04/30/18 122.1 kg  03/14/18 124.7 kg  03/13/18 128.8 kg  02/16/16 135.1 kg  02/14/16 133.4 kg  01/06/15 132.1 kg  10/15/14 127.9 kg  10/12/14 128.1 kg  03/19/14 122.9 kg  03/16/14 123 kg  01/05/14 122 kg   Jeremy Douglas is a 62 y.o. male with medical history significant of PE on Eliquis; OSA on CPAP; CAD; HTN; COPD not on home O2; and ETOH dependence presenting with SOB.    Pt admitted with COPD exacerbation.   Pt working with physical therapy at time of visit. He is ambulating hallways without difficulty. Pt with good appetite; meal completion 100%.   Nutrition-Focused physical exam completed. Findings are no fat depletion, no muscle depletion, and mild edema.   Body mass index is 42.62 kg/m. Patient meets criteria for extreme obesity, class III based on current BMI.   Current diet order is Heart Healthy, patient is consuming approximately 100% of meals at this time. Labs and medications reviewed.   No nutrition interventions warranted at this time. If nutrition issues arise, please consult RD.   Jeremy Douglas, RD, LDN, CDE Pager: [phone] After hours Pager: [phone]

## 2018-12-05 ENCOUNTER — Other Ambulatory Visit: Payer: Self-pay | Admitting: Cardiology

## 2018-12-05 ENCOUNTER — Inpatient Hospital Stay (HOSPITAL_COMMUNITY): Payer: BLUE CROSS/BLUE SHIELD

## 2018-12-05 DIAGNOSIS — I5033 Acute on chronic diastolic (congestive) heart failure: Secondary | ICD-10-CM

## 2018-12-05 DIAGNOSIS — R0602 Shortness of breath: Secondary | ICD-10-CM

## 2018-12-05 DIAGNOSIS — I10 Essential (primary) hypertension: Secondary | ICD-10-CM

## 2018-12-05 LAB — CBC
HCT: 35.9 % — ABNORMAL LOW (ref 39.0–52.0)
Hemoglobin: 10.5 g/dL — ABNORMAL LOW (ref 13.0–17.0)
MCH: 18.3 pg — ABNORMAL LOW (ref 26.0–34.0)
MCHC: 29.2 g/dL — ABNORMAL LOW (ref 30.0–36.0)
MCV: 62.5 fL — ABNORMAL LOW (ref 80.0–100.0)
Platelets: 244 10*3/uL (ref 150–400)
RBC: 5.74 MIL/uL (ref 4.22–5.81)
RDW: 15.9 % — ABNORMAL HIGH (ref 11.5–15.5)
WBC: 9.1 10*3/uL (ref 4.0–10.5)
nRBC: 0 % (ref 0.0–0.2)

## 2018-12-05 LAB — FOLATE RBC
Folate, Hemolysate: 428.6 ng/mL
Folate, RBC: 1279 ng/mL (ref >498)
Hematocrit: 33.5 % — ABNORMAL LOW (ref 37.5–51.0)

## 2018-12-05 LAB — BASIC METABOLIC PANEL
Anion gap: 12 (ref 5–15)
BUN: 29 mg/dL — ABNORMAL HIGH (ref 8–23)
CO2: 25 mmol/L (ref 22–32)
Calcium: 10 mg/dL (ref 8.9–10.3)
Chloride: 100 mmol/L (ref 98–111)
Creatinine, Ser: 1.55 mg/dL — ABNORMAL HIGH (ref 0.61–1.24)
GFR calc Af Amer: 55 mL/min — ABNORMAL LOW (ref >60)
GFR calc non Af Amer: 48 mL/min — ABNORMAL LOW (ref >60)
Glucose, Bld: 124 mg/dL — ABNORMAL HIGH (ref 70–99)
POTASSIUM: 3.6 mmol/L (ref 3.5–5.1)
Sodium: 137 mmol/L (ref 135–145)

## 2018-12-05 LAB — MAGNESIUM: Magnesium: 2.1 mg/dL (ref 1.7–2.4)

## 2018-12-05 LAB — LIPID PANEL
CHOLESTEROL: 184 mg/dL (ref 0–200)
HDL: 25 mg/dL — ABNORMAL LOW (ref >40)
LDL Cholesterol: 128 mg/dL — ABNORMAL HIGH (ref 0–99)
Total CHOL/HDL Ratio: 7.4 RATIO
Triglycerides: 156 mg/dL — ABNORMAL HIGH (ref <150)
VLDL: 31 mg/dL (ref 0–40)

## 2018-12-05 MED ORDER — POLYETHYLENE GLYCOL 3350 17 G PO PACK
17.0000 g | PACK | Freq: Every day | ORAL | Status: DC | PRN
  Filled 2018-12-05: qty 1

## 2018-12-05 MED ORDER — MAGNESIUM OXIDE 400 (241.3 MG) MG PO TABS
800.0000 mg | ORAL_TABLET | Freq: Once | ORAL | Status: AC
  Administered 2018-12-05: 800 mg via ORAL
  Filled 2018-12-05: qty 2

## 2018-12-05 MED ORDER — POTASSIUM CHLORIDE CRYS ER 20 MEQ PO TBCR
40.0000 meq | EXTENDED_RELEASE_TABLET | Freq: Once | ORAL | Status: AC
  Administered 2018-12-05: 40 meq via ORAL
  Filled 2018-12-05: qty 2

## 2018-12-05 MED ORDER — ATORVASTATIN CALCIUM 10 MG PO TABS
20.0000 mg | ORAL_TABLET | Freq: Every day | ORAL | Status: DC
  Administered 2018-12-05: 20 mg via ORAL
  Filled 2018-12-05: qty 2

## 2018-12-05 MED ORDER — LABETALOL HCL 200 MG PO TABS
200.0000 mg | ORAL_TABLET | Freq: Two times a day (BID) | ORAL | Status: DC
  Filled 2018-12-05: qty 1

## 2018-12-05 MED ORDER — HYDRALAZINE HCL 50 MG PO TABS
75.0000 mg | ORAL_TABLET | Freq: Three times a day (TID) | ORAL | Status: DC
  Administered 2018-12-05 – 2018-12-06 (×3): 75 mg via ORAL
  Filled 2018-12-05 (×3): qty 1

## 2018-12-05 MED ORDER — FERROUS SULFATE 325 (65 FE) MG PO TABS
325.0000 mg | ORAL_TABLET | Freq: Every day | ORAL | Status: DC
  Administered 2018-12-06: 325 mg via ORAL
  Filled 2018-12-05: qty 1

## 2018-12-05 NOTE — Progress Notes (Signed)
Discussed coronary CTA with Dr. Delton SeeNelson who would be reading study.  She is concerned about his CKD with bump in creatinine after chest CT.  Would prefer to not do coronary CTA and wait until next week and get D-Spect lexiscan myoview at [address] next week.  Well will set up stress test in our office for next week.  Would still keep overnight to make sure HR and BP stable with med changes today.

## 2018-12-05 NOTE — Progress Notes (Signed)
Renal artery duplex       has been completed. Preliminary results can be found under CV proc through chart review. Farrel DemarkJill Eunice, RDMS, RVT    Limited study due to body habitus.

## 2018-12-05 NOTE — Progress Notes (Addendum)
Progress Note  Patient Name: Jeremy Douglas Date of Encounter: 12/05/2018  Primary Cardiologist: Nanetta Batty, MD   Subjective   Feels better.  Denies any SOB or CP.  VQ scan yesterday with low prob for PE  Inpatient Medications    Scheduled Meds: . apixaban  5 mg Oral BID  . cloNIDine  0.2 mg Oral BID  . [START ON 12/06/2018] ferrous sulfate  325 mg Oral Q breakfast  . hydrALAZINE  50 mg Oral Q8H  . irbesartan  150 mg Oral Daily  . labetalol  400 mg Oral BID  . magnesium oxide  800 mg Oral Once  . mometasone-formoterol  2 puff Inhalation BID  . nicotine  14 mg Transdermal Daily  . potassium chloride  40 mEq Oral Once  . sodium chloride flush  3 mL Intravenous Q12H   Continuous Infusions:  PRN Meds: hydrALAZINE, ipratropium-albuterol, oxyCODONE-acetaminophen **AND** oxyCODONE, polyethylene glycol, tiZANidine   Vital Signs    Vitals:   12/04/18 0920 12/04/18 0927 12/04/18 2036 12/05/18 0456  BP:   (!) 193/73 (!) 158/60  Pulse:   (!) 54 (!) 49  Resp:   18 18  Temp:   (!) 97.4 F (36.3 C) (!) 97.5 F (36.4 C)  TempSrc:   Oral Oral  SpO2: 96% 95% 93% 92%  Weight:    134.7 kg  Height:        Intake/Output Summary (Last 24 hours) at 12/05/2018 1100 Last data filed at 12/05/2018 5784 Gross per 24 hour  Intake 600 ml  Output 2675 ml  Net -2075 ml   Filed Weights   12/03/18 1749 12/04/18 0423 12/05/18 0456  Weight: (!) 136.2 kg 134.7 kg 134.7 kg    Telemetry    Sinus bradycardia at 38bbpm - Personally Reviewed  ECG    No new EKG to review - Personally Reviewed  Physical Exam   GEN: No acute distress.   Neck: No JVD Cardiac: RRR, no murmurs, rubs, or gallops.  Respiratory: Clear to auscultation bilaterally. GI: Soft, nontender, non-distended  MS: trace edema; No deformity. Neuro:  Nonfocal  Psych: Normal affect   Labs    Chemistry Recent Labs  Lab 12/03/18 0733 12/04/18 1244 12/05/18 0533  NA 138 139 137  K 3.8 3.9 3.6  CL 103 104  100  CO2 21* 21* 25  GLUCOSE 153* 151* 124*  BUN 22 24* 29*  CREATININE 1.47* 1.62* 1.55*  CALCIUM 9.6 9.6 10.0  GFRNONAA 51* 45* 48*  GFRAA 59* 52* 55*  ANIONGAP 14 14 12      Hematology Recent Labs  Lab 12/03/18 0733 12/05/18 0533  WBC 9.6 9.1  RBC 5.57 5.74  HGB 10.3* 10.5*  HCT 34.9* 35.9*  MCV 62.7* 62.5*  MCH 18.5* 18.3*  MCHC 29.5* 29.2*  RDW 15.9* 15.9*  PLT 240 244    Cardiac Enzymes Recent Labs  Lab 12/03/18 0733  TROPONINI 0.03*   No results for input(s): TROPIPOC in the last 168 hours.   BNP Recent Labs  Lab 12/03/18 0733  BNP 831.1*     DDimer No results for input(s): DDIMER in the last 168 hours.   Radiology    Dg Chest 2 View  Result Date: 12/04/2018 CLINICAL DATA:  Shortness of breath. EXAM: CHEST - 2 VIEW COMPARISON:  Single-view of the chest and CT chest 12/03/2018. PA and lateral chest 04/20/2014. FINDINGS: Bilateral airspace disease has an appearance most consistent with pulmonary edema. No pneumothorax or pleural effusion. Heart size is mildly  enlarged. No acute or focal bony abnormality. IMPRESSION: Bilateral airspace disease most consistent with pulmonary edema. Cardiomegaly. Electronically Signed   By: Drusilla Kanner M.D.   On: 12/04/2018 15:16   Nm Pulmonary Perf And Vent  Result Date: 12/04/2018 CLINICAL DATA:  Shortness of Breath EXAM: NUCLEAR MEDICINE VENTILATION - PERFUSION LUNG SCAN VIEWS: Anterior, posterior, left lateral, right lateral, RPO, LPO, RAO, LAO-ventilation and perfusion RADIOPHARMACEUTICALS:  30.0 mCi of Tc-79m DTPA aerosol inhalation and 4.0 mCi Tc37m MAA IV COMPARISON:  Chest radiograph December 04, 2018 FINDINGS: Ventilation: Radiotracer uptake on the ventilation study is homogeneous and symmetric bilaterally. No ventilation defects evident. Perfusion: Radiotracer uptake on the perfusion study is homogeneous and symmetric bilaterally. No perfusion defects evident. IMPRESSION: No appreciable ventilation or perfusion  defects. Very low probability of pulmonary embolus. Electronically Signed   By: Bretta Bang III M.D.   On: 12/04/2018 15:27   Vas US Renal Artery Duplex  Result Date: 12/05/2018 ABDOMINAL VISCERAL High Risk Factors: Hypertension. Limitations: Air/bowel gas and obesity.  Examination Guidelines: A complete evaluation includes B-mode imaging, spectral Doppler, color Doppler, and power Doppler as needed of all accessible portions of each vessel. Bilateral testing is considered an integral part of a complete examination. Limited examinations for reoccurring indications may be performed as noted.  Duplex Findings:  Mesenteric Technologist observations: unable to visualize due to overlying bowel gas.  +------------------+--------+--------+-------+ Right Renal ArteryPSV cm/sEDV cm/sComment +------------------+--------+--------+-------+ Origin              146      31           +------------------+--------+--------+-------+ Proximal            191      25           +------------------+--------+--------+-------+ Mid                  74      15           +------------------+--------+--------+-------+ Distal              117      21           +------------------+--------+--------+-------+ +-----------------+--------+--------+--------------+ Left Renal ArteryPSV cm/sEDV cm/s   Comment     +-----------------+--------+--------+--------------+ Origin                           not visualized +-----------------+--------+--------+--------------+ Proximal            63      13                  +-----------------+--------+--------+--------------+ Mid                118      11                  +-----------------+--------+--------+--------------+ Distal             102      18                  +-----------------+--------+--------+--------------+ Technologist observations Renal Artery(s):Left kidney mid pole hypoechoic area measuring 1.72 cm, possibly cystic structure.  +------------+--------+--------+----+-----------+--------+--------+----+ Right KidneyPSV cm/sEDV cm/sRI  Left KidneyPSV cm/sEDV cm/sRI   +------------+--------+--------+----+-----------+--------+--------+----+ Upper Pole  30      7       0.75Upper Pole 40      10      0.74 +------------+--------+--------+----+-----------+--------+--------+----+ Mid  54      10      0.82Mid        40      6       0.86 +------------+--------+--------+----+-----------+--------+--------+----+ Lower Pole  39      6       0.84Lower Pole 42      10      0.76 +------------+--------+--------+----+-----------+--------+--------+----+ Hilar       122     19      0.84Hilar      101     27      0.73 +------------+--------+--------+----+-----------+--------+--------+----+ +------------------+-----+------------------+-----+ Right Kidney           Left Kidney             +------------------+-----+------------------+-----+ RAR                    RAR                     +------------------+-----+------------------+-----+ RAR (manual)           RAR (manual)            +------------------+-----+------------------+-----+ Cortex                 Cortex                  +------------------+-----+------------------+-----+ Cortex thickness       Corex thickness         +------------------+-----+------------------+-----+ Kidney length (cm)14.69Kidney length (cm)13.47 +------------------+-----+------------------+-----+   Summary: Renal:  Right: 1-59% stenosis of the right renal artery. Abnormal right        Resistive Index. Left:  No evidence of left renal artery stenosis. Abnormal left        Resisitve Index.  *See table(s) above for measurements and observations.     Preliminary     Cardiac Studies  2D echo 12/03/2018 Study Conclusions  - Left ventricle: The cavity size was normal. Wall thickness was   increased in a pattern of mild LVH. Systolic function was normal.    The estimated ejection fraction was in the range of 55% to 60%.   Wall motion was normal; there were no regional wall motion   abnormalities. Features are consistent with a pseudonormal left   ventricular filling pattern, with concomitant abnormal relaxation   and increased filling pressure (grade 2 diastolic dysfunction). - Mitral valve: There was mild regurgitation. - Left atrium: The atrium was severely dilated. - Right ventricle: The cavity size was moderately dilated. Wall   thickness was normal.   Patient Profile     62 y.o. male with a hx of PE on Eliquis, COPD, tobacco abuse, HTN, OSA on CPAP, obesity and h/o ETOH abuse who is being seen for the evaluation of acute diastolic CHF, at the request of Dr. Nelson ChimesAmin, Internal Medicine.   Assessment & Plan    1. Acute Hypoxic Respiratory Failure: 2/2 to acute COPDE and acute Diastolic CHF.  He also has a prior h/o PE.  He was treated w/ Eliquis x 6 months. He had hypercoagulable w/u after Eliquis was stopped and hypercoagulable panel post-PE indicated increased risk of clotting. Eliquis was resumed ~2-3 weeks ago. This is around the time his symptoms started. -markedly hypoxic on admission despite clear lungs on Cxray and Chest CT -chest CT and VQ scan negative for PE  -BNP elevated in the 800's -breathing improved with IV Lasix  2. Acute Diastolic CHF: admit BNP elevated at 831  but no evidence of pulmonary edema on CXR nor chest CT.  -He endorses a roughly 20 lb weight gain over the last 3-4 weeks, abdominal distention and recent nocturnal polyuria.  -He reports a dry weight of ~278 lb. He was 300 lb on admit.  -Given IV Lasix yesterday and put out 1.8L and is net neg 4.2L since admit -weight down 3lbs from admit (297lb) -Echo shows normal LVEF but G2DD  -Needs better BP control.  -TSH normal -creatinine improved to 1.55 today (baseline around 1.2) -hold further diuretics for now since getting CT scan today -he has mild LVF on echo as  well as CKD.  Consider  PYP scan to rule out amyloidosis.  Can be done outpt.  3. COPDE: management per primary and pulmonology.   4. HTN -poorly controlled.  -He is on multiple agents, yet systolic pressures remain elevated in the 190s-200s systolic.  -renal artery dopplers showed 1-59% right RAS -Continue current regimen for now.  -Bradycardia limits titration of clonidine and labetalol.  -Would avoid titration of ARB given renal insuffiencey.  -HR in the upper 30's today so will decrease Labetolol to 200mg  BID -Increase Hydralazine to 75mg  TID -apparently has a allergy to amlodipine  5. Renal Insuffiencey:  -Baseline SCr ~1.2. -creatinine down from 1.62 to 1.55 -continue to follow  6. Coronary Artery Calcifications: noted on chest CT.  -CRFs include HTN, tobacco use, family history, male sex and obesity.  -He does endorse some occasional exertional CP. - Echo shows normal LVEF and no RWMAs.  -He needs ischemic evaluation.  -cannot do nuclear stress test for 48 hours due to VQ scan yesterday -will get coronary CTA today to assess coronary anatomy - discussed with TRH and radiology and creatinine ok for CT  7.  Hyperlipidemia -LDL 128 with goal < 70 due to coronary calcification -start Lipitor 20mg  daily -check FLP and ALT in 6 weeks  8. OSA: fully compliant w/ CPAP.   9. Sinus Bradycardia:  -HR in the upper 30s on tele. -He reports a baseline HR ~55-60 bpm but has been lower after labetolol increased -He also has OSA but compliant w/ CPAP.  -TSH normal -decrease Labetolol to 200mg  BID  9. H/o PE: he was treated w/ Eliquis x 6 months. He had hypercoagulable w/u after Eliquis was stopped and hypercoagulable panel post-PE indicated increased risk of clotting. Eliquis was resumed ~2-3 weeks ago. Chest CT and VQ scan this admit negative for PE.   10. Abnormal Chest CT: Multifocal adenopathy, with increase in lymph node size at several sites compared to prior study.  Etiology for this finding uncertain. An underlying neoplastic etiology for this finding must be of concern. Nuclear medicine PET study to assess for abnormal metabolic activity of these lymph nodes may be warranted. Will defer to IM and pulmonology.     I have spent a total of 35 minutes with patient reviewing VQ scan, echo , telemetry, EKGs, labs and examining patient as well as establishing an assessment and plan that was discussed with the patient.  > 50% of time was spent in direct patient care.    For questions or updates, please contact [facility] Please consult [email or web address] for contact info under Cardiology/STEMI.      Signed, Armanda Magic, MD  12/05/2018, 11:00 AM

## 2018-12-05 NOTE — Progress Notes (Addendum)
PROGRESS NOTE    Jeremy Douglas  ZOX:[id number] DOB: [birth year] DOA: 12/03/2018 PCP: Darrow Bussing, MD   Brief Narrative:  62 year old with history of PE on Eliquis, obstructive sleep apnea on CPAP, CAD, COPD,, alcohol use, hypertension came to the hospital complains of shortness of breath.  Patient states he has been having off-and-on symptoms of progressive dyspnea on exertion for the past several days and has been evaluated by outpatient pulmonary.  He has tried using Symbicort at home without any relief along with pain medications.  Due to worsening of these complaints he came to the hospital for further evaluation.  During hospitalization his symptoms were thought more clear cardiac reasons rather than pulmonary.  Pulmonary service was consulted.  Cardiology was consulted.  VQ scan was performed which was negative for pulmonary embolism.   Assessment & Plan:   Principal Problem:   Acute on chronic respiratory failure with hypoxia (HCC) Active Problems:   Essential hypertension   Obstructive sleep apnea   Alcohol dependence (HCC)   Anemia   Morbid obesity (HCC)   Pulmonary embolism (HCC)   Chronic pain   COPD mixed type (HCC)   Tobacco abuse   CKD (chronic kidney disease) stage 3, GFR 30-59 ml/min (HCC)   Elevated brain natriuretic peptide (BNP) level  Acute respiratory distress with hypoxia, slightly improved Acute on chronic diastolic congestive heart failure, grade 2 diastolic dysfunction, class III - Patient's breathing has improved with IV diuresis. -Echocardiogram shows ejection fraction 50 to 55%, grade 2 diastolic dysfunction - VQ scan-low suspicion for PE. -Should be able to transition him to oral diuretics. -Defer the need for stress test to cardiology, currently he is n.p.o. -Appreciate cardiology input -Bronchodilators as needed.  Spoke with Dr Mayford Knife, appreciate her input- Plan will be to get CTA Coronary today and make further determination from there on.    Uncontrolled essential hypertension slowly improving -Continue CPAP machine. -Continue clonidine 0.2 mg twice daily, labetalol 100 mg twice daily, irbesartan 150 mg orally daily -Added hydralazine 50 mg every 8 hours. -Renal ultrasound-right side 1-59% stenosis, no evidence of stenosis on the left  History of pulmonary embolism -Continue Eliquis  Obesity with BMI greater than 40 -Counseled on weight loss, diet and exercise  Chronic pain -Resume his home medications  Obstructive sleep apnea -On CPAP, he admits of being very compliant with this  Anemia, microcytic anemia likely from iron deficiency -Borderline low ferritin and low saturation ratio.  Will start him on iron supplements.  Multifocal adenopathy - Follow-up outpatient with pulmonary.  Alcohol use -Appears to be stable.  Tobacco use -Counseled to quit using this.  DVT prophylaxis: On Eliquis Code Status: Full code Family Communication: None at bedside Disposition Plan: Plans for possible stress test.  Will defer this to cardiology.  Consultants:   Pulmonary  Cardiology  Procedures:   None  Antimicrobials:   None   Subjective: Patient reports his lower extremity swelling is slightly improved and his dyspnea is improving as well.  Review of Systems Otherwise negative except as per HPI, including: General = no fevers, chills, dizziness, malaise, fatigue HEENT/EYES = negative for pain, redness, loss of vision, double vision, blurred vision, loss of hearing, sore throat, hoarseness, dysphagia Cardiovascular= negative for chest pain, palpitation, murmurs, lower extremity swelling Respiratory/lungs= negative for shortness of breath, cough, hemoptysis, wheezing, mucus production Gastrointestinal= negative for nausea, vomiting,, abdominal pain, melena, hematemesis Genitourinary= negative for Dysuria, Hematuria, Change in Urinary Frequency MSK = Negative for arthralgia, myalgias, Back Pain, Joint  swelling  Neurology= Negative for headache, seizures, numbness, tingling  Psychiatry= Negative for anxiety, depression, suicidal and homocidal ideation Allergy/Immunology= Medication/Food allergy as listed  Skin= Negative for Rash, lesions, ulcers, itching   Objective: Vitals:   12/04/18 0920 12/04/18 0927 12/04/18 2036 12/05/18 0456  BP:   (!) 193/73 (!) 158/60  Pulse:   (!) 54 (!) 49  Resp:   18 18  Temp:   (!) 97.4 F (36.3 C) (!) 97.5 F (36.4 C)  TempSrc:   Oral Oral  SpO2: 96% 95% 93% 92%  Weight:    134.7 kg  Height:        Intake/Output Summary (Last 24 hours) at 12/05/2018 1042 Last data filed at 12/05/2018 6962 Gross per 24 hour  Intake 600 ml  Output 2675 ml  Net -2075 ml   Filed Weights   12/03/18 1749 12/04/18 0423 12/05/18 0456  Weight: (!) 136.2 kg 134.7 kg 134.7 kg    Examination:  Constitutional: NAD, calm, comfortable Eyes: PERRL, lids and conjunctivae normal ENMT: Mucous membranes are moist. Posterior pharynx clear of any exudate or lesions.Normal dentition.  Neck: normal, supple, no masses, no thyromegaly Respiratory: clear to auscultation bilaterally, no wheezing, no crackles. Normal respiratory effort. No accessory muscle use.  Cardiovascular: Regular rate and rhythm, no murmurs / rubs / gallops. 2+ extremity edema. 2+ pedal pulses. No carotid bruits.  Abdomen: no tenderness, no masses palpated. No hepatosplenomegaly. Bowel sounds positive.  Musculoskeletal: no clubbing / cyanosis. No joint deformity upper and lower extremities. Good ROM, no contractures. Normal muscle tone.  Skin: no rashes, lesions, ulcers. No induration Neurologic: CN 2-12 grossly intact. Sensation intact, DTR normal. Strength 5/5 in all 4.  Psychiatric: Normal judgment and insight. Alert and oriented x 3. Normal mood.    Data Reviewed:   CBC: Recent Labs  Lab 12/03/18 0733 12/05/18 0533  WBC 9.6 9.1  NEUTROABS 7.4  --   HGB 10.3* 10.5*  HCT 34.9* 35.9*  MCV 62.7*  62.5*  PLT 240 244   Basic Metabolic Panel: Recent Labs  Lab 12/03/18 0733 12/04/18 1244 12/05/18 0533  NA 138 139 137  K 3.8 3.9 3.6  CL 103 104 100  CO2 21* 21* 25  GLUCOSE 153* 151* 124*  BUN 22 24* 29*  CREATININE 1.47* 1.62* 1.55*  CALCIUM 9.6 9.6 10.0  MG  --   --  2.1   GFR: Estimated Creatinine Clearance: 69.2 mL/min (A) (by C-G formula based on SCr of 1.55 mg/dL (H)). Liver Function Tests: No results for input(s): AST, ALT, ALKPHOS, BILITOT, PROT, ALBUMIN in the last 168 hours. No results for input(s): LIPASE, AMYLASE in the last 168 hours. No results for input(s): AMMONIA in the last 168 hours. Coagulation Profile: No results for input(s): INR, PROTIME in the last 168 hours. Cardiac Enzymes: Recent Labs  Lab 12/03/18 0733  TROPONINI 0.03*   BNP (last 3 results) Recent Labs    03/13/18 1130  PROBNP 171.0*   HbA1C: No results for input(s): HGBA1C in the last 72 hours. CBG: No results for input(s): GLUCAP in the last 168 hours. Lipid Profile: Recent Labs    12/05/18 0533  CHOL 184  HDL 25*  LDLCALC 128*  TRIG 156*  CHOLHDL 7.4   Thyroid Function Tests: Recent Labs    12/04/18 1244  TSH 4.941*   Anemia Panel: Recent Labs    12/04/18 1244  VITAMINB12 423  FERRITIN 174  TIBC 364  IRON 51   Sepsis Labs: No results for  input(s): PROCALCITON, LATICACIDVEN in the last 168 hours.  No results found for this or any previous visit (from the past 240 hour(s)).       Radiology Studies: Dg Chest 2 View  Result Date: 12/04/2018 CLINICAL DATA:  Shortness of breath. EXAM: CHEST - 2 VIEW COMPARISON:  Single-view of the chest and CT chest 12/03/2018. PA and lateral chest 04/20/2014. FINDINGS: Bilateral airspace disease has an appearance most consistent with pulmonary edema. No pneumothorax or pleural effusion. Heart size is mildly enlarged. No acute or focal bony abnormality. IMPRESSION: Bilateral airspace disease most consistent with pulmonary  edema. Cardiomegaly. Electronically Signed   By: Drusilla Kanner M.D.   On: 12/04/2018 15:16   Nm Pulmonary Perf And Vent  Result Date: 12/04/2018 CLINICAL DATA:  Shortness of Breath EXAM: NUCLEAR MEDICINE VENTILATION - PERFUSION LUNG SCAN VIEWS: Anterior, posterior, left lateral, right lateral, RPO, LPO, RAO, LAO-ventilation and perfusion RADIOPHARMACEUTICALS:  30.0 mCi of Tc-28m DTPA aerosol inhalation and 4.0 mCi Tc62m MAA IV COMPARISON:  Chest radiograph December 04, 2018 FINDINGS: Ventilation: Radiotracer uptake on the ventilation study is homogeneous and symmetric bilaterally. No ventilation defects evident. Perfusion: Radiotracer uptake on the perfusion study is homogeneous and symmetric bilaterally. No perfusion defects evident. IMPRESSION: No appreciable ventilation or perfusion defects. Very low probability of pulmonary embolus. Electronically Signed   By: Bretta Bang III M.D.   On: 12/04/2018 15:27   Vas US Renal Artery Duplex  Result Date: 12/05/2018 ABDOMINAL VISCERAL High Risk Factors: Hypertension. Limitations: Air/bowel gas and obesity.  Examination Guidelines: A complete evaluation includes B-mode imaging, spectral Doppler, color Doppler, and power Doppler as needed of all accessible portions of each vessel. Bilateral testing is considered an integral part of a complete examination. Limited examinations for reoccurring indications may be performed as noted.  Duplex Findings:  Mesenteric Technologist observations: unable to visualize due to overlying bowel gas.  +------------------+--------+--------+-------+ Right Renal ArteryPSV cm/sEDV cm/sComment +------------------+--------+--------+-------+ Origin              146      31           +------------------+--------+--------+-------+ Proximal            191      25           +------------------+--------+--------+-------+ Mid                  74      15           +------------------+--------+--------+-------+  Distal              117      21           +------------------+--------+--------+-------+ +-----------------+--------+--------+--------------+ Left Renal ArteryPSV cm/sEDV cm/s   Comment     +-----------------+--------+--------+--------------+ Origin                           not visualized +-----------------+--------+--------+--------------+ Proximal            63      13                  +-----------------+--------+--------+--------------+ Mid                118      11                  +-----------------+--------+--------+--------------+ Distal             102  18                  +-----------------+--------+--------+--------------+ Technologist observations Renal Artery(s):Left kidney mid pole hypoechoic area measuring 1.72 cm, possibly cystic structure. +------------+--------+--------+----+-----------+--------+--------+----+ Right KidneyPSV cm/sEDV cm/sRI  Left KidneyPSV cm/sEDV cm/sRI   +------------+--------+--------+----+-----------+--------+--------+----+ Upper Pole  30      7       0.75Upper Pole 40      10      0.74 +------------+--------+--------+----+-----------+--------+--------+----+ Mid         54      10      0.82Mid        40      6       0.86 +------------+--------+--------+----+-----------+--------+--------+----+ Lower Pole  39      6       0.84Lower Pole 42      10      0.76 +------------+--------+--------+----+-----------+--------+--------+----+ Hilar       122     19      0.84Hilar      101     27      0.73 +------------+--------+--------+----+-----------+--------+--------+----+ +------------------+-----+------------------+-----+ Right Kidney           Left Kidney             +------------------+-----+------------------+-----+ RAR                    RAR                     +------------------+-----+------------------+-----+ RAR (manual)           RAR (manual)             +------------------+-----+------------------+-----+ Cortex                 Cortex                  +------------------+-----+------------------+-----+ Cortex thickness       Corex thickness         +------------------+-----+------------------+-----+ Kidney length (cm)14.69Kidney length (cm)13.47 +------------------+-----+------------------+-----+   Summary: Renal:  Right: 1-59% stenosis of the right renal artery. Abnormal right        Resistive Index. Left:  No evidence of left renal artery stenosis. Abnormal left        Resisitve Index.  *See table(s) above for measurements and observations.     Preliminary         Scheduled Meds: . apixaban  5 mg Oral BID  . cloNIDine  0.2 mg Oral BID  . furosemide  40 mg Intravenous Q12H  . hydrALAZINE  50 mg Oral Q8H  . irbesartan  150 mg Oral Daily  . labetalol  400 mg Oral BID  . mometasone-formoterol  2 puff Inhalation BID  . nicotine  14 mg Transdermal Daily  . sodium chloride flush  3 mL Intravenous Q12H   Continuous Infusions:   LOS: 1 day   Time spent= 30 mins     Joline Maxcyhirag , MD [facility] Pager [phone]   If 7PM-7AM, please contact night-coverage [email or web address] Password [email or web address] 12/05/2018, 10:42 AM

## 2018-12-05 NOTE — Progress Notes (Signed)
[facility].  I called the office to schedule and they had no openings. Toy CookeyGeisla is working on trying to get it scheduled.   Berton BonJanine Jahking Lesser, AGNP-C [facility] 12/05/2018  3:45 PM Pager: [phone]

## 2018-12-05 NOTE — Plan of Care (Signed)
  Problem: Clinical Measurements: Goal: Diagnostic test results will improve Outcome: Progressing   Problem: Clinical Measurements: Goal: Respiratory complications will improve Outcome: Progressing   Problem: Clinical Measurements: Goal: Cardiovascular complication will be avoided Outcome: Progressing   

## 2018-12-06 LAB — BASIC METABOLIC PANEL
Anion gap: 10 (ref 5–15)
BUN: 31 mg/dL — ABNORMAL HIGH (ref 8–23)
CO2: 27 mmol/L (ref 22–32)
Calcium: 10.1 mg/dL (ref 8.9–10.3)
Chloride: 101 mmol/L (ref 98–111)
Creatinine, Ser: 1.6 mg/dL — ABNORMAL HIGH (ref 0.61–1.24)
GFR calc Af Amer: 53 mL/min — ABNORMAL LOW (ref >60)
GFR calc non Af Amer: 46 mL/min — ABNORMAL LOW (ref >60)
Glucose, Bld: 120 mg/dL — ABNORMAL HIGH (ref 70–99)
Potassium: 4.2 mmol/L (ref 3.5–5.1)
Sodium: 138 mmol/L (ref 135–145)

## 2018-12-06 LAB — CBC
HEMATOCRIT: 36 % — AB (ref 39.0–52.0)
Hemoglobin: 10.7 g/dL — ABNORMAL LOW (ref 13.0–17.0)
MCH: 18.6 pg — ABNORMAL LOW (ref 26.0–34.0)
MCHC: 29.7 g/dL — ABNORMAL LOW (ref 30.0–36.0)
MCV: 62.6 fL — AB (ref 80.0–100.0)
PLATELETS: 228 10*3/uL (ref 150–400)
RBC: 5.75 MIL/uL (ref 4.22–5.81)
RDW: 16 % — ABNORMAL HIGH (ref 11.5–15.5)
WBC: 7.1 10*3/uL (ref 4.0–10.5)
nRBC: 0 % (ref 0.0–0.2)

## 2018-12-06 LAB — MAGNESIUM: Magnesium: 2.4 mg/dL (ref 1.7–2.4)

## 2018-12-06 MED ORDER — POLYETHYLENE GLYCOL 3350 17 G PO PACK: 17.0000 g | PACK | Freq: Every day | ORAL | 0 refills | Status: DC | PRN

## 2018-12-06 MED ORDER — FERROUS SULFATE 325 (65 FE) MG PO TABS: 325.0000 mg | ORAL_TABLET | Freq: Every day | ORAL | 0 refills | Status: DC

## 2018-12-06 MED ORDER — HYDRALAZINE HCL 50 MG PO TABS
100.0000 mg | ORAL_TABLET | Freq: Three times a day (TID) | ORAL | Status: DC
  Administered 2018-12-06: 100 mg via ORAL
  Filled 2018-12-06: qty 2

## 2018-12-06 MED ORDER — HYDRALAZINE HCL 100 MG PO TABS: 100.0000 mg | ORAL_TABLET | Freq: Three times a day (TID) | ORAL | 0 refills | Status: DC

## 2018-12-06 MED ORDER — LABETALOL HCL 100 MG PO TABS
100.0000 mg | ORAL_TABLET | Freq: Two times a day (BID) | ORAL | Status: DC
  Administered 2018-12-06: 100 mg via ORAL

## 2018-12-06 MED ORDER — LABETALOL HCL 100 MG PO TABS: 100.0000 mg | ORAL_TABLET | Freq: Two times a day (BID) | ORAL | 0 refills | Status: DC

## 2018-12-06 MED ORDER — ATORVASTATIN CALCIUM 20 MG PO TABS: 20.0000 mg | ORAL_TABLET | Freq: Every day | ORAL | 0 refills | Status: DC

## 2018-12-06 NOTE — Progress Notes (Signed)
Pt had an episode of 2.23 secs.pause. Pt assessed. Stated he is alright. Denies any complaints

## 2018-12-06 NOTE — Plan of Care (Signed)
  Problem: Clinical Measurements: Goal: Diagnostic test results will improve Outcome: Progressing   Problem: Clinical Measurements: Goal: Cardiovascular complication will be avoided Outcome: Progressing   Problem: Activity: Goal: Risk for activity intolerance will decrease Outcome: Progressing   

## 2018-12-06 NOTE — Discharge Summary (Signed)
Physician Discharge Summary  Jeremy Douglas:[id number] DOB: [birth year] DOA: 12/03/2018  PCP: Darrow Bussing, MD  Admit date: 12/03/2018 Discharge date: 12/06/2018  Admitted From: Home Disposition: Home  Recommendations for Outpatient Follow-up:  1. Follow up with PCP in 1-2 weeks 2. Please obtain BMP/CBC in one week your next doctors visit.  3. Hydralazine added-100 mg 3 times daily. 4. Labetalol decreased 100 mg twice daily 5. Follow outpatient with cardiology in 1-2 weeks.  Patient will also need outpatient stress test which will be arranged by cardiology service.   Discharge Condition: Stable CODE STATUS: DNR Diet recommendation: 2 g salt diet  Brief/Interim Summary: 62 year old with history of PE on Eliquis, obstructive sleep apnea on CPAP, CAD, COPD,, alcohol use, hypertension came to the hospital complains of shortness of breath.  Patient states he has been having off-and-on symptoms of progressive dyspnea on exertion for the past several days and has been evaluated by outpatient pulmonary.  He has tried using Symbicort at home without any relief along with pain medications.  Due to worsening of these complaints he came to the hospital for further evaluation.  During hospitalization his symptoms were thought more clear cardiac reasons rather than pulmonary.  Pulmonary service was consulted.  Cardiology was consulted.  VQ scan was performed which was negative for pulmonary embolism. During the hospitalization patient's blood pressure remained elevated therefore adjustments were made and eventually placed on hydralazine 100 mg 3 times daily and due to concerns of bradycardia labetalol was decreased 200 mg twice daily.  Cardiology recommended outpatient stress test.  Unable to get CT coronary in the hospital due to his renal function.   At this time patient has reached maximal benefit from hospital stay and stable for discharge with outpatient follow-up recommendations as stated  above.   Discharge Diagnoses:  Principal Problem:   Acute on chronic respiratory failure with hypoxia (HCC) Active Problems:   Essential hypertension   Obstructive sleep apnea   Alcohol dependence (HCC)   Anemia   Morbid obesity (HCC)   Pulmonary embolism (HCC)   Chronic pain   COPD mixed type (HCC)   Tobacco abuse   CKD (chronic kidney disease) stage 3, GFR 30-59 ml/min (HCC)   Elevated brain natriuretic peptide (BNP) level  Acute respiratory distress with hypoxia, significantly improved Acute on chronic diastolic congestive heart failure, grade 2 diastolic dysfunction, class I-II -  Improved with diuresis.  Can consider starting diuretics as outpatient but at this time clinically appears to be euvolemic.  Does have trace bilateral lower extremity edema which is significantly improved since the time of admission. -Echocardiogram shows ejection fraction 50 to 55%, grade 2 diastolic dysfunction - VQ scan-low suspicion for PE. -Appreciate cardiology input-plans for outpatient stress test.  Unable to get CT coronary here due to his renal function  Uncontrolled essential hypertension slowly improving -Continue CPAP machine. -Continue clonidine 0.2 mg twice daily, labetalol 100 mg twice daily, irbesartan 150 mg orally daily -Hydralazine has been increased 200 mg every 8 hours.  Labetalol decreased to 100 mg twice daily -Renal ultrasound-right side 1-59% stenosis, no evidence of stenosis on the left  History of pulmonary embolism -Continue Eliquis  Obesity with BMI greater than 40 -Counseled on weight loss, diet and exercise  Chronic pain -Resume his home medications  Obstructive sleep apnea -On CPAP, he admits of being very compliant with this  Anemia, microcytic anemia likely from iron deficiency -Iron supplement started, bowel regimen prescribed.  Multifocal adenopathy - Follow-up outpatient with pulmonary.  Alcohol use -Appears to be stable.  Tobacco  use -Counseled to quit using this.  Patient on Eliquis He is DNR Discharged today in stable condition.  Discharge Instructions   Allergies as of 12/06/2018      Reactions   Amlodipine Swelling   Pt cannot tolerate 10mg       Medication List    TAKE these medications   apixaban 5 MG Tabs tablet Commonly known as:  ELIQUIS Take 1 tablet (5 mg total) by mouth 2 (two) times daily.   atorvastatin 20 MG tablet Commonly known as:  LIPITOR Take 1 tablet (20 mg total) by mouth daily at 6 PM.   budesonide-formoterol 160-4.5 MCG/ACT inhaler Commonly known as:  SYMBICORT Inhale 2 puffs into the lungs 2 (two) times daily.   cloNIDine 0.2 MG tablet Commonly known as:  CATAPRES Take 0.2 mg by mouth 2 (two) times daily.   ferrous sulfate 325 (65 FE) MG tablet Take 1 tablet (325 mg total) by mouth daily with breakfast. Start taking on:  December 07, 2018   furosemide 40 MG tablet Commonly known as:  LASIX Take 40 mg by mouth daily.   hydrALAZINE 100 MG tablet Commonly known as:  APRESOLINE Take 1 tablet (100 mg total) by mouth every 8 (eight) hours.   labetalol 100 MG tablet Commonly known as:  NORMODYNE Take 1 tablet (100 mg total) by mouth 2 (two) times daily. What changed:    medication strength  how much to take   Melatonin 5 MG Tabs Take 5 mg by mouth as needed.   multivitamin with minerals Tabs tablet Take 1 tablet by mouth daily.   OVER THE COUNTER MEDICATION CPAP   oxyCODONE-acetaminophen 10-325 MG tablet Commonly known as:  PERCOCET Take 1 tablet by mouth every 8 (eight) hours as needed for pain (knees).   polyethylene glycol packet Commonly known as:  MIRALAX / GLYCOLAX Take 17 g by mouth daily as needed for moderate constipation or severe constipation.   telmisartan 40 MG tablet Commonly known as:  MICARDIS Take 40 mg by mouth at bedtime.   tiZANidine 4 MG tablet Commonly known as:  ZANAFLEX Take 4 mg by mouth as needed.      Follow-up  Information    [facility] [address] Follow up.   Specialty:  Cardiology Why:  You will be called to schedule nuclear stress for next week.  Contact information: [address] [place] [address] [phone]       Runell Gess, MD Follow up.   Specialties:  Cardiology, Radiology Why:  The office will call you to schedule cardiology follow up after stress test.  Contact information: [address] [place] [address] [phone]        Docia Chuck, Dibas, MD. Schedule an appointment as soon as possible for a visit in 2 week(s).   Specialty:  Family Medicine Contact information: [address] [place] [address] [phone]          Allergies  Allergen Reactions  . Amlodipine Swelling    Pt cannot tolerate 10mg      You were cared for by a hospitalist during your hospital stay. If you have any questions about your discharge medications or the care you received while you were in the hospital after you are discharged, you can call the unit and asked to speak with the hospitalist on call if the hospitalist that took care of you is not available. Once you  are discharged, your primary care physician will handle any further medical issues. Please note that no refills for any discharge medications will be authorized once you are discharged, as it is imperative that you return to your primary care physician (or establish a relationship with a primary care physician if you do not have one) for your aftercare needs so that they can reassess your need for medications and monitor your lab values.  Consultations:  Cardiology   Procedures/Studies: Dg Chest 2 View  Result Date: 12/04/2018 CLINICAL DATA:  Shortness of breath. EXAM: CHEST - 2 VIEW COMPARISON:  Single-view of the chest and CT chest 12/03/2018. PA and lateral chest 04/20/2014. FINDINGS: Bilateral airspace disease has an appearance most  consistent with pulmonary edema. No pneumothorax or pleural effusion. Heart size is mildly enlarged. No acute or focal bony abnormality. IMPRESSION: Bilateral airspace disease most consistent with pulmonary edema. Cardiomegaly. Electronically Signed   By: Drusilla Kanner M.D.   On: 12/04/2018 15:16   Ct Angio Chest Pe W/cm &/or Wo Cm  Result Date: 12/03/2018 CLINICAL DATA:  Shortness of breath. History of previous pulmonary embolus. EXAM: CT ANGIOGRAPHY CHEST WITH CONTRAST TECHNIQUE: Multidetector CT imaging of the chest was performed using the standard protocol during bolus administration of intravenous contrast. Multiplanar CT image reconstructions and MIPs were obtained to evaluate the vascular anatomy. CONTRAST:  ISOVUE-370 IOPAMIDOL (ISOVUE-370) INJECTION 76% COMPARISON:  Chest CT Apr 27, 2018 and chest CT March 14, 2018; chest radiograph December 03, 2018 FINDINGS: Cardiovascular: There is no demonstrable pulmonary embolus. There is no thoracic aortic aneurysm or dissection. Visualized great vessels appear normal except for mild calcification at the origins of the right innominate and left common carotid arteries. There is aortic atherosclerosis as well as foci of coronary artery calcification. There's no pericardial effusion or pericardial thickening. There is a degree of left ventricular hypertrophy. Mediastinum/Nodes: Thyroid appears normal. There are multiple prominent mediastinal lymph nodes, slightly increased from a prior study. There is a lymph node to the left of the aortic arch measuring 3.2 x 1.6 cm. A lymph node in the aortopulmonary window region measures 1.7 x 1.4 cm. There is a lymph node anterior to the distal trachea measuring 1.7 x 1.7 cm. A lymph node to the left of the distal trachea near the carina measures 1.6 x 1.3 cm. There is a lymph node in the subcarinal region measuring 1.6 x 1.3 cm. Multiple smaller mediastinal lymph nodes are also evident. No esophageal lesions are  evident. Lungs/Pleura: Patchy atelectatic changes noted throughout the lungs bilaterally. The lungs show a somewhat mosaic attenuation at multiple sites, felt to be indicative of underlying small airways obstructive disease. There is no frank consolidation. There is no appreciable pleural effusion or pleural thickening. On axial slice 53 series 6, there is a nodular opacity abutting the pleura in the medial segment of the right middle lobe measuring 7 x 6 mm. This nodular opacity was present previously and appears equivocally larger currently. There is a small calcified granuloma in the anterior segment of the left upper lobe. Upper Abdomen: Visualized upper abdominal structures appear unremarkable except for aortic and major mesenteric arterial atherosclerosis. Musculoskeletal: There is degenerative change in the thoracic spine with diffuse idiopathic skeletal hyperostosis. There are no blastic or lytic bone lesions. No chest wall lesions evident. Review of the MIP images confirms the above findings. IMPRESSION: 1. No demonstrable pulmonary embolus. No thoracic aortic aneurysm or dissection evident. There is aortic atherosclerosis. There are foci of coronary artery  and great vessel/major mesenteric arterial vessel calcification. 2.  Left ventricular hypertrophy. 3. Probable small airways obstructive disease bilaterally, characterized by mosaic attenuation in the lungs. Areas of patchy atelectasis noted. No frank consolidation. 4. 7 x 6 mm nodular opacity in the medial segment right middle lobe. This nodular opacity is equivocally larger than on prior studies. Non-contrast chest CT at 6-12 months is recommended. If the nodule is stable at time of repeat CT, then future CT at 18-24 months (from today's scan) is considered optional for low-risk patients, but is recommended for high-risk patients. This recommendation follows the consensus statement: Guidelines for Management of Incidental Pulmonary Nodules Detected on  CT Images: From the Fleischner Society 2017; Radiology 2017; 284:228-243. 5. Multifocal adenopathy, with increase in lymph node size at several sites compared to prior study. Etiology for this finding uncertain. An underlying neoplastic etiology for this finding must be of concern. Nuclear medicine PET study to assess for abnormal metabolic activity of these lymph nodes may be warranted. 6. Diffuse idiopathic skeletal hyperostosis in portions of the thoracic spine. Aortic Atherosclerosis (ICD10-I70.0). Electronically Signed   By: Bretta Bang III M.D.   On: 12/03/2018 09:26   Nm Pulmonary Perf And Vent  Result Date: 12/04/2018 CLINICAL DATA:  Shortness of Breath EXAM: NUCLEAR MEDICINE VENTILATION - PERFUSION LUNG SCAN VIEWS: Anterior, posterior, left lateral, right lateral, RPO, LPO, RAO, LAO-ventilation and perfusion RADIOPHARMACEUTICALS:  30.0 mCi of Tc-1m DTPA aerosol inhalation and 4.0 mCi Tc30m MAA IV COMPARISON:  Chest radiograph December 04, 2018 FINDINGS: Ventilation: Radiotracer uptake on the ventilation study is homogeneous and symmetric bilaterally. No ventilation defects evident. Perfusion: Radiotracer uptake on the perfusion study is homogeneous and symmetric bilaterally. No perfusion defects evident. IMPRESSION: No appreciable ventilation or perfusion defects. Very low probability of pulmonary embolus. Electronically Signed   By: Bretta Bang III M.D.   On: 12/04/2018 15:27   Dg Chest Portable 1 View  Result Date: 12/03/2018 CLINICAL DATA:  Shortness of breath EXAM: PORTABLE CHEST 1 VIEW COMPARISON:  Chest radiograph and chest CT Apr 27, 2018 FINDINGS: There is borderline cardiomegaly with pulmonary vascularity normal. No edema or consolidation. No evident adenopathy. No evident bone lesions. IMPRESSION: Borderline cardiac enlargement.  No edema or consolidation. Electronically Signed   By: Bretta Bang III M.D.   On: 12/03/2018 08:03   Vas US Renal Artery Duplex  Result  Date: 12/05/2018 ABDOMINAL VISCERAL High Risk Factors: Hypertension. Limitations: Air/bowel gas and obesity.  Examination Guidelines: A complete evaluation includes B-mode imaging, spectral Doppler, color Doppler, and power Doppler as needed of all accessible portions of each vessel. Bilateral testing is considered an integral part of a complete examination. Limited examinations for reoccurring indications may be performed as noted.  Duplex Findings:  Mesenteric Technologist observations: unable to visualize due to overlying bowel gas.  +------------------+--------+--------+-------+ Right Renal ArteryPSV cm/sEDV cm/sComment +------------------+--------+--------+-------+ Origin              146      31           +------------------+--------+--------+-------+ Proximal            191      25           +------------------+--------+--------+-------+ Mid                  74      15           +------------------+--------+--------+-------+ Distal  117      21           +------------------+--------+--------+-------+ +-----------------+--------+--------+--------------+ Left Renal ArteryPSV cm/sEDV cm/s   Comment     +-----------------+--------+--------+--------------+ Origin                           not visualized +-----------------+--------+--------+--------------+ Proximal            63      13                  +-----------------+--------+--------+--------------+ Mid                118      11                  +-----------------+--------+--------+--------------+ Distal             102      18                  +-----------------+--------+--------+--------------+ Technologist observations Renal Artery(s):Left kidney mid pole hypoechoic area measuring 1.72 cm, possibly cystic structure. +------------+--------+--------+----+-----------+--------+--------+----+ Right KidneyPSV cm/sEDV cm/sRI  Left KidneyPSV cm/sEDV cm/sRI    +------------+--------+--------+----+-----------+--------+--------+----+ Upper Pole  30      7       0.75Upper Pole 40      10      0.74 +------------+--------+--------+----+-----------+--------+--------+----+ Mid         54      10      0.        40      6       0.86 +------------+--------+--------+----+-----------+--------+--------+----+ Lower Pole  39      6       0.84Lower Pole 42      10      0.76 +------------+--------+--------+----+-----------+--------+--------+----+ Hilar       122     19      0.84Hilar      101     27      0.73 +------------+--------+--------+----+-----------+--------+--------+----+ +------------------+-----+------------------+-----+ Right Kidney           Left Kidney             +------------------+-----+------------------+-----+ RAR                    RAR                     +------------------+-----+------------------+-----+ RAR (manual)           RAR (manual)            +------------------+-----+------------------+-----+ Cortex                 Cortex                  +------------------+-----+------------------+-----+ Cortex thickness       Corex thickness         +------------------+-----+------------------+-----+ Kidney length (cm)14.69Kidney length (cm)13.47 +------------------+-----+------------------+-----+  Summary: Renal:  Right: 1-59% stenosis of the right renal artery. Abnormal right        Resistive Index. Left:  No evidence of left renal artery stenosis. Abnormal left        Resisitve Index.  *See table(s) above for measurements and observations.  Diagnosing physician: Waverly Ferrari MD  Electronically signed by Waverly Ferrari MD on 12/05/2018 at 1:53:09 PM.    Final       Subjective: Knows his blood pressure slightly elevated but still wishes to go  home.  He will follow-up outpatient with his hypertension specialist.  General = no fevers, chills, dizziness, malaise, fatigue HEENT/EYES =  negative for pain, redness, loss of vision, double vision, blurred vision, loss of hearing, sore throat, hoarseness, dysphagia Cardiovascular= negative for chest pain, palpitation, murmurs, lower extremity swelling Respiratory/lungs= negative for shortness of breath, cough, hemoptysis, wheezing, mucus production Gastrointestinal= negative for nausea, vomiting,, abdominal pain, melena, hematemesis Genitourinary= negative for Dysuria, Hematuria, Change in Urinary Frequency MSK = Negative for arthralgia, myalgias, Back Pain, Joint swelling  Neurology= Negative for headache, seizures, numbness, tingling  Psychiatry= Negative for anxiety, depression, suicidal and homocidal ideation Allergy/Immunology= Medication/Food allergy as listed  Skin= Negative for Rash, lesions, ulcers, itching    Discharge Exam: Vitals:   12/06/18 0442 12/06/18 1131  BP: (!) 166/71 (!) 181/79  Pulse: (!) 51 (!) 46  Resp: 18   Temp: (!) 97 F (36.1 C)   SpO2: 96% 98%   Vitals:   12/05/18 1313 12/05/18 1934 12/06/18 0442 12/06/18 1131  BP: (!) 168/71 (!) 137/52 (!) 166/71 (!) 181/79  Pulse: (!) 51 (!) 53 (!) 51 (!) 46  Resp: 16 18 18    Temp:  (!) 97.4 F (36.3 C) (!) 97 F (36.1 C)   TempSrc:  Oral    SpO2:  93% 96% 98%  Weight:   134.8 kg   Height:        General: Pt is alert, awake, not in acute distress Cardiovascular: RRR, S1/S2 +, no rubs, no gallops Respiratory: CTA bilaterally, no wheezing, no rhonchi Abdominal: Soft, NT, ND, bowel sounds + Extremities: no edema, no cyanosis    The results of significant diagnostics from this hospitalization (including imaging, microbiology, ancillary and laboratory) are listed below for reference.     Microbiology: No results found for this or any previous visit (from the past 240 hour(s)).   Labs: BNP (last 3 results) Recent Labs    12/03/18 0733  BNP 831.1*   Basic Metabolic Panel: Recent Labs  Lab 12/03/18 0733 12/04/18 1244 12/05/18 0533  12/06/18 0620  NA 138 139 137 138  K 3.8 3.9 3.6 4.2  CL 103 104 100 101  CO2 21* 21* 25 27  GLUCOSE 153* 151* 124* 120*  BUN 22 24* 29* 31*  CREATININE 1.47* 1.62* 1.55* 1.60*  CALCIUM 9.6 9.6 10.0 10.1  MG  --   --  2.1 2.4   Liver Function Tests: No results for input(s): AST, ALT, ALKPHOS, BILITOT, PROT, ALBUMIN in the last 168 hours. No results for input(s): LIPASE, AMYLASE in the last 168 hours. No results for input(s): AMMONIA in the last 168 hours. CBC: Recent Labs  Lab 12/03/18 0733 12/04/18 1244 12/05/18 0533 12/06/18 0620  WBC 9.6  --  9.1 7.1  NEUTROABS 7.4  --   --   --   HGB 10.3*  --  10.5* 10.7*  HCT 34.9* 33.5* 35.9* 36.0*  MCV 62.7*  --  62.5* 62.6*  PLT 240  --  244 228   Cardiac Enzymes: Recent Labs  Lab 12/03/18 0733  TROPONINI 0.03*   BNP: Invalid input(s): POCBNP CBG: No results for input(s): GLUCAP in the last 168 hours. D-Dimer No results for input(s): DDIMER in the last 72 hours. Hgb A1c No results for input(s): HGBA1C in the last 72 hours. Lipid Profile Recent Labs    12/05/18 0533  CHOL 184  HDL 25*  LDLCALC 128*  TRIG 156*  CHOLHDL 7.4   Thyroid function studies Recent Labs  12/04/18 1244  TSH 4.941*   Anemia work up Recent Labs    12/04/18 1244  VITAMINB12 423  FERRITIN 174  TIBC 364  IRON 51   Urinalysis    Component Value Date/Time   COLORURINE YELLOW 10/12/2014 1332   APPEARANCEUR CLEAR 10/12/2014 1332   LABSPEC 1.020 10/12/2014 1332   PHURINE 6.0 10/12/2014 1332   GLUCOSEU NEGATIVE 10/12/2014 1332   HGBUR NEGATIVE 10/12/2014 1332   BILIRUBINUR NEGATIVE 10/12/2014 1332   KETONESUR NEGATIVE 10/12/2014 1332   PROTEINUR 30 (A) 10/12/2014 1332   UROBILINOGEN 0.2 10/12/2014 1332   NITRITE NEGATIVE 10/12/2014 1332   LEUKOCYTESUR NEGATIVE 10/12/2014 1332   Sepsis Labs Invalid input(s): PROCALCITONIN,  WBC,  LACTICIDVEN Microbiology No results found for this or any previous visit (from the past 240  hour(s)).   Time coordinating discharge:  I have spent 35 minutes face to face with the patient and on the ward discussing the patients care, assessment, plan and disposition with other care givers. >50% of the time was devoted counseling the patient about the risks and benefits of treatment/Discharge disposition and coordinating care.   SIGNED:   Dimple Nanas, MD  [facility] 12/06/2018, 11:57 AM Pager   If 7PM-7AM, please contact night-coverage [email or web address] Password [email or web address]

## 2018-12-06 NOTE — Progress Notes (Signed)
Progress Note  Patient Name: Jeremy PlowmanRoger C Douglas Date of Encounter: 12/06/2018  Primary Cardiologist: Nanetta BattyJonathan Berry, MD   Subjective   Eager to go home.   Inpatient Medications    Scheduled Meds: . apixaban  5 mg Oral BID  . atorvastatin  20 mg Oral q1800  . cloNIDine  0.2 mg Oral BID  . ferrous sulfate  325 mg Oral Q breakfast  . hydrALAZINE  100 mg Oral Q8H  . irbesartan  150 mg Oral Daily  . labetalol  100 mg Oral BID  . mometasone-formoterol  2 puff Inhalation BID  . nicotine  14 mg Transdermal Daily  . sodium chloride flush  3 mL Intravenous Q12H   Continuous Infusions:  PRN Meds: hydrALAZINE, ipratropium-albuterol, oxyCODONE-acetaminophen **AND** oxyCODONE, polyethylene glycol, tiZANidine   Vital Signs    Vitals:   12/05/18 1204 12/05/18 1313 12/05/18 1934 12/06/18 0442  BP: (!) 186/84 (!) 168/71 (!) 137/52 (!) 166/71  Pulse: (!) 47 (!) 51 (!) 53 (!) 51  Resp: 20 16 18 18   Temp: 97.7 F (36.5 C)  (!) 97.4 F (36.3 C) (!) 97 F (36.1 C)  TempSrc: Oral  Oral   SpO2: 96%  93% 96%  Weight:    134.8 kg  Height:        Intake/Output Summary (Last 24 hours) at 12/06/2018 0947 Last data filed at 12/06/2018 0600 Gross per 24 hour  Intake 600 ml  Output 925 ml  Net -325 ml   Filed Weights   12/04/18 0423 12/05/18 0456 12/06/18 0442  Weight: 134.7 kg 134.7 kg 134.8 kg    Telemetry    Sinus rhythm with sinus bradycardia with rates in the 30s and 40s- Personally Reviewed  ECG  No new  Physical Exam   GEN: No acute distress.   Neck: No JVD Cardiac: regular rhythm, normal rate, 2/6 SEM with preserved S2, no rubs, or gallops.  Respiratory: Clear to auscultation bilaterally. GI: Soft, nontender, non-distended  MS: trace edema; No deformity. Neuro:  Nonfocal  Psych: Normal affect   Labs    Chemistry Recent Labs  Lab 12/04/18 1244 12/05/18 0533 12/06/18 0620  NA 139 137 138  K 3.9 3.6 4.2  CL 104 100 101  CO2 21* 25 27  GLUCOSE 151* 124*  120*  BUN 24* 29* 31*  CREATININE 1.62* 1.55* 1.60*  CALCIUM 9.6 10.0 10.1  GFRNONAA 45* 48* 46*  GFRAA 52* 55* 53*  ANIONGAP 14 12 10      Hematology Recent Labs  Lab 12/03/18 0733 12/04/18 1244 12/05/18 0533 12/06/18 0620  WBC 9.6  --  9.1 7.1  RBC 5.57  --  5.74 5.75  HGB 10.3*  --  10.5* 10.7*  HCT 34.9* 33.5* 35.9* 36.0*  MCV 62.7*  --  62.5* 62.6*  MCH 18.5*  --  18.3* 18.6*  MCHC 29.5*  --  29.2* 29.7*  RDW 15.9*  --  15.9* 16.0*  PLT 240  --  244 228    Cardiac Enzymes Recent Labs  Lab 12/03/18 0733  TROPONINI 0.03*   No results for input(s): TROPIPOC in the last 168 hours.   BNP Recent Labs  Lab 12/03/18 0733  BNP 831.1*     DDimer No results for input(s): DDIMER in the last 168 hours.   Radiology    Dg Chest 2 View  Result Date: 12/04/2018 CLINICAL DATA:  Shortness of breath. EXAM: CHEST - 2 VIEW COMPARISON:  Single-view of the chest and CT chest 12/03/2018. PA  and lateral chest 04/20/2014. FINDINGS: Bilateral airspace disease has an appearance most consistent with pulmonary edema. No pneumothorax or pleural effusion. Heart size is mildly enlarged. No acute or focal bony abnormality. IMPRESSION: Bilateral airspace disease most consistent with pulmonary edema. Cardiomegaly. Electronically Signed   By: Drusilla Kanner M.D.   On: 12/04/2018 15:16   Nm Pulmonary Perf And Vent  Result Date: 12/04/2018 CLINICAL DATA:  Shortness of Breath EXAM: NUCLEAR MEDICINE VENTILATION - PERFUSION LUNG SCAN VIEWS: Anterior, posterior, left lateral, right lateral, RPO, LPO, RAO, LAO-ventilation and perfusion RADIOPHARMACEUTICALS:  30.0 mCi of Tc-16m DTPA aerosol inhalation and 4.0 mCi Tc48m MAA IV COMPARISON:  Chest radiograph December 04, 2018 FINDINGS: Ventilation: Radiotracer uptake on the ventilation study is homogeneous and symmetric bilaterally. No ventilation defects evident. Perfusion: Radiotracer uptake on the perfusion study is homogeneous and symmetric  bilaterally. No perfusion defects evident. IMPRESSION: No appreciable ventilation or perfusion defects. Very low probability of pulmonary embolus. Electronically Signed   By: Bretta Bang III M.D.   On: 12/04/2018 15:27   Vas US Renal Artery Duplex  Result Date: 12/05/2018 ABDOMINAL VISCERAL High Risk Factors: Hypertension. Limitations: Air/bowel gas and obesity.  Examination Guidelines: A complete evaluation includes B-mode imaging, spectral Doppler, color Doppler, and power Doppler as needed of all accessible portions of each vessel. Bilateral testing is considered an integral part of a complete examination. Limited examinations for reoccurring indications may be performed as noted.  Duplex Findings:  Mesenteric Technologist observations: unable to visualize due to overlying bowel gas.  +------------------+--------+--------+-------+ Right Renal ArteryPSV cm/sEDV cm/sComment +------------------+--------+--------+-------+ Origin              146      31           +------------------+--------+--------+-------+ Proximal            191      25           +------------------+--------+--------+-------+ Mid                  74      15           +------------------+--------+--------+-------+ Distal              117      21           +------------------+--------+--------+-------+ +-----------------+--------+--------+--------------+ Left Renal ArteryPSV cm/sEDV cm/s   Comment     +-----------------+--------+--------+--------------+ Origin                           not visualized +-----------------+--------+--------+--------------+ Proximal            63      13                  +-----------------+--------+--------+--------------+ Mid                118      11                  +-----------------+--------+--------+--------------+ Distal             102      18                  +-----------------+--------+--------+--------------+ Technologist observations Renal  Artery(s):Left kidney mid pole hypoechoic area measuring 1.72 cm, possibly cystic structure. +------------+--------+--------+----+-----------+--------+--------+----+ Right KidneyPSV cm/sEDV cm/sRI  Left KidneyPSV cm/sEDV cm/sRI   +------------+--------+--------+----+-----------+--------+--------+----+ Upper Pole  30      7  0.75Upper Pole 40      10      0.74 +------------+--------+--------+----+-----------+--------+--------+----+ Mid         54      10      0.        40      6       0.86 +------------+--------+--------+----+-----------+--------+--------+----+ Lower Pole  39      6       0.84Lower Pole 42      10      0.76 +------------+--------+--------+----+-----------+--------+--------+----+ Hilar       122     19      0.84Hilar      101     27      0.73 +------------+--------+--------+----+-----------+--------+--------+----+ +------------------+-----+------------------+-----+ Right Kidney           Left Kidney             +------------------+-----+------------------+-----+ RAR                    RAR                     +------------------+-----+------------------+-----+ RAR (manual)           RAR (manual)            +------------------+-----+------------------+-----+ Cortex                 Cortex                  +------------------+-----+------------------+-----+ Cortex thickness       Corex thickness         +------------------+-----+------------------+-----+ Kidney length (cm)14.69Kidney length (cm)13.47 +------------------+-----+------------------+-----+  Summary: Renal:  Right: 1-59% stenosis of the right renal artery. Abnormal right        Resistive Index. Left:  No evidence of left renal artery stenosis. Abnormal left        Resisitve Index.  *See table(s) above for measurements and observations.  Diagnosing physician: Waverly Ferrari MD  Electronically signed by Waverly Ferrari MD on 12/05/2018 at 1:53:09 PM.     Final     Cardiac Studies   No new  Patient Profile     62 y.o. male with a hx of PE on Eliquis, COPD, tobacco abuse, HTN, OSA on CPAP, obesityand h/oETOH abusewho is being seen for the evaluation of acute diastolic CHF,at the request of Dr. Nelson Chimes, Internal Medicine.  Assessment & Plan   Principal Problem:   Acute on chronic respiratory failure with hypoxia (HCC) Active Problems:   Essential hypertension   Obstructive sleep apnea   Alcohol dependence (HCC)   Anemia   Morbid obesity (HCC)   Pulmonary embolism (HCC)   Chronic pain   COPD mixed type (HCC)   Tobacco abuse   CKD (chronic kidney disease) stage 3, GFR 30-59 ml/min (HCC)   Elevated brain natriuretic peptide (BNP) level   Hypertension- continues to have elevated blood pressure, regimen includes clonidine 0.2 mg twice daily, hydralazine, labetalol, irbesartan.  Due to bradycardia, I will decrease the dose of labetalol to 100 mg BID.  He is asymptomatic with bradycardia.  Continued elevated blood pressure, increase hydralazine 100 mg TID.  He has a hypertension specialist in the [facility] system and has an appointment in January.  I have encouraged him to keep this appointment for continued medication titration.  I have also emphasized weight loss and lifestyle modification to assist with hypertension control.  The patient is quite keen to go home today,  this would be reasonable if close follow-up is arranged with primary care.  Acute diastolic HF - May need furosemide daily when renal function has improved.  [facility] will sign off.   Medication Recommendations: Clonidine 0.2 mg twice daily, hydralazine 100 mg 3 times daily, labetalol 100 mg twice daily, irbesartan 150 mg.  Consider adding maintenance dose of furosemide when renal function improves. Other recommendations (labs, testing, etc): Close PCP follow-up in 7 to 10 days. With [facility]. Follow up as an outpatient: Follow-up with cardiology 1 to 2 weeks in Dr.  [facility] clinic. Per Dr. Mayford Knife: Consider  PYP scan to rule out amyloidosis.  For questions or updates, please contact [facility] Please consult [email or web address] for contact info under        Signed, Parke Poisson, MD  12/06/2018, 9:47 AM

## 2018-12-06 NOTE — Progress Notes (Signed)
Patient is alert oriented with no  Complaints ready for d/c.plan to follow up with cardiologist appt will be made from office call.

## 2018-12-08 ENCOUNTER — Encounter: Payer: Self-pay | Admitting: Family Medicine

## 2018-12-10 ENCOUNTER — Telehealth (HOSPITAL_COMMUNITY): Payer: Self-pay

## 2018-12-10 NOTE — Telephone Encounter (Signed)
Encounter complete. 

## 2018-12-11 ENCOUNTER — Ambulatory Visit (HOSPITAL_COMMUNITY)
Admission: RE | Admit: 2018-12-11 | Discharge: 2018-12-11 | Disposition: A | Discharge status: Home / Self Care | Payer: BLUE CROSS/BLUE SHIELD | Source: Ambulatory Visit | Attending: Cardiology | Admitting: Cardiology

## 2018-12-11 DIAGNOSIS — R0602 Shortness of breath: Secondary | ICD-10-CM | POA: Diagnosis not present

## 2018-12-11 MED ORDER — REGADENOSON 0.4 MG/5ML IV SOLN
0.4000 mg | Freq: Once | INTRAVENOUS | Status: AC
  Administered 2018-12-11: 0.4 mg via INTRAVENOUS

## 2018-12-11 MED ORDER — TECHNETIUM TC 99M TETROFOSMIN IV KIT
30.7000 | PACK | Freq: Once | INTRAVENOUS | Status: AC | PRN
  Administered 2018-12-11: 30.7 via INTRAVENOUS
  Filled 2018-12-11: qty 31

## 2018-12-12 ENCOUNTER — Ambulatory Visit (HOSPITAL_COMMUNITY)
Admission: RE | Admit: 2018-12-12 | Discharge: 2018-12-12 | Disposition: A | Discharge status: Home / Self Care | Payer: BLUE CROSS/BLUE SHIELD | Source: Ambulatory Visit | Attending: Internal Medicine | Admitting: Internal Medicine

## 2018-12-12 DIAGNOSIS — R590 Localized enlarged lymph nodes: Secondary | ICD-10-CM | POA: Diagnosis not present

## 2018-12-12 DIAGNOSIS — I10 Essential (primary) hypertension: Secondary | ICD-10-CM | POA: Diagnosis not present

## 2018-12-12 LAB — MYOCARDIAL PERFUSION IMAGING
LV dias vol: 214 mL (ref 62–150)
LV sys vol: 106 mL
Peak HR: 75 {beats}/min
Rest HR: 60 {beats}/min
SDS: 4
SRS: 0
SSS: 4
TID: 0.97

## 2018-12-12 MED ORDER — TECHNETIUM TC 99M TETROFOSMIN IV KIT
27.7000 | PACK | Freq: Once | INTRAVENOUS | Status: AC | PRN
  Administered 2018-12-12: 27.7 via INTRAVENOUS

## 2018-12-15 ENCOUNTER — Telehealth: Payer: Self-pay | Admitting: *Deleted

## 2018-12-15 ENCOUNTER — Telehealth: Payer: Self-pay | Admitting: Cardiovascular Disease

## 2018-12-15 NOTE — Telephone Encounter (Signed)
Left message for patient to call and schedule f/u myoview and post hospital appointment with Dr. Allyson SabalBerry

## 2018-12-15 NOTE — Telephone Encounter (Signed)
Called patient to schedule 2 week fu appt with Dr. Allyson SabalBerry.  Patient states his insurance is changing January 1 and he will no longer be able to come here. [facility]

## 2018-12-22 ENCOUNTER — Telehealth: Payer: Self-pay | Admitting: *Deleted

## 2018-12-22 DIAGNOSIS — Z79891 Long term (current) use of opiate analgesic: Secondary | ICD-10-CM | POA: Diagnosis not present

## 2018-12-22 DIAGNOSIS — Z79899 Other long term (current) drug therapy: Secondary | ICD-10-CM | POA: Diagnosis not present

## 2018-12-22 DIAGNOSIS — G894 Chronic pain syndrome: Secondary | ICD-10-CM | POA: Diagnosis not present

## 2018-12-22 DIAGNOSIS — M549 Dorsalgia, unspecified: Secondary | ICD-10-CM | POA: Diagnosis not present

## 2018-12-22 DIAGNOSIS — M545 Low back pain: Secondary | ICD-10-CM | POA: Diagnosis not present

## 2018-12-22 DIAGNOSIS — M1710 Unilateral primary osteoarthritis, unspecified knee: Secondary | ICD-10-CM | POA: Diagnosis not present

## 2018-12-22 NOTE — Telephone Encounter (Signed)
Spoke with  patient to schedule post hospital visit--[facility] and he will schedule an appointment there.

## 2019-01-20 DIAGNOSIS — M545 Low back pain: Secondary | ICD-10-CM | POA: Diagnosis not present

## 2019-01-20 DIAGNOSIS — E669 Obesity, unspecified: Secondary | ICD-10-CM | POA: Diagnosis not present

## 2019-01-20 DIAGNOSIS — G894 Chronic pain syndrome: Secondary | ICD-10-CM | POA: Diagnosis not present

## 2019-01-20 DIAGNOSIS — M1710 Unilateral primary osteoarthritis, unspecified knee: Secondary | ICD-10-CM | POA: Diagnosis not present

## 2019-01-26 ENCOUNTER — Ambulatory Visit: Payer: BLUE CROSS/BLUE SHIELD | Admitting: Internal Medicine

## 2019-02-13 DIAGNOSIS — R7301 Impaired fasting glucose: Secondary | ICD-10-CM | POA: Diagnosis not present

## 2019-02-13 DIAGNOSIS — D509 Iron deficiency anemia, unspecified: Secondary | ICD-10-CM | POA: Diagnosis not present

## 2019-02-13 DIAGNOSIS — I10 Essential (primary) hypertension: Secondary | ICD-10-CM | POA: Diagnosis not present

## 2019-02-13 DIAGNOSIS — Z86711 Personal history of pulmonary embolism: Secondary | ICD-10-CM | POA: Diagnosis not present

## 2019-02-13 DIAGNOSIS — J449 Chronic obstructive pulmonary disease, unspecified: Secondary | ICD-10-CM | POA: Diagnosis not present

## 2019-02-13 DIAGNOSIS — E785 Hyperlipidemia, unspecified: Secondary | ICD-10-CM | POA: Diagnosis not present

## 2019-02-23 DIAGNOSIS — M792 Neuralgia and neuritis, unspecified: Secondary | ICD-10-CM | POA: Diagnosis not present

## 2019-02-23 DIAGNOSIS — M25561 Pain in right knee: Secondary | ICD-10-CM | POA: Diagnosis not present

## 2019-02-23 DIAGNOSIS — G8929 Other chronic pain: Secondary | ICD-10-CM | POA: Diagnosis not present

## 2019-02-23 DIAGNOSIS — G894 Chronic pain syndrome: Secondary | ICD-10-CM | POA: Diagnosis not present

## 2019-02-24 ENCOUNTER — Other Ambulatory Visit: Payer: Self-pay | Admitting: Internal Medicine

## 2019-02-27 DIAGNOSIS — J449 Chronic obstructive pulmonary disease, unspecified: Secondary | ICD-10-CM | POA: Diagnosis not present

## 2019-02-27 DIAGNOSIS — R599 Enlarged lymph nodes, unspecified: Secondary | ICD-10-CM | POA: Diagnosis not present

## 2019-02-27 DIAGNOSIS — F17210 Nicotine dependence, cigarettes, uncomplicated: Secondary | ICD-10-CM | POA: Diagnosis not present

## 2019-02-27 DIAGNOSIS — R0989 Other specified symptoms and signs involving the circulatory and respiratory systems: Secondary | ICD-10-CM | POA: Diagnosis not present

## 2019-02-27 DIAGNOSIS — I6521 Occlusion and stenosis of right carotid artery: Secondary | ICD-10-CM | POA: Diagnosis not present

## 2019-02-27 DIAGNOSIS — R911 Solitary pulmonary nodule: Secondary | ICD-10-CM | POA: Diagnosis not present

## 2019-03-06 DIAGNOSIS — R911 Solitary pulmonary nodule: Secondary | ICD-10-CM | POA: Diagnosis not present

## 2019-03-16 DIAGNOSIS — R911 Solitary pulmonary nodule: Secondary | ICD-10-CM | POA: Diagnosis not present

## 2019-03-16 DIAGNOSIS — F17210 Nicotine dependence, cigarettes, uncomplicated: Secondary | ICD-10-CM | POA: Diagnosis not present

## 2019-03-16 DIAGNOSIS — R599 Enlarged lymph nodes, unspecified: Secondary | ICD-10-CM | POA: Diagnosis not present

## 2019-03-16 DIAGNOSIS — J449 Chronic obstructive pulmonary disease, unspecified: Secondary | ICD-10-CM | POA: Diagnosis not present

## 2019-03-23 DIAGNOSIS — Z79891 Long term (current) use of opiate analgesic: Secondary | ICD-10-CM | POA: Diagnosis not present

## 2019-03-23 DIAGNOSIS — G894 Chronic pain syndrome: Secondary | ICD-10-CM | POA: Diagnosis not present

## 2019-03-23 DIAGNOSIS — M25561 Pain in right knee: Secondary | ICD-10-CM | POA: Diagnosis not present

## 2019-03-23 DIAGNOSIS — M792 Neuralgia and neuritis, unspecified: Secondary | ICD-10-CM | POA: Diagnosis not present

## 2019-04-22 DIAGNOSIS — G894 Chronic pain syndrome: Secondary | ICD-10-CM | POA: Diagnosis not present

## 2019-04-22 DIAGNOSIS — M25561 Pain in right knee: Secondary | ICD-10-CM | POA: Diagnosis not present

## 2019-04-22 DIAGNOSIS — Z79899 Other long term (current) drug therapy: Secondary | ICD-10-CM | POA: Diagnosis not present

## 2019-04-22 DIAGNOSIS — Z79891 Long term (current) use of opiate analgesic: Secondary | ICD-10-CM | POA: Diagnosis not present

## 2019-05-20 DIAGNOSIS — G894 Chronic pain syndrome: Secondary | ICD-10-CM | POA: Diagnosis not present

## 2019-05-20 DIAGNOSIS — M25561 Pain in right knee: Secondary | ICD-10-CM | POA: Diagnosis not present

## 2019-05-20 DIAGNOSIS — Z79891 Long term (current) use of opiate analgesic: Secondary | ICD-10-CM | POA: Diagnosis not present

## 2019-05-20 DIAGNOSIS — Z79899 Other long term (current) drug therapy: Secondary | ICD-10-CM | POA: Diagnosis not present

## 2019-06-02 DIAGNOSIS — M25561 Pain in right knee: Secondary | ICD-10-CM | POA: Diagnosis not present

## 2019-06-02 DIAGNOSIS — G8929 Other chronic pain: Secondary | ICD-10-CM | POA: Diagnosis not present

## 2019-06-02 DIAGNOSIS — M1711 Unilateral primary osteoarthritis, right knee: Secondary | ICD-10-CM | POA: Diagnosis not present

## 2019-06-17 DIAGNOSIS — M25561 Pain in right knee: Secondary | ICD-10-CM | POA: Diagnosis not present

## 2019-06-17 DIAGNOSIS — M1711 Unilateral primary osteoarthritis, right knee: Secondary | ICD-10-CM | POA: Diagnosis not present

## 2019-06-23 DIAGNOSIS — Z79899 Other long term (current) drug therapy: Secondary | ICD-10-CM | POA: Diagnosis not present

## 2019-06-23 DIAGNOSIS — Z79891 Long term (current) use of opiate analgesic: Secondary | ICD-10-CM | POA: Diagnosis not present

## 2019-06-23 DIAGNOSIS — M25561 Pain in right knee: Secondary | ICD-10-CM | POA: Diagnosis not present

## 2019-06-23 DIAGNOSIS — G894 Chronic pain syndrome: Secondary | ICD-10-CM | POA: Diagnosis not present

## 2019-07-08 DIAGNOSIS — M25561 Pain in right knee: Secondary | ICD-10-CM | POA: Diagnosis not present

## 2019-07-08 DIAGNOSIS — M1711 Unilateral primary osteoarthritis, right knee: Secondary | ICD-10-CM | POA: Diagnosis not present

## 2019-07-08 DIAGNOSIS — G8929 Other chronic pain: Secondary | ICD-10-CM | POA: Diagnosis not present

## 2019-08-11 DIAGNOSIS — Z79891 Long term (current) use of opiate analgesic: Secondary | ICD-10-CM | POA: Diagnosis not present

## 2019-08-11 DIAGNOSIS — M792 Neuralgia and neuritis, unspecified: Secondary | ICD-10-CM | POA: Diagnosis not present

## 2019-08-11 DIAGNOSIS — Z79899 Other long term (current) drug therapy: Secondary | ICD-10-CM | POA: Diagnosis not present

## 2019-08-11 DIAGNOSIS — G894 Chronic pain syndrome: Secondary | ICD-10-CM | POA: Diagnosis not present

## 2019-08-20 ENCOUNTER — Other Ambulatory Visit: Payer: Self-pay | Admitting: Internal Medicine

## 2019-09-04 DIAGNOSIS — R7303 Prediabetes: Secondary | ICD-10-CM | POA: Diagnosis not present

## 2019-09-04 DIAGNOSIS — I10 Essential (primary) hypertension: Secondary | ICD-10-CM | POA: Diagnosis not present

## 2019-09-04 DIAGNOSIS — Z23 Encounter for immunization: Secondary | ICD-10-CM | POA: Diagnosis not present

## 2019-09-04 DIAGNOSIS — R55 Syncope and collapse: Secondary | ICD-10-CM | POA: Diagnosis not present

## 2019-09-04 DIAGNOSIS — G4733 Obstructive sleep apnea (adult) (pediatric): Secondary | ICD-10-CM | POA: Diagnosis not present

## 2019-09-04 DIAGNOSIS — E782 Mixed hyperlipidemia: Secondary | ICD-10-CM | POA: Diagnosis not present

## 2019-09-04 DIAGNOSIS — D509 Iron deficiency anemia, unspecified: Secondary | ICD-10-CM | POA: Diagnosis not present

## 2019-09-04 DIAGNOSIS — J449 Chronic obstructive pulmonary disease, unspecified: Secondary | ICD-10-CM | POA: Diagnosis not present

## 2019-09-04 DIAGNOSIS — F17200 Nicotine dependence, unspecified, uncomplicated: Secondary | ICD-10-CM | POA: Diagnosis not present

## 2019-09-05 DIAGNOSIS — I440 Atrioventricular block, first degree: Secondary | ICD-10-CM | POA: Diagnosis not present

## 2019-09-09 DIAGNOSIS — R590 Localized enlarged lymph nodes: Secondary | ICD-10-CM | POA: Diagnosis not present

## 2019-09-09 DIAGNOSIS — R918 Other nonspecific abnormal finding of lung field: Secondary | ICD-10-CM | POA: Diagnosis not present

## 2019-09-09 DIAGNOSIS — I700 Atherosclerosis of aorta: Secondary | ICD-10-CM | POA: Diagnosis not present

## 2019-09-09 DIAGNOSIS — I2510 Atherosclerotic heart disease of native coronary artery without angina pectoris: Secondary | ICD-10-CM | POA: Diagnosis not present

## 2019-09-09 DIAGNOSIS — R911 Solitary pulmonary nodule: Secondary | ICD-10-CM | POA: Diagnosis not present

## 2019-09-15 DIAGNOSIS — R911 Solitary pulmonary nodule: Secondary | ICD-10-CM | POA: Diagnosis not present

## 2019-09-15 DIAGNOSIS — J449 Chronic obstructive pulmonary disease, unspecified: Secondary | ICD-10-CM | POA: Diagnosis not present

## 2019-09-15 DIAGNOSIS — F17210 Nicotine dependence, cigarettes, uncomplicated: Secondary | ICD-10-CM | POA: Diagnosis not present

## 2019-09-17 DIAGNOSIS — N289 Disorder of kidney and ureter, unspecified: Secondary | ICD-10-CM | POA: Diagnosis not present

## 2019-09-17 DIAGNOSIS — D509 Iron deficiency anemia, unspecified: Secondary | ICD-10-CM | POA: Diagnosis not present

## 2019-10-09 DIAGNOSIS — N289 Disorder of kidney and ureter, unspecified: Secondary | ICD-10-CM | POA: Diagnosis not present

## 2019-10-09 DIAGNOSIS — D509 Iron deficiency anemia, unspecified: Secondary | ICD-10-CM | POA: Diagnosis not present

## 2019-10-15 DIAGNOSIS — D509 Iron deficiency anemia, unspecified: Secondary | ICD-10-CM | POA: Diagnosis not present

## 2019-10-15 DIAGNOSIS — R001 Bradycardia, unspecified: Secondary | ICD-10-CM | POA: Diagnosis not present

## 2019-10-15 DIAGNOSIS — I10 Essential (primary) hypertension: Secondary | ICD-10-CM | POA: Diagnosis not present

## 2019-10-15 DIAGNOSIS — R55 Syncope and collapse: Secondary | ICD-10-CM | POA: Diagnosis not present

## 2019-10-15 DIAGNOSIS — Z79899 Other long term (current) drug therapy: Secondary | ICD-10-CM | POA: Diagnosis not present

## 2019-10-15 DIAGNOSIS — I440 Atrioventricular block, first degree: Secondary | ICD-10-CM | POA: Diagnosis not present

## 2019-10-15 DIAGNOSIS — I350 Nonrheumatic aortic (valve) stenosis: Secondary | ICD-10-CM | POA: Diagnosis not present

## 2019-11-11 DIAGNOSIS — G894 Chronic pain syndrome: Secondary | ICD-10-CM | POA: Diagnosis not present

## 2019-11-11 DIAGNOSIS — M25561 Pain in right knee: Secondary | ICD-10-CM | POA: Diagnosis not present

## 2019-11-11 DIAGNOSIS — Z79899 Other long term (current) drug therapy: Secondary | ICD-10-CM | POA: Diagnosis not present

## 2019-11-11 DIAGNOSIS — Z79891 Long term (current) use of opiate analgesic: Secondary | ICD-10-CM | POA: Diagnosis not present

## 2019-11-30 ENCOUNTER — Other Ambulatory Visit: Payer: Self-pay | Admitting: Internal Medicine

## 2019-11-30 NOTE — Telephone Encounter (Signed)
Dr. Annamaria Boots, this patient was last seen by you for elevated d-dimer on 09/25/2018. Was then seen again in clinic by Rexene Edison, NP on 12/02/18.  Please advise if you want this patient to come into clinic for OV and tests, or for this prescription request to be denied. Thank you.

## 2019-12-01 NOTE — Telephone Encounter (Signed)
Eliquis refill e-sent.  Pt needs routine officee visit sometime in next 2-3 months please.

## 2019-12-01 NOTE — Telephone Encounter (Signed)
Attempted to call pt but unable to reach. Left message for pt to return call to have appt scheduled in the next 2-3 months.

## 2020-12-05 ENCOUNTER — Other Ambulatory Visit: Payer: Self-pay | Admitting: Internal Medicine

## 2022-07-09 ENCOUNTER — Observation Stay (HOSPITAL_COMMUNITY): Payer: No Typology Code available for payment source

## 2022-07-09 ENCOUNTER — Emergency Department (HOSPITAL_COMMUNITY): Payer: No Typology Code available for payment source

## 2022-07-09 ENCOUNTER — Encounter (HOSPITAL_COMMUNITY): Payer: Self-pay | Admitting: Emergency Medicine

## 2022-07-09 ENCOUNTER — Other Ambulatory Visit: Payer: Self-pay

## 2022-07-09 ENCOUNTER — Inpatient Hospital Stay (HOSPITAL_COMMUNITY)
Admission: EM | Admit: 2022-07-09 | Discharge: 2022-07-13 | DRG: 291 | Disposition: A | Discharge status: Home / Self Care | Payer: No Typology Code available for payment source | Attending: Internal Medicine | Admitting: Internal Medicine

## 2022-07-09 DIAGNOSIS — J449 Chronic obstructive pulmonary disease, unspecified: Secondary | ICD-10-CM | POA: Diagnosis present

## 2022-07-09 DIAGNOSIS — F17210 Nicotine dependence, cigarettes, uncomplicated: Secondary | ICD-10-CM | POA: Diagnosis present

## 2022-07-09 DIAGNOSIS — Z96653 Presence of artificial knee joint, bilateral: Secondary | ICD-10-CM | POA: Diagnosis present

## 2022-07-09 DIAGNOSIS — I161 Hypertensive emergency: Secondary | ICD-10-CM | POA: Diagnosis present

## 2022-07-09 DIAGNOSIS — I5033 Acute on chronic diastolic (congestive) heart failure: Secondary | ICD-10-CM | POA: Diagnosis present

## 2022-07-09 DIAGNOSIS — R778 Other specified abnormalities of plasma proteins: Secondary | ICD-10-CM | POA: Diagnosis present

## 2022-07-09 DIAGNOSIS — R7989 Other specified abnormal findings of blood chemistry: Secondary | ICD-10-CM | POA: Diagnosis present

## 2022-07-09 DIAGNOSIS — Z79899 Other long term (current) drug therapy: Secondary | ICD-10-CM

## 2022-07-09 DIAGNOSIS — F10939 Alcohol use, unspecified with withdrawal, unspecified: Secondary | ICD-10-CM | POA: Diagnosis present

## 2022-07-09 DIAGNOSIS — I2510 Atherosclerotic heart disease of native coronary artery without angina pectoris: Secondary | ICD-10-CM | POA: Diagnosis present

## 2022-07-09 DIAGNOSIS — Z7901 Long term (current) use of anticoagulants: Secondary | ICD-10-CM

## 2022-07-09 DIAGNOSIS — I130 Hypertensive heart and chronic kidney disease with heart failure and stage 1 through stage 4 chronic kidney disease, or unspecified chronic kidney disease: Secondary | ICD-10-CM | POA: Diagnosis not present

## 2022-07-09 DIAGNOSIS — F10239 Alcohol dependence with withdrawal, unspecified: Secondary | ICD-10-CM | POA: Diagnosis present

## 2022-07-09 DIAGNOSIS — E782 Mixed hyperlipidemia: Secondary | ICD-10-CM | POA: Diagnosis present

## 2022-07-09 DIAGNOSIS — Z91148 Patient's other noncompliance with medication regimen for other reason: Secondary | ICD-10-CM

## 2022-07-09 DIAGNOSIS — I10 Essential (primary) hypertension: Secondary | ICD-10-CM | POA: Diagnosis present

## 2022-07-09 DIAGNOSIS — Z20822 Contact with and (suspected) exposure to covid-19: Secondary | ICD-10-CM | POA: Diagnosis present

## 2022-07-09 DIAGNOSIS — J441 Chronic obstructive pulmonary disease with (acute) exacerbation: Secondary | ICD-10-CM | POA: Diagnosis present

## 2022-07-09 DIAGNOSIS — Z8249 Family history of ischemic heart disease and other diseases of the circulatory system: Secondary | ICD-10-CM

## 2022-07-09 DIAGNOSIS — Z6838 Body mass index (BMI) 38.0-38.9, adult: Secondary | ICD-10-CM

## 2022-07-09 DIAGNOSIS — J9601 Acute respiratory failure with hypoxia: Secondary | ICD-10-CM | POA: Diagnosis present

## 2022-07-09 DIAGNOSIS — Z86711 Personal history of pulmonary embolism: Secondary | ICD-10-CM | POA: Diagnosis present

## 2022-07-09 DIAGNOSIS — G4733 Obstructive sleep apnea (adult) (pediatric): Secondary | ICD-10-CM | POA: Diagnosis present

## 2022-07-09 DIAGNOSIS — I248 Other forms of acute ischemic heart disease: Secondary | ICD-10-CM | POA: Diagnosis present

## 2022-07-09 DIAGNOSIS — Z7951 Long term (current) use of inhaled steroids: Secondary | ICD-10-CM

## 2022-07-09 DIAGNOSIS — Z888 Allergy status to other drugs, medicaments and biological substances status: Secondary | ICD-10-CM

## 2022-07-09 LAB — CBC WITH DIFFERENTIAL/PLATELET
Abs Immature Granulocytes: 0.02 10*3/uL (ref 0.00–0.07)
Basophils Absolute: 0.1 10*3/uL (ref 0.0–0.1)
Basophils Relative: 1 %
Eosinophils Absolute: 0.1 10*3/uL (ref 0.0–0.5)
Eosinophils Relative: 1 %
HCT: 37.6 % — ABNORMAL LOW (ref 39.0–52.0)
Hemoglobin: 11.8 g/dL — ABNORMAL LOW (ref 13.0–17.0)
Immature Granulocytes: 0 %
Lymphocytes Relative: 21 %
Lymphs Abs: 1.7 10*3/uL (ref 0.7–4.0)
MCH: 19.6 pg — ABNORMAL LOW (ref 26.0–34.0)
MCHC: 31.4 g/dL (ref 30.0–36.0)
MCV: 62.6 fL — ABNORMAL LOW (ref 80.0–100.0)
Monocytes Absolute: 0.6 10*3/uL (ref 0.1–1.0)
Monocytes Relative: 7 %
Neutro Abs: 6 10*3/uL (ref 1.7–7.7)
Neutrophils Relative %: 70 %
Platelets: 138 10*3/uL — ABNORMAL LOW (ref 150–400)
RBC: 6.01 MIL/uL — ABNORMAL HIGH (ref 4.22–5.81)
RDW: 18 % — ABNORMAL HIGH (ref 11.5–15.5)
WBC: 8.4 10*3/uL (ref 4.0–10.5)
nRBC: 0 % (ref 0.0–0.2)

## 2022-07-09 LAB — COMPREHENSIVE METABOLIC PANEL
ALT: 33 U/L (ref 0–44)
AST: 54 U/L — ABNORMAL HIGH (ref 15–41)
Albumin: 3 g/dL — ABNORMAL LOW (ref 3.5–5.0)
Alkaline Phosphatase: 135 U/L — ABNORMAL HIGH (ref 38–126)
Anion gap: 15 (ref 5–15)
BUN: 21 mg/dL (ref 8–23)
CO2: 16 mmol/L — ABNORMAL LOW (ref 22–32)
Calcium: 7.7 mg/dL — ABNORMAL LOW (ref 8.9–10.3)
Chloride: 108 mmol/L (ref 98–111)
Creatinine, Ser: 1.32 mg/dL — ABNORMAL HIGH (ref 0.61–1.24)
GFR, Estimated: >60 mL/min (ref >60)
Glucose, Bld: 94 mg/dL (ref 70–99)
Potassium: 3.8 mmol/L (ref 3.5–5.1)
Sodium: 139 mmol/L (ref 135–145)
Total Bilirubin: 1.2 mg/dL (ref 0.3–1.2)
Total Protein: 6.7 g/dL (ref 6.5–8.1)

## 2022-07-09 LAB — I-STAT ARTERIAL BLOOD GAS, ED
Acid-base deficit: 4 mmol/L — ABNORMAL HIGH (ref 0.0–2.0)
Bicarbonate: 19.3 mmol/L — ABNORMAL LOW (ref 20.0–28.0)
Calcium, Ion: 1.1 mmol/L — ABNORMAL LOW (ref 1.15–1.40)
HCT: 41 % (ref 39.0–52.0)
Hemoglobin: 13.9 g/dL (ref 13.0–17.0)
O2 Saturation: 94 %
Patient temperature: 97.5
Potassium: 3.8 mmol/L (ref 3.5–5.1)
Sodium: 139 mmol/L (ref 135–145)
TCO2: 20 mmol/L — ABNORMAL LOW (ref 22–32)
pCO2 arterial: 29.3 mmHg — ABNORMAL LOW (ref 32–48)
pH, Arterial: 7.424 (ref 7.35–7.45)
pO2, Arterial: 67 mmHg — ABNORMAL LOW (ref 83–108)

## 2022-07-09 LAB — D-DIMER, QUANTITATIVE: D-Dimer, Quant: 2.82 ug/mL-FEU — ABNORMAL HIGH (ref 0.00–0.50)

## 2022-07-09 LAB — TROPONIN I (HIGH SENSITIVITY)
Troponin I (High Sensitivity): 40 ng/L — ABNORMAL HIGH (ref <18)
Troponin I (High Sensitivity): 48 ng/L — ABNORMAL HIGH (ref <18)

## 2022-07-09 LAB — SARS CORONAVIRUS 2 BY RT PCR: SARS Coronavirus 2 by RT PCR: NEGATIVE

## 2022-07-09 LAB — BRAIN NATRIURETIC PEPTIDE: B Natriuretic Peptide: 144.5 pg/mL — ABNORMAL HIGH (ref 0.0–100.0)

## 2022-07-09 MED ORDER — ONDANSETRON HCL 4 MG PO TABS
4.0000 mg | ORAL_TABLET | Freq: Four times a day (QID) | ORAL | Status: DC | PRN
Start: 2022-07-09 — End: 2022-07-10

## 2022-07-09 MED ORDER — ONDANSETRON HCL 4 MG/2ML IJ SOLN: 4.0000 mg | Freq: Four times a day (QID) | INTRAMUSCULAR | Status: DC | PRN

## 2022-07-09 MED ORDER — ADULT MULTIVITAMIN W/MINERALS CH
1.0000 | ORAL_TABLET | Freq: Every day | ORAL | Status: DC
  Administered 2022-07-10 – 2022-07-13 (×4): 1 via ORAL
  Filled 2022-07-09 (×4): qty 1

## 2022-07-09 MED ORDER — IPRATROPIUM-ALBUTEROL 0.5-2.5 (3) MG/3ML IN SOLN
3.0000 mL | Freq: Four times a day (QID) | RESPIRATORY_TRACT | Status: DC
  Administered 2022-07-10 (×3): 3 mL via RESPIRATORY_TRACT
  Filled 2022-07-09 (×5): qty 3

## 2022-07-09 MED ORDER — APIXABAN 5 MG PO TABS
5.0000 mg | ORAL_TABLET | Freq: Two times a day (BID) | ORAL | Status: DC
  Administered 2022-07-10 – 2022-07-13 (×8): 5 mg via ORAL
  Filled 2022-07-09 (×7): qty 1

## 2022-07-09 MED ORDER — POLYETHYLENE GLYCOL 3350 17 G PO PACK: 17.0000 g | PACK | Freq: Every day | ORAL | Status: DC | PRN

## 2022-07-09 MED ORDER — LOSARTAN POTASSIUM 50 MG PO TABS
50.0000 mg | ORAL_TABLET | Freq: Every day | ORAL | Status: DC
  Administered 2022-07-09 – 2022-07-11 (×3): 50 mg via ORAL
  Filled 2022-07-09 (×4): qty 1

## 2022-07-09 MED ORDER — IPRATROPIUM-ALBUTEROL 0.5-2.5 (3) MG/3ML IN SOLN
3.0000 mL | RESPIRATORY_TRACT | Status: DC | PRN
  Administered 2022-07-10: 3 mL via RESPIRATORY_TRACT

## 2022-07-09 MED ORDER — ACETAMINOPHEN 650 MG RE SUPP: 650.0000 mg | Freq: Four times a day (QID) | RECTAL | Status: DC | PRN

## 2022-07-09 MED ORDER — IPRATROPIUM-ALBUTEROL 0.5-2.5 (3) MG/3ML IN SOLN
3.0000 mL | Freq: Once | RESPIRATORY_TRACT | Status: AC
  Administered 2022-07-09: 3 mL via RESPIRATORY_TRACT
  Filled 2022-07-09: qty 3

## 2022-07-09 MED ORDER — LORAZEPAM 2 MG/ML IJ SOLN: 0.0000 mg | Freq: Two times a day (BID) | INTRAMUSCULAR | Status: DC

## 2022-07-09 MED ORDER — HYDRALAZINE HCL 50 MG PO TABS
100.0000 mg | ORAL_TABLET | Freq: Three times a day (TID) | ORAL | Status: DC
  Administered 2022-07-09 – 2022-07-10 (×2): 100 mg via ORAL
  Filled 2022-07-09: qty 4
  Filled 2022-07-09: qty 2

## 2022-07-09 MED ORDER — ATORVASTATIN CALCIUM 10 MG PO TABS
20.0000 mg | ORAL_TABLET | Freq: Every day | ORAL | Status: DC
  Administered 2022-07-10 – 2022-07-13 (×4): 20 mg via ORAL
  Filled 2022-07-09 (×4): qty 2

## 2022-07-09 MED ORDER — FUROSEMIDE 10 MG/ML IJ SOLN
20.0000 mg | Freq: Once | INTRAMUSCULAR | Status: AC
Start: 2022-07-09 — End: 2022-07-09
  Administered 2022-07-09: 20 mg via INTRAVENOUS
  Filled 2022-07-09: qty 2

## 2022-07-09 MED ORDER — DEXTROSE IN LACTATED RINGERS 5 % IV SOLN: INTRAVENOUS | Status: DC

## 2022-07-09 MED ORDER — THIAMINE HCL 100 MG/ML IJ SOLN: 100.0000 mg | Freq: Every day | INTRAMUSCULAR | Status: DC

## 2022-07-09 MED ORDER — IOHEXOL 350 MG/ML SOLN
100.0000 mL | Freq: Once | INTRAVENOUS | Status: AC | PRN
  Administered 2022-07-09: 100 mL via INTRAVENOUS

## 2022-07-09 MED ORDER — LORAZEPAM 1 MG PO TABS
1.0000 mg | ORAL_TABLET | Freq: Once | ORAL | Status: AC
  Administered 2022-07-09: 1 mg via ORAL
  Filled 2022-07-09: qty 1

## 2022-07-09 MED ORDER — LORAZEPAM 2 MG/ML IJ SOLN
0.0000 mg | Freq: Four times a day (QID) | INTRAMUSCULAR | Status: DC
  Administered 2022-07-10: 2 mg via INTRAVENOUS
  Administered 2022-07-10: 1 mg via INTRAVENOUS
  Filled 2022-07-09 (×5): qty 1

## 2022-07-09 MED ORDER — FOLIC ACID 1 MG PO TABS
1.0000 mg | ORAL_TABLET | Freq: Every day | ORAL | Status: DC
  Administered 2022-07-10 – 2022-07-13 (×4): 1 mg via ORAL
  Filled 2022-07-09 (×4): qty 1

## 2022-07-09 MED ORDER — ACETAMINOPHEN 325 MG PO TABS: 650.0000 mg | ORAL_TABLET | Freq: Four times a day (QID) | ORAL | Status: DC | PRN

## 2022-07-09 MED ORDER — THIAMINE HCL 100 MG PO TABS
100.0000 mg | ORAL_TABLET | Freq: Every day | ORAL | Status: DC
  Administered 2022-07-10 – 2022-07-13 (×4): 100 mg via ORAL
  Filled 2022-07-09 (×4): qty 1

## 2022-07-09 MED ORDER — FUROSEMIDE 10 MG/ML IJ SOLN
40.0000 mg | Freq: Two times a day (BID) | INTRAMUSCULAR | Status: DC
  Administered 2022-07-10 (×2): 40 mg via INTRAVENOUS
  Filled 2022-07-09 (×2): qty 4

## 2022-07-09 MED ORDER — ENALAPRILAT 1.25 MG/ML IV SOLN
1.2500 mg | Freq: Four times a day (QID) | INTRAVENOUS | Status: DC | PRN
  Administered 2022-07-10 (×2): 1.25 mg via INTRAVENOUS
  Filled 2022-07-09 (×2): qty 1

## 2022-07-09 MED ORDER — LORAZEPAM 2 MG/ML IJ SOLN
1.0000 mg | INTRAMUSCULAR | Status: DC | PRN
  Administered 2022-07-10 (×3): 1 mg via INTRAVENOUS
  Administered 2022-07-10: 2 mg via INTRAVENOUS
  Administered 2022-07-10: 1 mg via INTRAVENOUS
  Filled 2022-07-09 (×3): qty 1

## 2022-07-09 MED ORDER — METHYLPREDNISOLONE SODIUM SUCC 125 MG IJ SOLR
80.0000 mg | INTRAMUSCULAR | Status: DC
  Administered 2022-07-10: 80 mg via INTRAVENOUS
  Filled 2022-07-09: qty 2

## 2022-07-09 MED ORDER — METHYLPREDNISOLONE SODIUM SUCC 125 MG IJ SOLR
125.0000 mg | Freq: Once | INTRAMUSCULAR | Status: AC
  Administered 2022-07-09: 125 mg via INTRAVENOUS
  Filled 2022-07-09: qty 2

## 2022-07-09 MED ORDER — CHLORDIAZEPOXIDE HCL 25 MG PO CAPS
25.0000 mg | ORAL_CAPSULE | Freq: Once | ORAL | Status: AC
  Administered 2022-07-09: 25 mg via ORAL
  Filled 2022-07-09: qty 1

## 2022-07-09 MED ORDER — LORAZEPAM 1 MG PO TABS
1.0000 mg | ORAL_TABLET | ORAL | Status: DC | PRN
  Administered 2022-07-10: 1 mg via ORAL
  Filled 2022-07-09: qty 1

## 2022-07-09 NOTE — Assessment & Plan Note (Signed)
·   Slightly elevated serial troponins with flat trajectory of elevation °· Patient is chest pain-free °· Likely secondary to underlying illness, plaque rupture is unlikely °· Monitoring patient on telemetry ° ° ° ° ° ° °

## 2022-07-09 NOTE — Assessment & Plan Note (Signed)
   Continue home regimen of CPAP nightly  If ABG reveals significant hypercapnia we will transition this to BiPAP

## 2022-07-09 NOTE — Assessment & Plan Note (Signed)
·   Please see assessment and plan above °

## 2022-07-09 NOTE — ED Provider Notes (Signed)
[facility] EMERGENCY DEPARTMENT Provider Note   CSN: [id number] Arrival date & time: 07/09/22  1507     History  Chief Complaint  Patient presents with   Shortness of Breath        Chest Pain    Jeremy Douglas is a 66 y.o. male with history of alcohol dependence, CKD, OSA, hypertension, PE on Eliquis, COPD who presents emergency department for evaluation shortness of breath that started yesterday.  He also endorses some left-sided chest pain that started approximately an hour or 2 ago and has been coming and going.  Describes pain as sharp.  Patient states that he gets swelling in his legs and takes 80 mg Lasix.  He denies known history of CHF although he he does state that [facility] and per chart review has had elevated BNP in the past.  He denies abdominal pain, nausea, vomiting and diarrhea.  Upon EMS arrival, patient was satting at 84% on room air and has been on 4 L [place] since.  Echocardiogram done 11/2018 with EF 55-60%.  Shortness of Breath Associated symptoms: chest pain   Associated symptoms: no abdominal pain, no fever and no vomiting   Chest Pain Associated symptoms: shortness of breath   Associated symptoms: no abdominal pain, no fever, no nausea and no vomiting        Home Medications Prior to Admission medications   Medication Sig Start Date End Date Taking? Authorizing Provider  atorvastatin (LIPITOR) 20 MG tablet Take 1 tablet (20 mg total) by mouth daily at 6 PM. 12/06/18 01/05/19  Amin, Loura Halt, MD  budesonide-formoterol (SYMBICORT) 160-4.5 MCG/ACT inhaler INHALE 2 PUFFS INTO THE LUNGS TWICE DAILY 08/20/19   Jetty Duhamel D, MD  cloNIDine (CATAPRES) 0.2 MG tablet Take 0.2 mg by mouth 2 (two) times daily.     [provider]  ELIQUIS 5 MG TABS tablet TAKE 1 TABLET BY MOUTH TWICE DAILY 12/06/20   Jetty Duhamel D, MD  ferrous sulfate 325 (65 FE) MG tablet Take 1 tablet (325 mg total) by mouth daily with  breakfast. 12/07/18 01/06/19  Amin, Loura Halt, MD  furosemide (LASIX) 40 MG tablet Take 40 mg by mouth daily.    [provider]  hydrALAZINE (APRESOLINE) 100 MG tablet Take 1 tablet (100 mg total) by mouth every 8 (eight) hours. 12/06/18 01/05/19  Amin, Loura Halt, MD  labetalol (NORMODYNE) 100 MG tablet Take 1 tablet (100 mg total) by mouth 2 (two) times daily. 12/06/18 01/05/19  Amin, Loura Halt, MD  Melatonin 5 MG TABS Take 5 mg by mouth as needed.    [provider]  Multiple Vitamin (MULITIVITAMIN WITH MINERALS) TABS Take 1 tablet by mouth daily.     [provider]  OVER THE COUNTER MEDICATION CPAP    [provider]  oxyCODONE-acetaminophen (PERCOCET) 10-325 MG tablet Take 1 tablet by mouth every 8 (eight) hours as needed for pain (knees).     [provider]  polyethylene glycol (MIRALAX / GLYCOLAX) packet Take 17 g by mouth daily as needed for moderate constipation or severe constipation. 12/06/18   Amin, Loura Halt, MD  telmisartan (MICARDIS) 40 MG tablet Take 40 mg by mouth at bedtime.  04/09/18   [provider]  tiZANidine (ZANAFLEX) 4 MG tablet Take 4 mg by mouth as needed. 11/25/18   [provider]      Allergies    Amlodipine    Review of Systems  Review of Systems  Constitutional:  Negative for fever.  Respiratory:  Positive for shortness of breath.   Cardiovascular:  Positive for chest pain.  Gastrointestinal:  Negative for abdominal pain, diarrhea, nausea and vomiting.    Physical Exam Updated Vital Signs BP (!) 196/154   Pulse (!) 56   Temp 98.6 F (37 C) (Temporal)   Resp (!) 21   Ht 5\' 10"  (1.778 m)   Wt 120.2 kg   SpO2 94%   BMI 38.02 kg/m  Physical Exam Vitals and nursing note reviewed.  Constitutional:      General: He is not in acute distress.    Appearance: He is ill-appearing.  HENT:     Head: Atraumatic.  Eyes:     Conjunctiva/sclera: Conjunctivae normal.  Neck:      Vascular: No JVD.  Cardiovascular:     Rate and Rhythm: Regular rhythm. Bradycardia present.     Pulses: Normal pulses.     Heart sounds: No murmur heard. Pulmonary:     Effort: No respiratory distress.     Breath sounds: Normal breath sounds.     Comments: Mildly increased respiratory effort, speaking in complete sentences Abdominal:     General: Abdomen is flat. There is no distension.     Palpations: Abdomen is soft.     Tenderness: There is no abdominal tenderness.  Musculoskeletal:        General: Normal range of motion.     Cervical back: Normal range of motion.     Right lower leg: No edema.     Left lower leg: No edema.  Skin:    General: Skin is warm and dry.     Capillary Refill: Capillary refill takes less than 2 seconds.     Comments: Chronic venous stasis changes on bilateral lower extremities  Neurological:     General: No focal deficit present.     Mental Status: He is alert.  Psychiatric:        Mood and Affect: Mood normal.     ED Results / Procedures / Treatments   Labs (all labs ordered are listed, but only abnormal results are displayed) Labs Reviewed  COMPREHENSIVE METABOLIC PANEL - Abnormal; Notable for the following components:      Result Value   CO2 16 (*)    Creatinine, Ser 1.32 (*)    Calcium 7.7 (*)    Albumin 3.0 (*)    AST 54 (*)    Alkaline Phosphatase 135 (*)    All other components within normal limits  BRAIN NATRIURETIC PEPTIDE - Abnormal; Notable for the following components:   B Natriuretic Peptide 144.5 (*)    All other components within normal limits  CBC WITH DIFFERENTIAL/PLATELET - Abnormal; Notable for the following components:   RBC 6.01 (*)    Hemoglobin 11.8 (*)    HCT 37.6 (*)    MCV 62.6 (*)    MCH 19.6 (*)    RDW 18.0 (*)    Platelets 138 (*)    All other components within normal limits  D-DIMER, QUANTITATIVE - Abnormal; Notable for the following components:   D-Dimer, Quant 2.82 (*)    All other components within  normal limits  TROPONIN I (HIGH SENSITIVITY) - Abnormal; Notable for the following components:   Troponin I (High Sensitivity) 40 (*)    All other components within normal limits  TROPONIN I (HIGH SENSITIVITY) - Abnormal; Notable for the following components:   Troponin I (High Sensitivity) 48 (*)  All other components within normal limits  SARS CORONAVIRUS 2 BY RT PCR  URINALYSIS, ROUTINE W REFLEX MICROSCOPIC  BLOOD GAS, ARTERIAL    EKG EKG Interpretation  Date/Time:  Monday July 09 2022 15:44:36 EDT Ventricular Rate:  50 PR Interval:  216 QRS Duration: 110 QT Interval:  528 QTC Calculation: 481 R Axis:   -13 Text Interpretation: Sinus bradycardia with 1st degree A-V block Septal infarct , age undetermined Abnormal ECG No previous ECGs available Confirmed by Gloris Manchesterixon, Ryan [phone]) on 07/09/2022 5:18:30 PM  Radiology CT Angio Chest PE W and/or Wo Contrast  Result Date: 07/09/2022 CLINICAL DATA:  Shortness of breath and chest pain EXAM: CT ANGIOGRAPHY CHEST WITH CONTRAST TECHNIQUE: Multidetector CT imaging of the chest was performed using the standard protocol during bolus administration of intravenous contrast. Multiplanar CT image reconstructions and MIPs were obtained to evaluate the vascular anatomy. RADIATION DOSE REDUCTION: This exam was performed according to the departmental dose-optimization program which includes automated exposure control, adjustment of the mA and/or kV according to patient size and/or use of iterative reconstruction technique. CONTRAST:  100mL OMNIPAQUE IOHEXOL 350 MG/ML SOLN COMPARISON:  Chest x-ray 07/09/2022, CT chest 12/03/2018 FINDINGS: Cardiovascular: Satisfactory opacification of the pulmonary arteries to the segmental level. No evidence of pulmonary embolism. Moderate aortic atherosclerosis. No aneurysm. Cardiomegaly. No pericardial effusion Mediastinum/Nodes: Midline trachea. No thyroid mass. Borderline mediastinal lymph nodes, AP window lymph node measures  11 mm. Lymphadenopathy has decreased since 2019 comparison. Esophagus within normal limits. Lungs/Pleura: Emphysema. No consolidation, pleural effusion, or pneumothorax Upper Abdomen: Probable hepatic steatosis.  No acute abnormality Musculoskeletal: No chest wall abnormality. No acute or significant osseous findings. Review of the MIP images confirms the above findings. IMPRESSION: 1. Negative for acute pulmonary embolus. 2. Emphysema without acute airspace disease 3. Cardiomegaly Aortic Atherosclerosis (ICD10-I70.0) and Emphysema (ICD10-J43.9). Electronically Signed   By: Jasmine PangKim  Fujinaga M.D.   On: 07/09/2022 21:40   DG Chest 2 View  Result Date: 07/09/2022 CLINICAL DATA:  Shortness of breath EXAM: CHEST - 2 VIEW COMPARISON:  12/04/2018 FINDINGS: Cardiomegaly with mild central congestion. No acute airspace disease, pleural effusion, or pneumothorax. Aortic atherosclerosis. IMPRESSION: Cardiomegaly with mild central congestion Electronically Signed   By: Jasmine PangKim  Fujinaga M.D.   On: 07/09/2022 16:25    Procedures Procedures    Medications Ordered in ED Medications  ipratropium-albuterol (DUONEB) 0.5-2.5 (3) MG/3ML nebulizer solution 3 mL (has no administration in time range)  dextrose 5 % in lactated ringers infusion (has no administration in time range)  losartan (COZAAR) tablet 50 mg (50 mg Oral Given 07/09/22 2250)  hydrALAZINE (APRESOLINE) tablet 100 mg (100 mg Oral Given 07/09/22 2250)  LORazepam (ATIVAN) tablet 1 mg (1 mg Oral Given 07/09/22 1952)  iohexol (OMNIPAQUE) 350 MG/ML injection 100 mL (100 mLs Intravenous Contrast Given 07/09/22 2129)  methylPREDNISolone sodium succinate (SOLU-MEDROL) 125 mg/2 mL injection 125 mg (125 mg Intravenous Given 07/09/22 2250)  furosemide (LASIX) injection 20 mg (20 mg Intravenous Given 07/09/22 2250)  chlordiazePOXIDE (LIBRIUM) capsule 25 mg (25 mg Oral Given 07/09/22 2250)    ED Course/ Medical Decision Making/ A&P                           Medical Decision  Making  Social determinants of health:  Social History   Socioeconomic History   Marital status: Divorced    Spouse name: Not on file   Number of children: Not on file   Years of education: Not  on file   Highest education level: Not on file  Occupational History   Occupation: Airline pilot REP    Employer: [employer]    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  Tobacco Use   Smoking status: Every Day    Packs/day: 0.50    Years: 41.00    Total pack years: 20.50    Types: Cigarettes   Smokeless tobacco: Never  Vaping Use   Vaping Use: Former   Substances: Nicotine   Devices: 12/03/2018 "3 jewels over the last 5 months"  Substance and Sexual Activity   Alcohol use: Not Currently    Alcohol/week: 1.0 standard drink of alcohol    Types: 1 Shots of liquor per week    Comment: 12/03/2018 h/o 1 Pint of vodka  2x a week ,04-26-18 last time I drank"   Drug use: Not Currently    Types: Marijuana    Comment: 12/03/2018 "nothing since the early 1980s"   Sexual activity: Not Currently  Other Topics Concern   Not on file  Social History Narrative   Not on file   Social Determinants of Health   Financial Resource Strain: Not on file  Food Insecurity: Not on file  Transportation Needs: Not on file  Physical Activity: Not on file  Stress: Not on file  Social Connections: Not on file  Intimate Partner Violence: Not on file     Initial impression:  This patient presents to the ED for concern of shortness of breath and chest pain, this involves an extensive number of treatment options, and is a complaint that carries with it a high risk of complications and morbidity.   Differentials include ACS, pneumonia, pneumothorax, PE, CHF, COPD exacerbation.   Comorbidities affecting care:  Per HPI  Additional history obtained: EMS  Lab Tests  I Ordered, reviewed, and interpreted labs and EKG.  The pertinent results include:  D dimer 2.82 CO2 16 BNP 144.5 Trops elevated at 40  and 48  Imaging Studies ordered:  I ordered imaging studies including  Chest x-ray shows cardiomegaly with mild chest congestion CTA with out evidence of PE, significant for emphysema and cardiomegaly I independently visualized and interpreted imaging and I agree with the radiologist interpretation.   EKG: Sinus bradycardia with first-degree block  Cardiac Monitoring:  The patient was maintained on a cardiac monitor.  I personally viewed and interpreted the cardiac monitored which showed an underlying rhythm of: Sinus bradycardia   Medicines ordered and prescription drug management:  I ordered medication including: Ativan 1 mg Librium DuoNeb Solu-Medrol 125 mg Dextrose 5% infusion Reevaluation of the patient after these medicines showed that the patient improved I have reviewed the patients home medicines and have made adjustments as needed   ED Course/Re-evaluation: Presents distress although nontoxic appearing.  Vitals notable for sinus bradycardia and hypertension of 193/60.  He was on 4 L nasal cannula during my evaluation and satting at 95% on room air.  Increased respiratory effort.  Lungs CTA bilaterally.  While the skin of his bilateral lower extremities have skin changes consistent with chronic venous stasis, there is no notable pitting edema.  CMP concerning for acidosis at 16.  No leukocytosis on CBC.  BNP is slightly elevated at 144.5.  His D-dimer was also elevated and CTA was obtained which was negative for PE.  Delta troponins are elevated although stable.  Patient has informed RN as well that he was about 6 years sober and relapsed a few days ago.  He was complaining of  bilateral hand tremors and anxiety so he was given 1 mg Ativan with some improvement in symptoms.   Overall, his work-up is most consistent with a COPD exacerbation so started on steroids, Lasix along with DuoNeb.  Given his likely alcohol drawl symptoms, we also ordered dextrose 5% and LR and Librium 25  mg.  His blood pressure has been consistently elevated here in the emergency department, so he was given his home medications of hydralazine 100 mg and losartan 50 mg.  I attempted to wean patient off of supplemental oxygen, however he desatted down to 87%.  He was placed back on 4 L nasal cannula.  I spoke with Dr. Leafy Half who agrees to admit patient for COPD exacerbation with new hypoxia.  Disposition:  After consideration of the diagnostic results, physical exam, history and the patients response to treatment feel that the patent would benefit from admission.   COPD exacerbation: Plan and management as described above. Discharged home in good condition.   Final Clinical Impression(s) / ED Diagnoses Final diagnoses:  COPD exacerbation [facility])    Rx / DC Orders ED Discharge Orders     None         Delight Ovens 07/09/22 2253    Gloris Manchester, MD 07/10/22 [phone]

## 2022-07-09 NOTE — H&P (Signed)
History and Physical    Patient: Jeremy Douglas MRN: [id number] DOA: 07/09/2022  Date of Service: the patient was seen and examined on 07/09/2022  Patient coming from: Home via EMS  Chief Complaint:  Chief Complaint  Patient presents with   Shortness of Breath        Chest Pain    HPI:   66 year old male with past medical history of COPD, diastolic congestive heart failure (Echo 11/2018 EF 55-60% with G2DD), pulmonary embolism on Eliquis, obstructive sleep apnea on CPAP, coronary artery disease, alcohol abuse, hypertension who presents to [facility] emergency department via EMS due to complaints of shortness of breath.  Patient explains that approximately 1 to 2 days ago the patient began to experience shortness of breath.  The shortness of breath is initially mild but progressively became more more severe.  Shortness breath is now worse with exertion and improved with rest.  Shortness breath is associated with cough and intermittent left-sided sharp chest pain.  Patient does complain of worsening bilateral lower extremity edema over the same span of time.  Patient denies fevers, sick contacts, recent travel or contact with confirmed COVID-19 infection.  Due to persisting symptoms, EMS was contacted who promptly came to evaluate the patient, found patient to be hypoxic in the 80s.  Patient was placed on 4 L nasal cannula and brought into [facility] emergency department for evaluation.  Upon evaluation in the emergency department patient was found to have an elevated D-dimer and therefore CT angiogram of the chest was performed which was negative for pulmonary embolism or pneumonia.  Clinically, ER provider felt patient was suffering from a COPD exacerbation.  Patient was treated with bronchodilator therapy, intravenous steroids as well as a dose of intravenous Lasix.  The hospitalist group was then called to assess the patient for admission to the hospital.  Review of  Systems: Review of Systems  Respiratory:  Positive for cough and shortness of breath.   Cardiovascular:  Positive for leg swelling.  All other systems reviewed and are negative.    Past Medical History:  Diagnosis Date   Alcohol dependence ([place]) 09/07/2012   Anemia    COPD (chronic obstructive pulmonary disease) (HCC)    Esophagitis 09/07/2012   Per EGD 04/2011   Gastritis 09/07/2012   Per EGD 04/2011   Headache    4 - 5 a yr (no migraines)   Heart murmur    Hypertension    takes Amlodipine,Labetalol,and Clonidine daily   Ischemic chest pain (HCC)    Obesity    OSA treated with BiPAP    Osteoarthritis    "knees" (12/03/2018)   Pulmonary emboli ([place]) 02/2018   Thrombocytopenia ([place]) 09/07/2012   Hx of in past.   Tobacco abuse     Past Surgical History:  Procedure Laterality Date   COLONOSCOPY W/ BIOPSIES AND POLYPECTOMY  2010   "it was negative" (12/03/2018)   JOINT REPLACEMENT     KNEE ARTHROSCOPY Left 09/18/2013   Procedure: ARTHROSCOPY KNEE, PARTIAL MEDIAL AND LATERAL MENISCECTOMY, CHONDROPLASTY PATELLA FEMORAL JOINT;  Surgeon: Alta Corning, MD;  Location: [place];  Service: Orthopedics;  Laterality: Left;   KNEE ARTHROSCOPY Right 06/20/2011   TONSILLECTOMY     as a child   TOTAL KNEE ARTHROPLASTY Left 03/19/2014   Procedure: LEFT TOTAL KNEE ARTHROPLASTY;  Surgeon: Alta Corning, MD;  Location: [place];  Service: Orthopedics;  Laterality: Left;   TOTAL KNEE ARTHROPLASTY Right 10/15/2014   Procedure:  RIGHT TOTAL KNEE ARTHROPLASTY;  Surgeon: Harvie Junior, MD;  Location: [facility] OR;  Service: Orthopedics;  Laterality: Right;   UPPER GI ENDOSCOPY      Social History:  reports that he has been smoking cigarettes. He has a 20.50 pack-year smoking history. He has never used smokeless tobacco. He reports that he does not currently use alcohol after a past usage of about 1.0 standard drink of alcohol per week. He reports that he does not currently use drugs after having  used the following drugs: Marijuana.  Allergies  Allergen Reactions   Amlodipine Swelling    Pt cannot tolerate 10mg      Family History  Problem Relation Age of Onset   CAD Father     Prior to Admission medications   Medication Sig Start Date End Date Taking? Authorizing Provider  atorvastatin (LIPITOR) 20 MG tablet Take 1 tablet (20 mg total) by mouth daily at 6 PM. 12/06/18 01/05/19  Amin, 01/07/19, MD  budesonide-formoterol (SYMBICORT) 160-4.5 MCG/ACT inhaler INHALE 2 PUFFS INTO THE LUNGS TWICE DAILY 08/20/19   08/22/19 D, MD  cloNIDine (CATAPRES) 0.2 MG tablet Take 0.2 mg by mouth 2 (two) times daily.     [provider]  ELIQUIS 5 MG TABS tablet TAKE 1 TABLET BY MOUTH TWICE DAILY 12/06/20   12/08/20 D, MD  ferrous sulfate 325 (65 FE) MG tablet Take 1 tablet (325 mg total) by mouth daily with breakfast. 12/07/18 01/06/19  Amin, 01/08/19, MD  furosemide (LASIX) 40 MG tablet Take 40 mg by mouth daily.    [provider]  hydrALAZINE (APRESOLINE) 100 MG tablet Take 1 tablet (100 mg total) by mouth every 8 (eight) hours. 12/06/18 01/05/19  Amin, 01/07/19, MD  labetalol (NORMODYNE) 100 MG tablet Take 1 tablet (100 mg total) by mouth 2 (two) times daily. 12/06/18 01/05/19  Amin, 01/07/19, MD  Melatonin 5 MG TABS Take 5 mg by mouth as needed.    [provider]  Multiple Vitamin (MULITIVITAMIN WITH MINERALS) TABS Take 1 tablet by mouth daily.     [provider]  OVER THE COUNTER MEDICATION CPAP    [provider]  oxyCODONE-acetaminophen (PERCOCET) 10-325 MG tablet Take 1 tablet by mouth every 8 (eight) hours as needed for pain (knees).     [provider]  polyethylene glycol (MIRALAX / GLYCOLAX) packet Take 17 g by mouth daily as needed for moderate constipation or severe constipation. 12/06/18   Amin, 12/08/18, MD  telmisartan (MICARDIS) 40 MG tablet Take 40 mg by mouth at bedtime.  04/09/18   [provider]  tiZANidine (ZANAFLEX) 4 MG tablet Take 4 mg by mouth as needed. 11/25/18   [provider]    Physical Exam:  Vitals:   07/09/22 2045 07/09/22 2308 07/09/22 2316 07/09/22 2330  BP: (!) 196/154 (!) 195/88  (!) 197/66  Pulse: (!) 56 63  65  Resp: (!) 21   19  Temp:   98.1 F (36.7 C)   TempSrc:   Oral   SpO2: 94% 93%  91%  Weight:      Height:        Constitutional: Lethargic but arousable, oriented x3, patient is in respiratory distress.  Patient is obese. Skin: no rashes, no lesions, good skin turgor noted. Eyes: Pupils are equally reactive to light.  No evidence of scleral icterus or conjunctival pallor.  ENMT: Moist mucous membranes noted.  Posterior pharynx clear of any exudate or  lesions.   Neck: normal, supple, no masses, no thyromegaly.  No evidence of jugular venous distension.   Respiratory: Bibasilar rales noted with intermittent expiratory wheezing.  Normal respiratory effort. No accessory muscle use.  Cardiovascular: Regular rate and rhythm, no murmurs / rubs / gallops. No extremity edema. 2+ pedal pulses. No carotid bruits.  Chest:   Nontender without crepitus or deformity.   Back:   Nontender without crepitus or deformity. Abdomen: Abdomen is extremely protuberant but soft and nontender.  No evidence of intra-abdominal masses.  Positive bowel sounds noted in all quadrants.   Musculoskeletal: No joint deformity upper and lower extremities. Good ROM, no contractures. Normal muscle tone.  Neurologic: Lethargic but arousable and oriented x3.  CN 2-12 grossly intact. Sensation intact.  Patient moving all 4 extremities spontaneously.  Patient is following all commands.  Patient is responsive to verbal stimuli.   Psychiatric: Unable to fully assess due to lethargy.  Patient does not seem to possess insight as to his current situation.  Data Reviewed:  I have personally reviewed and interpreted labs, imaging.  Significant findings are:  First  troponin 40, second troponin 48.   D-dimer 2.82.   CBC revealing white blood cell count of 8.4, hemoglobin 11.8, hematocrit 37.6, platelet count 138.   Chemistry revealing sodium 139, potassium 3.8, chloride 108, bicarbonate 16, BUN 21, creatinine 1.32. Chest x-ray personally reviewed revealing cardiomegaly as well as some degree of bilateral patchy infiltrates concerning for early pulmonary edema. CT angiogram of the chest pulmonary embolism protocol revealing no evidence of acute pulmonary embolism with emphysema and cardiomegaly with evidence of hepatic steatosis  EKG: Personally reviewed.  Rhythm is sinus bradycardia with heart rate of 50 bpm.  First-degree AV block.  No dynamic ST segment changes appreciated.   Assessment and Plan: * Acute respiratory failure with hypoxia (HCC) Patient suffering from acute hypoxic respiratory failure, likely multifactorial in origin Patient is likely suffering from acute diastolic congestive heart failure with superimposed COPD exacerbation Patient has been noted to be hypoxic with oxygen saturations in the 80s by EMS, currently on 4 L of oxygen via nasal cannula Obtaining stat ABG to determine if noninvasive positive pressure ventilation is necessary Obtaining COVID 19 testing Treating COPD aggressively with with bronchodilator therapy, systemic steroids Concurrently treating patient with intravenous Lasix due to concerns for concurrent acute diastolic congestive heart failure Titrating supplemental oxygen to maintain oxygen saturations of between 89 to 92% Clinical monitoring as patient is at high risk of rapid clinical decompensation  COPD exacerbation (HCC) Please see assessment and plan above  Acute on chronic diastolic CHF (congestive heart failure) (HCC) Some degree of congestive heart failure superimposed on COP exacerbation Monitoring renal function and electrolytes with serial chemistires Daily weights Monitoring patient on  telemetry Echocardiogram ordered for the morning Remainder of assessment and plan as above    Alcohol withdrawal with complication with inpatient treatment, with unspecified complication (HCC) Patient has longstanding history of alcohol abuse and has admitted to a recent relapse after being abstinent of alcohol for several years Concerns for impending alcohol withdrawal with increasing agitation and episode of hallucinations earlier in the emergency department stay Initiating CIWA protocol Patient will be administered scheduled tapering doses of benzodiazepines for evidence of withdrawal Additional benzodiazepines will be administered as needed for evidence of persistent withdrawal    Elevated troponin level not due myocardial infarction Slightly elevated serial troponins with flat trajectory of elevation Patient is chest pain-free Likely secondary to underlying illness, plaque rupture is  unlikely Monitoring patient on telemetry   Essential hypertension Resume patients home regimen of oral antihypertensives Titrate antihypertensive regimen as necessary to achieve adequate BP control PRN intravenous antihypertensives for excessively elevated blood pressure    History of pulmonary embolism Continue home regimen of Eliquis CT angiogram of the chest performed during this presentation due to elevated D-dimer which showed no evidence of recurrent pulmonary embolism  Mixed hyperlipidemia Continuing home regimen of lipid lowering therapy.   Obstructive sleep apnea Continue home regimen of CPAP nightly If ABG reveals significant hypercapnia we will transition this to BiPAP       Code Status:  Full code    Consults: none  Severity of Illness:  The appropriate patient status for this patient is OBSERVATION. Observation status is judged to be reasonable and necessary in order to provide the required intensity of service to ensure the patient's safety. The patient's presenting  symptoms, physical exam findings, and initial radiographic and laboratory data in the context of their medical condition is felt to place them at decreased risk for further clinical deterioration. Furthermore, it is anticipated that the patient will be medically stable for discharge from the hospital within 2 midnights of admission.   Author:  Vernelle Emerald MD  07/09/2022 11:58 PM

## 2022-07-09 NOTE — ED Triage Notes (Signed)
Pt bib [facility] from home for shortness of breath that started yesterday and chest pain that started approx an hour ago. Pt describes chest pain as sharp pain in L side that radiates to L arm. Hx of PE in 2019. EMS found pt to be at 84% on RA.   BP 170/110, HR 50, CBG 114, Spo2 98% 4L

## 2022-07-09 NOTE — Assessment & Plan Note (Signed)
.   Some degree of congestive heart failure superimposed on COP exacerbation . Monitoring renal function and electrolytes with serial chemistires . Daily weights . Monitoring patient on telemetry . Echocardiogram ordered for the morning . Remainder of assessment and plan as above

## 2022-07-09 NOTE — Assessment & Plan Note (Addendum)
   Continue home regimen of Eliquis  CT angiogram of the chest performed during this presentation due to elevated D-dimer which showed no evidence of recurrent pulmonary embolism

## 2022-07-09 NOTE — Assessment & Plan Note (Signed)
.   Continuing home regimen of lipid lowering therapy.  

## 2022-07-09 NOTE — Assessment & Plan Note (Addendum)
   Patient suffering from acute hypoxic respiratory failure, likely multifactorial in origin  Patient is likely suffering from acute diastolic congestive heart failure with superimposed COPD exacerbation  Patient has been noted to be hypoxic with oxygen saturations in the 80s by EMS, currently on 4 L of oxygen via nasal cannula  Obtaining stat ABG to determine if noninvasive positive pressure ventilation is necessary  Obtaining COVID 19 testing  Treating COPD aggressively with with bronchodilator therapy, systemic steroids  Concurrently treating patient with intravenous Lasix due to concerns for concurrent acute diastolic congestive heart failure  Titrating supplemental oxygen to maintain oxygen saturations of between 89 to 92%  Clinical monitoring as patient is at high risk of rapid clinical decompensation

## 2022-07-09 NOTE — Assessment & Plan Note (Signed)
.   Resume patients home regimen of oral antihypertensives . Titrate antihypertensive regimen as necessary to achieve adequate BP control . PRN intravenous antihypertensives for excessively elevated blood pressure   

## 2022-07-09 NOTE — Assessment & Plan Note (Signed)
.   Patient has longstanding history of alcohol abuse and has admitted to a recent relapse after being abstinent of alcohol for several years . Concerns for impending alcohol withdrawal with increasing agitation and episode of hallucinations earlier in the emergency department stay . Initiating CIWA protocol . Patient will be administered scheduled tapering doses of benzodiazepines for evidence of withdrawal . Additional benzodiazepines will be administered as needed for evidence of persistent withdrawal

## 2022-07-10 ENCOUNTER — Inpatient Hospital Stay (HOSPITAL_COMMUNITY): Payer: No Typology Code available for payment source

## 2022-07-10 ENCOUNTER — Observation Stay (HOSPITAL_COMMUNITY): Payer: No Typology Code available for payment source

## 2022-07-10 ENCOUNTER — Other Ambulatory Visit (HOSPITAL_COMMUNITY): Payer: No Typology Code available for payment source

## 2022-07-10 DIAGNOSIS — I5021 Acute systolic (congestive) heart failure: Secondary | ICD-10-CM | POA: Diagnosis not present

## 2022-07-10 DIAGNOSIS — M7989 Other specified soft tissue disorders: Secondary | ICD-10-CM

## 2022-07-10 DIAGNOSIS — I2510 Atherosclerotic heart disease of native coronary artery without angina pectoris: Secondary | ICD-10-CM | POA: Diagnosis present

## 2022-07-10 DIAGNOSIS — Z6838 Body mass index (BMI) 38.0-38.9, adult: Secondary | ICD-10-CM | POA: Diagnosis not present

## 2022-07-10 DIAGNOSIS — I5033 Acute on chronic diastolic (congestive) heart failure: Secondary | ICD-10-CM

## 2022-07-10 DIAGNOSIS — I161 Hypertensive emergency: Secondary | ICD-10-CM | POA: Diagnosis present

## 2022-07-10 DIAGNOSIS — I248 Other forms of acute ischemic heart disease: Secondary | ICD-10-CM | POA: Diagnosis present

## 2022-07-10 DIAGNOSIS — E782 Mixed hyperlipidemia: Secondary | ICD-10-CM | POA: Diagnosis present

## 2022-07-10 DIAGNOSIS — J9601 Acute respiratory failure with hypoxia: Secondary | ICD-10-CM | POA: Diagnosis present

## 2022-07-10 DIAGNOSIS — I130 Hypertensive heart and chronic kidney disease with heart failure and stage 1 through stage 4 chronic kidney disease, or unspecified chronic kidney disease: Secondary | ICD-10-CM | POA: Diagnosis present

## 2022-07-10 DIAGNOSIS — Z79899 Other long term (current) drug therapy: Secondary | ICD-10-CM | POA: Diagnosis not present

## 2022-07-10 DIAGNOSIS — Z96653 Presence of artificial knee joint, bilateral: Secondary | ICD-10-CM | POA: Diagnosis present

## 2022-07-10 DIAGNOSIS — F10939 Alcohol use, unspecified with withdrawal, unspecified: Secondary | ICD-10-CM | POA: Diagnosis not present

## 2022-07-10 DIAGNOSIS — F17210 Nicotine dependence, cigarettes, uncomplicated: Secondary | ICD-10-CM | POA: Diagnosis present

## 2022-07-10 DIAGNOSIS — Z7901 Long term (current) use of anticoagulants: Secondary | ICD-10-CM | POA: Diagnosis not present

## 2022-07-10 DIAGNOSIS — J449 Chronic obstructive pulmonary disease, unspecified: Secondary | ICD-10-CM | POA: Diagnosis present

## 2022-07-10 DIAGNOSIS — R7989 Other specified abnormal findings of blood chemistry: Secondary | ICD-10-CM | POA: Diagnosis not present

## 2022-07-10 DIAGNOSIS — Z91148 Patient's other noncompliance with medication regimen for other reason: Secondary | ICD-10-CM | POA: Diagnosis not present

## 2022-07-10 DIAGNOSIS — G4733 Obstructive sleep apnea (adult) (pediatric): Secondary | ICD-10-CM | POA: Diagnosis present

## 2022-07-10 DIAGNOSIS — Z7951 Long term (current) use of inhaled steroids: Secondary | ICD-10-CM | POA: Diagnosis not present

## 2022-07-10 DIAGNOSIS — J441 Chronic obstructive pulmonary disease with (acute) exacerbation: Secondary | ICD-10-CM | POA: Diagnosis present

## 2022-07-10 DIAGNOSIS — Z8249 Family history of ischemic heart disease and other diseases of the circulatory system: Secondary | ICD-10-CM | POA: Diagnosis not present

## 2022-07-10 DIAGNOSIS — Z86711 Personal history of pulmonary embolism: Secondary | ICD-10-CM | POA: Diagnosis not present

## 2022-07-10 DIAGNOSIS — F10239 Alcohol dependence with withdrawal, unspecified: Secondary | ICD-10-CM | POA: Diagnosis present

## 2022-07-10 DIAGNOSIS — Z888 Allergy status to other drugs, medicaments and biological substances status: Secondary | ICD-10-CM | POA: Diagnosis not present

## 2022-07-10 DIAGNOSIS — Z20822 Contact with and (suspected) exposure to covid-19: Secondary | ICD-10-CM | POA: Diagnosis present

## 2022-07-10 LAB — COMPREHENSIVE METABOLIC PANEL
ALT: 35 U/L (ref 0–44)
AST: 50 U/L — ABNORMAL HIGH (ref 15–41)
Albumin: 3.2 g/dL — ABNORMAL LOW (ref 3.5–5.0)
Alkaline Phosphatase: 147 U/L — ABNORMAL HIGH (ref 38–126)
Anion gap: 15 (ref 5–15)
BUN: 24 mg/dL — ABNORMAL HIGH (ref 8–23)
CO2: 19 mmol/L — ABNORMAL LOW (ref 22–32)
Calcium: 8.2 mg/dL — ABNORMAL LOW (ref 8.9–10.3)
Chloride: 105 mmol/L (ref 98–111)
Creatinine, Ser: 1.45 mg/dL — ABNORMAL HIGH (ref 0.61–1.24)
GFR, Estimated: 54 mL/min — ABNORMAL LOW (ref >60)
Glucose, Bld: 156 mg/dL — ABNORMAL HIGH (ref 70–99)
Potassium: 3.9 mmol/L (ref 3.5–5.1)
Sodium: 139 mmol/L (ref 135–145)
Total Bilirubin: 1.9 mg/dL — ABNORMAL HIGH (ref 0.3–1.2)
Total Protein: 7.2 g/dL (ref 6.5–8.1)

## 2022-07-10 LAB — CBC WITH DIFFERENTIAL/PLATELET
Abs Immature Granulocytes: 0.02 10*3/uL (ref 0.00–0.07)
Basophils Absolute: 0 10*3/uL (ref 0.0–0.1)
Basophils Relative: 0 %
Eosinophils Absolute: 0 10*3/uL (ref 0.0–0.5)
Eosinophils Relative: 0 %
HCT: 38.6 % — ABNORMAL LOW (ref 39.0–52.0)
Hemoglobin: 12.3 g/dL — ABNORMAL LOW (ref 13.0–17.0)
Immature Granulocytes: 0 %
Lymphocytes Relative: 7 %
Lymphs Abs: 0.6 10*3/uL — ABNORMAL LOW (ref 0.7–4.0)
MCH: 19.7 pg — ABNORMAL LOW (ref 26.0–34.0)
MCHC: 31.9 g/dL (ref 30.0–36.0)
MCV: 61.9 fL — ABNORMAL LOW (ref 80.0–100.0)
Monocytes Absolute: 0 10*3/uL — ABNORMAL LOW (ref 0.1–1.0)
Monocytes Relative: 1 %
Neutro Abs: 7.2 10*3/uL (ref 1.7–7.7)
Neutrophils Relative %: 92 %
Platelets: 130 10*3/uL — ABNORMAL LOW (ref 150–400)
RBC: 6.24 MIL/uL — ABNORMAL HIGH (ref 4.22–5.81)
RDW: 18 % — ABNORMAL HIGH (ref 11.5–15.5)
WBC: 7.9 10*3/uL (ref 4.0–10.5)
nRBC: 0 % (ref 0.0–0.2)

## 2022-07-10 LAB — ECHOCARDIOGRAM COMPLETE
AR max vel: 2.28 cm2
AV Area VTI: 2.49 cm2
AV Area mean vel: 2.36 cm2
AV Mean grad: 12.5 mmHg
AV Peak grad: 24 mmHg
Ao pk vel: 2.45 m/s
Area-P 1/2: 1.9 cm2
Height: 70 in
S' Lateral: 3.3 cm
Weight: 4240 oz

## 2022-07-10 LAB — I-STAT ARTERIAL BLOOD GAS, ED
Acid-base deficit: 2 mmol/L (ref 0.0–2.0)
Bicarbonate: 22.6 mmol/L (ref 20.0–28.0)
Calcium, Ion: 1.13 mmol/L — ABNORMAL LOW (ref 1.15–1.40)
HCT: 39 % (ref 39.0–52.0)
Hemoglobin: 13.3 g/dL (ref 13.0–17.0)
O2 Saturation: 98 %
Patient temperature: 98.8
Potassium: 4 mmol/L (ref 3.5–5.1)
Sodium: 137 mmol/L (ref 135–145)
TCO2: 24 mmol/L (ref 22–32)
pCO2 arterial: 37.3 mmHg (ref 32–48)
pH, Arterial: 7.39 (ref 7.35–7.45)
pO2, Arterial: 116 mmHg — ABNORMAL HIGH (ref 83–108)

## 2022-07-10 LAB — URINALYSIS, ROUTINE W REFLEX MICROSCOPIC
Bacteria, UA: NONE SEEN
Bilirubin Urine: NEGATIVE
Glucose, UA: NEGATIVE mg/dL
Ketones, ur: 5 mg/dL — AB
Leukocytes,Ua: NEGATIVE
Nitrite: NEGATIVE
Protein, ur: >=300 mg/dL — AB
Specific Gravity, Urine: 1.026 (ref 1.005–1.030)
pH: 5 (ref 5.0–8.0)

## 2022-07-10 LAB — RAPID URINE DRUG SCREEN, HOSP PERFORMED
Amphetamines: NOT DETECTED
Barbiturates: NOT DETECTED
Benzodiazepines: NOT DETECTED
Cocaine: NOT DETECTED
Opiates: NOT DETECTED
Tetrahydrocannabinol: NOT DETECTED

## 2022-07-10 LAB — GLUCOSE, CAPILLARY
Glucose-Capillary: 235 mg/dL — ABNORMAL HIGH (ref 70–99)
Glucose-Capillary: 207 mg/dL — ABNORMAL HIGH (ref 70–99)
Glucose-Capillary: 200 mg/dL — ABNORMAL HIGH (ref 70–99)
Glucose-Capillary: 209 mg/dL — ABNORMAL HIGH (ref 70–99)

## 2022-07-10 LAB — MRSA NEXT GEN BY PCR, NASAL: MRSA by PCR Next Gen: NOT DETECTED

## 2022-07-10 LAB — MAGNESIUM: Magnesium: 1.7 mg/dL (ref 1.7–2.4)

## 2022-07-10 LAB — HIV ANTIBODY (ROUTINE TESTING W REFLEX): HIV Screen 4th Generation wRfx: NONREACTIVE

## 2022-07-10 MED ORDER — SPIRONOLACTONE 25 MG PO TABS
25.0000 mg | ORAL_TABLET | Freq: Once | ORAL | Status: AC
  Administered 2022-07-10: 25 mg via ORAL
  Filled 2022-07-10: qty 1

## 2022-07-10 MED ORDER — HYDRALAZINE HCL 50 MG PO TABS
100.0000 mg | ORAL_TABLET | Freq: Three times a day (TID) | ORAL | Status: DC
  Administered 2022-07-10 – 2022-07-13 (×10): 100 mg via ORAL
  Filled 2022-07-10 (×10): qty 2

## 2022-07-10 MED ORDER — APIXABAN 5 MG PO TABS
ORAL_TABLET | ORAL | Status: AC
  Filled 2022-07-10: qty 1

## 2022-07-10 MED ORDER — CHLORHEXIDINE GLUCONATE CLOTH 2 % EX PADS
6.0000 | MEDICATED_PAD | Freq: Every day | CUTANEOUS | Status: DC
Start: 2022-07-10 — End: 2022-07-14
  Administered 2022-07-10 – 2022-07-12 (×3): 6 via TOPICAL

## 2022-07-10 MED ORDER — ORAL CARE MOUTH RINSE: 15.0000 mL | OROMUCOSAL | Status: DC | PRN

## 2022-07-10 MED ORDER — INSULIN ASPART 100 UNIT/ML IJ SOLN
0.0000 [IU] | Freq: Three times a day (TID) | INTRAMUSCULAR | Status: DC
  Administered 2022-07-10: 3 [IU] via SUBCUTANEOUS
  Administered 2022-07-11: 5 [IU] via SUBCUTANEOUS

## 2022-07-10 MED ORDER — INSULIN ASPART 100 UNIT/ML IJ SOLN: 0.0000 [IU] | Freq: Three times a day (TID) | INTRAMUSCULAR | Status: DC

## 2022-07-10 MED ORDER — FUROSEMIDE 10 MG/ML IJ SOLN
20.0000 mg | Freq: Once | INTRAMUSCULAR | Status: AC
  Administered 2022-07-10: 20 mg via INTRAVENOUS
  Filled 2022-07-10: qty 2

## 2022-07-10 MED ORDER — FLUTICASONE FUROATE-VILANTEROL 200-25 MCG/ACT IN AEPB
1.0000 | INHALATION_SPRAY | Freq: Every day | RESPIRATORY_TRACT | Status: DC
  Administered 2022-07-10 – 2022-07-13 (×4): 1 via RESPIRATORY_TRACT
  Filled 2022-07-10: qty 28

## 2022-07-10 MED ORDER — IPRATROPIUM-ALBUTEROL 0.5-2.5 (3) MG/3ML IN SOLN
RESPIRATORY_TRACT | Status: AC
  Filled 2022-07-10: qty 3

## 2022-07-10 MED ORDER — CLEVIDIPINE BUTYRATE 0.5 MG/ML IV EMUL
0.0000 mg/h | INTRAVENOUS | Status: DC
  Administered 2022-07-10: 1 mg/h via INTRAVENOUS
  Filled 2022-07-10: qty 100

## 2022-07-10 MED ORDER — DULOXETINE HCL 30 MG PO CPEP
60.0000 mg | ORAL_CAPSULE | Freq: Every day | ORAL | Status: DC
  Administered 2022-07-10 – 2022-07-13 (×4): 60 mg via ORAL
  Filled 2022-07-10 (×2): qty 2
  Filled 2022-07-10: qty 1
  Filled 2022-07-10: qty 2

## 2022-07-10 MED ORDER — CLEVIDIPINE BUTYRATE 0.5 MG/ML IV EMUL
0.0000 mg/h | INTRAVENOUS | Status: DC
  Administered 2022-07-10: 16 mg/h via INTRAVENOUS
  Administered 2022-07-10: 15 mg/h via INTRAVENOUS
  Administered 2022-07-11: 14 mg/h via INTRAVENOUS
  Filled 2022-07-10 (×6): qty 100

## 2022-07-10 MED ORDER — NICARDIPINE HCL IN NACL 20-0.86 MG/200ML-% IV SOLN
3.0000 mg/h | INTRAVENOUS | Status: DC
  Administered 2022-07-10: 5 mg/h via INTRAVENOUS
  Administered 2022-07-10: 7.5 mg/h via INTRAVENOUS
  Filled 2022-07-10 (×3): qty 200

## 2022-07-10 MED ORDER — CARVEDILOL 12.5 MG PO TABS
6.2500 mg | ORAL_TABLET | Freq: Two times a day (BID) | ORAL | Status: DC
  Administered 2022-07-10 – 2022-07-13 (×7): 6.25 mg via ORAL
  Filled 2022-07-10: qty 2
  Filled 2022-07-10 (×6): qty 1

## 2022-07-10 MED ORDER — FUROSEMIDE 10 MG/ML IJ SOLN
80.0000 mg | Freq: Two times a day (BID) | INTRAMUSCULAR | Status: DC
  Administered 2022-07-10 – 2022-07-12 (×4): 80 mg via INTRAVENOUS
  Filled 2022-07-10 (×4): qty 8

## 2022-07-10 MED ORDER — MAGNESIUM SULFATE 2 GM/50ML IV SOLN
2.0000 g | Freq: Once | INTRAVENOUS | Status: AC
  Administered 2022-07-10: 2 g via INTRAVENOUS
  Filled 2022-07-10: qty 50

## 2022-07-10 MED ORDER — CLONIDINE HCL 0.2 MG PO TABS
0.3000 mg | ORAL_TABLET | Freq: Three times a day (TID) | ORAL | Status: DC
  Administered 2022-07-10 – 2022-07-13 (×10): 0.3 mg via ORAL
  Filled 2022-07-10 (×10): qty 2

## 2022-07-10 MED ORDER — ORAL CARE MOUTH RINSE: 15.0000 mL | OROMUCOSAL | Status: DC

## 2022-07-10 MED ORDER — METHYLPREDNISOLONE SODIUM SUCC 40 MG IJ SOLR
40.0000 mg | INTRAMUSCULAR | Status: DC
Start: 2022-07-11 — End: 2022-07-11
  Administered 2022-07-11: 40 mg via INTRAVENOUS
  Filled 2022-07-10: qty 1

## 2022-07-10 MED ORDER — CLONIDINE HCL 0.2 MG PO TABS
0.2000 mg | ORAL_TABLET | Freq: Three times a day (TID) | ORAL | Status: DC
  Administered 2022-07-10: 0.2 mg via ORAL
  Filled 2022-07-10: qty 1

## 2022-07-10 MED ORDER — CLONIDINE HCL 0.2 MG PO TABS
0.2000 mg | ORAL_TABLET | Freq: Two times a day (BID) | ORAL | Status: DC
  Administered 2022-07-10: 0.2 mg via ORAL
  Filled 2022-07-10: qty 1

## 2022-07-10 NOTE — ED Notes (Signed)
Pt got up to use urinal without calling staff for assistance. Was found with urine on the floor. (Second time this shift). Pt reminded to call staff when requiring assistance. Clean gown provided. Pt transferred onto hospital bed. Bed alarm on to pt exiting bed. Call be within reach

## 2022-07-10 NOTE — Progress Notes (Signed)
Bilateral lower extremity venous duplex has been completed. Preliminary results can be found in CV Proc through chart review.   07/10/22 3:31 PM Olen Cordial RVT

## 2022-07-10 NOTE — Progress Notes (Signed)
PROGRESS NOTE                                                                                                                                                                                                             Patient Demographics:    Jeremy Douglas, is a 66 y.o. male, DOB - [birth year], EHM:[id number]  Outpatient Primary MD for the patient is [facility]    LOS - 0  Admit date - 07/09/2022    Chief Complaint  Patient presents with   Shortness of Breath        Chest Pain       Brief Narrative (HPI from H&P)  -66 year old male with history of COPD, diastolic congestive heart failure (Echo 11/2018 EF 55-60% with G2DD), pulmonary embolism on Eliquis, obstructive sleep apnea on CPAP, coronary artery disease, alcohol abuse, hypertension who presents to [facility] with chest pain, shortness of breath and extremely high blood pressure.  He is also been drinking a lot of alcohol for the last few days.  In the ER he was diagnosed with acute hypoxic respiratory failure due to CHF and COPD exacerbation along with hypertensive urgency and admitted to the hospital.   Subjective:    Jeremy Douglas today has, No headache, No chest pain, No abdominal pain - No Nausea, No new weakness tingling or numbness, mild SOB.   Assessment  & Plan :    Acute respiratory failure with hypoxia (HCC) -  Patient suffering from acute hypoxic respiratory failure, likely multifactorial in origin but large component of acute on chronic diastolic CHF last EF 60% in 2019.  Also has hypertensive urgency and some element of COPD exacerbation.  He has been placed on IV Lasix, continue Coreg, blood pressure medications have been titrated for better control, check echocardiogram, currently on 6 L nasal cannula oxygen continue, as needed BiPAP.  Monitor closely.  If there is further decline will transfer to ICU.  Mildly elevated troponin in  non-ACS pattern due to demand ischemia from CHF.   COPD exacerbation (HCC)  - Please see assessment and plan above, IV steroids.  Minimal wheezing  Acute on chronic diastolic CHF (congestive heart failure) (HCC) - as in #1 above.  Alcohol withdrawal with complication with inpatient treatment, with unspecified complication - continue CIWA protocol, counseled to quit alcohol  Essential hypertension  with hypertensive urgency.  Blood pressure medications have been adjusted, currently on combination of Coreg, Catapres, losartan, hydralazine, diuretics along with as needed IV medications.  Monitor and adjust.  History of pulmonary embolism  Continue home regimen of Eliquis, CTA negative obtain lower extremity venous duplex.  Mixed hyperlipidemia - Continuing home regimen of lipid lowering therapy.  Obstructive sleep apnea - Continue home regimen of CPAP nightly,    CKD 3B.  Baseline creatinine appears to be close to 1.6.  Monitor.       Condition - Extremely Guarded  Family Communication  :  None present  Code Status :  Full  Consults  :    PUD Prophylaxis :     Procedures  :     TTE -  Leg [place] -  CTA - 1. Negative for acute pulmonary embolus. 2. Emphysema without acute airspace disease 3. Cardiomegaly Aortic Atherosclerosis (ICD10-I70.0) and Emphysema (ICD10-J43.9)      Disposition Plan  :    Status is: Inpatient  DVT Prophylaxis  :    Place TED hose Start: 07/10/22 0900 apixaban (ELIQUIS) tablet 5 mg  apixaban (ELIQUIS) 5 MG tablet     Lab Results  Component Value Date   PLT 130 (L) 07/10/2022    Diet :  Diet Order             Diet Heart Room service appropriate? Yes; Fluid consistency: Thin  Diet effective now                    Inpatient Medications  Scheduled Meds:  apixaban       apixaban  5 mg Oral BID   atorvastatin  20 mg Oral q1800   carvedilol  6.25 mg Oral BID WC   cloNIDine  0.3 mg Oral TID   DULoxetine  60 mg Oral Daily    fluticasone furoate-vilanterol  1 puff Inhalation Daily   folic acid  1 mg Oral Daily   furosemide  20 mg Intravenous Once   furosemide  40 mg Intravenous BID   hydrALAZINE  100 mg Oral Q8H   ipratropium-albuterol  3 mL Nebulization Q6H   LORazepam  0-4 mg Intravenous Q6H   Followed by   Derrill Memo ON 07/12/2022] LORazepam  0-4 mg Intravenous Q12H   losartan  50 mg Oral Daily   [START ON 07/11/2022] methylPREDNISolone (SOLU-MEDROL) injection  40 mg Intravenous Q24H   multivitamin with minerals  1 tablet Oral Daily   thiamine  100 mg Oral Daily   Or   thiamine  100 mg Intravenous Daily   Continuous Infusions: PRN Meds:.acetaminophen **OR** acetaminophen, apixaban, enalaprilat, ipratropium-albuterol, LORazepam **OR** LORazepam, [DISCONTINUED] ondansetron **OR** ondansetron (ZOFRAN) IV, polyethylene glycol  Antibiotics  :    Anti-infectives (From admission, onward)    None        Time Spent in minutes  30   Lala Lund M.D on 07/10/2022 at 9:12 AM  To page go to [email or web address]   [facility] -  Office  [phone]  See all Orders from today for further details    Objective:   Vitals:   07/10/22 0658 07/10/22 0725 07/10/22 0825 07/10/22 0900  BP:  (!) 167/65 (!) 225/90 (!) 184/75  Pulse:  (!) 59  72  Resp: 20 18  (!) 22  Temp:  98.8 F (37.1 C)    TempSrc:  Axillary    SpO2: 91% 95%  (!) 88%  Weight:      Height:  Wt Readings from Last 3 Encounters:  07/09/22 120.2 kg  12/11/18 134.7 kg  12/06/18 134.8 kg     Intake/Output Summary (Last 24 hours) at 07/10/2022 0912 Last data filed at 07/10/2022 0629 Gross per 24 hour  Intake --  Output 850 ml  Net -850 ml     Physical Exam  Awake Alert, No new F.N deficits, Normal affect [place].AT,PERRAL Supple Neck, No JVD,   Symmetrical Chest wall movement, Good air movement bilaterally, ++ rales RRR,No Gallops,Rubs or new Murmurs,  +ve B.Sounds, Abd Soft, No tenderness,   No Cyanosis, 2+ edema    Data  Review:    CBC Recent Labs  Lab 07/09/22 1628 07/09/22 2321 07/10/22 0533  WBC 8.4  --  7.9  HGB 11.8* 13.9 12.3*  HCT 37.6* 41.0 38.6*  PLT 138*  --  130*  MCV 62.6*  --  61.9*  MCH 19.6*  --  19.7*  MCHC 31.4  --  31.9  RDW 18.0*  --  18.0*  LYMPHSABS 1.7  --  0.6*  MONOABS 0.6  --  0.0*  EOSABS 0.1  --  0.0  BASOSABS 0.1  --  0.0    Electrolytes Recent Labs  Lab 07/09/22 1628 07/09/22 2321 07/10/22 0533  NA 139 139 139  K 3.8 3.8 3.9  CL 108  --  105  CO2 16*  --  19*  GLUCOSE 94  --  156*  BUN 21  --  24*  CREATININE 1.32*  --  1.45*  CALCIUM 7.7*  --  8.2*  AST 54*  --  50*  ALT 33  --  35  ALKPHOS 135*  --  147*  BILITOT 1.2  --  1.9*  ALBUMIN 3.0*  --  3.2*  MG  --   --  1.7  DDIMER 2.82*  --   --   BNP 144.5*  --   --    ID Labs Recent Labs  Lab 07/09/22 1628 07/10/22 0533  WBC 8.4 7.9  PLT 138* 130*  DDIMER 2.82*  --   CREATININE 1.32* 1.45*    Radiology Reports DG Chest Port 1 View  Result Date: 07/10/2022 CLINICAL DATA:  Shortness of breath. Respiratory failure with hypoxia. EXAM: PORTABLE CHEST 1 VIEW COMPARISON:  07/09/2022 FINDINGS: Artifact from EKG leads. Generalized interstitial coarsening likely from the patient's emphysema. Borderline heart size is stable. There is no edema, consolidation, effusion, or pneumothorax. IMPRESSION: COPD without acute superimposed finding. Electronically Signed   By: Tiburcio Pea M.D.   On: 07/10/2022 07:17   US Abdomen Limited RUQ (LIVER/GB)  Result Date: 07/10/2022 CLINICAL DATA:  Cirrhosis. EXAM: ULTRASOUND ABDOMEN LIMITED RIGHT UPPER QUADRANT COMPARISON:  Several chest CTs dating back to 03/14/2018. FINDINGS: Gallbladder: No gallstones or wall thickening visualized. No sonographic Murphy sign noted by sonographer. Common bile duct: Diameter: 4 mm Liver: There is severe diffuse increased liver echogenicity most commonly seen in the setting of fatty infiltration. Superimposed inflammation or fibrosis  is not excluded. Clinical correlation is recommended. A 1.2 x 2.1 x 1.7 cm hypoechoic lesion in the left medial liver is not characterized but may represent a focus of fat spurring or an atypical hemangioma. No lesion was seen on the CTs. Further characterization with MRI or follow-up with ultrasound in 3 months recommended. Portal vein is patent on color Doppler imaging with normal direction of blood flow towards the liver. Other: None. IMPRESSION: 1. Severe fatty liver 2. Probable focus of fat spurring or atypical hemangioma in  the left lobe of the liver. Follow-up with ultrasound in 3 months or further characterization with MRI recommended. Electronically Signed   By: Elgie Collard M.D.   On: 07/10/2022 00:59   CT Angio Chest PE W and/or Wo Contrast  Result Date: 07/09/2022 CLINICAL DATA:  Shortness of breath and chest pain EXAM: CT ANGIOGRAPHY CHEST WITH CONTRAST TECHNIQUE: Multidetector CT imaging of the chest was performed using the standard protocol during bolus administration of intravenous contrast. Multiplanar CT image reconstructions and MIPs were obtained to evaluate the vascular anatomy. RADIATION DOSE REDUCTION: This exam was performed according to the departmental dose-optimization program which includes automated exposure control, adjustment of the mA and/or kV according to patient size and/or use of iterative reconstruction technique. CONTRAST:  OMNIPAQUE IOHEXOL 350 MG/ML SOLN COMPARISON:  Chest x-ray 07/09/2022, CT chest 12/03/2018 FINDINGS: Cardiovascular: Satisfactory opacification of the pulmonary arteries to the segmental level. No evidence of pulmonary embolism. Moderate aortic atherosclerosis. No aneurysm. Cardiomegaly. No pericardial effusion Mediastinum/Nodes: Midline trachea. No thyroid mass. Borderline mediastinal lymph nodes, AP window lymph node measures 11 mm. Lymphadenopathy has decreased since 2019 comparison. Esophagus within normal limits. Lungs/Pleura: Emphysema. No  consolidation, pleural effusion, or pneumothorax Upper Abdomen: Probable hepatic steatosis.  No acute abnormality Musculoskeletal: No chest wall abnormality. No acute or significant osseous findings. Review of the MIP images confirms the above findings. IMPRESSION: 1. Negative for acute pulmonary embolus. 2. Emphysema without acute airspace disease 3. Cardiomegaly Aortic Atherosclerosis (ICD10-I70.0) and Emphysema (ICD10-J43.9). Electronically Signed   By: Jasmine Pang M.D.   On: 07/09/2022 21:40   DG Chest 2 View  Result Date: 07/09/2022 CLINICAL DATA:  Shortness of breath EXAM: CHEST - 2 VIEW COMPARISON:  12/04/2018 FINDINGS: Cardiomegaly with mild central congestion. No acute airspace disease, pleural effusion, or pneumothorax. Aortic atherosclerosis. IMPRESSION: Cardiomegaly with mild central congestion Electronically Signed   By: Jasmine Pang M.D.   On: 07/09/2022 16:25

## 2022-07-10 NOTE — ED Notes (Signed)
Pt was brought back to room 38 by ED RN Delorise Shiner. Report at bedside. Pt on arrival pt on 4 L of oxygen, pending ABG, with HX COPD titrated O2 to 3 L. Pt denies chronic use of oxygen. Verbalized relief of CP with improvement of SOB. Pt breathing labored. Pt requested to sit on the edge of the bed. Placed non-grip socks on pt. Pt has urinal and call bell within reach. Requested pt call before standing. Pt verbalized understanding.

## 2022-07-10 NOTE — ED Notes (Signed)
Phlebotomy requested to collect morning labs. Unable to obtain from IVs

## 2022-07-10 NOTE — ED Notes (Signed)
Removed CPAP per pt request. Prior to CPAP pt was requiring 3 L of supplemental oxygen, not on chronic o2 with history of COPD. Upon removal pt 92-93% on room air, RR 18-19 without increased work of breathing. Pt SpO2 monitoring remains in place. Nasal cannula at bedside if needed.

## 2022-07-10 NOTE — ED Notes (Signed)
MD Thedore Mins made aware of sats on 4LPM [place]. Pt increased to 6LPM per MD request

## 2022-07-10 NOTE — ED Notes (Signed)
Requested PR vasotec from main pharmacy for BP management

## 2022-07-10 NOTE — ED Notes (Signed)
Discussed BP management with admitting [place]. Informed BP 219/954 despite giving PRN 1.25 mg Vasotec at 250. Pending new orders

## 2022-07-10 NOTE — Progress Notes (Signed)
  Echocardiogram 2D Echocardiogram has been performed.  Leta Jungling M 07/10/2022, 2:39 PM

## 2022-07-10 NOTE — ED Notes (Signed)
Pt found at edge of the bed. IV to L AC had been accidentally pulled out by pt. Blood noted down down pts arm, down both legs, and on hospital bed. Also noted urine on floor. Coban placed over pulled IV site. Clean grip socks and gown provided. New linen to bed. Note some small BM stains. Pt repositioned. Reminded that this is the 3rd incident pt has not requested staff. Reinforced importance of using call bell d/t monitoring system. Pt verbalized understanding. VS reconnect. Bed alarm on. Reconnected to CPAP.

## 2022-07-10 NOTE — ED Notes (Signed)
Breakfast order placed ?

## 2022-07-10 NOTE — Progress Notes (Signed)
PT Cancellation Note  Patient Details Name: Jeremy Douglas MRN: [id number] DOB: Apr 23, 1956   Cancelled Treatment:    Reason Eval/Treat Not Completed: Medical issues which prohibited therapy.  Per nsg, pt is too compromised to tolerate a PT evaluation.  Retry at another time.   Ivar Drape 07/10/2022, 10:35 AM  Samul Dada, PT PhD Acute Rehab Dept. Number: [facility] [id number] and [facility] [phone]

## 2022-07-10 NOTE — Progress Notes (Signed)
OT Cancellation Note  Patient Details Name: Jeremy Douglas MRN: [id number] DOB: 10/11/1956   Cancelled Treatment:    Reason Eval/Treat Not Completed: Medical issues which prohibited therapy (RN request hold due to recent trasnfer to ICU and planning to give ativan per CIWA protocal. Pt also on 100% O2 via NRB mask. Will return as schedule allows and pt medically ready.)  Claudetta Sallie M Jarielys Girardot Keionte Swicegood MSOT, OTR/L Acute Rehab Office: [phone] 07/10/2022, 2:09 PM

## 2022-07-10 NOTE — Progress Notes (Signed)
Heart Failure Navigator Progress Note  Following this hospitalization to assess for HV TOC readiness.   ECHO pending? Last EF 55-60% (01/2018) COPD/ ETOH w'd  Rhae Hammock, BSN, RN Heart Failure Nurse Navigator Secure Chat Only

## 2022-07-10 NOTE — ED Notes (Signed)
Pt found to have removed VS monitoring cords and CPAP. Pt informed importance to stay connected for monitoring. Reconnected VS. Attempted readjustment of CPAP. Pt more sleepy now after ativan. Requested pt lay down in bed d/t concerns for possible fall. Pt complaint. Pt refused schedule duoneb. RT called for adjustment of CPAP mask.

## 2022-07-10 NOTE — ED Notes (Signed)
PT continues to desat on 8LPM [place], sleeping soundly. Increase to 10LPM and called RT to replace pt on CPAP

## 2022-07-10 NOTE — ED Notes (Signed)
CPAP reapplied to pt, pt repositioned in bed. Sats now 97% on CPAP

## 2022-07-10 NOTE — ED Notes (Signed)
Pt's BP climbing despite admin of additional antihypertensives and lasix. No urine output since application of primofit this AM. Pt is lethargic and will not remain awake for longer than 1-2 minutes at a time and then drifts back off to deep sleep. CPAP in place for ventilation all morning, unable to maintain sats >85% consistently on 8-10LPM [place]. MD Thedore Mins paged and made aware. Repeat gas ordered, and RT made aware who will come obtain. Plan to potentially place pt on BIPAP based on gas results

## 2022-07-10 NOTE — ED Notes (Signed)
Pt de-stated to 88% on room air was placed on 2 L nasal cannula increased to 91-92%

## 2022-07-10 NOTE — Consult Note (Addendum)
NAME:  Jeremy Douglas, MRN:  [id number], DOB:  05-Jan-1956, LOS: 0 ADMISSION DATE:  07/09/2022, CONSULTATION DATE: 07/10/2022 REFERRING MD: Triad, CHIEF COMPLAINT: Refractory hypertension  History of Present Illness:  66 year old male with a plethora of health issues that are listed below who presents with several days of increasing shortness of breath.  He drinks a pint of liquor daily last dose was approximately 24 hours ago.  He has been treated with Lasix, multiple antihypertensives and improved refractory to treatment.  Pulmonary critical care was called to bedside for questionable intubation which she does not need at this time.  We will start him on a Cardene drip continue with diuresis transferred to intensive care unit and if needed will activate later today.  He is being treated for alcohol withdrawal.  He has a history of pulm embolism for which he remains on anticoag elation.  Pulmonary critical care will assume his care at this time.  Pertinent  Medical History   Past Medical History:  Diagnosis Date   Alcohol dependence (HCC) 09/07/2012   Anemia    COPD (chronic obstructive pulmonary disease) (HCC)    Esophagitis 09/07/2012   Per EGD 04/2011   Gastritis 09/07/2012   Per EGD 04/2011   Headache    4 - 5 a yr (no migraines)   Heart murmur    Hypertension    takes Amlodipine,Labetalol,and Clonidine daily   Ischemic chest pain (HCC)    Obesity    OSA treated with BiPAP    Osteoarthritis    "knees" (12/03/2018)   Pulmonary emboli (HCC) 02/2018   Thrombocytopenia (HCC) 09/07/2012   Hx of in past.   Tobacco abuse      Significant Hospital Events: Including procedures, antibiotic start and stop dates in addition to other pertinent events   07/10/2022 admit to the intensive care unit  Interim History / Subjective:  Morbidly obese COPD OSA volume overload hypertensive crisis be admitted to the intensive care unit  Objective   Blood pressure (!) 209/89, pulse (!) 59,  temperature 98.8 F (37.1 C), temperature source Axillary, resp. rate 18, height 5\' 10"  (1.778 m), weight 120.2 kg, SpO2 96 %.        Intake/Output Summary (Last 24 hours) at 07/10/2022 1124 Last data filed at 07/10/2022 07/12/2022 Gross per 24 hour  Intake --  Output 850 ml  Net -850 ml   Filed Weights   07/09/22 1511  Weight: 120.2 kg    Examination: General: Morbidly obese male who arouses and follows commands HENT: No JVD or lymphadenopathy is appreciated Lungs: Decreased air movement throughout Cardiovascular: Heart sounds are regular Abdomen: Large nondistended positive bowel sounds Extremities: Lower extremity with vascular stasis changes and edema Neuro: Lethargic but able to arouse and follow commands GU: Voids bladder scan requested  Resolved Hospital Problem list     Assessment & Plan:  Acute on chronic hypoxic respiratory failure complicated by morbid obesity, alcohol abuse, hypertension with noncompliance of medication. COPD Continue noninvasive mechanical ventilatory support O2 as needed to keep sats greater than 92% Transfer to intensive care unit for further evaluation and treatment He may need intubation in the near future if he does not respond to diuresis and noninvasive Steroids and bronchodilators  Hypertensive crisis currently refractory to current medications. Start Cardene drip Continue other antihypertensives Diuresis as tolerated Monitor to overreaction and hypotension. Bladder scan revealed 150 cc  Alcohol abuse 1 pint a day Standard alcohol withdrawal orders been placed Thiamine and folic acid  History  of pulmonary embolism Continue anticoagulation CT of the chest was negative for new PE  Morbid obesity Weight loss  Best Practice (right click and "Reselect all SmartList Selections" daily)   Diet/type: NPO DVT prophylaxis: DOAC GI prophylaxis: PPI Lines: One-time one-time dated Foley:  N/A Code Status:  full code Last date of  multidisciplinary goals of care discussion [tbd]  Labs   CBC: Recent Labs  Lab 07/09/22 1628 07/09/22 2321 07/10/22 0533 07/10/22 1115  WBC 8.4  --  7.9  --   NEUTROABS 6.0  --  7.2  --   HGB 11.8* 13.9 12.3* 13.3  HCT 37.6* 41.0 38.6* 39.0  MCV 62.6*  --  61.9*  --   PLT 138*  --  130*  --     Basic Metabolic Panel: Recent Labs  Lab 07/09/22 1628 07/09/22 2321 07/10/22 0533 07/10/22 1115  NA 139 139 139 137  K 3.8 3.8 3.9 4.0  CL 108  --  105  --   CO2 16*  --  19*  --   GLUCOSE 94  --  156*  --   BUN 21  --  24*  --   CREATININE 1.32*  --  1.45*  --   CALCIUM 7.7*  --  8.2*  --   MG  --   --  1.7  --    GFR: Estimated Creatinine Clearance: 66.9 mL/min (A) (by C-G formula based on SCr of 1.45 mg/dL (H)). Recent Labs  Lab 07/09/22 1628 07/10/22 0533  WBC 8.4 7.9    Liver Function Tests: Recent Labs  Lab 07/09/22 1628 07/10/22 0533  AST 54* 50*  ALT 33 35  ALKPHOS 135* 147*  BILITOT 1.2 1.9*  PROT 6.7 7.2  ALBUMIN 3.0* 3.2*   No results for input(s): "LIPASE", "AMYLASE" in the last 168 hours. No results for input(s): "AMMONIA" in the last 168 hours.  ABG    Component Value Date/Time   PHART 7.390 07/10/2022 1115   PCO2ART 37.3 07/10/2022 1115   PO2ART 116 (H) 07/10/2022 1115   HCO3 22.6 07/10/2022 1115   TCO2 24 07/10/2022 1115   ACIDBASEDEF 2.0 07/10/2022 1115   O2SAT 98 07/10/2022 1115     Coagulation Profile: No results for input(s): "INR", "PROTIME" in the last 168 hours.  Cardiac Enzymes: No results for input(s): "CKTOTAL", "CKMB", "CKMBINDEX", "TROPONINI" in the last 168 hours.  HbA1C: Hgb A1c MFr Bld  Date/Time Value Ref Range Status  03/14/2018 11:56 PM 5.1 4.8 - 5.6 % Final    Comment:    (NOTE) Pre diabetes:          5.7%-6.4% Diabetes:              >6.4% Glycemic control for   <7.0% adults with diabetes   04/28/2011 07:00 AM  <5.7 % Final   5.6 (NOTE)                                                                        According to the ADA Clinical Practice Recommendations for 2011, when HbA1c is used as a screening test:   >=6.5%   Diagnostic of Diabetes Mellitus           (if abnormal result  is  confirmed)  5.7-6.4%   Increased risk of developing Diabetes Mellitus  References:Diagnosis and Classification of Diabetes Mellitus,Diabetes Care,2011,34(Suppl 1):S62-S69 and Standards of Medical Care in         Diabetes - 2011,Diabetes Care,2011,34  (Suppl 1):S11-S61.    CBG: No results for input(s): "GLUCAP" in the last 168 hours.  Review of Systems:   Poor historian who reports drinking a pint of alcohol daily he has not had 1 in 24 hours.  Past Medical History:  He,  has a past medical history of Alcohol dependence (HCC) (09/07/2012), Anemia, COPD (chronic obstructive pulmonary disease) (HCC), Esophagitis (09/07/2012), Gastritis (09/07/2012), Headache, Heart murmur, Hypertension, Ischemic chest pain (HCC), Obesity, OSA treated with BiPAP, Osteoarthritis, Pulmonary emboli (HCC) (02/2018), Thrombocytopenia (HCC) (09/07/2012), and Tobacco abuse.   Surgical History:   Past Surgical History:  Procedure Laterality Date   COLONOSCOPY W/ BIOPSIES AND POLYPECTOMY  2010   "it was negative" (12/03/2018)   JOINT REPLACEMENT     KNEE ARTHROSCOPY Left 09/18/2013   Procedure: ARTHROSCOPY KNEE, PARTIAL MEDIAL AND LATERAL MENISCECTOMY, CHONDROPLASTY PATELLA FEMORAL JOINT;  Surgeon: Harvie Junior, MD;  Location: [facility];  Service: Orthopedics;  Laterality: Left;   KNEE ARTHROSCOPY Right 06/20/2011   TONSILLECTOMY     as a child   TOTAL KNEE ARTHROPLASTY Left 03/19/2014   Procedure: LEFT TOTAL KNEE ARTHROPLASTY;  Surgeon: Harvie Junior, MD;  Location: [facility] OR;  Service: Orthopedics;  Laterality: Left;   TOTAL KNEE ARTHROPLASTY Right 10/15/2014   Procedure: RIGHT TOTAL KNEE ARTHROPLASTY;  Surgeon: Harvie Junior, MD;  Location: [facility] OR;  Service: Orthopedics;  Laterality: Right;   UPPER GI ENDOSCOPY       Social  History:   reports that he has been smoking cigarettes. He has a 20.50 pack-year smoking history. He has never used smokeless tobacco. He reports that he does not currently use alcohol after a past usage of about 1.0 standard drink of alcohol per week. He reports that he does not currently use drugs after having used the following drugs: Marijuana.   Family History:  His family history includes CAD in his father.   Allergies Allergies  Allergen Reactions   Amlodipine Swelling    Pt cannot tolerate 10mg       Home Medications  Prior to Admission medications   Medication Sig Start Date End Date Taking? Authorizing Provider  atorvastatin (LIPITOR) 20 MG tablet Take 1 tablet (20 mg total) by mouth daily at 6 PM. Patient taking differently: Take 40 mg by mouth every evening. 12/06/18 07/11/23 Yes Amin, 07/13/23, MD  cloNIDine (CATAPRES) 0.2 MG tablet Take 0.2 mg by mouth 2 (two) times daily.    Yes [provider]  DULoxetine (CYMBALTA) 60 MG capsule Take 60 mg by mouth daily. for pain   Yes [provider]  ELIQUIS 5 MG TABS tablet TAKE 1 TABLET BY MOUTH TWICE DAILY Patient taking differently: Take 5 mg by mouth 2 (two) times daily. 12/06/20  Yes 12/08/20 D, MD  ferrous sulfate 325 (65 FE) MG tablet Take 1 tablet (325 mg total) by mouth daily with breakfast. Patient taking differently: Take 325 mg by mouth every Monday, Wednesday, and Friday. 12/07/18 07/11/23 Yes Amin, 07/13/23, MD  fluticasone-salmeterol (ADVAIR) 250-50 MCG/ACT AEPB Inhale 1 puff into the lungs daily as needed (for shortness of breath).   Yes [provider]  furosemide (LASIX) 40 MG tablet Take 80 mg by mouth 2 (two) times daily.   Yes [provider]  glimepiride (AMARYL) 2 MG tablet Take 2 mg by mouth daily with breakfast.   Yes [provider]  hydrALAZINE (APRESOLINE) 100 MG tablet Take 1 tablet (100 mg total) by mouth every 8 (eight) hours. Patient taking  differently: Take 100 mg by mouth 2 (two) times daily. 12/06/18 07/11/23 Yes Amin, Loura Halt, MD  labetalol (NORMODYNE) 100 MG tablet Take 1 tablet (100 mg total) by mouth 2 (two) times daily. Patient taking differently: Take 100 mg by mouth 3 (three) times daily. 12/06/18 07/11/23 Yes Amin, Loura Halt, MD  Melatonin 5 MG TABS Take 5 mg by mouth at bedtime as needed (for sleep).   Yes [provider]  Multiple Vitamin (MULITIVITAMIN WITH MINERALS) TABS Take 1 tablet by mouth daily.    Yes [provider]  oxyCODONE-acetaminophen (PERCOCET) 10-325 MG tablet Take 1 tablet by mouth every 8 (eight) hours as needed for pain (knees).    Yes [provider]  polyethylene glycol (MIRALAX / GLYCOLAX) packet Take 17 g by mouth daily as needed for moderate constipation or severe constipation. 12/06/18  Yes Amin, Ankit Chirag, MD  tiZANidine (ZANAFLEX) 4 MG tablet Take 4 mg by mouth at bedtime as needed for muscle spasms. 11/25/18  Yes [provider]  budesonide-formoterol (SYMBICORT) 160-4.5 MCG/ACT inhaler INHALE 2 PUFFS INTO THE LUNGS TWICE DAILY Patient not taking: Reported on 07/10/2022 08/20/19   Waymon Budge, MD  losartan (COZAAR) 50 MG tablet Take 25 mg by mouth daily. Patient not taking: Reported on 07/10/2022    [provider]  naloxone [facility]) nasal spray 4 mg/0.1 mL Place 1 spray into the nose once as needed. 03/05/22   [provider]  OVER THE COUNTER MEDICATION CPAP    [provider]     Critical care time: 34 min    Brett Canales Lynnetta Tom ACNP Acute Care Nurse Practitioner [facility] Pulmonary/Critical Care Please consult Amion 07/10/2022, 11:25 AM

## 2022-07-10 NOTE — Progress Notes (Signed)
Heart Failure Navigator Progress Note  Assessed for Heart & Vascular TOC clinic readiness.  Patient does not meet criteria due to not acute CHF current EF 60-65 %, admission COPD related. .   Navigator available for reassessment of patient.   Rhae Hammock, BSN, Scientist, clinical (histocompatibility and immunogenetics) Only

## 2022-07-10 NOTE — ED Notes (Signed)
Pt informed of MOA of lasix and its effects on his increased urine output. Pt connected to monitor and CPAP. Recommended use of male purfit or condom cath to assist with urination. Pt refused. Urinal at bedside. Pt refuses to allow staff to assist with urination.

## 2022-07-11 ENCOUNTER — Inpatient Hospital Stay (HOSPITAL_COMMUNITY): Payer: No Typology Code available for payment source

## 2022-07-11 DIAGNOSIS — J9601 Acute respiratory failure with hypoxia: Secondary | ICD-10-CM | POA: Diagnosis not present

## 2022-07-11 LAB — COMPREHENSIVE METABOLIC PANEL
ALT: 29 U/L (ref 0–44)
AST: 35 U/L (ref 15–41)
Albumin: 3 g/dL — ABNORMAL LOW (ref 3.5–5.0)
Alkaline Phosphatase: 117 U/L (ref 38–126)
Anion gap: 9 (ref 5–15)
BUN: 35 mg/dL — ABNORMAL HIGH (ref 8–23)
CO2: 23 mmol/L (ref 22–32)
Calcium: 8.6 mg/dL — ABNORMAL LOW (ref 8.9–10.3)
Chloride: 103 mmol/L (ref 98–111)
Creatinine, Ser: 1.46 mg/dL — ABNORMAL HIGH (ref 0.61–1.24)
GFR, Estimated: 53 mL/min — ABNORMAL LOW (ref >60)
Glucose, Bld: 183 mg/dL — ABNORMAL HIGH (ref 70–99)
Potassium: 3.6 mmol/L (ref 3.5–5.1)
Sodium: 135 mmol/L (ref 135–145)
Total Bilirubin: 1.2 mg/dL (ref 0.3–1.2)
Total Protein: 6.5 g/dL (ref 6.5–8.1)

## 2022-07-11 LAB — GLUCOSE, CAPILLARY
Glucose-Capillary: 211 mg/dL — ABNORMAL HIGH (ref 70–99)
Glucose-Capillary: 198 mg/dL — ABNORMAL HIGH (ref 70–99)
Glucose-Capillary: 227 mg/dL — ABNORMAL HIGH (ref 70–99)
Glucose-Capillary: 251 mg/dL — ABNORMAL HIGH (ref 70–99)

## 2022-07-11 LAB — CBC WITH DIFFERENTIAL/PLATELET
Abs Immature Granulocytes: 0 10*3/uL (ref 0.00–0.07)
Basophils Absolute: 0 10*3/uL (ref 0.0–0.1)
Basophils Relative: 0 %
Eosinophils Absolute: 0 10*3/uL (ref 0.0–0.5)
Eosinophils Relative: 0 %
HCT: 34.4 % — ABNORMAL LOW (ref 39.0–52.0)
Hemoglobin: 11.2 g/dL — ABNORMAL LOW (ref 13.0–17.0)
Lymphocytes Relative: 3 %
Lymphs Abs: 0.5 10*3/uL — ABNORMAL LOW (ref 0.7–4.0)
MCH: 19.8 pg — ABNORMAL LOW (ref 26.0–34.0)
MCHC: 32.6 g/dL (ref 30.0–36.0)
MCV: 60.8 fL — ABNORMAL LOW (ref 80.0–100.0)
Monocytes Absolute: 0 10*3/uL — ABNORMAL LOW (ref 0.1–1.0)
Monocytes Relative: 0 %
Neutro Abs: 15.7 10*3/uL — ABNORMAL HIGH (ref 1.7–7.7)
Neutrophils Relative %: 97 %
Platelets: 123 10*3/uL — ABNORMAL LOW (ref 150–400)
RBC: 5.66 MIL/uL (ref 4.22–5.81)
RDW: 17.9 % — ABNORMAL HIGH (ref 11.5–15.5)
WBC: 16.2 10*3/uL — ABNORMAL HIGH (ref 4.0–10.5)
nRBC: 0.2 % (ref 0.0–0.2)
nRBC: 1 /100 WBC — ABNORMAL HIGH

## 2022-07-11 LAB — BRAIN NATRIURETIC PEPTIDE: B Natriuretic Peptide: 210.5 pg/mL — ABNORMAL HIGH (ref 0.0–100.0)

## 2022-07-11 LAB — HEMOGLOBIN A1C
Hgb A1c MFr Bld: 5.5 % (ref 4.8–5.6)
Mean Plasma Glucose: 111.15 mg/dL

## 2022-07-11 LAB — TRIGLYCERIDES: Triglycerides: 102 mg/dL (ref <150)

## 2022-07-11 LAB — MAGNESIUM: Magnesium: 2.1 mg/dL (ref 1.7–2.4)

## 2022-07-11 MED ORDER — POTASSIUM CHLORIDE CRYS ER 20 MEQ PO TBCR
40.0000 meq | EXTENDED_RELEASE_TABLET | Freq: Two times a day (BID) | ORAL | Status: AC
  Administered 2022-07-11 (×2): 40 meq via ORAL
  Filled 2022-07-11 (×2): qty 2

## 2022-07-11 MED ORDER — TAMSULOSIN HCL 0.4 MG PO CAPS
0.4000 mg | ORAL_CAPSULE | Freq: Every day | ORAL | Status: DC
  Administered 2022-07-11 – 2022-07-13 (×3): 0.4 mg via ORAL
  Filled 2022-07-11 (×3): qty 1

## 2022-07-11 MED ORDER — OXYBUTYNIN CHLORIDE 5 MG PO TABS
5.0000 mg | ORAL_TABLET | Freq: Three times a day (TID) | ORAL | Status: DC
  Administered 2022-07-11 – 2022-07-13 (×7): 5 mg via ORAL
  Filled 2022-07-11 (×8): qty 1

## 2022-07-11 MED ORDER — PHENOBARBITAL 32.4 MG PO TABS: 32.4000 mg | ORAL_TABLET | Freq: Three times a day (TID) | ORAL | Status: DC

## 2022-07-11 MED ORDER — INSULIN ASPART 100 UNIT/ML IJ SOLN
0.0000 [IU] | Freq: Three times a day (TID) | INTRAMUSCULAR | Status: DC
  Administered 2022-07-11: 5 [IU] via SUBCUTANEOUS
  Administered 2022-07-11 – 2022-07-12 (×3): 3 [IU] via SUBCUTANEOUS
  Administered 2022-07-12: 5 [IU] via SUBCUTANEOUS
  Administered 2022-07-13: 3 [IU] via SUBCUTANEOUS
  Administered 2022-07-13 (×2): 2 [IU] via SUBCUTANEOUS

## 2022-07-11 MED ORDER — INSULIN ASPART 100 UNIT/ML IJ SOLN
0.0000 [IU] | Freq: Every day | INTRAMUSCULAR | Status: DC
  Administered 2022-07-11 – 2022-07-12 (×2): 3 [IU] via SUBCUTANEOUS

## 2022-07-11 MED ORDER — LORAZEPAM 2 MG/ML IJ SOLN
1.0000 mg | INTRAMUSCULAR | Status: DC | PRN
  Administered 2022-07-11 – 2022-07-12 (×3): 1 mg via INTRAVENOUS
  Filled 2022-07-11 (×3): qty 1

## 2022-07-11 MED ORDER — AMLODIPINE BESYLATE 10 MG PO TABS
10.0000 mg | ORAL_TABLET | Freq: Every day | ORAL | Status: DC
  Administered 2022-07-11 – 2022-07-13 (×2): 10 mg via ORAL
  Filled 2022-07-11 (×3): qty 1

## 2022-07-11 MED ORDER — PHENOBARBITAL 32.4 MG PO TABS
64.8000 mg | ORAL_TABLET | Freq: Three times a day (TID) | ORAL | Status: DC
  Administered 2022-07-13 (×2): 64.8 mg via ORAL
  Filled 2022-07-11 (×2): qty 2

## 2022-07-11 MED ORDER — PHENOBARBITAL 32.4 MG PO TABS
97.2000 mg | ORAL_TABLET | Freq: Three times a day (TID) | ORAL | Status: AC
  Administered 2022-07-11 – 2022-07-13 (×6): 97.2 mg via ORAL
  Filled 2022-07-11 (×6): qty 3

## 2022-07-11 NOTE — Evaluation (Signed)
Physical Therapy Evaluation Patient Details Name: Jeremy Douglas MRN: [id number] DOB: 17-Nov-1956 Today's Date: 07/11/2022  History of Present Illness  pt is a 66 y/o admited 7/17 to ED via EMS due to complaints of SOB.  Work up revealed Acute respiratory failure with hypoxia with COPD exacerbation and acute on chronic diastolic CHF.  PMHx: COPD, dCHF, PE, OSA on CPAP, CAD, alcohol abuse, HTN.  Clinical Impression  Pt admitted with/for respiratory failure as related above.  Pt not at baseline function, needing minimal assist at time and was unsteady during gait.Marland Kitchen  Pt currently limited functionally due to the problems listed below.  (see problems list.)  Pt will benefit from PT to maximize function and safety to be able to get home safely with available assist.        Recommendations for follow up therapy are one component of a multi-disciplinary discharge planning process, led by the attending physician.  Recommendations may be updated based on patient status, additional functional criteria and insurance authorization.  Follow Up Recommendations Home health PT      Assistance Recommended at Discharge Intermittent Supervision/Assistance  Patient can return home with the following  Assistance with cooking/housework;Assist for transportation;Help with stairs or ramp for entrance    Equipment Recommendations None recommended by PT  Recommendations for Other Services       Functional Status Assessment Patient has had a recent decline in their functional status and demonstrates the ability to make significant improvements in function in a reasonable and predictable amount of time.     Precautions / Restrictions Precautions Precautions: Fall      Mobility  Bed Mobility Overal bed mobility: Needs Assistance Bed Mobility: Supine to Sit, Sit to Supine     Supine to sit: Min guard Sit to supine: Min assist   General bed mobility comments: slow with some effort and HOB raised.     Transfers Overall transfer level: Needs assistance   Transfers: Sit to/from Stand Sit to Stand: Min assist           General transfer comment: steady assist once up.    Ambulation/Gait Ambulation/Gait assistance: Min assist Gait Distance (Feet): 120 Feet Assistive device: None Gait Pattern/deviations: Step-through pattern   Gait velocity interpretation: <1.31 ft/sec, indicative of household ambulator   General Gait Details: mildly unsteady, short, low amplitude to shuffled steps, slower cadence.  Stairs            Wheelchair Mobility    Modified Rankin (Stroke Patients Only)       Balance Overall balance assessment: Mild deficits observed, not formally tested                                           Pertinent Vitals/Pain Pain Assessment Pain Assessment: Faces Faces Pain Scale: Hurts even more Pain Location: penis Pain Descriptors / Indicators: Burning, Discomfort, Grimacing Pain Intervention(s): Monitored during session    Home Living   Living Arrangements: Children (lives with daugher and son in Social worker) Available Help at Discharge: Family;Available PRN/intermittently Type of Home: House Home Access: Level entry     Alternate Level Stairs-Number of Steps: 17 Home Layout: Multi-level;Other (Comment) (pt lives downstairs below main level) Home Equipment: Agricultural consultant (2 wheels);Tub bench      Prior Function Prior Level of Function : Independent/Modified Independent;Driving;Needs assist  Hand Dominance        Extremity/Trunk Assessment   Upper Extremity Assessment Upper Extremity Assessment: Defer to OT evaluation    Lower Extremity Assessment Lower Extremity Assessment: Overall WFL for tasks assessed;Generalized weakness    Cervical / Trunk Assessment Cervical / Trunk Assessment: Normal  Communication   Communication: No difficulties  Cognition Arousal/Alertness: Awake/alert Behavior  During Therapy: WFL for tasks assessed/performed Overall Cognitive Status: Within Functional Limits for tasks assessed (baseline)                                          General Comments General comments (skin integrity, edema, etc.): SpO2 on CPAP mid 90's, on 8L HFNC with activity, sats with suspect pleth 87-90%.  On 9L sitting in bed, sats 96%.  HR up to low 120's with activity.    Exercises     Assessment/Plan    PT Assessment Patient needs continued PT services  PT Problem List Decreased strength;Decreased activity tolerance;Decreased balance;Decreased mobility;Cardiopulmonary status limiting activity       PT Treatment Interventions DME instruction;Gait training;Functional mobility training;Stair training;Therapeutic activities;Balance training;Patient/family education    PT Goals (Current goals can be found in the Care Plan section)  Acute Rehab PT Goals Patient Stated Goal: home independent PT Goal Formulation: With patient Time For Goal Achievement: 07/25/22 Potential to Achieve Goals: Good    Frequency Min 3X/week     Co-evaluation               AM-PAC PT "6 Clicks" Mobility  Outcome Measure Help needed turning from your back to your side while in a flat bed without using bedrails?: A Little Help needed moving from lying on your back to sitting on the side of a flat bed without using bedrails?: A Little Help needed moving to and from a bed to a chair (including a wheelchair)?: A Little Help needed standing up from a chair using your arms (e.g., wheelchair or bedside chair)?: A Little Help needed to walk in hospital room?: A Little Help needed climbing 3-5 steps with a railing? : A Lot 6 Click Score: 17    End of Session Equipment Utilized During Treatment: Oxygen Activity Tolerance: Patient limited by fatigue;Patient tolerated treatment well Patient left: in bed;with call bell/phone within reach Nurse Communication: Mobility status PT  Visit Diagnosis: Unsteadiness on feet (R26.81);Muscle weakness (generalized) (M62.81);Difficulty in walking, not elsewhere classified (R26.2)    Time: 1610-9604 PT Time Calculation (min) (ACUTE ONLY): 33 min   Charges:   PT Evaluation $PT Eval Moderate Complexity: 1 Mod          07/11/2022  Jacinto Halim., PT Acute Rehabilitation Services [phone]  (pager) [phone]  (office)  Eliseo Gum Toshia Larkin 07/11/2022, 4:38 PM

## 2022-07-11 NOTE — Evaluation (Signed)
Occupational Therapy Evaluation Patient Details Name: Jeremy Douglas MRN: [id number] DOB: 1956-02-22 Today's Date: 07/11/2022   History of Present Illness pt is a 66 y/o admited 7/17 to ED via EMS due to complaints of SOB.  Work up revealed Acute respiratory failure with hypoxia with COPD exacerbation and acute on chronic diastolic CHF.  PMHx: COPD, dCHF, PE, OSA on CPAP, CAD, alcohol abuse, HTN.   Clinical Impression   Pt admitted for concerns listed above. PTA pt reported that he was independent with all ADL's and functional mobility. AT this time, pt presents with increased weakness, balance deficits, and decreased activity tolerance. He is requiring min A for ADL's and functional mobility at this time. Provided education on the importance of mobility at this time to improve endurance and strength. Recommending HHOT to maximize independence. OT will follow acutely.       Recommendations for follow up therapy are one component of a multi-disciplinary discharge planning process, led by the attending physician.  Recommendations may be updated based on patient status, additional functional criteria and insurance authorization.   Follow Up Recommendations  Home health OT    Assistance Recommended at Discharge Intermittent Supervision/Assistance  Patient can return home with the following A little help with walking and/or transfers;A little help with bathing/dressing/bathroom;Assistance with cooking/housework    Functional Status Assessment  Patient has had a recent decline in their functional status and demonstrates the ability to make significant improvements in function in a reasonable and predictable amount of time.  Equipment Recommendations  Other (comment) (TBD)    Recommendations for Other Services       Precautions / Restrictions Precautions Precautions: Fall Restrictions Weight Bearing Restrictions: No      Mobility Bed Mobility Overal bed mobility: Needs  Assistance Bed Mobility: Supine to Sit, Sit to Supine     Supine to sit: Min guard Sit to supine: Min assist   General bed mobility comments: slow with some effort and HOB raised.    Transfers Overall transfer level: Needs assistance   Transfers: Sit to/from Stand Sit to Stand: Min assist           General transfer comment: steady assist once up.      Balance Overall balance assessment: Mild deficits observed, not formally tested                                         ADL either performed or assessed with clinical judgement   ADL Overall ADL's : Needs assistance/impaired Eating/Feeding: Set up;Sitting   Grooming: Set up;Sitting   Upper Body Bathing: Min guard;Sitting   Lower Body Bathing: Minimal assistance;Sitting/lateral leans;Sit to/from stand   Upper Body Dressing : Min guard;Sitting   Lower Body Dressing: Minimal assistance;Sitting/lateral leans;Sit to/from stand   Toilet Transfer: Min guard;Ambulation   Toileting- Clothing Manipulation and Hygiene: Minimal assistance;Sitting/lateral lean;Sit to/from stand       Functional mobility during ADLs: Min guard       Vision Baseline Vision/History: 0 No visual deficits Ability to See in Adequate Light: 0 Adequate Patient Visual Report: No change from baseline Vision Assessment?: No apparent visual deficits     Perception     Praxis      Pertinent Vitals/Pain Pain Assessment Pain Assessment: Faces Faces Pain Scale: Hurts even more Pain Location: genitalia Pain Descriptors / Indicators: Burning, Discomfort, Grimacing Pain Intervention(s): Monitored during session  Hand Dominance Right   Extremity/Trunk Assessment Upper Extremity Assessment Upper Extremity Assessment: Generalized weakness   Lower Extremity Assessment Lower Extremity Assessment: Defer to PT evaluation   Cervical / Trunk Assessment Cervical / Trunk Assessment: Normal   Communication  Communication Communication: No difficulties   Cognition Arousal/Alertness: Awake/alert Behavior During Therapy: WFL for tasks assessed/performed Overall Cognitive Status: Within Functional Limits for tasks assessed (baseline)                                       General Comments  SpO2 on CPAP mid 90's, on 8L HFNC with activity, sats with suspect pleth 87-90%.  On 9L sitting in bed, sats 96%.  HR up to low 120's with activity.    Exercises     Shoulder Instructions      Home Living Family/patient expects to be discharged to:: Private residence Living Arrangements: Children (lives with daugher and son in [place]) Available Help at Discharge: Family;Available PRN/intermittently Type of Home: House Home Access: Level entry     Home Layout: Multi-level;Other (Comment) (pt lives downstairs below main level) Alternate Level Stairs-Number of Steps: 17 Alternate Level Stairs-Rails: Right Bathroom Shower/Tub: Chief Strategy Officer: Standard     Home Equipment: Agricultural consultant (2 wheels);Tub bench          Prior Functioning/Environment Prior Level of Function : Independent/Modified Independent;Driving                        OT Problem List: Decreased strength;Decreased activity tolerance;Impaired balance (sitting and/or standing);Decreased safety awareness;Cardiopulmonary status limiting activity      OT Treatment/Interventions: Self-care/ADL training;Therapeutic exercise;Energy conservation;DME and/or AE instruction;Therapeutic activities;Patient/family education;Balance training    OT Goals(Current goals can be found in the care plan section) Acute Rehab OT Goals Patient Stated Goal: To go home OT Goal Formulation: With patient Time For Goal Achievement: 07/25/22 Potential to Achieve Goals: Good ADL Goals Pt Will Perform Grooming: with modified independence;standing Pt Will Perform Lower Body Bathing: with modified  independence;sitting/lateral leans;sit to/from stand Pt Will Perform Lower Body Dressing: with modified independence;sitting/lateral leans;sit to/from stand Pt Will Transfer to Toilet: with modified independence;ambulating Pt Will Perform Toileting - Clothing Manipulation and hygiene: with modified independence;sitting/lateral leans;sit to/from stand  OT Frequency: Min 2X/week    Co-evaluation              AM-PAC OT "6 Clicks" Daily Activity     Outcome Measure Help from another person eating meals?: A Little Help from another person taking care of personal grooming?: A Little Help from another person toileting, which includes using toliet, bedpan, or urinal?: A Little Help from another person bathing (including washing, rinsing, drying)?: A Little Help from another person to put on and taking off regular upper body clothing?: A Little Help from another person to put on and taking off regular lower body clothing?: A Little 6 Click Score: 18   End of Session Equipment Utilized During Treatment: Oxygen Nurse Communication: Mobility status  Activity Tolerance: Patient tolerated treatment well Patient left: in bed;with call bell/phone within reach  OT Visit Diagnosis: Unsteadiness on feet (R26.81);Other abnormalities of gait and mobility (R26.89);Muscle weakness (generalized) (M62.81)                Time: 5277-8242 OT Time Calculation (min): 34 min Charges:  OT General Charges $OT Visit: 1 Visit OT Evaluation $OT Eval Moderate  Complexity: 1 Mod  Delorese Sellin H., OTR/L Acute Rehabilitation  Coni Homesley Elane Bing Plume 07/11/2022, 7:56 PM

## 2022-07-11 NOTE — Progress Notes (Signed)
Pt. Refused breathing treatment

## 2022-07-11 NOTE — Progress Notes (Signed)
Upon 0600 CIWA assessment, pt noted to have a score of 3. Ativan not given per order as score <5. Pt is asking if his Ativan orders have changed because he didn't get any this morning. Pt informed that it is given if certain parameters are met. Pt asks, "What symptoms? Like shaking, right?" Pt then states, "Well I would like to have some now." This RN reinforced that we administer it only when order parameters are met.

## 2022-07-11 NOTE — Progress Notes (Addendum)
Pharmacy Phenobarbital Consult Note   Pharmacy Consult for PO Phenobarbital  Indication: Alcohol Withdrawal Treatment  Labs:  Lab Results  Component Value Date   CREATININE 1.46 (H) 07/11/2022   AST 35 07/11/2022   ALT 29 07/11/2022   ALKPHOS 117 07/11/2022    Nursing Bedside Screening:   AUDIT-C:9 PAWSS: 6 Last known drink: 7/17 AUDIT-C by Meredith Staggers on 7/18 and PAWSS completed by Graylin Shiver on 7/19  Assessment: Jeremy Douglas is a 66 y.o. year old male admitted on 07/09/2022. Patients meets criteria for high risk dosing of phenobarbital for hx of DTs. Pharmacy consulted to stop CIWA ativan and start  phenobarbital taper per discussion with Dr. Katrinka Blazing.   Plan: Start high risk taper PO: Phenobarbital 97.2mg  oral q 8h x 6 doses followed by Phenobarbital 64.8mg  oral q 8h x 6 doses followed by Phenobarbital 32.4mg  oral q 8h x 6 doses for hx of DTs Start Lorazepam 1mg  IV q4h prn agitation     Thank you for allowing pharmacy to participate in this patient's care.  , PharmD, BCPS Clinical Pharmacist 07/11/2022 9:48 AM

## 2022-07-11 NOTE — Progress Notes (Signed)
PROGRESS NOTE                                                                                                                                                                                                             Patient Demographics:    Jeremy Douglas, is a 66 y.o. male, DOB - [birth year], [id number]  Outpatient Primary MD for the patient is [place] date - 07/09/2022    Chief Complaint  Patient presents with   Shortness of Breath        Chest Pain       Brief Narrative (HPI from H&P)  -66 year old male with history of COPD, diastolic congestive heart failure (Echo 11/2018 EF 55-60% with G2DD), pulmonary embolism on Eliquis, obstructive sleep apnea on CPAP, coronary artery disease, alcohol abuse, hypertension who presents to [facility] with chest pain, shortness of breath and extremely high blood pressure.  He is also been drinking a lot of alcohol for the last few days.  In the ER he was diagnosed with acute hypoxic respiratory failure due to CHF and COPD exacerbation along with hypertensive urgency and admitted to the hospital.   Subjective:   Patient in bed in ICU, sleeping but easily arousable denies any headache chest pain or shortness of breath.  Feels better than yesterday.   Assessment  & Plan :    Acute respiratory failure with hypoxia ([place]) -  Patient suffering from acute hypoxic respiratory failure, likely multifactorial in origin but large component of acute on chronic diastolic CHF last EF 123456 in 2019.  Also has hypertensive urgency and some element of COPD exacerbation.  He is currently in ICU however he is responding well to combination of IV Lasix along with Coreg, Norvasc and clevidipine drip for blood pressure control, blood pressure management per ICU team.  Once he is off of Cleviprex drip will take over his care .   COPD exacerbation ([place])  - Please see  assessment and plan above, IV steroids.  Minimal wheezing  Acute on chronic diastolic CHF (congestive heart failure) ([place]) - as in #1 above.  Alcohol withdrawal with complication with inpatient treatment, with unspecified complication - continue CIWA protocol, counseled to quit alcohol  Essential hypertension with hypertensive crisis.  Blood pressure medications have been adjusted, currently on  combination of Coreg, Catapres, losartan, hydralazine, diuretics along with clevidipine drip in ICU.  Titrate and monitor  History of pulmonary embolism  Continue home regimen of Eliquis, CTA and lower extremity venous duplex unremarkable.  Mixed hyperlipidemia - Continuing home regimen of lipid lowering therapy.  Obstructive sleep apnea - Continue home regimen of CPAP nightly,    CKD 3B.  Baseline creatinine appears to be close to 1.6.  Monitor.       Condition - Extremely Guarded  Family Communication  :  None present  Code Status :  Full  Consults  :    PUD Prophylaxis :     Procedures  :     TTE - 1. Left ventricular ejection fraction, by estimation, is 60 to 65%. The left ventricle has normal function. The left ventricle has no regional wall motion abnormalities. Left ventricular diastolic parameters are consistent with Grade II diastolic dysfunction (pseudonormalization).  2. Right ventricular systolic function is normal. The right ventricular size is normal. Tricuspid regurgitation signal is inadequate for assessing PA pressure.  3. Left atrial size was severely dilated.  4. Right atrial size was mildly dilated.  5. The mitral valve is grossly normal. No evidence of mitral valve regurgitation. Moderate mitral annular calcification.  6. The aortic valve is tricuspid. There is moderate calcification of the aortic valve. There is moderate thickening of the aortic valve. Aortic valve regurgitation is not visualized. Mild aortic valve stenosis.  7. The inferior vena cava is normal in size with  greater than 50% respiratory variability, suggesting right atrial pressure of 3 mmHg.  Leg [place] - No DVT  CTA - 1. Negative for acute pulmonary embolus. 2. Emphysema without acute airspace disease 3. Cardiomegaly Aortic Atherosclerosis (ICD10-I70.0) and Emphysema (ICD10-J43.9)      Disposition Plan  :    Status is: Inpatient  DVT Prophylaxis  :    Place and maintain sequential compression device Start: 07/10/22 1928 apixaban (ELIQUIS) tablet 5 mg     Lab Results  Component Value Date   PLT 123 (L) 07/11/2022    Diet :  Diet Order             Diet Heart Room service appropriate? Yes; Fluid consistency: Thin  Diet effective now                    Inpatient Medications  Scheduled Meds:  amLODipine  10 mg Oral Daily   apixaban  5 mg Oral BID   atorvastatin  20 mg Oral q1800   carvedilol  6.25 mg Oral BID WC   Chlorhexidine Gluconate Cloth  6 each Topical Daily   cloNIDine  0.3 mg Oral TID   DULoxetine  60 mg Oral Daily   fluticasone furoate-vilanterol  1 puff Inhalation Daily   folic acid  1 mg Oral Daily   furosemide  80 mg Intravenous BID   hydrALAZINE  100 mg Oral Q8H   insulin aspart  0-15 Units Subcutaneous TID WC   insulin aspart  0-5 Units Subcutaneous QHS   losartan  50 mg Oral Daily   multivitamin with minerals  1 tablet Oral Daily   phenobarbital  97.2 mg Oral Q8H   Followed by   Derrill Memo ON 07/13/2022] phenobarbital  64.8 mg Oral Q8H   Followed by   Derrill Memo ON 07/15/2022] phenobarbital  32.4 mg Oral Q8H   potassium chloride  40 mEq Oral BID   tamsulosin  0.4 mg Oral Daily   thiamine  100  mg Oral Daily   Or   thiamine  100 mg Intravenous Daily   Continuous Infusions:  clevidipine Stopped (07/11/22 1123)   PRN Meds:.acetaminophen **OR** acetaminophen, enalaprilat, ipratropium-albuterol, LORazepam, [DISCONTINUED] ondansetron **OR** ondansetron (ZOFRAN) IV, mouth rinse, polyethylene glycol  Antibiotics  :    Anti-infectives (From admission, onward)     None        Time Spent in minutes  30   Susa Raring M.D on 07/11/2022 at 12:03 PM  To page go to [email or web address]   [facility] -  Office  [phone]  See all Orders from today for further details    Objective:   Vitals:   07/11/22 0930 07/11/22 0945 07/11/22 1000 07/11/22 1122  BP: (!) 127/50 (!) 129/49 106/80   Pulse: (!) 58 64 72   Resp: 19 (!) 28 19   Temp:    98 F (36.7 C)  TempSrc:    Oral  SpO2: 92% (!) 89% 92%   Weight:      Height:        Wt Readings from Last 3 Encounters:  07/10/22 128.4 kg  12/11/18 134.7 kg  12/06/18 134.8 kg     Intake/Output Summary (Last 24 hours) at 07/11/2022 1203 Last data filed at 07/11/2022 1126 Gross per 24 hour  Intake 1276.35 ml  Output 1675 ml  Net -398.65 ml     Physical Exam  Awake  No new F.N deficits, Normal affect [place].AT,PERRAL Supple Neck, No JVD,   Symmetrical Chest wall movement, moderate bilateral air movement with bibasilar Rales RRR,No Gallops, Rubs or new Murmurs,  +ve B.Sounds, Abd Soft, No tenderness,   1+ leg edema    Data Review:    CBC Recent Labs  Lab 07/09/22 1628 07/09/22 2321 07/10/22 0533 07/10/22 1115 07/11/22 0636  WBC 8.4  --  7.9  --  16.2*  HGB 11.8* 13.9 12.3* 13.3 11.2*  HCT 37.6* 41.0 38.6* 39.0 34.4*  PLT 138*  --  130*  --  123*  MCV 62.6*  --  61.9*  --  60.8*  MCH 19.6*  --  19.7*  --  19.8*  MCHC 31.4  --  31.9  --  32.6  RDW 18.0*  --  18.0*  --  17.9*  LYMPHSABS 1.7  --  0.6*  --  0.5*  MONOABS 0.6  --  0.0*  --  0.0*  EOSABS 0.1  --  0.0  --  0.0  BASOSABS 0.1  --  0.0  --  0.0    Electrolytes Recent Labs  Lab 07/09/22 1628 07/09/22 2321 07/10/22 0533 07/10/22 1115 07/11/22 0636  NA 139 139 139 137 135  K 3.8 3.8 3.9 4.0 3.6  CL 108  --  105  --  103  CO2 16*  --  19*  --  23  GLUCOSE 94  --  156*  --  183*  BUN 21  --  24*  --  35*  CREATININE 1.32*  --  1.45*  --  1.46*  CALCIUM 7.7*  --  8.2*  --  8.6*  AST 54*  --  50*  --   35  ALT 33  --  35  --  29  ALKPHOS 135*  --  147*  --  117  BILITOT 1.2  --  1.9*  --  1.2  ALBUMIN 3.0*  --  3.2*  --  3.0*  MG  --   --  1.7  --  2.1  DDIMER  2.82*  --   --   --   --   HGBA1C  --   --   --   --  5.5  BNP 144.5*  --   --   --  210.5*   ID Labs Recent Labs  Lab 07/09/22 1628 07/10/22 0533 07/11/22 0636  WBC 8.4 7.9 16.2*  PLT 138* 130* 123*  DDIMER 2.82*  --   --   CREATININE 1.32* 1.45* 1.46*    Radiology Reports DG Chest Port 1 View  Result Date: 07/11/2022 CLINICAL DATA:  Provided history: Shortness of breath and chest pain. History of respiratory failure. EXAM: PORTABLE CHEST 1 VIEW COMPARISON:  Prior chest radiographs 07/10/2022 and earlier. CT angiogram chest 07/09/2022. FINDINGS: Heart size at the upper limits of normal. Aortic atherosclerosis. Emphysema, better appreciated on the recent prior chest CT of 07/09/2022. Chronic prominence of the interstitial lung markings. Minimal ill-defined opacity within the left lung base. No evidence of pleural effusion or pneumothorax. No acute bony abnormality identified. Degenerative changes of the spine. IMPRESSION: Minimal ill-defined opacity within the left lung base, with an appearance favoring atelectasis. Aortic Atherosclerosis (ICD10-I70.0) and Emphysema (ICD10-J43.9). Electronically Signed   By: Kellie Simmering D.O.   On: 07/11/2022 07:21   VAS [place] LOWER EXTREMITY VENOUS (DVT)  Result Date: 07/10/2022  Lower Venous DVT Study Patient Name:  EMAN STEGEMAN  Date of Exam:   07/10/2022 Medical Rec #: [id number]         Accession #:    [id number] Date of Birth: [birth year]         Patient Gender: M Patient Age:   74 years Exam Location[facility]  [facility] Procedure:      VAS [place] LOWER EXTREMITY VENOUS (DVT) Referring Phys: Deno Etienne [facility] --------------------------------------------------------------------------------  Indications: Swelling, and Elevated Ddimer.  Risk Factors: None identified. Limitations: Patient positioning  and poor ultrasound/tissue interface. Comparison Study: No prior studies. Performing Technologist: Oliver Hum RVT  Examination Guidelines: A complete evaluation includes B-mode imaging, spectral Doppler, color Doppler, and power Doppler as needed of all accessible portions of each vessel. Bilateral testing is considered an integral part of a complete examination. Limited examinations for reoccurring indications may be performed as noted. The reflux portion of the exam is performed with the patient in reverse Trendelenburg.  +---------+---------------+---------+-----------+----------+--------------+ RIGHT    CompressibilityPhasicitySpontaneityPropertiesThrombus Aging +---------+---------------+---------+-----------+----------+--------------+ CFV      Full           Yes      Yes                                 +---------+---------------+---------+-----------+----------+--------------+ SFJ      Full                                                        +---------+---------------+---------+-----------+----------+--------------+ FV Prox  Full                                                        +---------+---------------+---------+-----------+----------+--------------+ FV Mid   Full                                                        +---------+---------------+---------+-----------+----------+--------------+  FV DistalFull                                                        +---------+---------------+---------+-----------+----------+--------------+ PFV      Full                                                        +---------+---------------+---------+-----------+----------+--------------+ POP      Full           Yes      Yes                                 +---------+---------------+---------+-----------+----------+--------------+ PTV      Full                                                         +---------+---------------+---------+-----------+----------+--------------+ PERO     Full                                                        +---------+---------------+---------+-----------+----------+--------------+   +---------+---------------+---------+-----------+----------+--------------+ LEFT     CompressibilityPhasicitySpontaneityPropertiesThrombus Aging +---------+---------------+---------+-----------+----------+--------------+ CFV      Full           Yes      Yes                                 +---------+---------------+---------+-----------+----------+--------------+ SFJ      Full                                                        +---------+---------------+---------+-----------+----------+--------------+ FV Prox  Full                                                        +---------+---------------+---------+-----------+----------+--------------+ FV Mid   Full                                                        +---------+---------------+---------+-----------+----------+--------------+ FV Distal               Yes      Yes                                 +---------+---------------+---------+-----------+----------+--------------+  PFV      Full                                                        +---------+---------------+---------+-----------+----------+--------------+ POP      Full           Yes      Yes                                 +---------+---------------+---------+-----------+----------+--------------+ PTV      Full                                                        +---------+---------------+---------+-----------+----------+--------------+ PERO     Full                                                        +---------+---------------+---------+-----------+----------+--------------+     Summary: RIGHT: - There is no evidence of deep vein thrombosis in the lower extremity. However, portions of this  examination were limited- see technologist comments above.  - No cystic structure found in the popliteal fossa.  LEFT: - There is no evidence of deep vein thrombosis in the lower extremity. However, portions of this examination were limited- see technologist comments above.  - No cystic structure found in the popliteal fossa.  *See table(s) above for measurements and observations. Electronically signed by Deitra Mayo MD on 07/10/2022 at 6:30:05 PM.    Final    ECHOCARDIOGRAM COMPLETE  Result Date: 07/10/2022    ECHOCARDIOGRAM REPORT   Patient Name:   YI GUILMETTE Date of Exam: 07/10/2022 Medical Rec #:  [id number]        Height:       70.0 in Accession #:    [id number]       Weight:       265.0 lb Date of Birth:  1956/03/03        BSA:          2.353 m Patient Age:    57 years         BP:           162/65 mmHg Patient Gender: M                HR:           69 bpm. Exam Location:  Inpatient Procedure: 2D Echo, Cardiac Doppler and Color Doppler Indications:    CHF-Acute Systolic [id number]  History:        Patient has prior history of Echocardiogram examinations, most                 recent 12/03/2018. COPD exacerbation; Risk Factors:Sleep Apnea,                 Hypertension and Dyslipidemia. History of pulmonary embolism.                 Chronic kidney disease.  Sonographer:  Leta Jungling RDCS Referring Phys: Deno Lunger [facility] IMPRESSIONS  1. Left ventricular ejection fraction, by estimation, is 60 to 65%. The left ventricle has normal function. The left ventricle has no regional wall motion abnormalities. Left ventricular diastolic parameters are consistent with Grade II diastolic dysfunction (pseudonormalization).  2. Right ventricular systolic function is normal. The right ventricular size is normal. Tricuspid regurgitation signal is inadequate for assessing PA pressure.  3. Left atrial size was severely dilated.  4. Right atrial size was mildly dilated.  5. The mitral valve is grossly normal. No  evidence of mitral valve regurgitation. Moderate mitral annular calcification.  6. The aortic valve is tricuspid. There is moderate calcification of the aortic valve. There is moderate thickening of the aortic valve. Aortic valve regurgitation is not visualized. Mild aortic valve stenosis.  7. The inferior vena cava is normal in size with greater than 50% respiratory variability, suggesting right atrial pressure of 3 mmHg. FINDINGS  Left Ventricle: Left ventricular ejection fraction, by estimation, is 60 to 65%. The left ventricle has normal function. The left ventricle has no regional wall motion abnormalities. The left ventricular internal cavity size was normal in size. There is  no left ventricular hypertrophy. Left ventricular diastolic parameters are consistent with Grade II diastolic dysfunction (pseudonormalization). Right Ventricle: The right ventricular size is normal. No increase in right ventricular wall thickness. Right ventricular systolic function is normal. Tricuspid regurgitation signal is inadequate for assessing PA pressure. Left Atrium: Left atrial size was severely dilated. Right Atrium: Right atrial size was mildly dilated. Pericardium: There is no evidence of pericardial effusion. Mitral Valve: The mitral valve is grossly normal. Moderate mitral annular calcification. No evidence of mitral valve regurgitation. Tricuspid Valve: The tricuspid valve is grossly normal. Tricuspid valve regurgitation is not demonstrated. No evidence of tricuspid stenosis. Aortic Valve: The aortic valve is tricuspid. There is moderate calcification of the aortic valve. There is moderate thickening of the aortic valve. Aortic valve regurgitation is not visualized. Mild aortic stenosis is present. Aortic valve mean gradient measures 12.5 mmHg. Aortic valve peak gradient measures 24.0 mmHg. Aortic valve area, by VTI measures 2.49 cm. Pulmonic Valve: The pulmonic valve was grossly normal. Pulmonic valve regurgitation is  not visualized. No evidence of pulmonic stenosis. Aorta: The aortic root and ascending aorta are structurally normal, with no evidence of dilitation. Venous: The right lower pulmonary vein is normal. The inferior vena cava is normal in size with greater than 50% respiratory variability, suggesting right atrial pressure of 3 mmHg. IAS/Shunts: The atrial septum is grossly normal.  LEFT VENTRICLE PLAX 2D LVIDd:         5.40 cm   Diastology LVIDs:         3.30 cm   LV e' medial:    6.96 cm/s LV PW:         1.10 cm   LV E/e' medial:  16.4 LV IVS:        1.20 cm   LV e' lateral:   7.62 cm/s LVOT diam:     2.20 cm   LV E/e' lateral: 15.0 LV SV:         136 LV SV Index:   58 LVOT Area:     3.80 cm  RIGHT VENTRICLE RV Basal diam:  4.90 cm RV Mid diam:    4.00 cm RV S prime:     24.00 cm/s TAPSE (M-mode): 3.1 cm LEFT ATRIUM  Index        RIGHT ATRIUM           Index LA diam:        5.60 cm  2.38 cm/m   RA Area:     15.20 cm LA Vol (A2C):   135.5 ml 57.60 ml/m  RA Volume:   35.90 ml  15.26 ml/m LA Vol (A4C):   129.5 ml 55.05 ml/m LA Biplane Vol: 127.0 ml 53.98 ml/m  AORTIC VALVE AV Area (Vmax):    2.28 cm AV Area (Vmean):   2.36 cm AV Area (VTI):     2.49 cm AV Vmax:           245.00 cm/s AV Vmean:          163.000 cm/s AV VTI:            0.546 m AV Peak Grad:      24.0 mmHg AV Mean Grad:      12.5 mmHg LVOT Vmax:         147.00 cm/s LVOT Vmean:        101.000 cm/s LVOT VTI:          0.357 m LVOT/AV VTI ratio: 0.65  AORTA Ao Root diam: 3.50 cm Ao Asc diam:  3.50 cm MITRAL VALVE MV Area (PHT): 1.90 cm     SHUNTS MV Decel Time: 400 msec     Systemic VTI:  0.36 m MV E velocity: 114.00 cm/s  Systemic Diam: 2.20 cm MV A velocity: 135.00 cm/s MV E/A ratio:  0.84 Eleonore Chiquito MD Electronically signed by Eleonore Chiquito MD Signature Date/Time: 07/10/2022/2:44:40 PM    Final    DG Chest Port 1 View  Result Date: 07/10/2022 CLINICAL DATA:  Shortness of breath. Respiratory failure with hypoxia. EXAM: PORTABLE  CHEST 1 VIEW COMPARISON:  07/09/2022 FINDINGS: Artifact from EKG leads. Generalized interstitial coarsening likely from the patient's emphysema. Borderline heart size is stable. There is no edema, consolidation, effusion, or pneumothorax. IMPRESSION: COPD without acute superimposed finding. Electronically Signed   By: Jorje Guild M.D.   On: 07/10/2022 07:17   US Abdomen Limited RUQ (LIVER/GB)  Result Date: 07/10/2022 CLINICAL DATA:  Cirrhosis. EXAM: ULTRASOUND ABDOMEN LIMITED RIGHT UPPER QUADRANT COMPARISON:  Several chest CTs dating back to 03/14/2018. FINDINGS: Gallbladder: No gallstones or wall thickening visualized. No sonographic Murphy sign noted by sonographer. Common bile duct: Diameter: 4 mm Liver: There is severe diffuse increased liver echogenicity most commonly seen in the setting of fatty infiltration. Superimposed inflammation or fibrosis is not excluded. Clinical correlation is recommended. A 1.2 x 2.1 x 1.7 cm hypoechoic lesion in the left medial liver is not characterized but may represent a focus of fat spurring or an atypical hemangioma. No lesion was seen on the CTs. Further characterization with MRI or follow-up with ultrasound in 3 months recommended. Portal vein is patent on color Doppler imaging with normal direction of blood flow towards the liver. Other: None. IMPRESSION: 1. Severe fatty liver 2. Probable focus of fat spurring or atypical hemangioma in the left lobe of the liver. Follow-up with ultrasound in 3 months or further characterization with MRI recommended. Electronically Signed   By: Anner Crete M.D.   On: 07/10/2022 00:59   CT Angio Chest PE W and/or Wo Contrast  Result Date: 07/09/2022 CLINICAL DATA:  Shortness of breath and chest pain EXAM: CT ANGIOGRAPHY CHEST WITH CONTRAST TECHNIQUE: Multidetector CT imaging of the chest was performed using the standard protocol during bolus  administration of intravenous contrast. Multiplanar CT image reconstructions and MIPs  were obtained to evaluate the vascular anatomy. RADIATION DOSE REDUCTION: This exam was performed according to the departmental dose-optimization program which includes automated exposure control, adjustment of the mA and/or kV according to patient size and/or use of iterative reconstruction technique. CONTRAST:  161mL OMNIPAQUE IOHEXOL 350 MG/ML SOLN COMPARISON:  Chest x-ray 07/09/2022, CT chest 12/03/2018 FINDINGS: Cardiovascular: Satisfactory opacification of the pulmonary arteries to the segmental level. No evidence of pulmonary embolism. Moderate aortic atherosclerosis. No aneurysm. Cardiomegaly. No pericardial effusion Mediastinum/Nodes: Midline trachea. No thyroid mass. Borderline mediastinal lymph nodes, AP window lymph node measures 11 mm. Lymphadenopathy has decreased since 2019 comparison. Esophagus within normal limits. Lungs/Pleura: Emphysema. No consolidation, pleural effusion, or pneumothorax Upper Abdomen: Probable hepatic steatosis.  No acute abnormality Musculoskeletal: No chest wall abnormality. No acute or significant osseous findings. Review of the MIP images confirms the above findings. IMPRESSION: 1. Negative for acute pulmonary embolus. 2. Emphysema without acute airspace disease 3. Cardiomegaly Aortic Atherosclerosis (ICD10-I70.0) and Emphysema (ICD10-J43.9). Electronically Signed   By: Donavan Foil M.D.   On: 07/09/2022 21:40   DG Chest 2 View  Result Date: 07/09/2022 CLINICAL DATA:  Shortness of breath EXAM: CHEST - 2 VIEW COMPARISON:  12/04/2018 FINDINGS: Cardiomegaly with mild central congestion. No acute airspace disease, pleural effusion, or pneumothorax. Aortic atherosclerosis. IMPRESSION: Cardiomegaly with mild central congestion Electronically Signed   By: Donavan Foil M.D.   On: 07/09/2022 16:25

## 2022-07-11 NOTE — Progress Notes (Signed)
NAME:  Jeremy Douglas, MRN:  [id number], DOB:  05/17/1956, LOS: 1 ADMISSION DATE:  07/09/2022, CONSULTATION DATE: 07/10/2022 REFERRING MD: [facility]ad, CHIEF COMPLAINT: Refractory hypertension  History of Present Illness:  66 year old male with a plethora of health issues that are listed below who presents with several days of increasing shortness of breath.  He drinks a pint of liquor daily last dose was approximately 24 hours ago.  He has been treated with Lasix, multiple antihypertensives and improved refractory to treatment.  Pulmonary critical care was called to bedside for questionable intubation which she does not need at this time.  We will start him on a Cardene drip continue with diuresis transferred to intensive care unit and if needed will activate later today.  He is being treated for alcohol withdrawal.  He has a history of pulm embolism for which he remains on anticoag elation.  Pulmonary critical care will assume his care at this time.  Pertinent  Medical History   Past Medical History:  Diagnosis Date   Alcohol dependence (HCC) 09/07/2012   Anemia    COPD (chronic obstructive pulmonary disease) (HCC)    Esophagitis 09/07/2012   Per EGD 04/2011   Gastritis 09/07/2012   Per EGD 04/2011   Headache    4 - 5 a yr (no migraines)   Heart murmur    Hypertension    takes Amlodipine,Labetalol,and Clonidine daily   Ischemic chest pain (HCC)    Obesity    OSA treated with BiPAP    Osteoarthritis    "knees" (12/03/2018)   Pulmonary emboli (HCC) 02/2018   Thrombocytopenia (HCC) 09/07/2012   Hx of in past.   Tobacco abuse      Significant Hospital Events: Including procedures, antibiotic start and stop dates in addition to other pertinent events   07/10/2022 admit to the intensive care unit  Interim History / Subjective:  Improved respiration with diuresis. Currently on 4.5L [place] with stable work of breathing and reassuring O2 sats. Mildly tremulous with last CIWA score of 3. Total UOP  1475, on lasix 80 mg bid.  Objective   Blood pressure (!) 129/49, pulse 64, temperature 97.7 F (36.5 C), temperature source Oral, resp. rate (!) 28, height 5\' 10"  (1.778 m), weight 128.4 kg, SpO2 (!) 89 %.    FiO2 (%):  [100 %] 100 %   Intake/Output Summary (Last 24 hours) at 07/11/2022 1008 Last data filed at 07/11/2022 0956 Gross per 24 hour  Intake 1004.86 ml  Output 1675 ml  Net -670.14 ml   Filed Weights   07/09/22 1511 07/10/22 1320  Weight: 120.2 kg 128.4 kg    Examination: General:  Morbid obese, awake, NAD  Lungs: CTAB, No wheeze Cardiovascular: RRR, No murmurs Abdomen: Large abdomen non-distended, Positive BS Extremities: Lower extremity with vascular stasis changes and edema Neuro: A&O X3, No focal deficit  Resolved Hospital Problem list     Assessment & Plan:  Acute on chronic hypoxic respiratory failure  2/2 to CHF exacerbation Stable on 4.5L [place] with reassuring O2 sats  Echo showed Grade II diastolic dysfunction Continue O2 supplementation, O2 sats goal >92% Continue diuresis with Lasix 80mg  BID Strict Is and Os  Hypertensive crisis likely 2/2 medication noncompliance . BP improved to 150s/70s.  Weaning Cleviprex Continue other antihypertensives Diuresis as tolerated Added Amlodipine 10mg  daily  Alcohol abuse 1 pint a day Pt report DT with withdrawals in the past. No seizures. Mildly tremulous on exam this morning with intermittent brady. Last CIWA was 3  Continue CIWA protocol with Ativan PRN Started Phenobarb taper  Thiamine and folic acid  History of pulmonary embolism Continue anticoagulation CT of the chest was negative for new PE  Morbid obesity Weight loss  Best Practice (right click and "Reselect all SmartList Selections" daily)   Diet/type: NPO DVT prophylaxis: DOAC GI prophylaxis: PPI Lines: One-time one-time dated Foley:  Yes, and it is still needed Code Status:  full code Last date of multidisciplinary goals of care discussion  [tbd]

## 2022-07-12 ENCOUNTER — Inpatient Hospital Stay (HOSPITAL_COMMUNITY): Payer: No Typology Code available for payment source

## 2022-07-12 DIAGNOSIS — J9601 Acute respiratory failure with hypoxia: Secondary | ICD-10-CM | POA: Diagnosis not present

## 2022-07-12 LAB — CBC WITH DIFFERENTIAL/PLATELET
Abs Immature Granulocytes: 0.09 10*3/uL — ABNORMAL HIGH (ref 0.00–0.07)
Basophils Absolute: 0 10*3/uL (ref 0.0–0.1)
Basophils Relative: 0 %
Eosinophils Absolute: 0 10*3/uL (ref 0.0–0.5)
Eosinophils Relative: 0 %
HCT: 35.2 % — ABNORMAL LOW (ref 39.0–52.0)
Hemoglobin: 11.2 g/dL — ABNORMAL LOW (ref 13.0–17.0)
Immature Granulocytes: 1 %
Lymphocytes Relative: 6 %
Lymphs Abs: 1 10*3/uL (ref 0.7–4.0)
MCH: 19.6 pg — ABNORMAL LOW (ref 26.0–34.0)
MCHC: 31.8 g/dL (ref 30.0–36.0)
MCV: 61.6 fL — ABNORMAL LOW (ref 80.0–100.0)
Monocytes Absolute: 0.8 10*3/uL (ref 0.1–1.0)
Monocytes Relative: 5 %
Neutro Abs: 14.8 10*3/uL — ABNORMAL HIGH (ref 1.7–7.7)
Neutrophils Relative %: 88 %
Platelets: 122 10*3/uL — ABNORMAL LOW (ref 150–400)
RBC: 5.71 MIL/uL (ref 4.22–5.81)
RDW: 18.1 % — ABNORMAL HIGH (ref 11.5–15.5)
WBC: 16.6 10*3/uL — ABNORMAL HIGH (ref 4.0–10.5)
nRBC: 0.2 % (ref 0.0–0.2)

## 2022-07-12 LAB — COMPREHENSIVE METABOLIC PANEL
ALT: 31 U/L (ref 0–44)
AST: 36 U/L (ref 15–41)
Albumin: 3 g/dL — ABNORMAL LOW (ref 3.5–5.0)
Alkaline Phosphatase: 108 U/L (ref 38–126)
Anion gap: 10 (ref 5–15)
BUN: 39 mg/dL — ABNORMAL HIGH (ref 8–23)
CO2: 25 mmol/L (ref 22–32)
Calcium: 8.8 mg/dL — ABNORMAL LOW (ref 8.9–10.3)
Chloride: 101 mmol/L (ref 98–111)
Creatinine, Ser: 1.53 mg/dL — ABNORMAL HIGH (ref 0.61–1.24)
GFR, Estimated: 50 mL/min — ABNORMAL LOW (ref >60)
Glucose, Bld: 143 mg/dL — ABNORMAL HIGH (ref 70–99)
Potassium: 4.1 mmol/L (ref 3.5–5.1)
Sodium: 136 mmol/L (ref 135–145)
Total Bilirubin: 1.3 mg/dL — ABNORMAL HIGH (ref 0.3–1.2)
Total Protein: 6.4 g/dL — ABNORMAL LOW (ref 6.5–8.1)

## 2022-07-12 LAB — BRAIN NATRIURETIC PEPTIDE: B Natriuretic Peptide: 178.7 pg/mL — ABNORMAL HIGH (ref 0.0–100.0)

## 2022-07-12 LAB — GLUCOSE, CAPILLARY
Glucose-Capillary: 178 mg/dL — ABNORMAL HIGH (ref 70–99)
Glucose-Capillary: 187 mg/dL — ABNORMAL HIGH (ref 70–99)
Glucose-Capillary: 211 mg/dL — ABNORMAL HIGH (ref 70–99)
Glucose-Capillary: 189 mg/dL — ABNORMAL HIGH (ref 70–99)

## 2022-07-12 LAB — MAGNESIUM: Magnesium: 1.9 mg/dL (ref 1.7–2.4)

## 2022-07-12 LAB — PROCALCITONIN: Procalcitonin: <0.1 ng/mL

## 2022-07-12 MED ORDER — SPIRONOLACTONE 25 MG PO TABS: 50.0000 mg | ORAL_TABLET | Freq: Once | ORAL | Status: DC

## 2022-07-12 MED ORDER — MAGNESIUM SULFATE 2 GM/50ML IV SOLN
2.0000 g | Freq: Once | INTRAVENOUS | Status: AC
  Administered 2022-07-12: 2 g via INTRAVENOUS
  Filled 2022-07-12: qty 50

## 2022-07-12 MED ORDER — METOLAZONE 2.5 MG PO TABS: 2.5000 mg | ORAL_TABLET | Freq: Once | ORAL | Status: DC

## 2022-07-12 MED ORDER — ISOSORBIDE MONONITRATE ER 60 MG PO TB24
60.0000 mg | ORAL_TABLET | Freq: Every day | ORAL | Status: DC
  Administered 2022-07-12 – 2022-07-13 (×2): 60 mg via ORAL
  Filled 2022-07-12 (×2): qty 1

## 2022-07-12 MED ORDER — HALOPERIDOL LACTATE 5 MG/ML IJ SOLN: 2.0000 mg | Freq: Four times a day (QID) | INTRAMUSCULAR | Status: DC | PRN

## 2022-07-12 MED ORDER — LOSARTAN POTASSIUM 25 MG PO TABS
25.0000 mg | ORAL_TABLET | Freq: Every day | ORAL | Status: DC
  Administered 2022-07-12 – 2022-07-13 (×2): 25 mg via ORAL
  Filled 2022-07-12 (×2): qty 1

## 2022-07-12 MED ORDER — FUROSEMIDE 10 MG/ML IJ SOLN
60.0000 mg | Freq: Two times a day (BID) | INTRAMUSCULAR | Status: DC
  Administered 2022-07-12: 60 mg via INTRAVENOUS
  Filled 2022-07-12: qty 6

## 2022-07-12 MED ORDER — INSULIN GLARGINE-YFGN 100 UNIT/ML ~~LOC~~ SOLN
10.0000 [IU] | Freq: Every day | SUBCUTANEOUS | Status: DC
  Administered 2022-07-12: 10 [IU] via SUBCUTANEOUS
  Filled 2022-07-12 (×3): qty 0.1

## 2022-07-12 NOTE — Progress Notes (Signed)
[facility] ADULT ICU REPLACEMENT PROTOCOL   The patient does apply for the [facility] Adult ICU Electrolyte Replacment Protocol based on the criteria listed below:   1.Exclusion criteria: TCTS patients, ECMO patients, and Dialysis patients 2. Is GFR >/= 30 ml/min? Yes.    Patient's GFR today is 50 3. Is SCr </= 2? Yes.   Patient's SCr is 1.53 mg/dL 4. Did SCr increase >/= 0.5 in 24 hours? No. 5.Pt's weight >40kg  Yes.   6. Abnormal electrolyte(s): mag 1.9  7. Electrolytes replaced per protocol 8.  Call MD STAT for K+ </= 2.5, Phos </= 1, or Mag </= 1 Physician:  n/a  Melvern Banker 07/12/2022 3:27 AM

## 2022-07-12 NOTE — Progress Notes (Signed)
PROGRESS NOTE                                                                                                                                                                                                             Patient Demographics:    Jeremy Douglas, is a 66 y.o. male, DOB - 10-21-1956, [id number]  Outpatient Primary MD for the patient is [place] date - 07/09/2022    Chief Complaint  Patient presents with   Shortness of Breath        Chest Pain       Brief Narrative (HPI from H&P)  - 66 year old male with history of COPD, diastolic congestive heart failure (Echo 11/2018 EF 55-60% with G2DD), pulmonary embolism on Eliquis, obstructive sleep apnea on CPAP, coronary artery disease, alcohol abuse, hypertension who presents to [facility] with chest pain, shortness of breath and extremely high blood pressure.  He is also been drinking a lot of alcohol for the last few days.  In the ER he was diagnosed with acute hypoxic respiratory failure due to CHF and COPD exacerbation along with hypertensive urgency and admitted to the hospital.   Subjective:   Patient in bed, appears comfortable, denies any headache, no fever, no chest pain or pressure, improved shortness of breath , no abdominal pain. No new focal weakness.   Assessment  & Plan :    Acute respiratory failure with hypoxia ([place]) -  Patient suffering from acute hypoxic respiratory failure, likely multifactorial in origin but large component of acute on chronic diastolic CHF last EF 123456 in 2019.  Also has hypertensive urgency and some element of COPD exacerbation.  He was in ICU now much improved, has been diuresed, continue combination of IV Lasix along with Coreg, Norvasc, hydralazine, Imdur, ARB for diuresis and good blood pressure control, he is moving out of ICU will monitor closely on a telemetry bed, symptoms much improved,  challenge with nasal cannula oxygen in daytime and CPAP nightly.   COPD exacerbation ([place])  - Please see assessment and plan above, resolved mild exacerbation off of steroids.  Acute on chronic diastolic CHF (congestive heart failure) ([place]) - as in #1 above.  Alcohol withdrawal with complication with inpatient treatment, with unspecified complication - in ICU placed on phenobarb taper, stable  on it, he claims he drank for 2 days before he came to the hospital and had not had a drink for several weeks before that, should not technically be going into DTs.  Also added as needed IM Haldol, monitor.  Essential hypertension with hypertensive emergency.  Blood pressure medications have been adjusted, currently on combination of Coreg, Catapres, losartan, hydralazine, diuretics he needed clevidipine drip in ICU.  Blood pressure medications adjusted currently on oral medications monitor.  History of pulmonary embolism  Continue home regimen of Eliquis, CTA and lower extremity venous duplex unremarkable.  Mixed hyperlipidemia - Continuing home regimen of lipid lowering therapy.  Obstructive sleep apnea - Continue home regimen of CPAP nightly,    CKD 3B.  Baseline creatinine appears to be close to 1.6.  Monitor.       Condition - Extremely Guarded  Family Communication  :    Called daughter Danelle Earthly [phone] on 07/12/2022 at 8:41 AM - message left.  Code Status :  Full  Consults  :    PUD Prophylaxis :     Procedures  :     TTE - 1. Left ventricular ejection fraction, by estimation, is 60 to 65%. The left ventricle has normal function. The left ventricle has no regional wall motion abnormalities. Left ventricular diastolic parameters are consistent with Grade II diastolic dysfunction (pseudonormalization).  2. Right ventricular systolic function is normal. The right ventricular size is normal. Tricuspid regurgitation signal is inadequate for assessing PA pressure.  3. Left atrial size was  severely dilated.  4. Right atrial size was mildly dilated.  5. The mitral valve is grossly normal. No evidence of mitral valve regurgitation. Moderate mitral annular calcification.  6. The aortic valve is tricuspid. There is moderate calcification of the aortic valve. There is moderate thickening of the aortic valve. Aortic valve regurgitation is not visualized. Mild aortic valve stenosis.  7. The inferior vena cava is normal in size with greater than 50% respiratory variability, suggesting right atrial pressure of 3 mmHg.  Leg [place] - No DVT  CTA - 1. Negative for acute pulmonary embolus. 2. Emphysema without acute airspace disease 3. Cardiomegaly Aortic Atherosclerosis (ICD10-I70.0) and Emphysema (ICD10-J43.9)      Disposition Plan  :    Status is: Inpatient  DVT Prophylaxis  :    Place and maintain sequential compression device Start: 07/10/22 1928 apixaban (ELIQUIS) tablet 5 mg     Lab Results  Component Value Date   PLT 122 (L) 07/12/2022    Diet :  Diet Order             Diet Heart Room service appropriate? Yes; Fluid consistency: Thin  Diet effective now                    Inpatient Medications  Scheduled Meds:  amLODipine  10 mg Oral Daily   apixaban  5 mg Oral BID   atorvastatin  20 mg Oral q1800   carvedilol  6.25 mg Oral BID WC   Chlorhexidine Gluconate Cloth  6 each Topical Daily   cloNIDine  0.3 mg Oral TID   DULoxetine  60 mg Oral Daily   fluticasone furoate-vilanterol  1 puff Inhalation Daily   folic acid  1 mg Oral Daily   furosemide  80 mg Intravenous BID   hydrALAZINE  100 mg Oral Q8H   insulin aspart  0-15 Units Subcutaneous TID WC   insulin aspart  0-5 Units Subcutaneous QHS   isosorbide mononitrate  60 mg Oral Daily   losartan  50 mg Oral Daily   metolazone  2.5 mg Oral Once   multivitamin with minerals  1 tablet Oral Daily   oxybutynin  5 mg Oral TID   phenobarbital  97.2 mg Oral Q8H   Followed by   Derrill Memo ON 07/13/2022] phenobarbital   64.8 mg Oral Q8H   Followed by   Derrill Memo ON 07/15/2022] phenobarbital  32.4 mg Oral Q8H   spironolactone  50 mg Oral Once   tamsulosin  0.4 mg Oral Daily   thiamine  100 mg Oral Daily   Or   thiamine  100 mg Intravenous Daily   Continuous Infusions:   PRN Meds:.acetaminophen **OR** acetaminophen, enalaprilat, ipratropium-albuterol, LORazepam, [DISCONTINUED] ondansetron **OR** ondansetron (ZOFRAN) IV, mouth rinse, polyethylene glycol  Antibiotics  :    Anti-infectives (From admission, onward)    None        Time Spent in minutes  30   Lala Lund M.D on 07/12/2022 at 8:37 AM  To page go to [email or web address]   [facility] -  Office  [phone]  See all Orders from today for further details    Objective:   Vitals:   07/12/22 0600 07/12/22 0722 07/12/22 0733 07/12/22 0742  BP: (!) 154/77     Pulse: (!) 56  (!) 56   Resp: 17  20   Temp:  98 F (36.7 C)    TempSrc:  Axillary    SpO2: (!) 89%  93% 92%  Weight:      Height:        Wt Readings from Last 3 Encounters:  07/12/22 130 kg  12/11/18 134.7 kg  12/06/18 134.8 kg     Intake/Output Summary (Last 24 hours) at 07/12/2022 0837 Last data filed at 07/12/2022 0500 Gross per 24 hour  Intake 500.5 ml  Output 3850 ml  Net -3349.5 ml     Physical Exam  Awake Alert, No new F.N deficits, Normal affect [place].AT,PERRAL Supple Neck, No JVD,   Symmetrical Chest wall movement, Good air movement bilaterally, CTAB RRR,No Gallops, Rubs or new Murmurs,  +ve B.Sounds, Abd Soft, No tenderness,   No Cyanosis, resolved lower extremity edema    Data Review:    CBC Recent Labs  Lab 07/09/22 1628 07/09/22 2321 07/10/22 0533 07/10/22 1115 07/11/22 0636 07/12/22 0122  WBC 8.4  --  7.9  --  16.2* 16.6*  HGB 11.8* 13.9 12.3* 13.3 11.2* 11.2*  HCT 37.6* 41.0 38.6* 39.0 34.4* 35.2*  PLT 138*  --  130*  --  123* 122*  MCV 62.6*  --  61.9*  --  60.8* 61.6*  MCH 19.6*  --  19.7*  --  19.8* 19.6*  MCHC 31.4  --   31.9  --  32.6 31.8  RDW 18.0*  --  18.0*  --  17.9* 18.1*  LYMPHSABS 1.7  --  0.6*  --  0.5* 1.0  MONOABS 0.6  --  0.0*  --  0.0* 0.8  EOSABS 0.1  --  0.0  --  0.0 0.0  BASOSABS 0.1  --  0.0  --  0.0 0.0    Electrolytes Recent Labs  Lab 07/09/22 1628 07/09/22 2321 07/10/22 0533 07/10/22 1115 07/11/22 0636 07/12/22 0122  NA 139 139 139 137 135 136  K 3.8 3.8 3.9 4.0 3.6 4.1  CL 108  --  105  --  103 101  CO2 16*  --  19*  --  23 25  GLUCOSE  94  --  156*  --  183* 143*  BUN 21  --  24*  --  35* 39*  CREATININE 1.32*  --  1.45*  --  1.46* 1.53*  CALCIUM 7.7*  --  8.2*  --  8.6* 8.8*  AST 54*  --  50*  --  35 36  ALT 33  --  35  --  29 31  ALKPHOS 135*  --  147*  --  117 108  BILITOT 1.2  --  1.9*  --  1.2 1.3*  ALBUMIN 3.0*  --  3.2*  --  3.0* 3.0*  MG  --   --  1.7  --  2.1 1.9  DDIMER 2.82*  --   --   --   --   --   PROCALCITON  --   --   --   --   --  <0.10  HGBA1C  --   --   --   --  5.5  --   BNP 144.5*  --   --   --  210.5* 178.7*   ID Labs Recent Labs  Lab 07/09/22 1628 07/10/22 0533 07/11/22 0636 07/12/22 0122  WBC 8.4 7.9 16.2* 16.6*  PLT 138* 130* 123* 122*  DDIMER 2.82*  --   --   --   PROCALCITON  --   --   --  <0.10  CREATININE 1.32* 1.45* 1.46* 1.53*    Radiology Reports DG Chest Port 1 View  Result Date: 07/12/2022 CLINICAL DATA:  Shortness of breath. EXAM: PORTABLE CHEST 1 VIEW COMPARISON:  07/11/2022 FINDINGS: 0618 hours. The cardio pericardial silhouette is enlarged. Interval increase in diffuse interstitial opacity suggests edema with associated basilar atelectasis. Telemetry leads overlie the chest. IMPRESSION: Interval increase in diffuse interstitial opacity suggesting pulmonary edema. Electronically Signed   By: Misty Stanley M.D.   On: 07/12/2022 06:38   DG Chest Port 1 View  Result Date: 07/11/2022 CLINICAL DATA:  Provided history: Shortness of breath and chest pain. History of respiratory failure. EXAM: PORTABLE CHEST 1 VIEW COMPARISON:   Prior chest radiographs 07/10/2022 and earlier. CT angiogram chest 07/09/2022. FINDINGS: Heart size at the upper limits of normal. Aortic atherosclerosis. Emphysema, better appreciated on the recent prior chest CT of 07/09/2022. Chronic prominence of the interstitial lung markings. Minimal ill-defined opacity within the left lung base. No evidence of pleural effusion or pneumothorax. No acute bony abnormality identified. Degenerative changes of the spine. IMPRESSION: Minimal ill-defined opacity within the left lung base, with an appearance favoring atelectasis. Aortic Atherosclerosis (ICD10-I70.0) and Emphysema (ICD10-J43.9). Electronically Signed   By: Kellie Simmering D.O.   On: 07/11/2022 07:21   VAS [place] LOWER EXTREMITY VENOUS (DVT)  Result Date: 07/10/2022  Lower Venous DVT Study Patient Name:  HOSIE NORDELL  Date of Exam:   07/10/2022 Medical Rec #: [id number]         Accession #:    [id number] Date of Birth: [birth year]         Patient Gender: M Patient Age:   34 years Exam Location:  [facility] Procedure:      VAS [place] LOWER EXTREMITY VENOUS (DVT) Referring Phys: Deno Etienne [facility] --------------------------------------------------------------------------------  Indications: Swelling, and Elevated Ddimer.  Risk Factors: None identified. Limitations: Patient positioning and poor ultrasound/tissue interface. Comparison Study: No prior studies. Performing Technologist: Oliver Hum RVT  Examination Guidelines: A complete evaluation includes B-mode imaging, spectral Doppler, color Doppler, and power Doppler as needed of all accessible portions of each  vessel. Bilateral testing is considered an integral part of a complete examination. Limited examinations for reoccurring indications may be performed as noted. The reflux portion of the exam is performed with the patient in reverse Trendelenburg.  +---------+---------------+---------+-----------+----------+--------------+ RIGHT     CompressibilityPhasicitySpontaneityPropertiesThrombus Aging +---------+---------------+---------+-----------+----------+--------------+ CFV      Full           Yes      Yes                                 +---------+---------------+---------+-----------+----------+--------------+ SFJ      Full                                                        +---------+---------------+---------+-----------+----------+--------------+ FV Prox  Full                                                        +---------+---------------+---------+-----------+----------+--------------+ FV Mid   Full                                                        +---------+---------------+---------+-----------+----------+--------------+ FV DistalFull                                                        +---------+---------------+---------+-----------+----------+--------------+ PFV      Full                                                        +---------+---------------+---------+-----------+----------+--------------+ POP      Full           Yes      Yes                                 +---------+---------------+---------+-----------+----------+--------------+ PTV      Full                                                        +---------+---------------+---------+-----------+----------+--------------+ PERO     Full                                                        +---------+---------------+---------+-----------+----------+--------------+   +---------+---------------+---------+-----------+----------+--------------+ LEFT     CompressibilityPhasicitySpontaneityPropertiesThrombus Aging +---------+---------------+---------+-----------+----------+--------------+  CFV      Full           Yes      Yes                                 +---------+---------------+---------+-----------+----------+--------------+ SFJ      Full                                                         +---------+---------------+---------+-----------+----------+--------------+ FV Prox  Full                                                        +---------+---------------+---------+-----------+----------+--------------+ FV Mid   Full                                                        +---------+---------------+---------+-----------+----------+--------------+ FV Distal               Yes      Yes                                 +---------+---------------+---------+-----------+----------+--------------+ PFV      Full                                                        +---------+---------------+---------+-----------+----------+--------------+ POP      Full           Yes      Yes                                 +---------+---------------+---------+-----------+----------+--------------+ PTV      Full                                                        +---------+---------------+---------+-----------+----------+--------------+ PERO     Full                                                        +---------+---------------+---------+-----------+----------+--------------+     Summary: RIGHT: - There is no evidence of deep vein thrombosis in the lower extremity. However, portions of this examination were limited- see technologist comments above.  - No cystic structure found in the popliteal fossa.  LEFT: - There is no evidence of deep vein thrombosis in the lower extremity. However, portions  of this examination were limited- see technologist comments above.  - No cystic structure found in the popliteal fossa.  *See table(s) above for measurements and observations. Electronically signed by Waverly Ferrari MD on 07/10/2022 at 6:30:05 PM.    Final    ECHOCARDIOGRAM COMPLETE  Result Date: 07/10/2022    ECHOCARDIOGRAM REPORT   Patient Name:   OTILIO GROLEAU Date of Exam: 07/10/2022 Medical Rec #:  [id number]        Height:       70.0 in Accession  #:    [id number]       Weight:       265.0 lb Date of Birth:  29-Aug-1956        BSA:          2.353 m Patient Age:    65 years         BP:           162/65 mmHg Patient Gender: M                HR:           69 bpm. Exam Location:  Inpatient Procedure: 2D Echo, Cardiac Doppler and Color Doppler Indications:    CHF-Acute Systolic I50.21  History:        Patient has prior history of Echocardiogram examinations, most                 recent 12/03/2018. COPD exacerbation; Risk Factors:Sleep Apnea,                 Hypertension and Dyslipidemia. History of pulmonary embolism.                 Chronic kidney disease.  Sonographer:    Leta Jungling RDCS Referring Phys: Marinda Elk IMPRESSIONS  1. Left ventricular ejection fraction, by estimation, is 60 to 65%. The left ventricle has normal function. The left ventricle has no regional wall motion abnormalities. Left ventricular diastolic parameters are consistent with Grade II diastolic dysfunction (pseudonormalization).  2. Right ventricular systolic function is normal. The right ventricular size is normal. Tricuspid regurgitation signal is inadequate for assessing PA pressure.  3. Left atrial size was severely dilated.  4. Right atrial size was mildly dilated.  5. The mitral valve is grossly normal. No evidence of mitral valve regurgitation. Moderate mitral annular calcification.  6. The aortic valve is tricuspid. There is moderate calcification of the aortic valve. There is moderate thickening of the aortic valve. Aortic valve regurgitation is not visualized. Mild aortic valve stenosis.  7. The inferior vena cava is normal in size with greater than 50% respiratory variability, suggesting right atrial pressure of 3 mmHg. FINDINGS  Left Ventricle: Left ventricular ejection fraction, by estimation, is 60 to 65%. The left ventricle has normal function. The left ventricle has no regional wall motion abnormalities. The left ventricular internal cavity size was normal in  size. There is  no left ventricular hypertrophy. Left ventricular diastolic parameters are consistent with Grade II diastolic dysfunction (pseudonormalization). Right Ventricle: The right ventricular size is normal. No increase in right ventricular wall thickness. Right ventricular systolic function is normal. Tricuspid regurgitation signal is inadequate for assessing PA pressure. Left Atrium: Left atrial size was severely dilated. Right [place]rium: Right atrial size was mildly dilated. Pericardium: There is no evidence of pericardial effusion. Mitral Valve: The mitral valve is grossly normal. Moderate mitral annular calcification. No evidence of mitral valve regurgitation. Tricuspid Valve: The tricuspid valve is grossly  normal. Tricuspid valve regurgitation is not demonstrated. No evidence of tricuspid stenosis. Aortic Valve: The aortic valve is tricuspid. There is moderate calcification of the aortic valve. There is moderate thickening of the aortic valve. Aortic valve regurgitation is not visualized. Mild aortic stenosis is present. Aortic valve mean gradient measures 12.5 mmHg. Aortic valve peak gradient measures 24.0 mmHg. Aortic valve area, by VTI measures 2.49 cm. Pulmonic Valve: The pulmonic valve was grossly normal. Pulmonic valve regurgitation is not visualized. No evidence of pulmonic stenosis. Aorta: The aortic root and ascending aorta are structurally normal, with no evidence of dilitation. Venous: The right lower pulmonary vein is normal. The inferior vena cava is normal in size with greater than 50% respiratory variability, suggesting right atrial pressure of 3 mmHg. IAS/Shunts: The atrial septum is grossly normal.  LEFT VENTRICLE PLAX 2D LVIDd:         5.40 cm   Diastology LVIDs:         3.30 cm   LV e' medial:    6.96 cm/s LV PW:         1.10 cm   LV E/e' medial:  16.4 LV IVS:        1.20 cm   LV e' lateral:   7.62 cm/s LVOT diam:     2.20 cm   LV E/e' lateral: 15.0 LV SV:         136 LV SV Index:   58  LVOT Area:     3.80 cm  RIGHT VENTRICLE RV Basal diam:  4.90 cm RV Mid diam:    4.00 cm RV S prime:     24.00 cm/s TAPSE (M-mode): 3.1 cm LEFT ATRIUM              Index        RIGHT ATRIUM           Index LA diam:        5.60 cm  2.38 cm/m   RA Area:     15.20 cm LA Vol (A2C):   135.5 ml 57.60 ml/m  RA Volume:   35.90 ml  15.26 ml/m LA Vol (A4C):   129.5 ml 55.05 ml/m LA Biplane Vol: 127.0 ml 53.98 ml/m  AORTIC VALVE AV Area (Vmax):    2.28 cm AV Area (Vmean):   2.36 cm AV Area (VTI):     2.49 cm AV Vmax:           245.00 cm/s AV Vmean:          163.000 cm/s AV VTI:            0.546 m AV Peak Grad:      24.0 mmHg AV Mean Grad:      12.5 mmHg LVOT Vmax:         147.00 cm/s LVOT Vmean:        101.000 cm/s LVOT VTI:          0.357 m LVOT/AV VTI ratio: 0.65  AORTA Ao Root diam: 3.50 cm Ao Asc diam:  3.50 cm MITRAL VALVE MV Area (PHT): 1.90 cm     SHUNTS MV Decel Time: 400 msec     Systemic VTI:  0.36 m MV E velocity: 114.00 cm/s  Systemic Diam: 2.20 cm MV A velocity: 135.00 cm/s MV E/A ratio:  0.84 Eleonore Chiquito MD Electronically signed by Eleonore Chiquito MD Signature Date/Time: 07/10/2022/2:44:40 PM    Final    DG Chest Port 1 View  Result Date: 07/10/2022 CLINICAL  DATA:  Shortness of breath. Respiratory failure with hypoxia. EXAM: PORTABLE CHEST 1 VIEW COMPARISON:  07/09/2022 FINDINGS: Artifact from EKG leads. Generalized interstitial coarsening likely from the patient's emphysema. Borderline heart size is stable. There is no edema, consolidation, effusion, or pneumothorax. IMPRESSION: COPD without acute superimposed finding. Electronically Signed   By: Jorje Guild M.D.   On: 07/10/2022 07:17   US Abdomen Limited RUQ (LIVER/GB)  Result Date: 07/10/2022 CLINICAL DATA:  Cirrhosis. EXAM: ULTRASOUND ABDOMEN LIMITED RIGHT UPPER QUADRANT COMPARISON:  Several chest CTs dating back to 03/14/2018. FINDINGS: Gallbladder: No gallstones or wall thickening visualized. No sonographic Murphy sign noted by  sonographer. Common bile duct: Diameter: 4 mm Liver: There is severe diffuse increased liver echogenicity most commonly seen in the setting of fatty infiltration. Superimposed inflammation or fibrosis is not excluded. Clinical correlation is recommended. A 1.2 x 2.1 x 1.7 cm hypoechoic lesion in the left medial liver is not characterized but may represent a focus of fat spurring or an atypical hemangioma. No lesion was seen on the CTs. Further characterization with MRI or follow-up with ultrasound in 3 months recommended. Portal vein is patent on color Doppler imaging with normal direction of blood flow towards the liver. Other: None. IMPRESSION: 1. Severe fatty liver 2. Probable focus of fat spurring or atypical hemangioma in the left lobe of the liver. Follow-up with ultrasound in 3 months or further characterization with MRI recommended. Electronically Signed   By: Anner Crete M.D.   On: 07/10/2022 00:59   CT Angio Chest PE W and/or Wo Contrast  Result Date: 07/09/2022 CLINICAL DATA:  Shortness of breath and chest pain EXAM: CT ANGIOGRAPHY CHEST WITH CONTRAST TECHNIQUE: Multidetector CT imaging of the chest was performed using the standard protocol during bolus administration of intravenous contrast. Multiplanar CT image reconstructions and MIPs were obtained to evaluate the vascular anatomy. RADIATION DOSE REDUCTION: This exam was performed according to the departmental dose-optimization program which includes automated exposure control, adjustment of the mA and/or kV according to patient size and/or use of iterative reconstruction technique. CONTRAST:  115mL OMNIPAQUE IOHEXOL 350 MG/ML SOLN COMPARISON:  Chest x-ray 07/09/2022, CT chest 12/03/2018 FINDINGS: Cardiovascular: Satisfactory opacification of the pulmonary arteries to the segmental level. No evidence of pulmonary embolism. Moderate aortic atherosclerosis. No aneurysm. Cardiomegaly. No pericardial effusion Mediastinum/Nodes: Midline trachea. No  thyroid mass. Borderline mediastinal lymph nodes, AP window lymph node measures 11 mm. Lymphadenopathy has decreased since 2019 comparison. Esophagus within normal limits. Lungs/Pleura: Emphysema. No consolidation, pleural effusion, or pneumothorax Upper Abdomen: Probable hepatic steatosis.  No acute abnormality Musculoskeletal: No chest wall abnormality. No acute or significant osseous findings. Review of the MIP images confirms the above findings. IMPRESSION: 1. Negative for acute pulmonary embolus. 2. Emphysema without acute airspace disease 3. Cardiomegaly Aortic Atherosclerosis (ICD10-I70.0) and Emphysema (ICD10-J43.9). Electronically Signed   By: Donavan Foil M.D.   On: 07/09/2022 21:40   DG Chest 2 View  Result Date: 07/09/2022 CLINICAL DATA:  Shortness of breath EXAM: CHEST - 2 VIEW COMPARISON:  12/04/2018 FINDINGS: Cardiomegaly with mild central congestion. No acute airspace disease, pleural effusion, or pneumothorax. Aortic atherosclerosis. IMPRESSION: Cardiomegaly with mild central congestion Electronically Signed   By: Donavan Foil M.D.   On: 07/09/2022 16:25

## 2022-07-13 ENCOUNTER — Other Ambulatory Visit (HOSPITAL_COMMUNITY): Payer: Self-pay

## 2022-07-13 ENCOUNTER — Inpatient Hospital Stay (HOSPITAL_COMMUNITY): Payer: No Typology Code available for payment source

## 2022-07-13 DIAGNOSIS — J9601 Acute respiratory failure with hypoxia: Secondary | ICD-10-CM | POA: Diagnosis not present

## 2022-07-13 LAB — COMPREHENSIVE METABOLIC PANEL
ALT: 29 U/L (ref 0–44)
AST: 26 U/L (ref 15–41)
Albumin: 2.8 g/dL — ABNORMAL LOW (ref 3.5–5.0)
Alkaline Phosphatase: 97 U/L (ref 38–126)
Anion gap: 6 (ref 5–15)
BUN: 40 mg/dL — ABNORMAL HIGH (ref 8–23)
CO2: 30 mmol/L (ref 22–32)
Calcium: 8.3 mg/dL — ABNORMAL LOW (ref 8.9–10.3)
Chloride: 100 mmol/L (ref 98–111)
Creatinine, Ser: 1.43 mg/dL — ABNORMAL HIGH (ref 0.61–1.24)
GFR, Estimated: 55 mL/min — ABNORMAL LOW (ref >60)
Glucose, Bld: 98 mg/dL (ref 70–99)
Potassium: 3.4 mmol/L — ABNORMAL LOW (ref 3.5–5.1)
Sodium: 136 mmol/L (ref 135–145)
Total Bilirubin: 1 mg/dL (ref 0.3–1.2)
Total Protein: 6.1 g/dL — ABNORMAL LOW (ref 6.5–8.1)

## 2022-07-13 LAB — CBC WITH DIFFERENTIAL/PLATELET
Abs Immature Granulocytes: 0.06 10*3/uL (ref 0.00–0.07)
Basophils Absolute: 0 10*3/uL (ref 0.0–0.1)
Basophils Relative: 0 %
Eosinophils Absolute: 0.1 10*3/uL (ref 0.0–0.5)
Eosinophils Relative: 1 %
HCT: 32.3 % — ABNORMAL LOW (ref 39.0–52.0)
Hemoglobin: 10.4 g/dL — ABNORMAL LOW (ref 13.0–17.0)
Immature Granulocytes: 1 %
Lymphocytes Relative: 17 %
Lymphs Abs: 2.1 10*3/uL (ref 0.7–4.0)
MCH: 19.8 pg — ABNORMAL LOW (ref 26.0–34.0)
MCHC: 32.2 g/dL (ref 30.0–36.0)
MCV: 61.5 fL — ABNORMAL LOW (ref 80.0–100.0)
Monocytes Absolute: 0.9 10*3/uL (ref 0.1–1.0)
Monocytes Relative: 7 %
Neutro Abs: 9 10*3/uL — ABNORMAL HIGH (ref 1.7–7.7)
Neutrophils Relative %: 74 %
Platelets: 105 10*3/uL — ABNORMAL LOW (ref 150–400)
RBC: 5.25 MIL/uL (ref 4.22–5.81)
RDW: 17.4 % — ABNORMAL HIGH (ref 11.5–15.5)
WBC: 12.1 10*3/uL — ABNORMAL HIGH (ref 4.0–10.5)
nRBC: 0 % (ref 0.0–0.2)

## 2022-07-13 LAB — BRAIN NATRIURETIC PEPTIDE: B Natriuretic Peptide: 117.1 pg/mL — ABNORMAL HIGH (ref 0.0–100.0)

## 2022-07-13 LAB — GLUCOSE, CAPILLARY
Glucose-Capillary: 132 mg/dL — ABNORMAL HIGH (ref 70–99)
Glucose-Capillary: 122 mg/dL — ABNORMAL HIGH (ref 70–99)
Glucose-Capillary: 166 mg/dL — ABNORMAL HIGH (ref 70–99)

## 2022-07-13 LAB — PROCALCITONIN: Procalcitonin: <0.1 ng/mL

## 2022-07-13 LAB — MAGNESIUM: Magnesium: 2 mg/dL (ref 1.7–2.4)

## 2022-07-13 MED ORDER — CLONIDINE HCL 0.3 MG PO TABS
0.3000 mg | ORAL_TABLET | Freq: Three times a day (TID) | ORAL | 0 refills | Status: DC
  Filled 2022-07-13: qty 90, 30d supply, fill #0

## 2022-07-13 MED ORDER — FUROSEMIDE 10 MG/ML IJ SOLN
80.0000 mg | Freq: Two times a day (BID) | INTRAMUSCULAR | Status: DC
  Administered 2022-07-13: 80 mg via INTRAVENOUS
  Filled 2022-07-13: qty 8

## 2022-07-13 MED ORDER — POTASSIUM CHLORIDE CRYS ER 20 MEQ PO TBCR
40.0000 meq | EXTENDED_RELEASE_TABLET | Freq: Once | ORAL | Status: AC
  Administered 2022-07-13: 40 meq via ORAL
  Filled 2022-07-13: qty 2

## 2022-07-13 MED ORDER — HYDRALAZINE HCL 100 MG PO TABS
100.0000 mg | ORAL_TABLET | Freq: Three times a day (TID) | ORAL | 0 refills | Status: DC
Start: 2022-07-13 — End: 2022-07-13
  Filled 2022-07-13: qty 90, 30d supply, fill #0

## 2022-07-13 MED ORDER — LOSARTAN POTASSIUM 50 MG PO TABS
25.0000 mg | ORAL_TABLET | Freq: Every day | ORAL | 0 refills | Status: DC
  Filled 2022-07-13: qty 30, 60d supply, fill #0

## 2022-07-13 MED ORDER — ISOSORBIDE MONONITRATE ER 60 MG PO TB24
60.0000 mg | ORAL_TABLET | Freq: Every day | ORAL | 0 refills | Status: DC
  Filled 2022-07-13: qty 30, 30d supply, fill #0

## 2022-07-13 MED ORDER — ALBUTEROL SULFATE HFA 108 (90 BASE) MCG/ACT IN AERS
2.0000 | INHALATION_SPRAY | Freq: Four times a day (QID) | RESPIRATORY_TRACT | 0 refills | Status: AC | PRN
Start: 2022-07-13 — End: ?

## 2022-07-13 MED ORDER — HYDRALAZINE HCL 100 MG PO TABS
100.0000 mg | ORAL_TABLET | Freq: Three times a day (TID) | ORAL | 0 refills | Status: AC
Start: 2022-07-13 — End: 2023-03-29

## 2022-07-13 MED ORDER — SPIRONOLACTONE 25 MG PO TABS
50.0000 mg | ORAL_TABLET | Freq: Once | ORAL | Status: AC
  Administered 2022-07-13: 50 mg via ORAL
  Filled 2022-07-13: qty 2

## 2022-07-13 MED ORDER — THIAMINE HCL 100 MG PO TABS: 100.0000 mg | ORAL_TABLET | Freq: Every day | ORAL | 0 refills | Status: DC

## 2022-07-13 MED ORDER — AMLODIPINE BESYLATE 10 MG PO TABS
10.0000 mg | ORAL_TABLET | Freq: Every day | ORAL | 0 refills | Status: DC
  Filled 2022-07-13: qty 30, 30d supply, fill #0

## 2022-07-13 MED ORDER — THIAMINE HCL 100 MG PO TABS
100.0000 mg | ORAL_TABLET | Freq: Every day | ORAL | 0 refills | Status: DC
  Filled 2022-07-13: qty 30, 30d supply, fill #0

## 2022-07-13 MED ORDER — ISOSORBIDE MONONITRATE ER 60 MG PO TB24: 60.0000 mg | ORAL_TABLET | Freq: Every day | ORAL | 0 refills | Status: DC

## 2022-07-13 MED ORDER — CARVEDILOL 6.25 MG PO TABS
6.2500 mg | ORAL_TABLET | Freq: Two times a day (BID) | ORAL | 0 refills | Status: DC
  Filled 2022-07-13: qty 60, 30d supply, fill #0

## 2022-07-13 MED ORDER — LOSARTAN POTASSIUM 50 MG PO TABS: 25.0000 mg | ORAL_TABLET | Freq: Every day | ORAL | 0 refills | Status: DC

## 2022-07-13 MED ORDER — METOLAZONE 2.5 MG PO TABS
2.5000 mg | ORAL_TABLET | Freq: Once | ORAL | Status: AC
  Administered 2022-07-13: 2.5 mg via ORAL
  Filled 2022-07-13: qty 1

## 2022-07-13 MED ORDER — CARVEDILOL 6.25 MG PO TABS: 6.2500 mg | ORAL_TABLET | Freq: Two times a day (BID) | ORAL | 0 refills | Status: DC

## 2022-07-13 MED ORDER — ALBUTEROL SULFATE HFA 108 (90 BASE) MCG/ACT IN AERS
2.0000 | INHALATION_SPRAY | Freq: Four times a day (QID) | RESPIRATORY_TRACT | 0 refills | Status: DC | PRN
  Filled 2022-07-13: qty 6.7, fill #0

## 2022-07-13 MED ORDER — SPIRONOLACTONE 25 MG PO TABS: 25.0000 mg | ORAL_TABLET | Freq: Every day | ORAL | 0 refills | Status: DC

## 2022-07-13 MED ORDER — FOLIC ACID 1 MG PO TABS: 1.0000 mg | ORAL_TABLET | Freq: Every day | ORAL | 0 refills | Status: DC

## 2022-07-13 MED ORDER — FOLIC ACID 1 MG PO TABS
1.0000 mg | ORAL_TABLET | Freq: Every day | ORAL | 0 refills | Status: DC
  Filled 2022-07-13: qty 30, 30d supply, fill #0

## 2022-07-13 MED ORDER — SPIRONOLACTONE 25 MG PO TABS
25.0000 mg | ORAL_TABLET | Freq: Every day | ORAL | 0 refills | Status: DC
  Filled 2022-07-13: qty 30, 30d supply, fill #0

## 2022-07-13 MED ORDER — AMLODIPINE BESYLATE 10 MG PO TABS: 10.0000 mg | ORAL_TABLET | Freq: Every day | ORAL | 0 refills | Status: DC

## 2022-07-13 MED ORDER — CLONIDINE HCL 0.3 MG PO TABS: 0.3000 mg | ORAL_TABLET | Freq: Three times a day (TID) | ORAL | 0 refills | Status: DC

## 2022-07-13 NOTE — TOC CAGE-AID Note (Signed)
Transition of Care [facility]) - CAGE-AID Screening   Patient Details  Name: Jeremy Douglas MRN: [id number] Date of Birth: 1956/07/31  Transition of Care [facility] CM/SW Contact:    Beckie Busing, RN Phone Number:[phone]  07/13/2022, 12:31 PM   Clinical Narrative: Patient refused CAGE aid questions and resources   CAGE-AID Screening: Substance Abuse Screening unable to be completed due to: : Patient Refused             Substance Abuse Education Offered: Yes  Substance abuse interventions: Other (must comment) (patient refused)

## 2022-07-13 NOTE — Plan of Care (Signed)
Problem: Education: Goal: Ability to demonstrate management of disease process will improve Outcome: Adequate for Discharge Goal: Ability to verbalize understanding of medication therapies will improve Outcome: Adequate for Discharge Goal: Individualized Educational Video(s) Outcome: Adequate for Discharge   Problem: Activity: Goal: Capacity to carry out activities will improve Outcome: Adequate for Discharge   Problem: Cardiac: Goal: Ability to achieve and maintain adequate cardiopulmonary perfusion will improve Outcome: Adequate for Discharge   Problem: Education: Goal: Knowledge of disease or condition will improve Outcome: Adequate for Discharge Goal: Knowledge of the prescribed therapeutic regimen will improve Outcome: Adequate for Discharge Goal: Individualized Educational Video(s) Outcome: Adequate for Discharge   Problem: Activity: Goal: Ability to tolerate increased activity will improve Outcome: Adequate for Discharge Goal: Will verbalize the importance of balancing activity with adequate rest periods Outcome: Adequate for Discharge   Problem: Respiratory: Goal: Ability to maintain a clear airway will improve Outcome: Adequate for Discharge Goal: Levels of oxygenation will improve Outcome: Adequate for Discharge Goal: Ability to maintain adequate ventilation will improve Outcome: Adequate for Discharge   Problem: Education: Goal: Knowledge of General Education information will improve Description: Including pain rating scale, medication(s)/side effects and non-pharmacologic comfort measures Outcome: Adequate for Discharge   Problem: Health Behavior/Discharge Planning: Goal: Ability to manage health-related needs will improve Outcome: Adequate for Discharge   Problem: Clinical Measurements: Goal: Ability to maintain clinical measurements within normal limits will improve Outcome: Adequate for Discharge Goal: Will remain free from infection Outcome:  Adequate for Discharge Goal: Diagnostic test results will improve Outcome: Adequate for Discharge Goal: Respiratory complications will improve Outcome: Adequate for Discharge Goal: Cardiovascular complication will be avoided Outcome: Adequate for Discharge   Problem: Activity: Goal: Risk for activity intolerance will decrease Outcome: Adequate for Discharge   Problem: Nutrition: Goal: Adequate nutrition will be maintained Outcome: Adequate for Discharge   Problem: Coping: Goal: Level of anxiety will decrease Outcome: Adequate for Discharge   Problem: Elimination: Goal: Will not experience complications related to bowel motility Outcome: Adequate for Discharge Goal: Will not experience complications related to urinary retention Outcome: Adequate for Discharge   Problem: Pain Managment: Goal: General experience of comfort will improve Outcome: Adequate for Discharge   Problem: Safety: Goal: Ability to remain free from injury will improve Outcome: Adequate for Discharge   Problem: Skin Integrity: Goal: Risk for impaired skin integrity will decrease Outcome: Adequate for Discharge   Problem: Education: Goal: Ability to describe self-care measures that may prevent or decrease complications (Diabetes Survival Skills Education) will improve Outcome: Adequate for Discharge Goal: Individualized Educational Video(s) Outcome: Adequate for Discharge   Problem: Coping: Goal: Ability to adjust to condition or change in health will improve Outcome: Adequate for Discharge   Problem: Fluid Volume: Goal: Ability to maintain a balanced intake and output will improve Outcome: Adequate for Discharge   Problem: Health Behavior/Discharge Planning: Goal: Ability to identify and utilize available resources and services will improve Outcome: Adequate for Discharge Goal: Ability to manage health-related needs will improve Outcome: Adequate for Discharge   Problem: Metabolic: Goal:  Ability to maintain appropriate glucose levels will improve Outcome: Adequate for Discharge   Problem: Nutritional: Goal: Maintenance of adequate nutrition will improve Outcome: Adequate for Discharge Goal: Progress toward achieving an optimal weight will improve Outcome: Adequate for Discharge   Problem: Skin Integrity: Goal: Risk for impaired skin integrity will decrease Outcome: Adequate for Discharge   Problem: Tissue Perfusion: Goal: Adequacy of tissue perfusion will improve Outcome: Adequate for Discharge   Problem: Education:  Goal: Ability to describe self-care measures that may prevent or decrease complications (Diabetes Survival Skills Education) will improve 07/13/2022 1828 by Mercy Riding, RN Outcome: Adequate for Discharge 07/13/2022 1827 by Mercy Riding, RN Outcome: Adequate for Discharge Goal: Individualized Educational Video(s) Outcome: Adequate for Discharge   Problem: Coping: Goal: Ability to adjust to condition or change in health will improve Outcome: Adequate for Discharge   Problem: Fluid Volume: Goal: Ability to maintain a balanced intake and output will improve Outcome: Adequate for Discharge   Problem: Health Behavior/Discharge Planning: Goal: Ability to identify and utilize available resources and services will improve Outcome: Adequate for Discharge Goal: Ability to manage health-related needs will improve Outcome: Adequate for Discharge   Problem: Metabolic: Goal: Ability to maintain appropriate glucose levels will improve Outcome: Adequate for Discharge   Problem: Nutritional: Goal: Maintenance of adequate nutrition will improve 07/13/2022 1828 by Mercy Riding, RN Outcome: Adequate for Discharge 07/13/2022 1828 by Mercy Riding, RN Outcome: Adequate for Discharge Goal: Progress toward achieving an optimal weight will improve Outcome: Adequate for Discharge   Problem: Skin Integrity: Goal: Risk for impaired skin integrity will  decrease Outcome: Adequate for Discharge   Problem: Tissue Perfusion: Goal: Adequacy of tissue perfusion will improve Outcome: Adequate for Discharge

## 2022-07-13 NOTE — Discharge Instructions (Addendum)
[facility] and your cardiologist in 7 days   Get CBC, CMP, magnesium, 2 view Chest X ray -  checked next visit within 1 week by Primary MD    Activity: As tolerated with Full fall precautions use walker/cane & assistance as needed  Disposition Home   Diet: Heart Healthy - Heart Healthy, low carbohydrate diet with 1.5 L fluid restriction.  Check CBGs q. [facility].   Check your Weight same time everyday, if you gain over 2 pounds, or you develop in leg swelling, experience more shortness of breath or chest pain, call your Primary MD immediately. Follow Cardiac Low Salt Diet and 1.5 lit/day fluid restriction.  Special Instructions: If you have smoked or chewed Tobacco  in the last 2 yrs please stop smoking, stop any regular Alcohol  and or any Recreational drug use.  On your next visit with your primary care physician please Get Medicines reviewed and adjusted.  Please request your Prim.MD to go over all Hospital Tests and Procedure/Radiological results at the follow up, please get all Hospital records sent to your Prim MD by signing hospital release before you go home.  If you experience worsening of your admission symptoms, develop shortness of breath, life threatening emergency, suicidal or homicidal thoughts you must seek medical attention immediately by calling 911 or calling your MD immediately  if symptoms less severe.  You Must read complete instructions/literature along with all the possible adverse reactions/side effects for all the Medicines you take and that have been prescribed to you. Take any new Medicines after you have completely understood and accpet all the possible adverse reactions/side effects.

## 2022-07-13 NOTE — Progress Notes (Signed)
Physical Therapy Treatment Patient Details Name: Jeremy Douglas MRN: [id number] DOB: 06-16-1956 Today's Date: 07/13/2022   History of Present Illness pt is a 66 y/o admited 7/17 to ED via EMS due to complaints of SOB.  Work up revealed Acute respiratory failure with hypoxia with COPD exacerbation and acute on chronic diastolic CHF.  PMHx: COPD, dCHF, PE, OSA on CPAP, CAD, alcohol abuse, HTN.    PT Comments    Pt much improved though has not mobilized at mod I level as of this note.  Emphasis on gait stability/stamina and speed with safe negotiation of stairs.    Recommendations for follow up therapy are one component of a multi-disciplinary discharge planning process, led by the attending physician.  Recommendations may be updated based on patient status, additional functional criteria and insurance authorization.  Follow Up Recommendations  Home health PT     Assistance Recommended at Discharge Intermittent Supervision/Assistance  Patient can return home with the following Assistance with cooking/housework;Assist for transportation;Help with stairs or ramp for entrance   Equipment Recommendations  None recommended by PT    Recommendations for Other Services       Precautions / Restrictions Precautions Precautions: Fall (low fall risk now.) Restrictions Weight Bearing Restrictions: No     Mobility  Bed Mobility               General bed mobility comments: EOB on arrival    Transfers Overall transfer level: Needs assistance   Transfers: Sit to/from Stand Sit to Stand: Modified independent (Device/Increase time)                Ambulation/Gait Ambulation/Gait assistance: Supervision Gait Distance (Feet): 300 Feet Assistive device: None Gait Pattern/deviations: Step-through pattern   Gait velocity interpretation: 1.31 - 2.62 ft/sec, indicative of limited community ambulator   General Gait Details: generally steady, able to vary speeds and can scan  without deviation.   Stairs Stairs: Yes Stairs assistance: Supervision Stair Management: One rail Right, Alternating pattern, Forwards Number of Stairs: 6 General stair comments: safe with the rail   Wheelchair Mobility    Modified Rankin (Stroke Patients Only)       Balance Overall balance assessment: No apparent balance deficits (not formally assessed)                                          Cognition Arousal/Alertness: Awake/alert Behavior During Therapy: WFL for tasks assessed/performed, Agitated Overall Cognitive Status: Within Functional Limits for tasks assessed                                          Exercises      General Comments General comments (skin integrity, edema, etc.): vss on 3L, SpO2 89/90% until back resting on bedside, sats dropped to 87%.  Discussed 3L for minimal activity and 4L for moderate exertion at thist time.      Pertinent Vitals/Pain Pain Assessment Pain Assessment: No/denies pain Pain Intervention(s): Monitored during session    Home Living                          Prior Function            PT Goals (current goals can now be found in the care plan  section) Acute Rehab PT Goals PT Goal Formulation: With patient Time For Goal Achievement: 07/25/22 Potential to Achieve Goals: Good Progress towards PT goals: Progressing toward goals    Frequency    Min 3X/week      PT Plan Current plan remains appropriate    Co-evaluation              AM-PAC PT "6 Clicks" Mobility   Outcome Measure  Help needed turning from your back to your side while in a flat bed without using bedrails?: A Little Help needed moving from lying on your back to sitting on the side of a flat bed without using bedrails?: A Little Help needed moving to and from a bed to a chair (including a wheelchair)?: A Little Help needed standing up from a chair using your arms (e.g., wheelchair or bedside  chair)?: A Little Help needed to walk in hospital room?: A Little Help needed climbing 3-5 steps with a railing? : A Little 6 Click Score: 18    End of Session Equipment Utilized During Treatment: Oxygen Activity Tolerance: Patient tolerated treatment well Patient left: in bed;with call bell/phone within reach Nurse Communication: Mobility status PT Visit Diagnosis: Difficulty in walking, not elsewhere classified (R26.2)     Time: 3202-3343 PT Time Calculation (min) (ACUTE ONLY): 20 min  Charges:  $Gait Training: 8-22 mins                     07/13/2022  Jacinto Halim., PT Acute Rehabilitation Services [phone]  (pager) [phone]  (office)   Eliseo Gum Nigel Wessman 07/13/2022, 3:35 PM

## 2022-07-13 NOTE — Discharge Summary (Signed)
BRADIE LACOCK LAG:[id number] DOB: Sep 25, 1956 DOA: 07/09/2022  [facility]  Admit date: 07/09/2022  Discharge date: 07/13/2022  Admitted From: Home   Disposition:  Home   Recommendations for Outpatient Follow-up:   Follow up with PCP in 1-2 weeks  PCP Please obtain BMP/CBC, 2 view CXR in 1week,  (see Discharge instructions)   PCP Please follow up on the following pending results: Kindly check blood pressure, CBC, CMP, magnesium and a two-view chest x-ray in 7 to 10 days.  Needs close outpatient cardiology and pulmonary follow-up.   Home Health: None   Equipment/Devices: o2 3lits  Consultations: PCCM Discharge Condition: Stable    CODE STATUS: Full    Diet Recommendation: Heart Healthy, low carbohydrate diet with 1.5 L fluid restriction  Diet Order             Diet Heart Room service appropriate? Yes; Fluid consistency: Thin; Fluid restriction: 1500 mL Fluid  Diet effective now                    Chief Complaint  Patient presents with   Shortness of Breath        Chest Pain     Brief history of present illness from the day of admission and additional interim summary    66 year old male with history of COPD, diastolic congestive heart failure (Echo 11/2018 EF 55-60% with G2DD), pulmonary embolism on Eliquis, obstructive sleep apnea on CPAP, coronary artery disease, alcohol abuse, hypertension who presents to [facility] with chest pain, shortness of breath and extremely high blood pressure.  He is also been drinking a lot of alcohol for the last few days.  In the ER he was diagnosed with acute hypoxic respiratory failure due to CHF and COPD exacerbation along with hypertensive urgency and admitted to the hospital.                                                                  Hospital Course   Acute respiratory failure with hypoxia ([place]) -  Patient suffering from acute hypoxic respiratory failure, likely multifactorial in origin but large component of acute on chronic diastolic CHF last EF 12% in 2019.  Also has hypertensive urgency and some element of COPD exacerbation.  There is suspicion for noncompliance with medications, diet and excessive alcohol use.  Patient extensively counseled.  He was in ICU now much improved, has been diuresed, continue combination of IV Lasix along with Coreg, Norvasc, hydralazine, Imdur, ARB for diuresis and good blood pressure control, he currently is on 3 L nasal cannula oxygen along with his nighttime CPAP for OSA, he is symptom-free, exam is much improved with no crackles or peripheral edema, he is adamant to be discharged today does not want to stay in the hospital  anymore as he was signed out AMA.  Will discharge him on 3 L nasal cannula home oxygen with above dictated blood pressure medications, home dose diuretics with addition of Aldactone, strictly counseled on adherence to blood pressure medication regimen along with fluid restriction, low-sodium diet and abstaining from alcohol.  Requested him to follow-up with his PCP and primary cardiologist within a week.     COPD exacerbation ([place])  - Please see assessment and plan above, resolved mild exacerbation off of steroids.  Oxygen as above home nebulizers continue rescue inhaler provided.  Counseled follow-up with PCP within a week.   Acute on chronic diastolic CHF (congestive heart failure) ([place]) - as in #1 above.   Alcohol withdrawal with complication with inpatient treatment, with unspecified complication - in ICU placed on phenobarb taper, stable on it, he claims he drank for 2 days before he came to the hospital and had not had a drink for several weeks before that, should not technically be going into DTs.  Currently no signs of DTs is adamant to be discharged, counseled again not  to go back to drinking.  PCP to monitor closely   Essential hypertension with hypertensive emergency.  Blood pressure medications have been adjusted, currently on combination of Coreg, Catapres, losartan, hydralazine, diuretics he needed clevidipine drip in ICU.  Blood pressure medications adjusted currently on oral medications, blood pressure much improved request PCP to monitor.  Monitor adherence with drug regimen   History of pulmonary embolism  Continue home regimen of Eliquis, CTA and lower extremity venous duplex unremarkable.   Mixed hyperlipidemia - Continuing home regimen of lipid lowering therapy.   Obstructive sleep apnea - Continue home regimen of CPAP nightly,     CKD 3B.  Baseline creatinine appears to be close to 1.6.  Stable here PCP to continue to monitor in the outpatient setting.     Discharge diagnosis     Principal Problem:   Acute respiratory failure with hypoxia (HCC) Active Problems:   COPD exacerbation (HCC)   Acute on chronic diastolic CHF (congestive heart failure) (HCC)   Alcohol withdrawal with complication with inpatient treatment, with unspecified complication (HCC)   Elevated troponin level not due myocardial infarction   Essential hypertension   History of pulmonary embolism   Mixed hyperlipidemia   Obstructive sleep apnea   COPD (chronic obstructive pulmonary disease) ([place])   Hypertensive emergency    Discharge instructions    Discharge Instructions     Discharge instructions   Complete by: As directed    Follow with Primary [place] and your cardiologist in 7 days   Get CBC, CMP, magnesium, 2 view Chest X ray -  checked next visit within 1 week by Primary MD    Activity: As tolerated with Full fall precautions use walker/cane & assistance as needed  Disposition Home   Diet: Heart Healthy - Heart Healthy, low carbohydrate diet with 1.5 L fluid restriction.  Check CBGs q. [place].  Check your Weight same time everyday, if  you gain over 2 pounds, or you develop in leg swelling, experience more shortness of breath or chest pain, call your Primary MD immediately. Follow Cardiac Low Salt Diet and 1.5 lit/day fluid restriction.  Special Instructions: If you have smoked or chewed Tobacco  in the last 2 yrs please stop smoking, stop any regular Alcohol  and or any Recreational drug use.  On your next visit with your primary care physician please Get Medicines reviewed and adjusted.  Please request your Prim.MD to go over all Hospital Tests and Procedure/Radiological results at the follow up, please get all Hospital records sent to your Prim MD by signing hospital release before you go home.  If you experience worsening of your admission symptoms, develop shortness of breath, life threatening emergency, suicidal or homicidal thoughts you must seek medical attention immediately by calling 911 or calling your MD immediately  if symptoms less severe.  You Must read complete instructions/literature along with all the possible adverse reactions/side effects for all the Medicines you take and that have been prescribed to you. Take any new Medicines after you have completely understood and accpet all the possible adverse reactions/side effects.   Increase activity slowly   Complete by: As directed        Discharge Medications   Allergies as of 07/13/2022       Reactions   Amlodipine Swelling   Pt cannot tolerate 51m        Medication List     STOP taking these medications    budesonide-formoterol 160-4.5 MCG/ACT inhaler Commonly known as: SYMBICORT   labetalol 100 MG tablet Commonly known as: NORMODYNE       TAKE these medications    albuterol 108 (90 Base) MCG/ACT inhaler Commonly known as: VENTOLIN HFA Inhale 2 puffs into the lungs every 6 (six) hours as needed for wheezing or shortness of breath.   amLODipine 10 MG tablet Commonly known as: NORVASC Take 1 tablet (10 mg total) by mouth daily.    atorvastatin 20 MG tablet Commonly known as: LIPITOR Take 1 tablet (20 mg total) by mouth daily at 6 PM. What changed:  how much to take when to take this   carvedilol 6.25 MG tablet Commonly known as: COREG Take 1 tablet (6.25 mg total) by mouth 2 (two) times daily with a meal.   cloNIDine 0.3 MG tablet Commonly known as: CATAPRES Take 1 tablet (0.3 mg total) by mouth 3 (three) times daily. What changed:  medication strength how much to take when to take this   DULoxetine 60 MG capsule Commonly known as: CYMBALTA Take 60 mg by mouth daily. for pain   Eliquis 5 MG Tabs tablet Generic drug: apixaban TAKE 1 TABLET BY MOUTH TWICE DAILY What changed: how much to take   ferrous sulfate 325 (65 FE) MG tablet Take 1 tablet (325 mg total) by mouth daily with breakfast. What changed: when to take this   fluticasone-salmeterol 250-50 MCG/ACT Aepb Commonly known as: ADVAIR Inhale 1 puff into the lungs daily as needed (for shortness of breath).   folic acid 1 MG tablet Commonly known as: FOLVITE Take 1 tablet (1 mg total) by mouth daily.   furosemide 40 MG tablet Commonly known as: LASIX Take 80 mg by mouth 2 (two) times daily.   glimepiride 2 MG tablet Commonly known as: AMARYL Take 2 mg by mouth daily with breakfast.   hydrALAZINE 100 MG tablet Commonly known as: APRESOLINE Take 1 tablet (100 mg total) by mouth 3 (three) times daily. What changed: when to take this   isosorbide mononitrate 60 MG 24 hr tablet Commonly known as: IMDUR Take 1 tablet (60 mg total) by mouth daily.   losartan 50 MG tablet Commonly known as: COZAAR Take 0.5 tablets (25 mg total) by mouth daily.   melatonin 5 MG Tabs Take 5 mg by mouth at bedtime as needed (for sleep).   multivitamin with minerals Tabs tablet Take 1 tablet by mouth daily.  naloxone 4 MG/0.1ML Liqd nasal spray kit Commonly known as: NARCAN Place 1 spray into the nose once as needed.   OVER THE COUNTER  MEDICATION CPAP   oxyCODONE-acetaminophen 10-325 MG tablet Commonly known as: PERCOCET Take 1 tablet by mouth every 8 (eight) hours as needed for pain (knees).   polyethylene glycol 17 g packet Commonly known as: MIRALAX / GLYCOLAX Take 17 g by mouth daily as needed for moderate constipation or severe constipation.   spironolactone 25 MG tablet Commonly known as: Aldactone Take 1 tablet (25 mg total) by mouth daily.   thiamine 100 MG tablet Take 1 tablet (100 mg total) by mouth daily.   tiZANidine 4 MG tablet Commonly known as: ZANAFLEX Take 4 mg by mouth at bedtime as needed for muscle spasms.               Durable Medical Equipment  (From admission, onward)           Start     Ordered   07/13/22 0851  For home use only DME oxygen  Once       Question Answer Comment  Length of Need 6 Months   Mode or (Route) Nasal cannula   Liters per Minute 3   Frequency Continuous (stationary and portable oxygen unit needed)   Oxygen conserving device Yes   Oxygen delivery system Gas      07/13/22 [facility]. Schedule an appointment as soon as possible for a visit in 1 week(s).   Specialty: General Practice Contact information: [place] [id number] [phone]         Lorretta Harp, MD. Schedule an appointment as soon as possible for a visit in 1 week(s).   Specialties: Cardiology, Radiology Contact information: [address] [place] [address] [phone]                 Major procedures and Radiology Reports - PLEASE review detailed and final reports thoroughly  -       DG Chest Port 1 View  Result Date: 07/13/2022 CLINICAL DATA:  Shortness of breath. EXAM: PORTABLE CHEST 1 VIEW COMPARISON:  Chest x-ray 07/12/2022 FINDINGS: No consolidation. Subtle interstitial prominence. No visible pleural effusions or pneumothorax. Cardiomediastinal silhouette  is within normal limits. No acute osseous abnormality. IMPRESSION: Subtle interstitial prominence, which could represent mild interstitial edema (in the correct clinical setting) versus the sequela of recurrent bouts of CHF. Electronically Signed   By: Margaretha Sheffield M.D.   On: 07/13/2022 08:07   DG Chest Port 1 View  Result Date: 07/12/2022 CLINICAL DATA:  Shortness of breath. EXAM: PORTABLE CHEST 1 VIEW COMPARISON:  07/11/2022 FINDINGS: 0618 hours. The cardio pericardial silhouette is enlarged. Interval increase in diffuse interstitial opacity suggests edema with associated basilar atelectasis. Telemetry leads overlie the chest. IMPRESSION: Interval increase in diffuse interstitial opacity suggesting pulmonary edema. Electronically Signed   By: Misty Stanley M.D.   On: 07/12/2022 06:38   DG Chest Port 1 View  Result Date: 07/11/2022 CLINICAL DATA:  Provided history: Shortness of breath and chest pain. History of respiratory failure. EXAM: PORTABLE CHEST 1 VIEW COMPARISON:  Prior chest radiographs 07/10/2022 and earlier. CT angiogram chest 07/09/2022. FINDINGS: Heart size at the upper limits of normal. Aortic atherosclerosis. Emphysema, better appreciated on the recent prior chest CT of 07/09/2022. Chronic prominence of the interstitial lung markings.  Minimal ill-defined opacity within the left lung base. No evidence of pleural effusion or pneumothorax. No acute bony abnormality identified. Degenerative changes of the spine. IMPRESSION: Minimal ill-defined opacity within the left lung base, with an appearance favoring atelectasis. Aortic Atherosclerosis (ICD10-I70.0) and Emphysema (ICD10-J43.9). Electronically Signed   By: Kellie Simmering D.O.   On: 07/11/2022 07:21   VAS [place] LOWER EXTREMITY VENOUS (DVT)  Result Date: 07/10/2022  Lower Venous DVT Study Patient Name:  SHAHIEM BEDWELL  Date of Exam:   07/10/2022 Medical Rec #: [id number]         Accession #:    [id number] Date of Birth: Jul 15, 1956          Patient Gender: M Patient Age:   55 years Exam Location:  [facility] Procedure:      VAS [place] LOWER EXTREMITY VENOUS (DVT) Referring Phys: Deno Etienne [facility] --------------------------------------------------------------------------------  Indications: Swelling, and Elevated Ddimer.  Risk Factors: None identified. Limitations: Patient positioning and poor ultrasound/tissue interface. Comparison Study: No prior studies. Performing Technologist: Oliver Hum RVT  Examination Guidelines: A complete evaluation includes B-mode imaging, spectral Doppler, color Doppler, and power Doppler as needed of all accessible portions of each vessel. Bilateral testing is considered an integral part of a complete examination. Limited examinations for reoccurring indications may be performed as noted. The reflux portion of the exam is performed with the patient in reverse Trendelenburg.  +---------+---------------+---------+-----------+----------+--------------+ RIGHT    CompressibilityPhasicitySpontaneityPropertiesThrombus Aging +---------+---------------+---------+-----------+----------+--------------+ CFV      Full           Yes      Yes                                 +---------+---------------+---------+-----------+----------+--------------+ SFJ      Full                                                        +---------+---------------+---------+-----------+----------+--------------+ FV Prox  Full                                                        +---------+---------------+---------+-----------+----------+--------------+ FV Mid   Full                                                        +---------+---------------+---------+-----------+----------+--------------+ FV DistalFull                                                        +---------+---------------+---------+-----------+----------+--------------+ PFV      Full                                                         +---------+---------------+---------+-----------+----------+--------------+  POP      Full           Yes      Yes                                 +---------+---------------+---------+-----------+----------+--------------+ PTV      Full                                                        +---------+---------------+---------+-----------+----------+--------------+ PERO     Full                                                        +---------+---------------+---------+-----------+----------+--------------+   +---------+---------------+---------+-----------+----------+--------------+ LEFT     CompressibilityPhasicitySpontaneityPropertiesThrombus Aging +---------+---------------+---------+-----------+----------+--------------+ CFV      Full           Yes      Yes                                 +---------+---------------+---------+-----------+----------+--------------+ SFJ      Full                                                        +---------+---------------+---------+-----------+----------+--------------+ FV Prox  Full                                                        +---------+---------------+---------+-----------+----------+--------------+ FV Mid   Full                                                        +---------+---------------+---------+-----------+----------+--------------+ FV Distal               Yes      Yes                                 +---------+---------------+---------+-----------+----------+--------------+ PFV      Full                                                        +---------+---------------+---------+-----------+----------+--------------+ POP      Full           Yes      Yes                                 +---------+---------------+---------+-----------+----------+--------------+  PTV      Full                                                         +---------+---------------+---------+-----------+----------+--------------+ PERO     Full                                                        +---------+---------------+---------+-----------+----------+--------------+     Summary: RIGHT: - There is no evidence of deep vein thrombosis in the lower extremity. However, portions of this examination were limited- see technologist comments above.  - No cystic structure found in the popliteal fossa.  LEFT: - There is no evidence of deep vein thrombosis in the lower extremity. However, portions of this examination were limited- see technologist comments above.  - No cystic structure found in the popliteal fossa.  *See table(s) above for measurements and observations. Electronically signed by Deitra Mayo MD on 07/10/2022 at 6:30:05 PM.    Final    ECHOCARDIOGRAM COMPLETE  Result Date: 07/10/2022    ECHOCARDIOGRAM REPORT   Patient Name:   EMERICK WEATHERLY Date of Exam: 07/10/2022 Medical Rec #:  [id number]        Height:       70.0 in Accession #:    [id number]       Weight:       265.0 lb Date of Birth:  1956-03-07        BSA:          2.353 m Patient Age:    54 years         BP:           162/65 mmHg Patient Gender: M                HR:           69 bpm. Exam Location:  Inpatient Procedure: 2D Echo, Cardiac Doppler and Color Doppler Indications:    CHF-Acute Systolic E42.35  History:        Patient has prior history of Echocardiogram examinations, most                 recent 12/03/2018. COPD exacerbation; Risk Factors:Sleep Apnea,                 Hypertension and Dyslipidemia. History of pulmonary embolism.                 Chronic kidney disease.  Sonographer:    Darlina Sicilian RDCS Referring Phys: Vernelle Emerald IMPRESSIONS  1. Left ventricular ejection fraction, by estimation, is 60 to 65%. The left ventricle has normal function. The left ventricle has no regional wall motion abnormalities. Left ventricular diastolic parameters are consistent with  Grade II diastolic dysfunction (pseudonormalization).  2. Right ventricular systolic function is normal. The right ventricular size is normal. Tricuspid regurgitation signal is inadequate for assessing PA pressure.  3. Left atrial size was severely dilated.  4. Right atrial size was mildly dilated.  5. The mitral valve is grossly normal. No evidence of mitral valve regurgitation. Moderate mitral annular calcification.  6. The aortic valve is tricuspid. There is moderate calcification  of the aortic valve. There is moderate thickening of the aortic valve. Aortic valve regurgitation is not visualized. Mild aortic valve stenosis.  7. The inferior vena cava is normal in size with greater than 50% respiratory variability, suggesting right atrial pressure of 3 mmHg. FINDINGS  Left Ventricle: Left ventricular ejection fraction, by estimation, is 60 to 65%. The left ventricle has normal function. The left ventricle has no regional wall motion abnormalities. The left ventricular internal cavity size was normal in size. There is  no left ventricular hypertrophy. Left ventricular diastolic parameters are consistent with Grade II diastolic dysfunction (pseudonormalization). Right Ventricle: The right ventricular size is normal. No increase in right ventricular wall thickness. Right ventricular systolic function is normal. Tricuspid regurgitation signal is inadequate for assessing PA pressure. Left Atrium: Left atrial size was severely dilated. Right Atrium: Right atrial size was mildly dilated. Pericardium: There is no evidence of pericardial effusion. Mitral Valve: The mitral valve is grossly normal. Moderate mitral annular calcification. No evidence of mitral valve regurgitation. Tricuspid Valve: The tricuspid valve is grossly normal. Tricuspid valve regurgitation is not demonstrated. No evidence of tricuspid stenosis. Aortic Valve: The aortic valve is tricuspid. There is moderate calcification of the aortic valve. There is  moderate thickening of the aortic valve. Aortic valve regurgitation is not visualized. Mild aortic stenosis is present. Aortic valve mean gradient measures 12.5 mmHg. Aortic valve peak gradient measures 24.0 mmHg. Aortic valve area, by VTI measures 2.49 cm. Pulmonic Valve: The pulmonic valve was grossly normal. Pulmonic valve regurgitation is not visualized. No evidence of pulmonic stenosis. Aorta: The aortic root and ascending aorta are structurally normal, with no evidence of dilitation. Venous: The right lower pulmonary vein is normal. The inferior vena cava is normal in size with greater than 50% respiratory variability, suggesting right atrial pressure of 3 mmHg. IAS/Shunts: The atrial septum is grossly normal.  LEFT VENTRICLE PLAX 2D LVIDd:         5.40 cm   Diastology LVIDs:         3.30 cm   LV e' medial:    6.96 cm/s LV PW:         1.10 cm   LV E/e' medial:  16.4 LV IVS:        1.20 cm   LV e' lateral:   7.62 cm/s LVOT diam:     2.20 cm   LV E/e' lateral: 15.0 LV SV:         136 LV SV Index:   58 LVOT Area:     3.80 cm  RIGHT VENTRICLE RV Basal diam:  4.90 cm RV Mid diam:    4.00 cm RV S prime:     24.00 cm/s TAPSE (M-mode): 3.1 cm LEFT ATRIUM              Index        RIGHT ATRIUM           Index LA diam:        5.60 cm  2.38 cm/m   RA Area:     15.20 cm LA Vol (A2C):   135.5 ml 57.60 ml/m  RA Volume:   35.90 ml  15.26 ml/m LA Vol (A4C):   129.5 ml 55.05 ml/m LA Biplane Vol: 127.0 ml 53.98 ml/m  AORTIC VALVE AV Area (Vmax):    2.28 cm AV Area (Vmean):   2.36 cm AV Area (VTI):     2.49 cm AV Vmax:  245.00 cm/s AV Vmean:          163.000 cm/s AV VTI:            0.546 m AV Peak Grad:      24.0 mmHg AV Mean Grad:      12.5 mmHg LVOT Vmax:         147.00 cm/s LVOT Vmean:        101.000 cm/s LVOT VTI:          0.357 m LVOT/AV VTI ratio: 0.65  AORTA Ao Root diam: 3.50 cm Ao Asc diam:  3.50 cm MITRAL VALVE MV Area (PHT): 1.90 cm     SHUNTS MV Decel Time: 400 msec     Systemic VTI:  0.36 m MV E  velocity: 114.00 cm/s  Systemic Diam: 2.20 cm MV A velocity: 135.00 cm/s MV E/A ratio:  0.84 Eleonore Chiquito MD Electronically signed by Eleonore Chiquito MD Signature Date/Time: 07/10/2022/2:44:40 PM    Final    DG Chest Port 1 View  Result Date: 07/10/2022 CLINICAL DATA:  Shortness of breath. Respiratory failure with hypoxia. EXAM: PORTABLE CHEST 1 VIEW COMPARISON:  07/09/2022 FINDINGS: Artifact from EKG leads. Generalized interstitial coarsening likely from the patient's emphysema. Borderline heart size is stable. There is no edema, consolidation, effusion, or pneumothorax. IMPRESSION: COPD without acute superimposed finding. Electronically Signed   By: Jorje Guild M.D.   On: 07/10/2022 07:17   US Abdomen Limited RUQ (LIVER/GB)  Result Date: 07/10/2022 CLINICAL DATA:  Cirrhosis. EXAM: ULTRASOUND ABDOMEN LIMITED RIGHT UPPER QUADRANT COMPARISON:  Several chest CTs dating back to 03/14/2018. FINDINGS: Gallbladder: No gallstones or wall thickening visualized. No sonographic Murphy sign noted by sonographer. Common bile duct: Diameter: 4 mm Liver: There is severe diffuse increased liver echogenicity most commonly seen in the setting of fatty infiltration. Superimposed inflammation or fibrosis is not excluded. Clinical correlation is recommended. A 1.2 x 2.1 x 1.7 cm hypoechoic lesion in the left medial liver is not characterized but may represent a focus of fat spurring or an atypical hemangioma. No lesion was seen on the CTs. Further characterization with MRI or follow-up with ultrasound in 3 months recommended. Portal vein is patent on color Doppler imaging with normal direction of blood flow towards the liver. Other: None. IMPRESSION: 1. Severe fatty liver 2. Probable focus of fat spurring or atypical hemangioma in the left lobe of the liver. Follow-up with ultrasound in 3 months or further characterization with MRI recommended. Electronically Signed   By: Anner Crete M.D.   On: 07/10/2022 00:59   CT  Angio Chest PE W and/or Wo Contrast  Result Date: 07/09/2022 CLINICAL DATA:  Shortness of breath and chest pain EXAM: CT ANGIOGRAPHY CHEST WITH CONTRAST TECHNIQUE: Multidetector CT imaging of the chest was performed using the standard protocol during bolus administration of intravenous contrast. Multiplanar CT image reconstructions and MIPs were obtained to evaluate the vascular anatomy. RADIATION DOSE REDUCTION: This exam was performed according to the departmental dose-optimization program which includes automated exposure control, adjustment of the mA and/or kV according to patient size and/or use of iterative reconstruction technique. CONTRAST:  151m OMNIPAQUE IOHEXOL 350 MG/ML SOLN COMPARISON:  Chest x-ray 07/09/2022, CT chest 12/03/2018 FINDINGS: Cardiovascular: Satisfactory opacification of the pulmonary arteries to the segmental level. No evidence of pulmonary embolism. Moderate aortic atherosclerosis. No aneurysm. Cardiomegaly. No pericardial effusion Mediastinum/Nodes: Midline trachea. No thyroid mass. Borderline mediastinal lymph nodes, AP window lymph node measures 11 mm. Lymphadenopathy has decreased since 2019 comparison. Esophagus within  normal limits. Lungs/Pleura: Emphysema. No consolidation, pleural effusion, or pneumothorax Upper Abdomen: Probable hepatic steatosis.  No acute abnormality Musculoskeletal: No chest wall abnormality. No acute or significant osseous findings. Review of the MIP images confirms the above findings. IMPRESSION: 1. Negative for acute pulmonary embolus. 2. Emphysema without acute airspace disease 3. Cardiomegaly Aortic Atherosclerosis (ICD10-I70.0) and Emphysema (ICD10-J43.9). Electronically Signed   By: Donavan Foil M.D.   On: 07/09/2022 21:40   DG Chest 2 View  Result Date: 07/09/2022 CLINICAL DATA:  Shortness of breath EXAM: CHEST - 2 VIEW COMPARISON:  12/04/2018 FINDINGS: Cardiomegaly with mild central congestion. No acute airspace disease, pleural effusion, or  pneumothorax. Aortic atherosclerosis. IMPRESSION: Cardiomegaly with mild central congestion Electronically Signed   By: Donavan Foil M.D.   On: 07/09/2022 16:25    Today   Subjective    Llewyn Heap today has no headache,no chest abdominal pain,no new weakness tingling or numbness, feels much better, he wants to be discharged right away or else he will sign out AMA   Objective   Blood pressure (!) 168/78, pulse (!) 57, temperature 98.1 F (36.7 C), temperature source Oral, resp. rate 19, height 5' 10"  (1.778 m), weight 126.8 kg, SpO2 92 %.   Intake/Output Summary (Last 24 hours) at 07/13/2022 0901 Last data filed at 07/13/2022 0700 Gross per 24 hour  Intake 510 ml  Output 1445 ml  Net -935 ml    Exam  Awake Alert, No new F.N deficits,    [place].AT,PERRAL Supple Neck,   Symmetrical Chest wall movement, Good air movement bilaterally, CTAB RRR,No Gallops,   +ve B.Sounds, Abd Soft, Non tender,  No Cyanosis, Clubbing or edema    Data Review   Recent Labs  Lab 07/09/22 1628 07/09/22 2321 07/10/22 0533 07/10/22 1115 07/11/22 0636 07/12/22 0122 07/13/22 0128  WBC 8.4  --  7.9  --  16.2* 16.6* 12.1*  HGB 11.8*   < > 12.3* 13.3 11.2* 11.2* 10.4*  HCT 37.6*   < > 38.6* 39.0 34.4* 35.2* 32.3*  PLT 138*  --  130*  --  123* 122* 105*  MCV 62.6*  --  61.9*  --  60.8* 61.6* 61.5*  MCH 19.6*  --  19.7*  --  19.8* 19.6* 19.8*  MCHC 31.4  --  31.9  --  32.6 31.8 32.2  RDW 18.0*  --  18.0*  --  17.9* 18.1* 17.4*  LYMPHSABS 1.7  --  0.6*  --  0.5* 1.0 2.1  MONOABS 0.6  --  0.0*  --  0.0* 0.8 0.9  EOSABS 0.1  --  0.0  --  0.0 0.0 0.1  BASOSABS 0.1  --  0.0  --  0.0 0.0 0.0   < > = values in this interval not displayed.    Recent Labs  Lab 07/09/22 1628 07/09/22 2321 07/10/22 0533 07/10/22 1115 07/11/22 0636 07/12/22 0122 07/13/22 0128  NA 139   < > 139 137 135 136 136  K 3.8   < > 3.9 4.0 3.6 4.1 3.4*  CL 108  --  105  --  103 101 100  CO2 16*  --  19*  --  23 25 30    GLUCOSE 94  --  156*  --  183* 143* 98  BUN 21  --  24*  --  35* 39* 40*  CREATININE 1.32*  --  1.45*  --  1.46* 1.53* 1.43*  CALCIUM 7.7*  --  8.2*  --  8.6* 8.8* 8.3*  AST 54*  --  50*  --  35 36 26  ALT 33  --  35  --  29 31 29   ALKPHOS 135*  --  147*  --  117 108 97  BILITOT 1.2  --  1.9*  --  1.2 1.3* 1.0  ALBUMIN 3.0*  --  3.2*  --  3.0* 3.0* 2.8*  MG  --   --  1.7  --  2.1 1.9 2.0  DDIMER 2.82*  --   --   --   --   --   --   PROCALCITON  --   --   --   --   --  <0.10 <0.10  HGBA1C  --   --   --   --  5.5  --   --   BNP 144.5*  --   --   --  210.5* 178.7* 117.1*   < > = values in this interval not displayed.    Total Time in preparing paper work, data evaluation and todays exam - 78 minutes  Lala Lund M.D on 07/13/2022 at 9:01 AM  [facility]

## 2022-07-13 NOTE — Plan of Care (Signed)
Problem: Education: Goal: Ability to demonstrate management of disease process will improve Outcome: Adequate for Discharge Goal: Ability to verbalize understanding of medication therapies will improve Outcome: Adequate for Discharge Goal: Individualized Educational Video(s) Outcome: Adequate for Discharge   Problem: Activity: Goal: Capacity to carry out activities will improve Outcome: Adequate for Discharge   Problem: Cardiac: Goal: Ability to achieve and maintain adequate cardiopulmonary perfusion will improve Outcome: Adequate for Discharge   Problem: Education: Goal: Knowledge of disease or condition will improve Outcome: Adequate for Discharge Goal: Knowledge of the prescribed therapeutic regimen will improve Outcome: Adequate for Discharge Goal: Individualized Educational Video(s) Outcome: Adequate for Discharge   Problem: Activity: Goal: Ability to tolerate increased activity will improve Outcome: Adequate for Discharge Goal: Will verbalize the importance of balancing activity with adequate rest periods Outcome: Adequate for Discharge   Problem: Respiratory: Goal: Ability to maintain a clear airway will improve Outcome: Adequate for Discharge Goal: Levels of oxygenation will improve Outcome: Adequate for Discharge Goal: Ability to maintain adequate ventilation will improve Outcome: Adequate for Discharge   Problem: Education: Goal: Knowledge of General Education information will improve Description: Including pain rating scale, medication(s)/side effects and non-pharmacologic comfort measures Outcome: Adequate for Discharge   Problem: Health Behavior/Discharge Planning: Goal: Ability to manage health-related needs will improve Outcome: Adequate for Discharge   Problem: Clinical Measurements: Goal: Ability to maintain clinical measurements within normal limits will improve Outcome: Adequate for Discharge Goal: Will remain free from infection Outcome:  Adequate for Discharge Goal: Diagnostic test results will improve Outcome: Adequate for Discharge Goal: Respiratory complications will improve Outcome: Adequate for Discharge Goal: Cardiovascular complication will be avoided Outcome: Adequate for Discharge   Problem: Activity: Goal: Risk for activity intolerance will decrease Outcome: Adequate for Discharge   Problem: Nutrition: Goal: Adequate nutrition will be maintained Outcome: Adequate for Discharge   Problem: Coping: Goal: Level of anxiety will decrease Outcome: Adequate for Discharge   Problem: Elimination: Goal: Will not experience complications related to bowel motility Outcome: Adequate for Discharge Goal: Will not experience complications related to urinary retention Outcome: Adequate for Discharge   Problem: Pain Managment: Goal: General experience of comfort will improve Outcome: Adequate for Discharge   Problem: Safety: Goal: Ability to remain free from injury will improve Outcome: Adequate for Discharge   Problem: Skin Integrity: Goal: Risk for impaired skin integrity will decrease Outcome: Adequate for Discharge   Problem: Education: Goal: Ability to describe self-care measures that may prevent or decrease complications (Diabetes Survival Skills Education) will improve Outcome: Adequate for Discharge Goal: Individualized Educational Video(s) Outcome: Adequate for Discharge   Problem: Coping: Goal: Ability to adjust to condition or change in health will improve Outcome: Adequate for Discharge   Problem: Fluid Volume: Goal: Ability to maintain a balanced intake and output will improve Outcome: Adequate for Discharge   Problem: Health Behavior/Discharge Planning: Goal: Ability to identify and utilize available resources and services will improve Outcome: Adequate for Discharge Goal: Ability to manage health-related needs will improve Outcome: Adequate for Discharge   Problem: Metabolic: Goal:  Ability to maintain appropriate glucose levels will improve Outcome: Adequate for Discharge   Problem: Nutritional: Goal: Maintenance of adequate nutrition will improve Outcome: Adequate for Discharge Goal: Progress toward achieving an optimal weight will improve Outcome: Adequate for Discharge   Problem: Skin Integrity: Goal: Risk for impaired skin integrity will decrease Outcome: Adequate for Discharge   Problem: Tissue Perfusion: Goal: Adequacy of tissue perfusion will improve Outcome: Adequate for Discharge   Problem: Education:  Goal: Ability to describe self-care measures that may prevent or decrease complications (Diabetes Survival Skills Education) will improve Outcome: Adequate for Discharge Goal: Individualized Educational Video(s) Outcome: Adequate for Discharge   Problem: Coping: Goal: Ability to adjust to condition or change in health will improve Outcome: Adequate for Discharge   Problem: Fluid Volume: Goal: Ability to maintain a balanced intake and output will improve Outcome: Adequate for Discharge   Problem: Health Behavior/Discharge Planning: Goal: Ability to identify and utilize available resources and services will improve Outcome: Adequate for Discharge Goal: Ability to manage health-related needs will improve Outcome: Adequate for Discharge   Problem: Metabolic: Goal: Ability to maintain appropriate glucose levels will improve Outcome: Adequate for Discharge

## 2022-07-13 NOTE — Progress Notes (Signed)
SATURATION QUALIFICATIONS: (This note is used to comply with regulatory documentation for home oxygen)  Patient Saturations on Room Air at Rest = 85%  Patient Saturations on Room Air while Ambulating = n/a  Patient Saturations on 4 Liters of oxygen while Ambulating = 92%  Please briefly explain why patient needs home oxygen: unable to maintain O2 saturation on room air

## 2022-07-13 NOTE — Progress Notes (Signed)
Occupational Therapy Treatment Patient Details Name: Jeremy Douglas MRN: [id number] DOB: 23-Jan-1956 Today's Date: 07/13/2022   History of present illness pt is a 66 y/o admited 7/17 to ED via EMS due to complaints of SOB.  Work up revealed Acute respiratory failure with hypoxia with COPD exacerbation and acute on chronic diastolic CHF.  PMHx: COPD, dCHF, PE, OSA on CPAP, CAD, alcohol abuse, HTN.   OT comments  Pt making good progress with OT goals. At this time, pt is limited by activity tolerance, O2 needs, and weakness. He is able to complete most ADL's with min guard to min A. Continuing to recommend HHOT to maximize independence and safety at home. OT will follow acutely.    Recommendations for follow up therapy are one component of a multi-disciplinary discharge planning process, led by the attending physician.  Recommendations may be updated based on patient status, additional functional criteria and insurance authorization.    Follow Up Recommendations  Home health OT    Assistance Recommended at Discharge Intermittent Supervision/Assistance  Patient can return home with the following  A little help with walking and/or transfers;A little help with bathing/dressing/bathroom;Assistance with cooking/housework   Equipment Recommendations  None recommended by OT    Recommendations for Other Services      Precautions / Restrictions Precautions Precautions: Fall Restrictions Weight Bearing Restrictions: No       Mobility Bed Mobility Overal bed mobility: Modified Independent             General bed mobility comments: no assist needed    Transfers Overall transfer level: Needs assistance Equipment used: Rolling walker (2 wheels) Transfers: Sit to/from Stand Sit to Stand: Min guard           General transfer comment: min guard for safety     Balance Overall balance assessment: Mild deficits observed, not formally tested                                          ADL either performed or assessed with clinical judgement   ADL Overall ADL's : Needs assistance/impaired                     Lower Body Dressing: Minimal assistance;Sitting/lateral leans Lower Body Dressing Details (indicate cue type and reason): Min A to assist with donning 1 leg of his pants on his RLE. Toilet Transfer: Min guard;Ambulation Toilet Transfer Details (indicate cue type and reason): completed into bathroom and hall as simulation to his  home Toileting- Architect and Hygiene: Min guard;Sitting/lateral lean;Sit to/from stand Toileting - Architect Details (indicate cue type and reason): no assist from toilet needed     Functional mobility during ADLs: Min guard General ADL Comments: Pt is improving with tolerance and strength to complete ADL's independently.    Extremity/Trunk Assessment              Vision       Perception     Praxis      Cognition Arousal/Alertness: Awake/alert Behavior During Therapy: WFL for tasks assessed/performed Overall Cognitive Status: Within Functional Limits for tasks assessed                                          Exercises      Shoulder  Instructions       General Comments VSS on 3L at rest and 88-90% on 4L with ambulation    Pertinent Vitals/ Pain       Pain Assessment Pain Assessment: No/denies pain  Home Living                                          Prior Functioning/Environment              Frequency  Min 2X/week        Progress Toward Goals  OT Goals(current goals can now be found in the care plan section)  Progress towards OT goals: Progressing toward goals  Acute Rehab OT Goals Patient Stated Goal: To go home OT Goal Formulation: With patient Time For Goal Achievement: 07/25/22 Potential to Achieve Goals: Good ADL Goals Pt Will Perform Grooming: with modified independence;standing Pt Will Perform  Lower Body Bathing: with modified independence;sitting/lateral leans;sit to/from stand Pt Will Perform Lower Body Dressing: with modified independence;sitting/lateral leans;sit to/from stand Pt Will Transfer to Toilet: with modified independence;ambulating Pt Will Perform Toileting - Clothing Manipulation and hygiene: with modified independence;sitting/lateral leans;sit to/from stand  Plan Discharge plan remains appropriate;Frequency remains appropriate    Co-evaluation                 AM-PAC OT "6 Clicks" Daily Activity     Outcome Measure   Help from another person eating meals?: A Little Help from another person taking care of personal grooming?: A Little Help from another person toileting, which includes using toliet, bedpan, or urinal?: A Little Help from another person bathing (including washing, rinsing, drying)?: A Little Help from another person to put on and taking off regular upper body clothing?: A Little Help from another person to put on and taking off regular lower body clothing?: A Little 6 Click Score: 18    End of Session Equipment Utilized During Treatment: Oxygen;Rolling walker (2 wheels)  OT Visit Diagnosis: Unsteadiness on feet (R26.81);Other abnormalities of gait and mobility (R26.89);Muscle weakness (generalized) (M62.81)   Activity Tolerance Patient tolerated treatment well   Patient Left in bed;with call bell/phone within reach   Nurse Communication Mobility status        Time: 5732-2025 OT Time Calculation (min): 19 min  Charges: OT General Charges $OT Visit: 1 Visit OT Treatments $Self Care/Home Management : 8-22 mins  Iseah Plouff H., OTR/L Acute Rehabilitation  Jazmin Ley Elane Bing Plume 07/13/2022, 2:19 PM

## 2022-07-13 NOTE — TOC Initial Note (Addendum)
Transition of Care [facility]) - Initial/Assessment Note    Patient Details  Name: Jeremy Douglas MRN: [id number] Date of Birth: 05-26-1956  Transition of Care [facility]) CM/SW Contact:    Jeremy Ingles, RN Phone [phone]  07/13/2022, 12:37 PM  Clinical Narrative:                 TOC following patient with high risk for readmission with home health and DME needs. CM at bedside to assess patient.patient states that he receives care at the [facility] by Dr. Jonnie Douglas. Patient is very anxious and states that he is ready to go home now. CM has explained that patient will require home O2 and that will need be delivered by the [place]. CM has called and faxed orders, qualifying note and demographic sheet to the [facility] [phone] fax. CM attempted to call several times but only able to reach a voicemail has been left. CM will have to await return call or delivery from the [place] for Home O2. Patient refused cage aide and substance abuse resources. Patient refusing home health. Unable to complete heart failure screen patient says he had never been told he has heart failure and has never been instructed on daily weights, meds for heart failure or cardiology follow up. Patient is not willing to engage in any further conversation stating that he is just ready to go home. CM has advised patient that he will not be able to discharge until his O2 arrives from the [place]. Patient states that he and his daughter can drive to the [place] to pick up the O2. CM made patient aware that he is not safe to leave the hospital without a portable tank which will be delivered by the [place] and that sometimes this is a slow process.   1412 CM spoke with Jeremy Douglas at [facility]. Per Jeremy Douglas the process for obtaining home O2 has changed a little. CM needs to fax DME order to discharge planning in order for [facility] to [facility] O2. CM faxed home O2 order, qualifying note and face sheet to [facility] DME  discharge planning.   31 CM has received confirmation of successful submission of fax to discharge planning. Will await O2 delivery.   1450 CM received confirmation from Jeremy Douglas that documentation for home O2 has been received and that she is just waiting for the consult in order to set up delivery.   1523 CM received call from Jeremy Douglas at [facility] O2 clinic. Per Jeremy Douglas the d/c planning department denies receiving O2 order information. CM has confirmed that info was faxed to the correct # [phone] and that the transmittal was successful. Per Jeremy Douglas this patient may not receive home O2 today because discharge planning does not have the fax yet and the [place] typically doesn't process after 3pm. Jeremy Douglas states that she highly doubts that patient will receive the home O2 today and in that case it will not happen over the weekend.   14 CM called daughter per nursing request. Daughter inquiring about home O2. Cm has explained to daughter that CM has been working to get home O2 set up and that right now the [place] is doubtfuil that this will happen today which means that it can not happen before Monday. Cm inquired if patietn has additional insurance to maybe get home O2 on the other insurance. Daughter states that patient dropped his insurance because he is close to being eligible for medicare.  1559 CM received called from Jeremy Douglas at the [facility] stating that she has everything she needs to process the order and O2 can be delivered this evening. [employer] will deliver portable tank to the patients room and coordinated home delivery with the patient. [name] states that she will call patient and update him.   Expected Discharge Plan: Home w Home Health Services Barriers to Discharge: Other (must enter comment) (Awaiting home O2 from [place])   Patient Goals and CMS Choice Patient states their goals for this hospitalization and ongoing recovery are:: Ready to go home  now CMS Medicare.gov Compare Post Acute Care list provided to:: Patient Choice offered to / list presented to : Patient  Expected Discharge Plan and Services Expected Discharge Plan: Home w Home Health Services In-house Referral: NA Discharge Planning Services: CM Consult Post Acute Care Choice: Home Health (patient refused) Living arrangements for the past 2 months: Apartment Expected Discharge Date: 07/13/22               DME Arranged: Oxygen DME Agency: [facility] [place] Date DME Agency Contacted: 07/13/22 Time DME Agency Contacted: 1227 Representative spoke with at DME Agency: No answer voicemail and info faxed HH Arranged: NA, Refused HH HH Agency: NA        Prior Living Arrangements/Services Living arrangements for the past 2 months: Apartment Lives with:: Self Patient language and need for interpreter reviewed:: Yes Do you feel safe going back to the place where you live?: Yes      Need for Family Participation in Patient Care: Yes (Comment) Care giver support system in place?: Yes (comment) Current home services:  (n/a)    Activities of Daily Living      Permission Sought/Granted Permission sought to share information with : Family Supports Permission granted to share information with : No              Emotional Assessment Appearance:: Appears stated age Attitude/Demeanor/Rapport: Other (comment) (ancious to leave hospital) Affect (typically observed): Anxious, Frustrated Orientation: : Oriented to Self, Oriented to Place, Oriented to  Time, Oriented to Situation Alcohol / Substance Use: Not Applicable Psych Involvement: No (comment)  Admission diagnosis:  COPD (chronic obstructive pulmonary disease) (HCC) [J44.9] COPD exacerbation (HCC) [J44.1] Hypertensive emergency [I16.1] Patient Active Problem List   Diagnosis Date Noted   COPD (chronic obstructive pulmonary disease) (HCC) 07/10/2022   Hypertensive emergency 07/10/2022   COPD  exacerbation (HCC) 07/09/2022   Alcohol withdrawal with complication with inpatient treatment, with unspecified complication (HCC) 07/09/2022   History of pulmonary embolism 07/09/2022   Acute on chronic diastolic CHF (congestive heart failure) (HCC) 07/09/2022   Mixed hyperlipidemia 07/09/2022   Elevated troponin level not due myocardial infarction 07/09/2022   Acute respiratory failure with hypoxia (HCC) 12/03/2018   Elevated brain natriuretic peptide (BNP) level 12/03/2018   Tobacco abuse    Beta thalassemia minor 03/21/2018   Shortness of breath 03/14/2018   Pulmonary embolism (HCC) 03/14/2018   Chronic pain 03/14/2018   Microcytic anemia 03/14/2018   COPD mixed type (HCC) 03/14/2018   Primary osteoarthritis of right knee 10/15/2014   Osteoarthritis of left knee 03/19/2014   Morbid obesity (HCC) 03/19/2014   Anemia 09/09/2012   Depression 09/09/2012   Transaminitis 09/08/2012   Tylenol overdose 09/07/2012   Suicidal overdose (HCC) 09/07/2012   Hypokalemia 09/07/2012   Alcohol dependence (HCC) 09/07/2012   Gastritis 09/07/2012   Esophagitis 09/07/2012   Essential hypertension 09/10/2008   Obstructive sleep apnea 09/10/2008  [facility]P:  [facility]   [id number] - [place], [place] - [address] AT [facility] [address] [place] [address] Phone: [phone] Fax: [phone]  [facility] - [place], [place] - [address] [place] [address] Phone: [phone] Fax: [phone]  [facility] [address] [place] [address] Phone: [phone] Fax: [phone]     Social Determinants of Health (SDOH) Interventions    Readmission Risk Interventions    07/13/2022   12:20 PM  Readmission Risk Prevention Plan  Transportation Screening Complete  PCP or Specialist Appt within 3-5  Days Complete  HRI or Home Care Consult Complete  Social Work Consult for Recovery Care Planning/Counseling Complete  Palliative Care Screening Not Applicable  Medication Review [facility]) Referral to Pharmacy

## 2023-03-28 ENCOUNTER — Ambulatory Visit (HOSPITAL_COMMUNITY)
Admission: EM | Admit: 2023-03-28 | Discharge: 2023-03-28 | Disposition: A | Discharge status: Other Acute Inpatient Hospital | Payer: No Typology Code available for payment source | Attending: Family Medicine | Admitting: Family Medicine

## 2023-03-28 ENCOUNTER — Encounter (HOSPITAL_COMMUNITY): Payer: Self-pay | Admitting: Emergency Medicine

## 2023-03-28 ENCOUNTER — Emergency Department (HOSPITAL_COMMUNITY)
Admission: EM | Admit: 2023-03-28 | Discharge: 2023-03-29 | Payer: No Typology Code available for payment source | Attending: Emergency Medicine | Admitting: Emergency Medicine

## 2023-03-28 DIAGNOSIS — F17200 Nicotine dependence, unspecified, uncomplicated: Secondary | ICD-10-CM | POA: Diagnosis not present

## 2023-03-28 DIAGNOSIS — I491 Atrial premature depolarization: Secondary | ICD-10-CM | POA: Diagnosis not present

## 2023-03-28 DIAGNOSIS — R9431 Abnormal electrocardiogram [ECG] [EKG]: Secondary | ICD-10-CM

## 2023-03-28 DIAGNOSIS — I739 Peripheral vascular disease, unspecified: Secondary | ICD-10-CM | POA: Diagnosis not present

## 2023-03-28 DIAGNOSIS — Z1152 Encounter for screening for COVID-19: Secondary | ICD-10-CM | POA: Diagnosis not present

## 2023-03-28 DIAGNOSIS — J449 Chronic obstructive pulmonary disease, unspecified: Secondary | ICD-10-CM | POA: Insufficient documentation

## 2023-03-28 DIAGNOSIS — I4510 Unspecified right bundle-branch block: Secondary | ICD-10-CM | POA: Diagnosis not present

## 2023-03-28 DIAGNOSIS — R634 Abnormal weight loss: Secondary | ICD-10-CM | POA: Diagnosis not present

## 2023-03-28 DIAGNOSIS — Z79899 Other long term (current) drug therapy: Secondary | ICD-10-CM | POA: Insufficient documentation

## 2023-03-28 DIAGNOSIS — Z7901 Long term (current) use of anticoagulants: Secondary | ICD-10-CM | POA: Diagnosis not present

## 2023-03-28 DIAGNOSIS — F419 Anxiety disorder, unspecified: Secondary | ICD-10-CM | POA: Insufficient documentation

## 2023-03-28 DIAGNOSIS — Y901 Blood alcohol level of 20-39 mg/100 ml: Secondary | ICD-10-CM | POA: Diagnosis not present

## 2023-03-28 DIAGNOSIS — F1010 Alcohol abuse, uncomplicated: Secondary | ICD-10-CM | POA: Diagnosis present

## 2023-03-28 DIAGNOSIS — E1159 Type 2 diabetes mellitus with other circulatory complications: Secondary | ICD-10-CM

## 2023-03-28 DIAGNOSIS — I110 Hypertensive heart disease with heart failure: Secondary | ICD-10-CM | POA: Diagnosis not present

## 2023-03-28 DIAGNOSIS — F329 Major depressive disorder, single episode, unspecified: Secondary | ICD-10-CM | POA: Insufficient documentation

## 2023-03-28 DIAGNOSIS — R008 Other abnormalities of heart beat: Secondary | ICD-10-CM | POA: Insufficient documentation

## 2023-03-28 DIAGNOSIS — F1120 Opioid dependence, uncomplicated: Secondary | ICD-10-CM | POA: Diagnosis not present

## 2023-03-28 DIAGNOSIS — M25569 Pain in unspecified knee: Secondary | ICD-10-CM | POA: Diagnosis not present

## 2023-03-28 DIAGNOSIS — F333 Major depressive disorder, recurrent, severe with psychotic symptoms: Secondary | ICD-10-CM | POA: Insufficient documentation

## 2023-03-28 DIAGNOSIS — Z86711 Personal history of pulmonary embolism: Secondary | ICD-10-CM | POA: Insufficient documentation

## 2023-03-28 DIAGNOSIS — I10 Essential (primary) hypertension: Secondary | ICD-10-CM | POA: Diagnosis not present

## 2023-03-28 DIAGNOSIS — I509 Heart failure, unspecified: Secondary | ICD-10-CM | POA: Insufficient documentation

## 2023-03-28 DIAGNOSIS — Z7951 Long term (current) use of inhaled steroids: Secondary | ICD-10-CM | POA: Diagnosis not present

## 2023-03-28 DIAGNOSIS — G8929 Other chronic pain: Secondary | ICD-10-CM | POA: Diagnosis not present

## 2023-03-28 DIAGNOSIS — R45851 Suicidal ideations: Secondary | ICD-10-CM | POA: Diagnosis present

## 2023-03-28 DIAGNOSIS — Z7984 Long term (current) use of oral hypoglycemic drugs: Secondary | ICD-10-CM | POA: Diagnosis not present

## 2023-03-28 DIAGNOSIS — G894 Chronic pain syndrome: Secondary | ICD-10-CM

## 2023-03-28 LAB — CBC WITH DIFFERENTIAL/PLATELET
Abs Immature Granulocytes: 0.02 10*3/uL (ref 0.00–0.07)
Basophils Absolute: 0.1 10*3/uL (ref 0.0–0.1)
Basophils Relative: 1 %
Eosinophils Absolute: 0.1 10*3/uL (ref 0.0–0.5)
Eosinophils Relative: 1 %
HCT: 36.8 % — ABNORMAL LOW (ref 39.0–52.0)
Hemoglobin: 11.6 g/dL — ABNORMAL LOW (ref 13.0–17.0)
Immature Granulocytes: 0 %
Lymphocytes Relative: 27 %
Lymphs Abs: 2.6 10*3/uL (ref 0.7–4.0)
MCH: 19.3 pg — ABNORMAL LOW (ref 26.0–34.0)
MCHC: 31.5 g/dL (ref 30.0–36.0)
MCV: 61.1 fL — ABNORMAL LOW (ref 80.0–100.0)
Monocytes Absolute: 0.8 10*3/uL (ref 0.1–1.0)
Monocytes Relative: 8 %
Neutro Abs: 6.3 10*3/uL (ref 1.7–7.7)
Neutrophils Relative %: 63 %
Platelets: 369 10*3/uL (ref 150–400)
RBC: 6.02 MIL/uL — ABNORMAL HIGH (ref 4.22–5.81)
RDW: 15.3 % (ref 11.5–15.5)
WBC: 9.9 10*3/uL (ref 4.0–10.5)
nRBC: 0 % (ref 0.0–0.2)

## 2023-03-28 LAB — LIPID PANEL
Cholesterol: 209 mg/dL — ABNORMAL HIGH (ref 0–200)
HDL: 62 mg/dL (ref >40)
LDL Cholesterol: 125 mg/dL — ABNORMAL HIGH (ref 0–99)
Total CHOL/HDL Ratio: 3.4 RATIO
Triglycerides: 111 mg/dL (ref <150)
VLDL: 22 mg/dL (ref 0–40)

## 2023-03-28 LAB — COMPREHENSIVE METABOLIC PANEL
ALT: 17 U/L (ref 0–44)
ALT: 17 U/L (ref 0–44)
AST: 24 U/L (ref 15–41)
AST: 24 U/L (ref 15–41)
Albumin: 3.4 g/dL — ABNORMAL LOW (ref 3.5–5.0)
Albumin: 3.2 g/dL — ABNORMAL LOW (ref 3.5–5.0)
Alkaline Phosphatase: 99 U/L (ref 38–126)
Alkaline Phosphatase: 94 U/L (ref 38–126)
Anion gap: 15 (ref 5–15)
Anion gap: 13 (ref 5–15)
BUN: 38 mg/dL — ABNORMAL HIGH (ref 8–23)
BUN: 39 mg/dL — ABNORMAL HIGH (ref 8–23)
CO2: 23 mmol/L (ref 22–32)
CO2: 26 mmol/L (ref 22–32)
Calcium: 9.3 mg/dL (ref 8.9–10.3)
Calcium: 9.3 mg/dL (ref 8.9–10.3)
Chloride: 103 mmol/L (ref 98–111)
Chloride: 104 mmol/L (ref 98–111)
Creatinine, Ser: 1.61 mg/dL — ABNORMAL HIGH (ref 0.61–1.24)
Creatinine, Ser: 1.77 mg/dL — ABNORMAL HIGH (ref 0.61–1.24)
GFR, Estimated: 47 mL/min — ABNORMAL LOW (ref >60)
GFR, Estimated: 42 mL/min — ABNORMAL LOW (ref >60)
Glucose, Bld: 83 mg/dL (ref 70–99)
Glucose, Bld: 120 mg/dL — ABNORMAL HIGH (ref 70–99)
Potassium: 3.6 mmol/L (ref 3.5–5.1)
Potassium: 3.4 mmol/L — ABNORMAL LOW (ref 3.5–5.1)
Sodium: 141 mmol/L (ref 135–145)
Sodium: 143 mmol/L (ref 135–145)
Total Bilirubin: 0.5 mg/dL (ref 0.3–1.2)
Total Bilirubin: 0.6 mg/dL (ref 0.3–1.2)
Total Protein: 7.4 g/dL (ref 6.5–8.1)
Total Protein: 7.6 g/dL (ref 6.5–8.1)

## 2023-03-28 LAB — CBC
HCT: 36.4 % — ABNORMAL LOW (ref 39.0–52.0)
Hemoglobin: 11.4 g/dL — ABNORMAL LOW (ref 13.0–17.0)
MCH: 19.5 pg — ABNORMAL LOW (ref 26.0–34.0)
MCHC: 31.3 g/dL (ref 30.0–36.0)
MCV: 62.2 fL — ABNORMAL LOW (ref 80.0–100.0)
Platelets: 406 10*3/uL — ABNORMAL HIGH (ref 150–400)
RBC: 5.85 MIL/uL — ABNORMAL HIGH (ref 4.22–5.81)
RDW: 15.4 % (ref 11.5–15.5)
WBC: 9.4 10*3/uL (ref 4.0–10.5)
nRBC: 0 % (ref 0.0–0.2)

## 2023-03-28 LAB — TSH: TSH: 1.207 u[IU]/mL (ref 0.350–4.500)

## 2023-03-28 LAB — SALICYLATE LEVEL: Salicylate Lvl: <7 mg/dL — ABNORMAL LOW (ref 7.0–30.0)

## 2023-03-28 LAB — ACETAMINOPHEN LEVEL: Acetaminophen (Tylenol), Serum: <10 ug/mL — ABNORMAL LOW (ref 10–30)

## 2023-03-28 LAB — ETHANOL
Alcohol, Ethyl (B): 133 mg/dL — ABNORMAL HIGH (ref <10)
Alcohol, Ethyl (B): 24 mg/dL — ABNORMAL HIGH (ref <10)

## 2023-03-28 LAB — SARS CORONAVIRUS 2 BY RT PCR: SARS Coronavirus 2 by RT PCR: NEGATIVE

## 2023-03-28 MED ORDER — OXYCODONE-ACETAMINOPHEN 10-325 MG PO TABS: 1.0000 | ORAL_TABLET | Freq: Three times a day (TID) | ORAL | Status: DC | PRN

## 2023-03-28 MED ORDER — OXYCODONE HCL 5 MG PO TABS: 5.0000 mg | ORAL_TABLET | Freq: Three times a day (TID) | ORAL | Status: DC | PRN

## 2023-03-28 MED ORDER — AMLODIPINE BESYLATE 10 MG PO TABS: 10.0000 mg | ORAL_TABLET | Freq: Every day | ORAL | Status: DC

## 2023-03-28 MED ORDER — ATORVASTATIN CALCIUM 40 MG PO TABS: 40.0000 mg | ORAL_TABLET | Freq: Every evening | ORAL | Status: DC

## 2023-03-28 MED ORDER — ALUM & MAG HYDROXIDE-SIMETH 200-200-20 MG/5ML PO SUSP: 30.0000 mL | ORAL | Status: DC | PRN

## 2023-03-28 MED ORDER — NICOTINE POLACRILEX 2 MG MT GUM: 4.0000 mg | CHEWING_GUM | OROMUCOSAL | Status: DC | PRN

## 2023-03-28 MED ORDER — NICOTINE POLACRILEX 4 MG MT GUM: 4.0000 mg | CHEWING_GUM | OROMUCOSAL | 0 refills | Status: DC | PRN

## 2023-03-28 MED ORDER — FERROUS SULFATE 325 (65 FE) MG PO TABS: 325.0000 mg | ORAL_TABLET | ORAL | Status: DC

## 2023-03-28 MED ORDER — ALBUTEROL SULFATE HFA 108 (90 BASE) MCG/ACT IN AERS: 2.0000 | INHALATION_SPRAY | Freq: Four times a day (QID) | RESPIRATORY_TRACT | Status: DC | PRN

## 2023-03-28 MED ORDER — HYDROXYZINE HCL 10 MG PO TABS
10.0000 mg | ORAL_TABLET | Freq: Three times a day (TID) | ORAL | Status: DC
  Administered 2023-03-28: 10 mg via ORAL
  Filled 2023-03-28: qty 1

## 2023-03-28 MED ORDER — THIAMINE HCL 100 MG PO TABS
100.0000 mg | ORAL_TABLET | Freq: Every day | ORAL | Status: DC
  Filled 2023-03-28: qty 1

## 2023-03-28 MED ORDER — FUROSEMIDE 40 MG PO TABS: 80.0000 mg | ORAL_TABLET | Freq: Two times a day (BID) | ORAL | Status: DC

## 2023-03-28 MED ORDER — CARVEDILOL 3.125 MG PO TABS: 6.2500 mg | ORAL_TABLET | Freq: Two times a day (BID) | ORAL | Status: DC

## 2023-03-28 MED ORDER — TIZANIDINE HCL 2 MG PO TABS: 4.0000 mg | ORAL_TABLET | Freq: Every evening | ORAL | Status: DC | PRN

## 2023-03-28 MED ORDER — LOSARTAN POTASSIUM 50 MG PO TABS: 25.0000 mg | ORAL_TABLET | Freq: Every day | ORAL | Status: DC

## 2023-03-28 MED ORDER — OXYCODONE-ACETAMINOPHEN 5-325 MG PO TABS: 1.0000 | ORAL_TABLET | Freq: Three times a day (TID) | ORAL | Status: DC | PRN

## 2023-03-28 MED ORDER — CLONIDINE HCL 0.1 MG PO TABS: 0.3000 mg | ORAL_TABLET | Freq: Three times a day (TID) | ORAL | Status: DC

## 2023-03-28 MED ORDER — MOMETASONE FURO-FORMOTEROL FUM 200-5 MCG/ACT IN AERO: 2.0000 | INHALATION_SPRAY | Freq: Two times a day (BID) | RESPIRATORY_TRACT | Status: DC

## 2023-03-28 MED ORDER — ISOSORBIDE MONONITRATE ER 30 MG PO TB24: 60.0000 mg | ORAL_TABLET | Freq: Every day | ORAL | Status: DC

## 2023-03-28 MED ORDER — TRAZODONE HCL 50 MG PO TABS: 50.0000 mg | ORAL_TABLET | Freq: Every evening | ORAL | Status: DC | PRN

## 2023-03-28 MED ORDER — ACETAMINOPHEN 325 MG PO TABS
650.0000 mg | ORAL_TABLET | Freq: Four times a day (QID) | ORAL | Status: DC | PRN
  Administered 2023-03-28: 650 mg via ORAL
  Filled 2023-03-28: qty 2

## 2023-03-28 MED ORDER — SPIRONOLACTONE 25 MG PO TABS: 25.0000 mg | ORAL_TABLET | Freq: Every day | ORAL | Status: DC

## 2023-03-28 MED ORDER — HYDRALAZINE HCL 50 MG PO TABS: 100.0000 mg | ORAL_TABLET | Freq: Three times a day (TID) | ORAL | Status: DC

## 2023-03-28 MED ORDER — FOLIC ACID 1 MG PO TABS: 1.0000 mg | ORAL_TABLET | Freq: Every day | ORAL | Status: DC

## 2023-03-28 MED ORDER — MAGNESIUM HYDROXIDE 400 MG/5ML PO SUSP: 30.0000 mL | Freq: Every day | ORAL | Status: DC | PRN

## 2023-03-28 MED ORDER — APIXABAN 5 MG PO TABS: 5.0000 mg | ORAL_TABLET | Freq: Two times a day (BID) | ORAL | Status: DC

## 2023-03-28 NOTE — ED Notes (Signed)
Gave report to Pension scheme manager at [employer]

## 2023-03-28 NOTE — ED Provider Notes (Signed)
[facility] Continuous Assessment Admission H&P  Date: 03/28/23 Patient Name: Jeremy Douglas MRN: [id number] Chief Complaint:   Diagnoses:  Final diagnoses:  Suicidal ideations  Severe episode of recurrent major depressive disorder, with psychotic features  Alcohol abuse  Unintentional weight loss  Nonspecific abnormal electrocardiogram (ECG) (EKG)   HPI:  Jeremy Douglas 68 y.o., male patient presented to [facility] as a walk in, accompanied by law enforcement after calling crisis line reporting active thoughts of suicide with a plan to shoot himself.  Jeremy Douglas has mental history significant for depression, anxiety, and alcohol dependence. He also has a complex medical history consisting of  Chronic pulmonary embolisms with treatment on chronic anticoagulants ,COPD, CHF, Hypertension, Peripheral Vascular Disease. Opioid dependence secondary to chronic pain treatment.  Patient initially arrived voluntarily, however placed under IVC petition as he verbalized a desire to leave the facility. IVC petition reads as follows:   Respondent is actively suicidal with a plan to end life by shooting himself in the heart. Respondent has guns in his home, lives alone, admits to heavy alcohol use, and severe depression. Respondent is an immediate danger to self and meets criteria for inpatient psychiatric treatment for mental health crisis stabilization.   Jeremy Douglas, 67 y.o., male patient seen face to face by this provider, consulted with Dr. Dwyane Dee; and chart reviewed on 03/28/23.    On evaluation Jeremy Douglas reports over the last several months experiencing negative thoughts in his head consisting of feeling lonely, tired, sorrow, and admits to active thoughts of killing himself by "shooting myself in the heart". Patient endorses owning several guns which he locked away before calling the suicide line. Patient also reports an unintentional weight loss over the last year of nearly 70  lbs and can't recall the last time he ate over the last 5 days.  He is followed by [facility] outpatient clinic however, reports he had a therapist but has seen them recently. Due to depression he has been drinking a 5th of liquor daily over the last week. He is also chronically prescribed Percocet for pain.   During evaluation Jeremy Douglas is (position) in no acute distress.  He is  alert, oriented x 4, tearful but cooperative and inattentive. His mood is depressed with congruent affect. His speech is slowed, non pressured.  Cooperative behavior, however patient oppositional to inpatient treatment. Objectively there is no evidence of mania or delusional thinking.  Patient is able to converse coherently, although appears preoccupied with smoking and leaving facility.  Patient denies homicidal ideations,She also denies suicidal/self-harm/homicidal ideation, psychosis, and paranoia.  Patient is at [place] risk for completing suicide and meets criteria for inpatient psychiatric treatment.  On arrival patient's BP was 198/93. Rechecked an hour later 162/88. ECG abnormal and reflect SR with 1 st degree block (PR interval 22), with PAC, compared to ECG 06/2022, there has been notable change, however not ST changes suggesting acute ischemia. QTC 456.  Given patient's complex medical history, patient will be sent to ED for medical clearance and may return to [facility] while awaiting inpatient placement.    Musculoskeletal  Strength & Muscle Tone: within normal limits Gait & Station: normal Patient leans: N/A  Psychiatric Specialty Exam  Presentation General Appearance: Disheveled  Eye Contact:Fair  Speech:Slow  Speech Volume:Normal  Handedness:Right   Mood and Affect  Mood:Depressed; Hopeless  Affect:Tearful; Depressed   Thought Process  Thought Processes:Disorganized  Descriptions of Associations:Circumstantial  Orientation:Full (Time, Place and Person)  Thought Content:Rumination; Logical     Hallucinations:Hallucinations: None  Ideas of Reference:None  Suicidal Thoughts:Suicidal Thoughts: Yes, Active SI Active Intent and/or Plan: With Plan  Homicidal Thoughts:Homicidal Thoughts: No   Sensorium  Memory:Immediate Good; Recent Good; Remote Good  Judgment:Poor  Insight:Poor   Executive Functions  Concentration:Poor  Attention Span:Fair  [place]   Psychomotor Activity  Psychomotor Activity:Psychomotor Activity: Normal   Assets  Assets:Desire for Improvement; Resilience; Social Support; Catering manager; Housing; Transportation   Sleep  Sleep:Sleep: Poor Number of Hours of Sleep: 4   Nutritional Assessment (For OBS and FBC admissions only) Has the patient had a weight loss or gain of 10 pounds or more in the last 3 months?: Yes Has the patient had a decrease in food intake/or appetite?: Yes Does the patient have dental problems?: No Does the patient have eating habits or behaviors that may be indicators of an eating disorder including binging or inducing vomiting?: No Has the patient recently lost weight without trying?: 4 Has the patient been eating poorly because of a decreased appetite?: 1 Malnutrition Screening Tool Score: 5 Nutritional Assessment Referrals: Other (comment) (Nutritional supplements and ED for medical clearance)    Physical Exam Constitutional:      Appearance: He is ill-appearing.  HENT:     Head: Normocephalic.  Eyes:     Extraocular Movements: Extraocular movements intact.     Pupils: Pupils are equal, round, and reactive to light.  Cardiovascular:     Rate and Rhythm: Normal rate.  Pulmonary:     Effort: Pulmonary effort is normal.  Musculoskeletal:     Cervical back: Rigidity present.  Neurological:     General: No focal deficit present.     Mental Status: He is alert.       Review of Systems  Psychiatric/Behavioral:  Positive for depression,  substance abuse and suicidal ideas. Negative for hallucinations and memory loss. The patient is nervous/anxious and has insomnia.     Blood pressure (!) 162/88, pulse 81, temperature (!) 97.5 F (36.4 C), temperature source Oral, resp. rate 19, SpO2 94 %. There is no height or weight on file to calculate BMI.   Is the patient at risk to self? Yes  Has the patient been a risk to self in the past 6 months? Yes .    Has the patient been a risk to self within the distant past? No   Is the patient a risk to others? No   Has the patient been a risk to others in the past 6 months? No   Has the patient been a risk to others within the distant past? No    Social History: Lives alone, ex-wife is bestfriend  and provides care for her since her stroke. She lives 1/2 mile from him and his only daughter lives 1/2 mile away from him. Endorses close relationship with ex-wife and daughter denies any other social interactions.  Last Labs:  No visits with results within 6 Month(s) from this visit.  Latest known visit with results is:  No results displayed because visit has over 200 results.      Allergies: Amlodipine  Medications:  Facility Ordered Medications  Medication   acetaminophen (TYLENOL) tablet 650 mg   alum & mag hydroxide-simeth (MAALOX/MYLANTA) 200-200-20 MG/5ML suspension 30 mL   magnesium hydroxide (MILK OF MAGNESIA) suspension 30 mL   hydrOXYzine (ATARAX) tablet 10 mg   traZODone (DESYREL) tablet 50 mg   nicotine polacrilex (NICORETTE) gum 4  mg   PTA Medications  Medication Sig   Multiple Vitamin (MULITIVITAMIN WITH MINERALS) TABS Take 1 tablet by mouth daily.    furosemide (LASIX) 40 MG tablet Take 80 mg by mouth 2 (two) times daily.   oxyCODONE-acetaminophen (PERCOCET) 10-325 MG tablet Take 1 tablet by mouth every 8 (eight) hours as needed for pain (knees).    tiZANidine (ZANAFLEX) 4 MG tablet Take 4 mg by mouth at bedtime as needed for muscle spasms.   Melatonin 5 MG TABS  Take 5 mg by mouth at bedtime as needed (for sleep).   OVER THE COUNTER MEDICATION CPAP   atorvastatin (LIPITOR) 20 MG tablet Take 1 tablet (20 mg total) by mouth daily at 6 PM. (Patient taking differently: Take 40 mg by mouth every evening.)   ferrous sulfate 325 (65 FE) MG tablet Take 1 tablet (325 mg total) by mouth daily with breakfast. (Patient taking differently: Take 325 mg by mouth every Monday, Wednesday, and Friday.)   polyethylene glycol (MIRALAX / GLYCOLAX) packet Take 17 g by mouth daily as needed for moderate constipation or severe constipation.   ELIQUIS 5 MG TABS tablet TAKE 1 TABLET BY MOUTH TWICE DAILY (Patient taking differently: Take 5 mg by mouth 2 (two) times daily.)   naloxone (NARCAN) nasal spray 4 mg/0.1 mL Place 1 spray into the nose once as needed.   DULoxetine (CYMBALTA) 60 MG capsule Take 60 mg by mouth daily. for pain   fluticasone-salmeterol (ADVAIR) 250-50 MCG/ACT AEPB Inhale 1 puff into the lungs daily as needed (for shortness of breath).   glimepiride (AMARYL) 2 MG tablet Take 2 mg by mouth daily with breakfast.   albuterol (VENTOLIN HFA) 108 (90 Base) MCG/ACT inhaler Inhale 2 puffs into the lungs every 6 (six) hours as needed for wheezing or shortness of breath.   amLODipine (NORVASC) 10 MG tablet Take 1 tablet (10 mg total) by mouth daily.   carvedilol (COREG) 6.25 MG tablet Take 1 tablet (6.25 mg total) by mouth 2 (two) times daily with a meal.   isosorbide mononitrate (IMDUR) 60 MG 24 hr tablet Take 1 tablet (60 mg total) by mouth daily.   spironolactone (ALDACTONE) 25 MG tablet Take 1 tablet (25 mg total) by mouth daily.   thiamine 100 MG tablet Take 1 tablet (100 mg total) by mouth daily.   cloNIDine (CATAPRES) 0.3 MG tablet Take 1 tablet (0.3 mg total) by mouth 3 (three) times daily.   folic acid (FOLVITE) 1 MG tablet Take 1 tablet (1 mg total) by mouth daily.   hydrALAZINE (APRESOLINE) 100 MG tablet Take 1 tablet (100 mg total) by mouth 3 (three) times  daily.   losartan (COZAAR) 50 MG tablet Take 0.5 tablets (25 mg total) by mouth daily.    Medical Decision Making  Restart home medications for chronic conditions:  Will hold off on starting psychiatric medications until patient is medically cleared.  Consider Wellbutrin,Gabapentin, and Clonidine .   Recommendations  Based on my evaluation the patient appears to have an emergency medical condition for which I recommend the patient be transferred to the emergency department for further evaluation. For medical clearance and patient may return to [facility] while awaiting inpatient placement in a Geropsychiatry facility. LCSW notified via chat of patient disposition.    Molli Barrows, NP-C  03/28/23  6:27 PM

## 2023-03-28 NOTE — ED Notes (Signed)
Pt was assessed by Lavell Anchors NP.  Pt was searched and brought onto the unit.  Provider informed that pt reports opioid dependence and that he is asking for OxyContin for pain.  Provider reports that pt is in need of medical clearance and new orders are forthcoming.   Pt is currently sitting on the bed and has been given dinner meal along with PRN medication for anxiety and pain.  Staff will cont to monitor for safety.

## 2023-03-28 NOTE — ED Notes (Signed)
Pt is very anxious, he is constantly at desk asking questions. He is redirectable. Will continue to monitor for safey

## 2023-03-28 NOTE — ED Notes (Signed)
[employer] transport requested to [facility] ED.  Pending IVC.

## 2023-03-28 NOTE — ED Triage Notes (Addendum)
Pt called [facility] hospital hotline for help with SI. [employer] came to house to check on him and he went willingly to [facility] for active suicidal with plan to end life by shooting himself in the heart. Has guns in home, lives alone, heavy ETOH and depression. Sent here for medical clearance. Then can be sent back there. Concerns for EKG changes.  Pt reports drinks sporadically not every day. He seems sober now. Not hearing voices or having visual hallucinations.  Smokes 1.5 packs/day. Requesting nicotine patch.

## 2023-03-28 NOTE — BH Assessment (Signed)
Comprehensive Clinical Assessment (CCA) Note  03/28/2023 Jeremy Douglas [id number]  Disposition: Per Molli Barrows, NP, patient is recommended for inpatient treatment.   Jeremy Douglas is a 67 year old male presenting to [facility] voluntarily with BHRT with chief complaint of suicidal ideations. Patient reports that he has been lonely, tired, don't want to eat or sleep and he is having thoughts of sorrow that he cant get rid of. Patient states that he has lost 70lbs and the last time he ate was a bowl of cereal this past weekend. Patient reports last night he got about 4 hours of sleep. Patient states that he has been going on a downward spiral for the past 9 months and his daughter encouraged him to seek help and get on medications to address his mood. Patient reports other episodes of depression however reports this is the worse episode making him feel suicidal. 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 Patient reports he locked his guns away because he was having thoughts about shooting himself in the heart "I know how to kill myself". Patient reports shooting himself in the head would have been too 'messy". Although patient is having these thoughts, he stated that he was not going to kill himself.       Patient reports a lot of guilt from his past concerning his daughter and ex-wife who live half mile down the road from him. Patient reports he talks to his daughter often and to his ex-wife about every day "and she is worried about me". Patients reminisce about his relationship with his ex-wife and how they were "loves, friends and business partners". Patient reports that he has a good relationship with his ex-wife and reports that she is supportive of him. 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   Patient is  [facility] a psychologist for therapy. Patient is not on any psychotropic medications, nor has he been in the past. Patient denies history of psychiatric inpatient hospitalization. Patient denies substance use however reports drinking a fifth of liquor daily for the past three days. Patient states that he drinks in "spurts", drinking 2-3 days straight and then not drinking for months. Patient states "I drink to escape".   Patient is oriented x4, engaged, alert and cooperative. Patient eye contact and speech is normal, patient reports feeling depressed and he is tearful throughout the assessment. Patient has many risk factors despite contracting for safety. Patient denies HI, AVH. Patient does not want to go into inpatient treatment and would rather follow up with outpatient. Patient risk factors include Chronic risk factors for suicide include: psychiatric disorder of depression, substance use disorder, chronic pain, and demographic factors (male, >46 y/o). Acute risk factors for suicide include: social withdrawal/isolation. Protective factors for this patient include: positive therapeutic relationship. Considering these factors, the overall suicide risk at this point appears to be high. Patient is not appropriate for outpatient follow up.  Chief Complaint:  Chief Complaint  Patient presents with   Suicidal   Alcohol Problem   Visit Diagnosis: Suicidal   Major depressive disorder, recurrent, severe, without psychotic features.    CCA Screening, Triage and Referral (STR)  Patient Reported Information How did you hear about [place]? Self  What Is the Reason for Your Visit/Call Today? Pt  called suicide hotline reporting SI. Pt reports he had a plan to shot himself in the heart but locked his guns up so he would not do anything.  How Long Has This Been Causing You Problems? > than 6 months  What Do You Feel Would Help You the Most Today? Treatment for Depression or  other mood problem   Have You Recently Had Any Thoughts About Hurting Yourself? Yes  Are You Planning to Commit Suicide/Harm Yourself At This time? Yes   [place] [facility] from 03/28/2023 in [facility] ED to Hosp-Admission (Discharged) from 07/09/2022 in [facility] 3M MEDICAL ICU  C-SSRS RISK CATEGORY Moderate Risk No Risk       Have you Recently Had Thoughts About [place]? No  Are You Planning to Harm Someone at This Time? No  Explanation: No data recorded  Have You Used Any Alcohol or Drugs in the Past 24 Hours? Yes  What Did You Use and How Much? half fifth of liquor   Do You Currently Have a Therapist/Psychiatrist? Yes  Name of Therapist/Psychiatrist: Name of Therapist/Psychiatrist: see a psychologist at the [facility] Recently Discharged From Any Office Practice or Programs? No  Explanation of Discharge From Practice/Program: NA     CCA Screening Triage Referral Assessment Type of Contact: Face-to-Face  Telemedicine Service Delivery:   Is this Initial or Reassessment?   Date Telepsych consult ordered in [facility]:    Time Telepsych consult ordered in [facility]:    Location of Assessment: [facility] Assessment Services  Provider Location: [facility] Assessment Services   Collateral Involvement: NA   Does Patient Have a Stage manager Guardian? No  Legal Guardian Contact Information: NA  Copy of Legal Guardianship Form: -- (NA)  Legal Guardian Notified of Arrival: -- (NA)  Legal Guardian Notified of Pending Discharge: -- (NA)  If Minor and Not Living with Parent(s), Who has Custody? NA  Is CPS involved or ever been involved? Never  Is APS involved or ever been involved? Never   Patient Determined To Be At Risk for Harm To Self or Others Based on Review of Patient Reported Information or Presenting Complaint? Yes, for Self-Harm  Method: Plan without intent  Availability of Means: Has close  by  Intent: Vague intent or NA  Notification Required: No need or identified person  Additional Information for Danger to Others Potential: -- (NA)  Additional Comments for Danger to Others Potential: NA  Are There Guns or Other Weapons in Your Home? Yes  Types of Guns/Weapons: GUNS  Are These Weapons Safely Secured?                            Yes  Who Could Verify You Are Able To Have These Secured: FAMILY  Do You Have any Outstanding Charges, Pending Court Dates, Parole/Probation? NO  Contacted To Inform of Risk of Harm To Self or Others: -- (NA)    Does Patient Present under Involuntary Commitment? No    [place] of Residence: [place]   Patient Currently Receiving the Following Services: Individual Therapy   Determination of Need: Emergent (2 hours)   Options For Referral: Medication Management; Outpatient Therapy; Inpatient Hospitalization     CCA Biopsychosocial Patient Reported Schizophrenia/Schizoaffective Diagnosis in Past: No   Strengths: HAS SUPPORTS   Mental Health Symptoms Depression:   Change in energy/activity; Difficulty Concentrating; Increase/decrease in appetite; Sleep (too much or  little); Tearfulness; Weight gain/loss; Worthlessness   Duration of Depressive symptoms:  Duration of Depressive Symptoms: Greater than two weeks   Mania:   None   Anxiety:    Worrying   Psychosis:   None   Duration of Psychotic symptoms:    Trauma:   None   Obsessions:   None   Compulsions:   None   Inattention:   None   Hyperactivity/Impulsivity:   None   Oppositional/Defiant Behaviors:   None   Emotional Irregularity:   None   Other Mood/Personality Symptoms:   NA    Mental Status Exam Appearance and self-care  Stature:   Average   Weight:   Overweight   Clothing:   Dirty; Disheveled   Grooming:   Neglected   Cosmetic use:   None   Posture/gait:   Normal   Motor activity:   Not Remarkable   Sensorium   Attention:   Normal   Concentration:   Normal   Orientation:   Person; Place; Situation   Recall/memory:   Normal   Affect and Mood  Affect:   Depressed; Tearful   Mood:   Depressed   Relating  Eye contact:   Normal   Facial expression:   Responsive   Attitude toward examiner:   Cooperative   Thought and Language  Speech flow:  Clear and Coherent   Thought content:   Appropriate to Mood and Circumstances   Preoccupation:   None   Hallucinations:   None   Organization:   Coherent   [employer] of Knowledge:   Fair   Intelligence:   Average   Abstraction:   Normal   Judgement:   Fair   Art therapist:   Adequate   Insight:   Fair   Decision Making:   Normal   Social Functioning  Social Maturity:   Responsible   Social Judgement:   Normal   Stress  Stressors:   Relationship   Coping Ability:   Advice worker Deficits:   None   Supports:   Family; Friends/Service system     Religion: Religion/Spirituality Are You A Religious Person?: Yes What is Your Religious Affiliation?: Baptist How Might This Affect Treatment?: NA  Leisure/Recreation: Leisure / Recreation Do You Have Hobbies?: No  Exercise/Diet: Exercise/Diet Do You Exercise?: No Have You Gained or Lost A Significant Amount of Weight in the Past Six Months?: Yes-Lost Number of Pounds Lost?: 70 Do You Follow a Special Diet?: No Do You Have Any Trouble Sleeping?: Yes Explanation of Sleeping Difficulties: SLEEPING 4 HOURS A NIGHT   CCA Employment/Education Employment/Work Situation: Employment / Work Nurse, children's Situation: Retired Social research officer, government has Been Impacted by Current Illness: No Has Patient ever Been in Passenger transport manager?: Yes (Describe in comment) Did You Receive Any Psychiatric Treatment/Services While in the [employer]?: No  Education: Education Is Patient Currently Attending School?: No Did You Have An Individualized  Education Program (IIEP): No Did You Have Any Difficulty At [employer]?: No Patient's Education Has Been Impacted by Current Illness: No   CCA Family/Childhood History Family and Relationship History: Family history Marital status: Divorced Divorced, when?: 27 YEARS AGO What types of issues is patient dealing with in the relationship?: UNKNOWN Additional relationship information: NA Does patient have children?: Yes How many children?: 1 How is patient's relationship with their children?: GOOD RELATIONSHIP  Childhood History:  Childhood History By whom was/is the patient raised?: Both parents Did patient suffer any verbal/emotional/physical/sexual abuse as a child?:  No Did patient suffer from severe childhood neglect?: No Has patient ever been sexually abused/assaulted/raped as an adolescent or adult?: No Was the patient ever a victim of a crime or a disaster?: No Witnessed domestic violence?: No Has patient been affected by domestic violence as an adult?: No       CCA Substance Use Alcohol/Drug Use: Alcohol / Drug Use Pain Medications: SEE MAR Prescriptions: SEE MAR Over the Counter: SEE MAR History of alcohol / drug use?: Yes Longest period of sobriety (when/how long): UNKNOWN Withdrawal Symptoms: None Substance #1 Name of Substance 1: ETOH 1 - Age of First Use: 17 1 - Amount (size/oz): FIFTH 1 - Frequency: DAILY 1 - Duration: PAST THREE DAYS 1 - Last Use / Amount: TODAY 1/2 FIFTH 1 - Method of Aquiring: NA 1- Route of Use: ORAL                       ASAM's:  Six Dimensions of Multidimensional Assessment  Dimension 1:  Acute Intoxication and/or Withdrawal Potential:      Dimension 2:  Biomedical Conditions and Complications:      Dimension 3:  Emotional, Behavioral, or Cognitive Conditions and Complications:     Dimension 4:  Readiness to Change:     Dimension 5:  Relapse, Continued use, or Continued Problem Potential:     Dimension 6:   Recovery/Living Environment:     ASAM Severity Score:    ASAM Recommended Level of Treatment: ASAM Recommended Level of Treatment: Level II Intensive Outpatient Treatment   Substance use Disorder (SUD) Substance Use Disorder (SUD)  Checklist Symptoms of Substance Use: Continued use despite having a persistent/recurrent physical/psychological problem caused/exacerbated by use  Recommendations for Services/Supports/Treatments: Recommendations for Services/Supports/Treatments Recommendations For Services/Supports/Treatments: Inpatient Hospitalization, Individual Therapy, Medication Management  Discharge Disposition: Discharge Disposition Medical Exam completed: Yes Disposition of Patient: Admit Mode of transportation if patient is discharged/movement?: Other (comment)  DSM5 Diagnoses: Patient Active Problem List   Diagnosis Date Noted   COPD (chronic obstructive pulmonary disease) 07/10/2022   Hypertensive emergency 07/10/2022   COPD exacerbation 07/09/2022   Alcohol withdrawal with complication with inpatient treatment, with unspecified complication [id number]   History of pulmonary embolism 07/09/2022   Acute on chronic diastolic CHF (congestive heart failure) 07/09/2022   Mixed hyperlipidemia 07/09/2022   Elevated troponin level not due myocardial infarction 07/09/2022   Acute respiratory failure with hypoxia 12/03/2018   Elevated brain natriuretic peptide (BNP) level 12/03/2018   Tobacco abuse    Beta thalassemia minor 03/21/2018   Shortness of breath 03/14/2018   Pulmonary embolism 03/14/2018   Chronic pain 03/14/2018   Microcytic anemia 03/14/2018   COPD mixed type 03/14/2018   Primary osteoarthritis of right knee 10/15/2014   Osteoarthritis of left knee 03/19/2014   Morbid obesity 03/19/2014   Anemia 09/09/2012   Depression 09/09/2012   Transaminitis 09/08/2012   Tylenol overdose 09/07/2012   Suicidal overdose 09/07/2012   Hypokalemia 09/07/2012   Alcohol dependence  ([place]) 09/07/2012   Gastritis 09/07/2012   Esophagitis 09/07/2012   Essential hypertension 09/10/2008   Obstructive sleep apnea 09/10/2008     Referrals to Alternative Service(s): Referred to Alternative Service(s):   Place:   Date:   Time:    Referred to Alternative Service(s):   Place:   Date:   Time:    Referred to Alternative Service(s):   Place:   Date:   Time:    Referred to Alternative Service(s):   Place:   Date:  Time:     Shakoya Gilmore Julien Nordmann, [facility]

## 2023-03-28 NOTE — Progress Notes (Signed)
   03/28/23 1728  [place] (Walk-ins at [facility] only)  How Did You Hear About [place]? Self  What Is the Reason for Your Visit/Call Today? Pt called suicide hotline reporting SI. Pt reports he had a plan to shot himself in the heart but locked his guns up so he would not do anything.  How Long Has This Been Causing You Problems? > than 6 months  Have You Recently Had Any Thoughts About Hurting Yourself? Yes  Are You Planning to Commit Suicide/Harm Yourself At This time? Yes  Have you Recently Had Thoughts About Hurting Someone [place] Dawn? No  Are You Planning To Harm Someone At This Time? No  Are you currently experiencing any auditory, visual or other hallucinations? No  Have You Used Any Alcohol or Drugs in the Past 24 Hours? Yes  How long ago did you use Drugs or Alcohol? today  What Did You Use and How Much? half fifth of liquor  Do you have any current medical co-morbidities that require immediate attention? Yes  Please describe current medical co-morbidities that require immediate attention: pain from past surgeries  Clinician description of patient physical appearance/behavior: depressed, tearful  What Do You Feel Would Help You the Most Today? Treatment for Depression or other mood problem  If access to [facility] was not available, would you have sought care in the Emergency Department? No  Determination of Need Emergent (2 hours)  Options For Referral Medication Management;Outpatient Therapy;Inpatient Hospitalization

## 2023-03-28 NOTE — ED Notes (Signed)
[facility] at bedside to transport to [facility].  Pt is IVC.

## 2023-03-29 ENCOUNTER — Ambulatory Visit (HOSPITAL_COMMUNITY)
Admission: EM | Admit: 2023-03-29 | Discharge: 2023-03-30 | Disposition: A | Discharge status: Other Acute Inpatient Hospital | Payer: Medicare PPO | Attending: Psychiatry | Admitting: Psychiatry

## 2023-03-29 ENCOUNTER — Other Ambulatory Visit: Payer: Self-pay

## 2023-03-29 DIAGNOSIS — I130 Hypertensive heart and chronic kidney disease with heart failure and stage 1 through stage 4 chronic kidney disease, or unspecified chronic kidney disease: Secondary | ICD-10-CM | POA: Diagnosis not present

## 2023-03-29 DIAGNOSIS — E785 Hyperlipidemia, unspecified: Secondary | ICD-10-CM

## 2023-03-29 DIAGNOSIS — I739 Peripheral vascular disease, unspecified: Secondary | ICD-10-CM | POA: Insufficient documentation

## 2023-03-29 DIAGNOSIS — F419 Anxiety disorder, unspecified: Secondary | ICD-10-CM | POA: Insufficient documentation

## 2023-03-29 DIAGNOSIS — R45851 Suicidal ideations: Secondary | ICD-10-CM

## 2023-03-29 DIAGNOSIS — F332 Major depressive disorder, recurrent severe without psychotic features: Secondary | ICD-10-CM | POA: Diagnosis not present

## 2023-03-29 DIAGNOSIS — J449 Chronic obstructive pulmonary disease, unspecified: Secondary | ICD-10-CM | POA: Diagnosis not present

## 2023-03-29 DIAGNOSIS — I119 Hypertensive heart disease without heart failure: Secondary | ICD-10-CM | POA: Diagnosis not present

## 2023-03-29 DIAGNOSIS — I509 Heart failure, unspecified: Secondary | ICD-10-CM | POA: Diagnosis not present

## 2023-03-29 DIAGNOSIS — G894 Chronic pain syndrome: Secondary | ICD-10-CM

## 2023-03-29 LAB — RAPID URINE DRUG SCREEN, HOSP PERFORMED
Amphetamines: NOT DETECTED
Barbiturates: NOT DETECTED
Benzodiazepines: NOT DETECTED
Cocaine: NOT DETECTED
Opiates: NOT DETECTED
Tetrahydrocannabinol: NOT DETECTED

## 2023-03-29 LAB — HEMOGLOBIN A1C
Hgb A1c MFr Bld: 5.4 % (ref 4.8–5.6)
Mean Plasma Glucose: 108 mg/dL

## 2023-03-29 MED ORDER — CLONIDINE HCL 0.1 MG PO TABS
0.2000 mg | ORAL_TABLET | Freq: Two times a day (BID) | ORAL | Status: DC
  Administered 2023-03-29: 0.2 mg via ORAL
  Filled 2023-03-29: qty 2

## 2023-03-29 MED ORDER — OXYCODONE-ACETAMINOPHEN 5-325 MG PO TABS
2.0000 | ORAL_TABLET | Freq: Once | ORAL | Status: AC
  Administered 2023-03-29: 2 via ORAL
  Filled 2023-03-29: qty 2

## 2023-03-29 MED ORDER — HYDRALAZINE HCL 50 MG PO TABS
100.0000 mg | ORAL_TABLET | Freq: Three times a day (TID) | ORAL | Status: DC
  Administered 2023-03-29 – 2023-03-30 (×4): 100 mg via ORAL
  Filled 2023-03-29 (×4): qty 2

## 2023-03-29 MED ORDER — HYDRALAZINE HCL 100 MG PO TABS: 100.0000 mg | ORAL_TABLET | Freq: Three times a day (TID) | ORAL | Status: DC

## 2023-03-29 MED ORDER — OXYCODONE HCL 5 MG PO TABS
5.0000 mg | ORAL_TABLET | Freq: Four times a day (QID) | ORAL | Status: DC | PRN
  Administered 2023-03-29 – 2023-03-30 (×3): 5 mg via ORAL
  Filled 2023-03-29 (×3): qty 1

## 2023-03-29 MED ORDER — CLONIDINE HCL 0.1 MG PO TABS: 0.3000 mg | ORAL_TABLET | Freq: Three times a day (TID) | ORAL | Status: DC

## 2023-03-29 MED ORDER — ALUM & MAG HYDROXIDE-SIMETH 200-200-20 MG/5ML PO SUSP
30.0000 mL | ORAL | Status: DC | PRN
  Administered 2023-03-29: 30 mL via ORAL
  Filled 2023-03-29: qty 30

## 2023-03-29 MED ORDER — LABETALOL HCL 100 MG PO TABS
200.0000 mg | ORAL_TABLET | Freq: Three times a day (TID) | ORAL | Status: DC
  Administered 2023-03-29 – 2023-03-30 (×3): 200 mg via ORAL
  Filled 2023-03-29 (×4): qty 2

## 2023-03-29 MED ORDER — MAGNESIUM HYDROXIDE 400 MG/5ML PO SUSP: 30.0000 mL | Freq: Every day | ORAL | Status: DC | PRN

## 2023-03-29 MED ORDER — NICOTINE 14 MG/24HR TD PT24
14.0000 mg | MEDICATED_PATCH | Freq: Every day | TRANSDERMAL | Status: DC
  Administered 2023-03-29 – 2023-03-30 (×2): 14 mg via TRANSDERMAL
  Filled 2023-03-29 (×2): qty 1

## 2023-03-29 MED ORDER — OXYCODONE-ACETAMINOPHEN 5-325 MG PO TABS
1.0000 | ORAL_TABLET | Freq: Four times a day (QID) | ORAL | Status: DC | PRN
  Administered 2023-03-29 – 2023-03-30 (×4): 1 via ORAL
  Filled 2023-03-29 (×4): qty 1

## 2023-03-29 MED ORDER — TRAZODONE HCL 50 MG PO TABS
50.0000 mg | ORAL_TABLET | Freq: Every evening | ORAL | Status: DC | PRN
  Administered 2023-03-29 (×2): 50 mg via ORAL
  Filled 2023-03-29 (×2): qty 1

## 2023-03-29 MED ORDER — OXYCODONE-ACETAMINOPHEN 10-325 MG PO TABS: 1.0000 | ORAL_TABLET | Freq: Four times a day (QID) | ORAL | Status: DC | PRN

## 2023-03-29 MED ORDER — CARVEDILOL 3.125 MG PO TABS: 6.2500 mg | ORAL_TABLET | Freq: Two times a day (BID) | ORAL | Status: DC

## 2023-03-29 MED ORDER — CLONIDINE HCL 0.1 MG PO TABS
0.3000 mg | ORAL_TABLET | Freq: Once | ORAL | Status: AC
  Administered 2023-03-29: 0.3 mg via ORAL
  Filled 2023-03-29: qty 3

## 2023-03-29 MED ORDER — ACETAMINOPHEN 325 MG PO TABS: 650.0000 mg | ORAL_TABLET | Freq: Four times a day (QID) | ORAL | Status: DC | PRN

## 2023-03-29 NOTE — ED Notes (Signed)
[facility] states to [facility] report in the am since he will not be coming until then. Jonny Ruiz states to tcall report to [phone] and gave three different ext [phone]

## 2023-03-29 NOTE — ED Notes (Signed)
Rn left message for shieff about taking patient to [facility] in the am

## 2023-03-29 NOTE — ED Notes (Signed)
Patients vital were taken by mht they got 122/49 Rn rechecked on both arm left arm 152/54 pulse 68 rt arm 150/61 pulse 65 Patient denies any s/s of hypertension or hypotension.Notified provider. Will continue  to monitor.

## 2023-03-29 NOTE — Progress Notes (Signed)
Inpatient Behavioral Health Placement-[facility] per Burgess Amor, RN. [facility] has [facility] and this CSW called the [facility] and spoke with the AOD who advised this CSW that there are beds and to send the referral. This CSW advised that CSW shift has ended but 1st shift will follow up.    Maryjean Ka, MSW, [facility] 03/29/2023 12:54 AM

## 2023-03-29 NOTE — Progress Notes (Addendum)
ADDENDUM  Due to [facility] and transportation issues, pt will transport to [facility] 03/30/2023. Pt will need to go to BUILDING 2 for new covid test, before admitting to psychiatric unit, building 8, bed 111-2   Pt was accepted to [facility] TODAY 03/29/2023. Bed assignment: Building 8, Bed: 111-2  Pt meets inpatient criteria per Doran Heater, NP  Attending Physician will be Ann Lions, MD  Report can be called to: [phone] ext. [phone] or [phone]  Pt can arrive before 8 PM (if arriving after 8pm, pt will need to go to building 2 to obtain new covid test before admitting to psychiatric unit)  Care Team Notified: Doran Heater, NP, Durwin Reges, RN, Darrick Grinder, NP, Fransico Michael, RN, Joaquin Courts, NP, and [address] LCSWA  [place], [place]  03/29/2023 1:51 PM

## 2023-03-29 NOTE — ED Notes (Signed)
Pt returns from [facility], under IVC, SI, with plan to shoot self in heart with gun.  Pt contracts for safety at present.  Resting at present, monitoring for safety.

## 2023-03-29 NOTE — ED Notes (Signed)
Patient  sleeping in no acute stress. RR even and unlabored .Environment secured .Will continue to monitor for safely. 

## 2023-03-29 NOTE — ED Notes (Addendum)
Rn called sheriff to get patient ride to [facility] hospital . Jeremy Douglas states that they will not be able to take patient until tomorrow.  Notified provider and social work. [facility]. Social work states that it is ok for him to go tomorrow. States that he needs to go to building 2 for covid

## 2023-03-29 NOTE — ED Notes (Signed)
IVC PAPERWORK with [employer] [employer]

## 2023-03-29 NOTE — ED Provider Notes (Signed)
[facility] Urgent Care Continuous Assessment Admission H&P  Date: 03/29/23 Patient Name: Jeremy Douglas MRN: [id number] Chief Complaint: "I had thoughts of harming myself, but now I'm ok"  Diagnoses:  Final diagnoses:  Suicidal ideation    HPI: Jeremy Douglas is a 67 year-old veteran male who presented voluntarily to ED yesterday, after he called crisis line to report that he was experiencing thoughts of suicide. He presented with a hx of depression, anxiety and alcohol dependence, along with a medical hx of COPD, CHF, HTN and peripheral vascular disease. He also reports opioid use secondary to chronic back pain.  Patient was placed under IVC as he started trying to leave without being evaluated and treated.  The IVC petition stated :   "Respondent is actively suicidal with a plan to end life by shooting himself in the heart. Respondent has guns in his home, lives alone, admits to heavy alcohol use, and severe depression. Respondent is an immediate danger to self and meets criteria for inpatient psychiatric treatment for mental health crisis stabilization".   Patient was evaluated by a provider here at [facility] and was sent to ED for medical clearance secondary to his chronic medical condition. Patient was seen by an ED provider  (Dr Wilkie Aye) who medically cleared him and sent him back here for admission,  treatment and disposition.    Upon assessment:patient is alert and oriented x 4. Cooperative and denying suicidal ideations. His mood is improved as evidenced by his smiling face and sense of humor. His speech is clear and he is agreeable and sharing his life experience. He denies SI/HI/AVH. He does not appear to be preoccupied or responding to internal stimuli. His BP remains elevated  (204/79) and he continues to have knee pain.  Reports that he had called the crisis line and reported that he was feeling suicidal.  He reports that he was feeling lonely and his daughter had gotten tired talking to him and  his ex-wife was not talking to him as much as she used to. Patient thought about shooting himself as his depression was increasing. Patient was drinking alcohol excessively. He had missed his appointment with his therapist. Patient was also experiencing knee pain  and was feeling hopeless/helpless. Patient reports that missing his appointment with his therapist may have contributed to his crisis. He also reports that he was drunk  "but now I feel good, I need to see my therapist".   Patient is readmitted to observation unit and BP medications ordered. His BP 146/64 after taking medications. He is requesting to meet with MD asap to discuss disposition.      Total Time spent with patient: 30 minutes  Musculoskeletal  Strength & Muscle Tone: within normal limits Gait & Station: normal Patient leans: N/A  Psychiatric Specialty Exam  Presentation General Appearance:  Disheveled  Eye Contact: Fair  Speech: Clear and Coherent  Speech Volume: Normal  Handedness: Right   Mood and Affect  Mood: Depressed  Affect: Depressed   Thought Process  Thought Processes: Coherent  Descriptions of Associations:Intact  Orientation:Full (Time, Place and Person)  Thought Content:Logical  Diagnosis of Schizophrenia or Schizoaffective disorder in past: No   Hallucinations:Hallucinations: None  Ideas of Reference:None  Suicidal Thoughts:Suicidal Thoughts: No SI Active Intent and/or Plan: With Plan  Homicidal Thoughts:Homicidal Thoughts: No   Sensorium  Memory: Immediate Good; Recent Good; Remote Good  Judgment: Fair  Insight: Fair   Chartered certified accountant: Fair  Attention Span: Fair  Recall: [employer]  of Knowledge: Fair  Language: Fair   Lexicographersychomotor Activity  Psychomotor Activity: Psychomotor Activity: Normal   Assets  Assets: Communication Skills; Desire for Improvement; Financial Resources/Insurance; Housing; Transportation   Sleep   Sleep: Sleep: Poor Number of Hours of Sleep: 4   Nutritional Assessment (For OBS and FBC admissions only) Has the patient had a weight loss or gain of 10 pounds or more in the last 3 months?: Yes Has the patient had a decrease in food intake/or appetite?: Yes Does the patient have dental problems?: No Does the patient have eating habits or behaviors that may be indicators of an eating disorder including binging or inducing vomiting?: No Has the patient recently lost weight without trying?: 0 Has the patient been eating poorly because of a decreased appetite?: 1 Malnutrition Screening Tool Score: 1 Nutritional Assessment Referrals: Other (comment)    Physical Exam Constitutional:      Appearance: Normal appearance.  HENT:     Head: Normocephalic and atraumatic.     Right Ear: Tympanic membrane normal.     Left Ear: Tympanic membrane normal.     Nose: Nose normal.     Mouth/Throat:     Mouth: Mucous membranes are moist.  Eyes:     Extraocular Movements: Extraocular movements intact.     Pupils: Pupils are equal, round, and reactive to light.  Cardiovascular:     Rate and Rhythm: Regular rhythm.     Pulses: Normal pulses.     Heart sounds: Normal heart sounds.     Comments: HTN, PVD Pulmonary:     Comments: Hx of COPD Musculoskeletal:        General: Normal range of motion.     Cervical back: Normal range of motion and neck supple.     Comments: Chronic back/knee pain  Skin:    General: Skin is warm and dry.  Neurological:     General: No focal deficit present.     Mental Status: He is alert and oriented to person, place, and time.  Psychiatric:        Behavior: Behavior normal.    Review of Systems  Constitutional: Negative.   HENT: Negative.    Eyes: Negative.   Respiratory: Negative.    Cardiovascular:        Hx of HTN, PVD  Gastrointestinal: Negative.   Genitourinary: Negative.   Musculoskeletal: Negative.   Skin: Negative.   Neurological: Negative.    Endo/Heme/Allergies: Negative.   Psychiatric/Behavioral:  Positive for depression and substance abuse.     Blood pressure (!) 204/79, pulse 64, temperature 98.5 F (36.9 C), temperature source Oral, resp. rate 20, SpO2 98 %. There is no height or weight on file to calculate BMI.  Past Psychiatric History: Depression, Alcohol dependence   Is the patient at risk to self? No  Has the patient been a risk to self in the past 6 months? Yes .    Has the patient been a risk to self within the distant past? No   Is the patient a risk to others? No   Has the patient been a risk to others in the past 6 months? No   Has the patient been a risk to others within the distant past? No   Past Medical History: HTN, COPD, CHF, Peripheral Vascular disease, Chronic Pulmonary embolism  Family History: NA  Social History: Patient is divorced, veteran, lives alone. Has a daughter who checks on him. Ex-wife is supportive "but she gets busy with her new boyfriend"  Last Labs:  Admission on 03/28/2023, Discharged on 03/29/2023  Component Date Value Ref Range Status   Sodium 03/28/2023 143  135 - 145 mmol/L Final   Potassium 03/28/2023 3.4 (L)  3.5 - 5.1 mmol/L Final   Chloride 03/28/2023 104  98 - 111 mmol/L Final   CO2 03/28/2023 26  22 - 32 mmol/L Final   Glucose, Bld 03/28/2023 120 (H)  70 - 99 mg/dL Final   Glucose reference range applies only to samples taken after fasting for at least 8 hours.   BUN 03/28/2023 39 (H)  8 - 23 mg/dL Final   Creatinine, Ser 03/28/2023 1.77 (H)  0.61 - 1.24 mg/dL Final   Calcium 91/47/8295 9.3  8.9 - 10.3 mg/dL Final   Total Protein 62/13/0865 7.6  6.5 - 8.1 g/dL Final   Albumin 78/46/9629 3.2 (L)  3.5 - 5.0 g/dL Final   AST 52/84/1324 24  15 - 41 U/L Final   ALT 03/28/2023 17  0 - 44 U/L Final   Alkaline Phosphatase 03/28/2023 94  38 - 126 U/L Final   Total Bilirubin 03/28/2023 0.6  0.3 - 1.2 mg/dL Final   GFR, Estimated 03/28/2023 42 (L)  >60 mL/min Final    Comment: (NOTE) Calculated using the CKD-EPI Creatinine Equation (2021)    Anion gap 03/28/2023 13  5 - 15 Final   Performed at [facility], [address] [place], [place] [address]   Alcohol, Ethyl (B) 03/28/2023 24 (H)  <10 mg/dL Final   Comment: (NOTE) Lowest detectable limit for serum alcohol is 10 mg/dL.  For medical purposes only. Performed at [facility], [address], [place], [place] [address]    Salicylate Lvl 03/28/2023 <7.0 (L)  7.0 - 30.0 mg/dL Final   Performed at [facility], [address] [place], [place] [address]   Acetaminophen (Tylenol), Serum 03/28/2023 <10 (L)  10 - 30 ug/mL Final   Comment: (NOTE) Therapeutic concentrations vary significantly. A range of 10-30 ug/mL  may be an effective concentration for many patients. However, some  are best treated at concentrations outside of this range. Acetaminophen concentrations >150 ug/mL at 4 hours after ingestion  and >50 ug/mL at 12 hours after ingestion are often associated with  toxic reactions.  Performed at [facility], [address] [place], [place] [address]    WBC 03/28/2023 9.4  4.0 - 10.5 K/uL Final   RBC 03/28/2023 5.85 (H)  4.22 - 5.81 MIL/uL Final   Hemoglobin 03/28/2023 11.4 (L)  13.0 - 17.0 g/dL Final   HCT 95/63/8756 36.4 (L)  39.0 - 52.0 % Final   MCV 03/28/2023 62.2 (L)  80.0 - 100.0 fL Final   MCH 03/28/2023 19.5 (L)  26.0 - 34.0 pg Final   MCHC 03/28/2023 31.3  30.0 - 36.0 g/dL Final   RDW 43/32/9518 15.4  11.5 - 15.5 % Final   Platelets 03/28/2023 406 (H)  150 - 400 K/uL Final   REPEATED TO VERIFY   nRBC 03/28/2023 0.0  0.0 - 0.2 % Final   Performed at [facility], [address], [place], [place] [address]   Opiates 03/28/2023 NONE DETECTED  NONE DETECTED Final   Cocaine 03/28/2023 NONE DETECTED  NONE DETECTED Final   Benzodiazepines 03/28/2023 NONE DETECTED  NONE DETECTED Final   Amphetamines 03/28/2023 NONE DETECTED  NONE DETECTED Final    Tetrahydrocannabinol 03/28/2023 NONE DETECTED  NONE DETECTED Final   Barbiturates 03/28/2023 NONE DETECTED  NONE DETECTED Final   Comment: (NOTE) DRUG SCREEN FOR MEDICAL PURPOSES ONLY.  IF CONFIRMATION IS NEEDED FOR ANY PURPOSE, NOTIFY LAB WITHIN 5 DAYS.  LOWEST DETECTABLE LIMITS FOR URINE DRUG SCREEN Drug Class                     Cutoff (ng/mL) Amphetamine and metabolites    1000 Barbiturate and metabolites    200 Benzodiazepine                 200 Opiates and metabolites        300 Cocaine and metabolites        300 THC                            50 Performed at [facility], [address], [place], [place] [address]   Admission on 03/28/2023, Discharged on 03/28/2023  Component Date Value Ref Range Status   SARS Coronavirus 2 by RT PCR 03/28/2023 NEGATIVE  NEGATIVE Final   Performed at [facility], [address], [place], [place] [address]   WBC 03/28/2023 9.9  4.0 - 10.5 K/uL Final   RBC 03/28/2023 6.02 (H)  4.22 - 5.81 MIL/uL Final   Hemoglobin 03/28/2023 11.6 (L)  13.0 - 17.0 g/dL Final   HCT 09/81/1914 36.8 (L)  39.0 - 52.0 % Final   MCV 03/28/2023 61.1 (L)  80.0 - 100.0 fL Final   MCH 03/28/2023 19.3 (L)  26.0 - 34.0 pg Final   MCHC 03/28/2023 31.5  30.0 - 36.0 g/dL Final   RDW 78/29/5621 15.3  11.5 - 15.5 % Final   Platelets 03/28/2023 369  150 - 400 K/uL Final   nRBC 03/28/2023 0.0  0.0 - 0.2 % Final   Neutrophils Relative % 03/28/2023 63  % Final   Neutro Abs 03/28/2023 6.3  1.7 - 7.7 K/uL Final   Lymphocytes Relative 03/28/2023 27  % Final   Lymphs Abs 03/28/2023 2.6  0.7 - 4.0 K/uL Final   Monocytes Relative 03/28/2023 8  % Final   Monocytes Absolute 03/28/2023 0.8  0.1 - 1.0 K/uL Final   Eosinophils Relative 03/28/2023 1  % Final   Eosinophils Absolute 03/28/2023 0.1  0.0 - 0.5 K/uL Final   Basophils Relative 03/28/2023 1  % Final   Basophils Absolute 03/28/2023 0.1  0.0 - 0.1 K/uL Final   WBC Morphology 03/28/2023 MORPHOLOGY UNREMARKABLE    Final   RBC Morphology 03/28/2023 See Note   Final   Smear Review 03/28/2023 PLATELET COUNT CONFIRMED BY SMEAR   Final   Immature Granulocytes 03/28/2023 0  % Final   Abs Immature Granulocytes 03/28/2023 0.02  0.00 - 0.07 K/uL Final   Target Cells 03/28/2023 PRESENT   Final   Performed at [facility], [address], [place], [place] [address]   Sodium 03/28/2023 141  135 - 145 mmol/L Final   Potassium 03/28/2023 3.6  3.5 - 5.1 mmol/L Final   Chloride 03/28/2023 103  98 - 111 mmol/L Final   CO2 03/28/2023 23  22 - 32 mmol/L Final   Glucose, Bld 03/28/2023 83  70 - 99 mg/dL Final   Glucose reference range applies only to samples taken after fasting for at least 8 hours.   BUN 03/28/2023 38 (H)  8 - 23 mg/dL Final   Creatinine, Ser 03/28/2023 1.61 (H)  0.61 - 1.24 mg/dL Final   Calcium 78/46/9629  9.3  8.9 - 10.3 mg/dL Final   Total Protein 09/81/191404/03/2023 7.4  6.5 - 8.1 g/dL Final   Albumin 78/29/562104/03/2023 3.4 (L)  3.5 - 5.0 g/dL Final   AST 30/86/578404/03/2023 24  15 - 41 U/L Final   ALT 03/28/2023 17  0 - 44 U/L Final   Alkaline Phosphatase 03/28/2023 99  38 - 126 U/L Final   Total Bilirubin 03/28/2023 0.5  0.3 - 1.2 mg/dL Final   GFR, Estimated 03/28/2023 47 (L)  >60 mL/min Final   Comment: (NOTE) Calculated using the CKD-EPI Creatinine Equation (2021)    Anion gap 03/28/2023 15  5 - 15 Final   Performed at [facility], [address] [place], [place] [address]   Alcohol, Ethyl (B) 03/28/2023 133 (H)  <10 mg/dL Final   Comment: (NOTE) Lowest detectable limit for serum alcohol is 10 mg/dL.  For medical purposes only. Performed at [facility], [address] [place], [place] [address]    Cholesterol 03/28/2023 209 (H)  0 - 200 mg/dL Final   Triglycerides 32/44/010204/03/2023 111  <150 mg/dL Final   HDL 72/53/664404/03/2023 62  >40 mg/dL Final   Total CHOL/HDL Ratio 03/28/2023 3.4  RATIO Final   VLDL 03/28/2023 22  0 - 40 mg/dL Final   LDL Cholesterol 03/28/2023 125 (H)  0 - 99 mg/dL Final    Comment:        Total Cholesterol/HDL:CHD Risk Coronary Heart Disease Risk Table                     Men   Women  1/2 Average Risk   3.4   3.3  Average Risk       5.0   4.4  2 X Average Risk   9.6   7.1  3 X Average Risk  23.4   11.0        Use the calculated Patient Ratio above and the CHD Risk Table to determine the patient's CHD Risk.        ATP III CLASSIFICATION (LDL):  <100     mg/dL   Optimal  [id number]  mg/dL   Near or Above                    Optimal  130-159  mg/dL   Borderline  [id number]  mg/dL   High  >756>190     mg/dL   Very High Performed at [facility], [address] [place], [place] [address]    TSH 03/28/2023 1.207  0.350 - 4.500 uIU/mL Final   Comment: Performed by a 3rd Generation assay with a functional sensitivity of <=0.01 uIU/mL. Performed at [facility], [address] [place], [place] [address]     Allergies: Amlodipine  Medications:  Facility Ordered Medications  Medication   [COMPLETED] oxyCODONE-acetaminophen (PERCOCET/ROXICET) 5-325 MG per tablet 2 tablet   acetaminophen (TYLENOL) tablet 650 mg   alum & mag hydroxide-simeth (MAALOX/MYLANTA) 200-200-20 MG/5ML suspension 30 mL   magnesium hydroxide (MILK OF MAGNESIA) suspension 30 mL   traZODone (DESYREL) tablet 50 mg   [COMPLETED] cloNIDine (CATAPRES) tablet 0.3 mg   hydrALAZINE (APRESOLINE) tablet 100 mg   cloNIDine (CATAPRES) tablet 0.3 mg   carvedilol (COREG) tablet 6.25 mg   PTA Medications  Medication Sig   Multiple Vitamin (MULITIVITAMIN WITH MINERALS) TABS Take 1 tablet by mouth daily.    furosemide (LASIX) 40 MG tablet Take 80 mg by mouth 2 (two)  times daily.   oxyCODONE-acetaminophen (PERCOCET) 10-325 MG tablet Take 1 tablet by mouth every 8 (eight) hours as needed for pain (knees).    tiZANidine (ZANAFLEX) 4 MG tablet Take 4 mg by mouth at bedtime as needed for muscle spasms.   Melatonin 5 MG TABS Take 5 mg by mouth at bedtime as needed (for sleep).   OVER THE COUNTER  MEDICATION CPAP   atorvastatin (LIPITOR) 20 MG tablet Take 1 tablet (20 mg total) by mouth daily at 6 PM. (Patient taking differently: Take 40 mg by mouth every evening.)   ferrous sulfate 325 (65 FE) MG tablet Take 1 tablet (325 mg total) by mouth daily with breakfast. (Patient taking differently: Take 325 mg by mouth every Monday, Wednesday, and Friday.)   polyethylene glycol (MIRALAX / GLYCOLAX) packet Take 17 g by mouth daily as needed for moderate constipation or severe constipation.   ELIQUIS 5 MG TABS tablet TAKE 1 TABLET BY MOUTH TWICE DAILY (Patient taking differently: Take 5 mg by mouth 2 (two) times daily.)   naloxone (NARCAN) nasal spray 4 mg/0.1 mL Place 1 spray into the nose once as needed.   DULoxetine (CYMBALTA) 60 MG capsule Take 60 mg by mouth daily. for pain   fluticasone-salmeterol (ADVAIR) 250-50 MCG/ACT AEPB Inhale 1 puff into the lungs daily as needed (for shortness of breath).   glimepiride (AMARYL) 2 MG tablet Take 2 mg by mouth daily with breakfast.   albuterol (VENTOLIN HFA) 108 (90 Base) MCG/ACT inhaler Inhale 2 puffs into the lungs every 6 (six) hours as needed for wheezing or shortness of breath.   amLODipine (NORVASC) 10 MG tablet Take 1 tablet (10 mg total) by mouth daily.   carvedilol (COREG) 6.25 MG tablet Take 1 tablet (6.25 mg total) by mouth 2 (two) times daily with a meal.   isosorbide mononitrate (IMDUR) 60 MG 24 hr tablet Take 1 tablet (60 mg total) by mouth daily.   spironolactone (ALDACTONE) 25 MG tablet Take 1 tablet (25 mg total) by mouth daily.   thiamine 100 MG tablet Take 1 tablet (100 mg total) by mouth daily.   cloNIDine (CATAPRES) 0.3 MG tablet Take 1 tablet (0.3 mg total) by mouth 3 (three) times daily.   folic acid (FOLVITE) 1 MG tablet Take 1 tablet (1 mg total) by mouth daily.   hydrALAZINE (APRESOLINE) 100 MG tablet Take 1 tablet (100 mg total) by mouth 3 (three) times daily.   losartan (COZAAR) 50 MG tablet Take 0.5 tablets (25 mg total) by  mouth daily.   nicotine polacrilex (NICORETTE) 4 MG gum Take 1 each (4 mg total) by mouth every 2 (two) hours as needed for smoking cessation.    Medical Decision Making   Readmit to Observation Unit under IVC: to be evaluated by MD in AM to determine disposition  Medications: Acetaminophen 650 mg PO Q 6hr PRN for mild pain Maalox 30 ml PO Q 4 PRN for indigestion Milk of Magnesia 30 ml PO Daily PRN for mild constipations Trazodone 50 mg PO HS PRN for insomnia  Carvedilol 6.25 mg PO BID with meals Clonidine 0.3 mg PO TID  Hydralazine 100 mg PO TID   Recommendations  Based on my evaluation the patient does not appear to have an emergency medical condition. Admitted to Observation unit Olin Pia, NP 03/29/23  3:02 AM

## 2023-03-29 NOTE — ED Notes (Signed)
Patients blood pressure was 186/62 pulse 61 notified provider . Denies any s/s of hypertension.

## 2023-03-29 NOTE — ED Notes (Signed)
Patient requested mylanta for indegestion

## 2023-03-29 NOTE — Progress Notes (Signed)
[facility] Referral Follow Up  11:03 AM - CSW received a phone call from Vernona Rieger in admissions at [facility]. Vernona Rieger reports that she has received pt's referral packet, but is missing the IVC paperwork. Vernona Rieger advised CSW to resend IVC paperwork now that the fax machine is not busy. CSW informed nursing to re-fax IVC paperwork in a few minutes. CSW will await an update from Vernona Rieger via phone call.  Cathie Beams, [place]  03/29/2023 11:09 AM

## 2023-03-29 NOTE — ED Notes (Addendum)
Pt A&O x 4, no distress noted, Sleeping at present.  Monitoring for safety.  [facility], [facility]/23, via [employer].

## 2023-03-29 NOTE — ED Notes (Signed)
Clondide was held   due to pulse being 57. Notified provider

## 2023-03-29 NOTE — ED Notes (Signed)
Patients has clonidine 0.3 mg ,hydralazine 100 mg and carvedilol 6.25 mg ordered his blood pressure was 174/69 and pulse is 50. Rn has taken pulse several times and this is the highest number rn can get,Rn notified the provider and provider states to hold medication at this time. Will continue to monitor.Patient is breathing non labored at this time . In no distress.

## 2023-03-29 NOTE — ED Notes (Signed)
Report given to Michelle at [facility] 

## 2023-03-29 NOTE — ED Notes (Signed)
Rn drop labetalol on floor had to take another one out the pixis

## 2023-03-29 NOTE — ED Notes (Signed)
Patient alert and oriented x 3. Denies SI/HI/AVH. Denies intent or plan to harm self or others. Routine conducted according to faculty protocol. Encourage patient to notify staff with any needs or concerns. Patient verbalized agreement and understanding. Will continue to monitor for safety. 

## 2023-03-29 NOTE — Progress Notes (Signed)
[facility] Referral  Per Staci Acosta, [place], [facility] [place] has available beds for inpatient psychiatric treatment as of 12:52 AM on 03/29/2023. This CSW completed referral packet including the [facility] Transfer Information Sheet and [phone] with physician signature. CSW also faxed H&P, RN & MD progress notes, labs, IVC paperwork, psych consult note, MAR, recent vitals, and demographic facesheet to [facility] [phone]. CSW then called April, Transfer Coordinator, [phone]. [phone]. However, April is out of office until Monday 4/8. CSW completed referral packet and faxed to [facility] for review. CSW will continue to assist and follow with placement.  Cathie Beams, Theresia Majors  03/29/2023 9:55 AM

## 2023-03-29 NOTE — ED Notes (Signed)
Patient states to give patient hydralazine

## 2023-03-29 NOTE — ED Provider Notes (Addendum)
[place] [facility] [place] Provider Note   CSN: [id number] Arrival date & time: 03/28/23  2053     History  Chief Complaint  Patient presents with   Suicidal    Jeremy Douglas is a 67 y.o. male.  HPI     This is a 67 year old male who presents with suicidal ideation.  He was initially evaluated at behavioral health urgent care and sent here for medical clearance.  Patient states that he has had intruding thoughts of wanting to hurt himself.  He does have access to guns and has a plan to shoot himself.  He states he has never tried to hurt himself before but does have a history of depression, anxiety, alcohol abuse.  Denies daily alcohol use but does report fairly regular alcohol use.  He also takes Percocet for chronic knee pain.  Patient reports several month history of increasing stressors at home.  Increased alcohol use.  Reports that his ex-wife is his best friend and he does have a daughter who he engages with regularly.  Currently is only complaining of knee pain.  Denies significant withdrawal symptoms.  Home Medications Prior to Admission medications   Medication Sig Start Date End Date Taking? Authorizing Provider  albuterol (VENTOLIN HFA) 108 (90 Base) MCG/ACT inhaler Inhale 2 puffs into the lungs every 6 (six) hours as needed for wheezing or shortness of breath. 07/13/22   Leroy SeaSingh, Prashant K, MD  amLODipine (NORVASC) 10 MG tablet Take 1 tablet (10 mg total) by mouth daily. 07/13/22   Leroy SeaSingh, Prashant K, MD  atorvastatin (LIPITOR) 20 MG tablet Take 1 tablet (20 mg total) by mouth daily at 6 PM. Patient taking differently: Take 40 mg by mouth every evening. 12/06/18 07/11/23  Dimple NanasAmin, Ankit Chirag, MD  carvedilol (COREG) 6.25 MG tablet Take 1 tablet (6.25 mg total) by mouth 2 (two) times daily with a meal. 07/13/22   Leroy SeaSingh, Prashant K, MD  cloNIDine (CATAPRES) 0.3 MG tablet Take 1 tablet (0.3 mg total) by mouth 3 (three) times daily. 07/13/22   Leroy SeaSingh,  Prashant K, MD  DULoxetine (CYMBALTA) 60 MG capsule Take 60 mg by mouth daily. for pain    [provider]  ELIQUIS 5 MG TABS tablet TAKE 1 TABLET BY MOUTH TWICE DAILY Patient taking differently: Take 5 mg by mouth 2 (two) times daily. 12/06/20   Jetty DuhamelYoung, Clinton D, MD  ferrous sulfate 325 (65 FE) MG tablet Take 1 tablet (325 mg total) by mouth daily with breakfast. Patient taking differently: Take 325 mg by mouth every Monday, Wednesday, and Friday. 12/07/18 07/11/23  Amin, Loura HaltAnkit Chirag, MD  fluticasone-salmeterol (ADVAIR) 250-50 MCG/ACT AEPB Inhale 1 puff into the lungs daily as needed (for shortness of breath).    [provider]  folic acid (FOLVITE) 1 MG tablet Take 1 tablet (1 mg total) by mouth daily. 07/13/22   Leroy SeaSingh, Prashant K, MD  furosemide (LASIX) 40 MG tablet Take 80 mg by mouth 2 (two) times daily.    [provider]  glimepiride (AMARYL) 2 MG tablet Take 2 mg by mouth daily with breakfast.    [provider]  hydrALAZINE (APRESOLINE) 100 MG tablet Take 1 tablet (100 mg total) by mouth 3 (three) times daily. 07/13/22 08/12/22  Leroy SeaSingh, Prashant K, MD  isosorbide mononitrate (IMDUR) 60 MG 24 hr tablet Take 1 tablet (60 mg total) by mouth daily. 07/13/22   Leroy SeaSingh, Prashant K, MD  losartan (COZAAR) 50 MG tablet Take 0.5 tablets (25  mg total) by mouth daily. 07/13/22   Leroy Sea, MD  Melatonin 5 MG TABS Take 5 mg by mouth at bedtime as needed (for sleep).    [provider]  Multiple Vitamin (MULITIVITAMIN WITH MINERALS) TABS Take 1 tablet by mouth daily.     [provider]  naloxone [facility]) nasal spray 4 mg/0.1 mL Place 1 spray into the nose once as needed. 03/05/22   [provider]  nicotine polacrilex (NICORETTE) 4 MG gum Take 1 each (4 mg total) by mouth every 2 (two) hours as needed for smoking cessation. 03/28/23   Bing Neighbors, NP  OVER THE COUNTER MEDICATION CPAP    [provider]  oxyCODONE-acetaminophen  (PERCOCET) 10-325 MG tablet Take 1 tablet by mouth every 8 (eight) hours as needed for pain (knees).     [provider]  polyethylene glycol (MIRALAX / GLYCOLAX) packet Take 17 g by mouth daily as needed for moderate constipation or severe constipation. 12/06/18   Amin, Loura Halt, MD  spironolactone (ALDACTONE) 25 MG tablet Take 1 tablet (25 mg total) by mouth daily. 07/13/22 07/13/23  Leroy Sea, MD  thiamine 100 MG tablet Take 1 tablet (100 mg total) by mouth daily. 07/13/22   Leroy Sea, MD  tiZANidine (ZANAFLEX) 4 MG tablet Take 4 mg by mouth at bedtime as needed for muscle spasms. 11/25/18   [provider]      Allergies    Amlodipine    Review of Systems   Review of Systems  Respiratory:  Negative for shortness of breath.   Cardiovascular:  Negative for chest pain.  Musculoskeletal:        Knee pain  Psychiatric/Behavioral:  Positive for dysphoric mood and suicidal ideas.   All other systems reviewed and are negative.   Physical Exam Updated Vital Signs BP (!) 170/73 (BP Location: Left Arm)   Pulse 71   Temp 98.5 F (36.9 C) (Oral)   Resp 19   SpO2 94%  Physical Exam Vitals and nursing note reviewed.  Constitutional:      Appearance: He is well-developed. He is obese. He is not ill-appearing.  HENT:     Head: Normocephalic and atraumatic.  Eyes:     Pupils: Pupils are equal, round, and reactive to light.  Cardiovascular:     Rate and Rhythm: Normal rate and regular rhythm.  Pulmonary:     Effort: Pulmonary effort is normal. No respiratory distress.  Abdominal:     Palpations: Abdomen is soft.     Tenderness: There is no abdominal tenderness.  Musculoskeletal:     Cervical back: Neck supple.  Lymphadenopathy:     Cervical: No cervical adenopathy.  Skin:    General: Skin is warm and dry.  Neurological:     Mental Status: He is alert and oriented to person, place, and time.  Psychiatric:     Comments: Flat affect, depressed mood      ED Results / Procedures / Treatments   Labs (all labs ordered are listed, but only abnormal results are displayed) Labs Reviewed  COMPREHENSIVE METABOLIC PANEL - Abnormal; Notable for the following components:      Result Value   Potassium 3.4 (*)    Glucose, Bld 120 (*)    BUN 39 (*)    Creatinine, Ser 1.77 (*)    Albumin 3.2 (*)    GFR, Estimated 42 (*)    All other components within normal limits  ETHANOL - Abnormal; Notable  for the following components:   Alcohol, Ethyl (B) 24 (*)    All other components within normal limits  SALICYLATE LEVEL - Abnormal; Notable for the following components:   Salicylate Lvl <7.0 (*)    All other components within normal limits  ACETAMINOPHEN LEVEL - Abnormal; Notable for the following components:   Acetaminophen (Tylenol), Serum <10 (*)    All other components within normal limits  CBC - Abnormal; Notable for the following components:   RBC 5.85 (*)    Hemoglobin 11.4 (*)    HCT 36.4 (*)    MCV 62.2 (*)    MCH 19.5 (*)    Platelets 406 (*)    All other components within normal limits  RAPID URINE DRUG SCREEN, HOSP PERFORMED    EKG EKG Interpretation  Date/Time:  Thursday March 28 2023 21:04:25 EDT Ventricular Rate:  87 PR Interval:  196 QRS Duration: 120 QT Interval:  392 QTC Calculation: 471 R Axis:   35 Text Interpretation: Normal sinus rhythm Cannot rule out Anterior infarct , age undetermined Left ventricular hypertrophy Abnormal ECG When compared with ECG of 28-Mar-2023 17:24, PREVIOUS ECG IS PRESENT Confirmed by Ross Marcus ([id number]) on 03/29/2023 12:43:20 AM  Radiology No results found.  Procedures Procedures    Medications Ordered in ED Medications  oxyCODONE-acetaminophen (PERCOCET/ROXICET) 5-325 MG per tablet 2 tablet (has no administration in time range)    ED Course/ Medical Decision Making/ A&P                             Medical Decision Making Amount and/or Complexity of Data Reviewed Labs:  ordered.  Risk Prescription drug management.   This patient presents to the ED for concern of Seidel ideation, this involves an extensive number of treatment options, and is a complaint that carries with it a high risk of complications and morbidity.  I considered the following differential and admission for this acute, potentially life threatening condition.  The differential diagnosis includes suicidal ideation, mood disorder, anxiety, depression, withdrawal  MDM:    This is a 67 year old male who presents with concerns for suicidal ideation.  Was evaluated at behavioral health urgent care.  He is slated for inpatient admission for suicidal ideation.  He did try to leave and was IVC'd while at behavioral health.  Upon medical clearance, he can return to [facility].  Only complaining of knee pain to me.  Unclear how much alcohol he has truly been consuming recently.  Blood alcohol level here is 24.  He does not appear intoxicated.  No signs of active withdrawal at this time.  Labs obtained and reviewed.  BMP is at baseline with creatinine at 1.77.  Give Tylenol and salicylate levels.  He is still pending.  EKG shows no evidence of acute ischemia or arrhythmia.  He does not have any current complaints to suggest ACS.  He is medically cleared.  [facility]  (Labs, imaging, consults)  Labs: I Ordered, and personally interpreted labs.  The pertinent results include: CBC, CMP, Tylenol, salicylate levels, EtOH  Imaging Studies ordered: I ordered imaging studies including none I independently visualized and interpreted imaging. I agree with the radiologist interpretation  Additional history obtained from review.  External records from outside source obtained and reviewed including psychiatry notes  Cardiac Monitoring: The patient is not maintained on a cardiac monitor.  If on the cardiac monitor, I personally viewed and interpreted the cardiac monitored which showed  an underlying rhythm  of: N/A  Reevaluation: After the interventions noted above, I reevaluated the patient and found that they have :stayed the same  Social Determinants of Health:  lives independently, cares for ex-spouse  Disposition: Transfer to [id number]  Co morbidities that complicate the patient evaluation  Past Medical History:  Diagnosis Date   Alcohol dependence 09/07/2012   Anemia    COPD (chronic obstructive pulmonary disease)    Esophagitis 09/07/2012   Per EGD 04/2011   Gastritis 09/07/2012   Per EGD 04/2011   Headache    4 - 5 a yr (no migraines)   Heart murmur    Hypertension    takes Amlodipine,Labetalol,and Clonidine daily   Ischemic chest pain    Obesity    OSA treated with BiPAP    Osteoarthritis    "knees" (12/03/2018)   Pulmonary emboli 02/2018   Thrombocytopenia 09/07/2012   Hx of in past.   Tobacco abuse      Medicines Meds ordered this encounter  Medications   oxyCODONE-acetaminophen (PERCOCET/ROXICET) 5-325 MG per tablet 2 tablet    I have reviewed the patients home medicines and have made adjustments as needed  Problem List / ED Course: Problem List Items Addressed This Visit   None Visit Diagnoses     Suicidal ideation    -  Primary   Alcohol abuse                       Final Clinical Impression(s) / ED Diagnoses Final diagnoses:  Suicidal ideation  Alcohol abuse    Rx / DC Orders ED Discharge Orders     None         Citlalic Norlander, Mayer Maskerourtney F, MD 03/29/23 [id number]    Shon BatonHorton, Yechezkel Fertig F, MD 03/29/23 [phone]

## 2023-03-29 NOTE — ED Notes (Signed)
Patient reports pain 6/10 in back requesting pain medication

## 2023-03-29 NOTE — ED Provider Notes (Signed)
Behavioral Health Progress Note  Date and Time: 03/29/2023 1:06 PM Name: Jeremy Douglas MRN:  [id number]  Subjective: Patient states "I am doing a lot better, I had time to reflect last night.  I need somebody to talk to.  I would not have hurt myself, I just wanted someone to feel sorry for me.  I got into my own head."  Patient reassessed by this nurse practitioner face-to-face.  He is seated in observation area upon my approach, no apparent distress.  He is alert and oriented, cooperative during assessment.  He presents with depressed mood, congruent affect.  Kitai reports often feeling alone and bored while at home.  Recent stressors include after retirement he is no longer linked with natural supports including friends from work.  Chronic stressors include right knee pain related to nerve damage.  Patient states "I take Percocet because I am in pain a lot."  Patient is linked with individual counseling through the [place].  Meets with therapist Dr. Alphonsus Sias.  He did miss most recent counseling appointment.  He denies history of inpatient psychiatric hospitalization.  Currently no medications to address mood.  No family mental health history reported.  Jeremy Douglas resides alone in [place].  He shares he would "like to get out and do things more, I watch television and read a lot, I am alone."  Upon arrival, on yesterday, patient endorsed access to weapons inside his home.  Today patient states there are no weapons in his home.  Patient offered support and encouragement.  Reviewed treatment plan including inpatient psychiatric hospitalization.  Patient verbalizes understanding.  Patient uses nicotine daily at home.  Requests nicotine patch.  Diagnosis:  Final diagnoses:  Suicidal ideation    Total Time spent with patient: 30 minutes  Past Psychiatric History: Alcohol dependence, suicide attempt, major depressive disorder Past Medical History: COPD, shortness of breath, essential hypertension,  esophagitis, morbid obesity Family History: None reported Family Psychiatric  History: None reported Social History: Resides alone, alcohol use disorder, retired  Additional Social History:                         Sleep: Fair  Appetite:  Fair  Current Medications:  Current Facility-Administered Medications  Medication Dose Route Frequency Provider Last Rate Last Admin   acetaminophen (TYLENOL) tablet 650 mg  650 mg Oral Q6H PRN Marlou Sa, NP       alum & mag hydroxide-simeth (MAALOX/MYLANTA) 200-200-20 MG/5ML suspension 30 mL  30 mL Oral Q4H PRN Rayburn Go, Veronique M, NP       cloNIDine (CATAPRES) tablet 0.2 mg  0.2 mg Oral BID Lenard Lance, FNP       hydrALAZINE (APRESOLINE) tablet 100 mg  100 mg Oral TID Rayburn Go, Veronique M, NP   100 mg at 03/29/23 0959   labetalol (NORMODYNE) tablet 200 mg  200 mg Oral TID Lenard Lance, FNP       magnesium hydroxide (MILK OF MAGNESIA) suspension 30 mL  30 mL Oral Daily PRN Rayburn Go, Veronique M, NP       traZODone (DESYREL) tablet 50 mg  50 mg Oral QHS PRN Marlou Sa, NP   50 mg at 03/29/23 4098   Current Outpatient Medications  Medication Sig Dispense Refill   albuterol (VENTOLIN HFA) 108 (90 Base) MCG/ACT inhaler Inhale 2 puffs into the lungs every 6 (six) hours as needed for wheezing or shortness of breath. 6.7 g 0   atorvastatin (LIPITOR) 40 MG  tablet Take 40 mg by mouth every evening.     bacitracin ointment Apply 1 Application topically 2 (two) times daily as needed for wound care.     budesonide-formoterol (SYMBICORT) 160-4.5 MCG/ACT inhaler Inhale 2 puffs into the lungs 2 (two) times daily.     Cholecalciferol (VITAMIN D-3 PO) Take 1 tablet by mouth daily.     cloNIDine (CATAPRES) 0.2 MG tablet Take 0.2 mg by mouth 2 (two) times daily. Hold for heart rate less than 60 or systolic blood pressure less than 110.     Cyanocobalamin (VITAMIN B-12 PO) Take 1 tablet by mouth daily.     ferrous sulfate 325 (65  FE) MG EC tablet Take 325 mg by mouth every Monday, Wednesday, and Friday.     folic acid (FOLVITE) 1 MG tablet Take 1 mg by mouth daily.     furosemide (LASIX) 80 MG tablet Take 80 mg by mouth 2 (two) times daily as needed for fluid.     hydrALAZINE (APRESOLINE) 100 MG tablet Take 1 tablet (100 mg total) by mouth 3 (three) times daily. 90 tablet 0   labetalol (NORMODYNE) 200 MG tablet Take 200 mg by mouth 3 (three) times daily.     MAGNESIUM PO Take 1 tablet by mouth daily.     naloxone (NARCAN) nasal spray 4 mg/0.1 mL Place 1 spray into the nose once as needed (For opioid overdose).     oxyCODONE-acetaminophen (PERCOCET) 10-325 MG tablet Take 1 tablet by mouth See admin instructions. Take one tablet four times daily as needed for chronic pain - may take five tablets a day occasionally for increased pain.     tiZANidine (ZANAFLEX) 4 MG tablet Take 4 mg by mouth 2 (two) times daily as needed for muscle spasms.  0    Labs  Lab Results:  Admission on 03/28/2023, Discharged on 03/29/2023  Component Date Value Ref Range Status   Sodium 03/28/2023 143  135 - 145 mmol/L Final   Potassium 03/28/2023 3.4 (L)  3.5 - 5.1 mmol/L Final   Chloride 03/28/2023 104  98 - 111 mmol/L Final   CO2 03/28/2023 26  22 - 32 mmol/L Final   Glucose, Bld 03/28/2023 120 (H)  70 - 99 mg/dL Final   Glucose reference range applies only to samples taken after fasting for at least 8 hours.   BUN 03/28/2023 39 (H)  8 - 23 mg/dL Final   Creatinine, Ser 03/28/2023 1.77 (H)  0.61 - 1.24 mg/dL Final   Calcium 16/09/9603 9.3  8.9 - 10.3 mg/dL Final   Total Protein 54/08/8118 7.6  6.5 - 8.1 g/dL Final   Albumin 14/78/2956 3.2 (L)  3.5 - 5.0 g/dL Final   AST 21/30/8657 24  15 - 41 U/L Final   ALT 03/28/2023 17  0 - 44 U/L Final   Alkaline Phosphatase 03/28/2023 94  38 - 126 U/L Final   Total Bilirubin 03/28/2023 0.6  0.3 - 1.2 mg/dL Final   GFR, Estimated 03/28/2023 42 (L)  >60 mL/min Final   Comment: (NOTE) Calculated using  the CKD-EPI Creatinine Equation (2021)    Anion gap 03/28/2023 13  5 - 15 Final   Performed at [facility] Lab, [address], [place], [place] [address]   Alcohol, Ethyl (B) 03/28/2023 24 (H)  <10 mg/dL Final   Comment: (NOTE) Lowest detectable limit for serum alcohol is 10 mg/dL.  For medical purposes only. Performed at [facility], [address], [place], [place]  [address]    Salicylate Lvl 03/28/2023 <7.0 (L)  7.0 - 30.0 mg/dL Final   Performed at [facility], [address], [place], [place] [address]   Acetaminophen (Tylenol), Serum 03/28/2023 <10 (L)  10 - 30 ug/mL Final   Comment: (NOTE) Therapeutic concentrations vary significantly. A range of 10-30 ug/mL  may be an effective concentration for many patients. However, some  are best treated at concentrations outside of this range. Acetaminophen concentrations >150 ug/mL at 4 hours after ingestion  and >50 ug/mL at 12 hours after ingestion are often associated with  toxic reactions.  Performed at [facility] Lab, [address] [place], [place] [address]    WBC 03/28/2023 9.4  4.0 - 10.5 K/uL Final   RBC 03/28/2023 5.85 (H)  4.22 - 5.81 MIL/uL Final   Hemoglobin 03/28/2023 11.4 (L)  13.0 - 17.0 g/dL Final   HCT 25/04/3975 36.4 (L)  39.0 - 52.0 % Final   MCV 03/28/2023 62.2 (L)  80.0 - 100.0 fL Final   MCH 03/28/2023 19.5 (L)  26.0 - 34.0 pg Final   MCHC 03/28/2023 31.3  30.0 - 36.0 g/dL Final   RDW 73/41/9379 15.4  11.5 - 15.5 % Final   Platelets 03/28/2023 406 (H)  150 - 400 K/uL Final   REPEATED TO VERIFY   nRBC 03/28/2023 0.0  0.0 - 0.2 % Final   Performed at [facility] Lab, [address], [place], [place] [address]   Opiates 03/28/2023 NONE DETECTED  NONE DETECTED Final   Cocaine 03/28/2023 NONE DETECTED  NONE DETECTED Final   Benzodiazepines 03/28/2023 NONE DETECTED  NONE DETECTED Final   Amphetamines 03/28/2023 NONE DETECTED  NONE DETECTED Final   Tetrahydrocannabinol 03/28/2023 NONE  DETECTED  NONE DETECTED Final   Barbiturates 03/28/2023 NONE DETECTED  NONE DETECTED Final   Comment: (NOTE) DRUG SCREEN FOR MEDICAL PURPOSES ONLY.  IF CONFIRMATION IS NEEDED FOR ANY PURPOSE, NOTIFY LAB WITHIN 5 DAYS.  LOWEST DETECTABLE LIMITS FOR URINE DRUG SCREEN Drug Class                     Cutoff (ng/mL) Amphetamine and metabolites    1000 Barbiturate and metabolites    200 Benzodiazepine                 200 Opiates and metabolites        300 Cocaine and metabolites        300 THC                            50 Performed at [facility] Lab, [address], [place], [place] [address]   Admission on 03/28/2023, Discharged on 03/28/2023  Component Date Value Ref Range Status   SARS Coronavirus 2 by RT PCR 03/28/2023 NEGATIVE  NEGATIVE Final   Performed at [facility] Lab, [address] [place], [place] [address]   WBC 03/28/2023 9.9  4.0 - 10.5 K/uL Final   RBC 03/28/2023 6.02 (H)  4.22 - 5.81 MIL/uL Final   Hemoglobin 03/28/2023 11.6 (L)  13.0 - 17.0 g/dL Final   HCT 68/34/1962 36.8 (L)  39.0 - 52.0 % Final   MCV 03/28/2023 61.1 (L)  80.0 - 100.0 fL Final   MCH 03/28/2023 19.3 (L)  26.0 - 34.0 pg Final   MCHC 03/28/2023 31.5  30.0 - 36.0 g/dL Final   RDW 22/97/9892 15.3  11.5 - 15.5 %  Final   Platelets 03/28/2023 369  150 - 400 K/uL Final   nRBC 03/28/2023 0.0  0.0 - 0.2 % Final   Neutrophils Relative % 03/28/2023 63  % Final   Neutro Abs 03/28/2023 6.3  1.7 - 7.7 K/uL Final   Lymphocytes Relative 03/28/2023 27  % Final   Lymphs Abs 03/28/2023 2.6  0.7 - 4.0 K/uL Final   Monocytes Relative 03/28/2023 8  % Final   Monocytes Absolute 03/28/2023 0.8  0.1 - 1.0 K/uL Final   Eosinophils Relative 03/28/2023 1  % Final   Eosinophils Absolute 03/28/2023 0.1  0.0 - 0.5 K/uL Final   Basophils Relative 03/28/2023 1  % Final   Basophils Absolute 03/28/2023 0.1  0.0 - 0.1 K/uL Final   WBC Morphology 03/28/2023 MORPHOLOGY UNREMARKABLE   Final   RBC Morphology 03/28/2023 See  Note   Final   Smear Review 03/28/2023 PLATELET COUNT CONFIRMED BY SMEAR   Final   Immature Granulocytes 03/28/2023 0  % Final   Abs Immature Granulocytes 03/28/2023 0.02  0.00 - 0.07 K/uL Final   Target Cells 03/28/2023 PRESENT   Final   Performed at [facility] Lab, [address] [place], [place] [address]   Sodium 03/28/2023 141  135 - 145 mmol/L Final   Potassium 03/28/2023 3.6  3.5 - 5.1 mmol/L Final   Chloride 03/28/2023 103  98 - 111 mmol/L Final   CO2 03/28/2023 23  22 - 32 mmol/L Final   Glucose, Bld 03/28/2023 83  70 - 99 mg/dL Final   Glucose reference range applies only to samples taken after fasting for at least 8 hours.   BUN 03/28/2023 38 (H)  8 - 23 mg/dL Final   Creatinine, Ser 03/28/2023 1.61 (H)  0.61 - 1.24 mg/dL Final   Calcium 52/84/1324 9.3  8.9 - 10.3 mg/dL Final   Total Protein 40/09/2724 7.4  6.5 - 8.1 g/dL Final   Albumin 36/64/4034 3.4 (L)  3.5 - 5.0 g/dL Final   AST 74/25/9563 24  15 - 41 U/L Final   ALT 03/28/2023 17  0 - 44 U/L Final   Alkaline Phosphatase 03/28/2023 99  38 - 126 U/L Final   Total Bilirubin 03/28/2023 0.5  0.3 - 1.2 mg/dL Final   GFR, Estimated 03/28/2023 47 (L)  >60 mL/min Final   Comment: (NOTE) Calculated using the CKD-EPI Creatinine Equation (2021)    Anion gap 03/28/2023 15  5 - 15 Final   Performed at [facility] Lab, [address], [place], [place] [address]   Alcohol, Ethyl (B) 03/28/2023 133 (H)  <10 mg/dL Final   Comment: (NOTE) Lowest detectable limit for serum alcohol is 10 mg/dL.  For medical purposes only. Performed at [facility] Lab, [address], [place], [place] [address]    Cholesterol 03/28/2023 209 (H)  0 - 200 mg/dL Final   Triglycerides 18/84/1660 111  <150 mg/dL Final   HDL 63/12/6008 62  >40 mg/dL Final   Total CHOL/HDL Ratio 03/28/2023 3.4  RATIO Final   VLDL 03/28/2023 22  0 - 40 mg/dL Final   LDL Cholesterol 03/28/2023 125 (H)  0 - 99 mg/dL Final   Comment:        Total  Cholesterol/HDL:CHD Risk Coronary Heart Disease Risk Table                     Men   Women  1/2 Average Risk   3.4   3.3  Average  Risk       5.0   4.4  2 X Average Risk   9.6   7.1  3 X Average Risk  23.4   11.0        Use the calculated Patient Ratio above and the CHD Risk Table to determine the patient's CHD Risk.        ATP III CLASSIFICATION (LDL):  <100     mg/dL   Optimal  [id number]  mg/dL   Near or Above                    Optimal  130-159  mg/dL   Borderline  [phone]  mg/dL   High  >474>190     mg/dL   Very High Performed at [facility], [address] [place], [place] [address]    TSH 03/28/2023 1.207  0.350 - 4.500 uIU/mL Final   Comment: Performed by a 3rd Generation assay with a functional sensitivity of <=0.01 uIU/mL. Performed at [facility], [address] [place], [place] [address]     Blood Alcohol level:  Lab Results  Component Value Date   ETH 24 (H) 03/28/2023   ETH 133 (H) 03/28/2023    Metabolic Disorder Labs: Lab Results  Component Value Date   HGBA1C 5.5 07/11/2022   MPG 111.15 07/11/2022   MPG 99.67 03/14/2018   No results found for: "PROLACTIN" Lab Results  Component Value Date   CHOL 209 (H) 03/28/2023   TRIG 111 03/28/2023   HDL 62 03/28/2023   CHOLHDL 3.4 03/28/2023   VLDL 22 03/28/2023   LDLCALC 125 (H) 03/28/2023   LDLCALC 128 (H) 12/05/2018    Therapeutic Lab Levels: No results found for: "LITHIUM" No results found for: "VALPROATE" No results found for: "CBMZ"  Physical Findings   PHQ2-9    Flowsheet Row [facility]  PHQ-2 Total Score 2  PHQ-9 Total Score 9      Flowsheet Row ED [facility]20[facility] Most recent reading at 03/29/2023  8:23 AM ED [facility]28/20[facility] Most recent reading at 03/28/2023  9:01 PM ED [facility]28[facility]20[facility] Most  recent reading at 03/28/2023  5:30 PM  C-SSRS RISK CATEGORY No Risk High Risk Moderate Risk        Musculoskeletal  Strength & Muscle Tone: within normal limits Gait & Station: normal Patient leans: N/A  Psychiatric Specialty Exam  Presentation  General Appearance:  Disheveled  Eye Contact: Good  Speech: Clear and Coherent; Normal Rate  Speech Volume: Normal  Handedness: Right   Mood and Affect  Mood: Depressed  Affect: Depressed   Thought Process  Thought Processes: Coherent; Goal Directed; Linear  Descriptions of Associations:Intact  Orientation:Full (Time, Place and Person)  Thought Content:Logical  Diagnosis of Schizophrenia or Schizoaffective disorder in past: No    Hallucinations:Hallucinations: None  Ideas of Reference:None  Suicidal Thoughts:Suicidal Thoughts: No SI Active Intent and/or Plan: With Plan  Homicidal Thoughts:Homicidal Thoughts: No   Sensorium  Memory: Immediate Good  Judgment: Fair  Insight: Present   Executive Functions  Concentration: Good  Attention Span: Good  Recall: Good  Fund of Knowledge: Good  Language: Good   Psychomotor Activity  Psychomotor Activity: Psychomotor Activity: Normal   Assets  Assets: Communication Skills; Desire for Improvement; Housing; Leisure Time; Resilience; Social Support   Sleep  Sleep: Sleep: Fair Number of Hours of Sleep: 4   Nutritional Assessment (For OBS and FBC admissions only) Has the patient had a weight loss or gain of 10 pounds or more in the last 3 months?: Yes Has the patient had a decrease in food intake/or appetite?: Yes Does the patient have dental problems?: No Does the patient have eating habits or behaviors that may be indicators of an eating disorder including binging or inducing vomiting?: No Has the patient recently lost weight without trying?: 0 Has the patient been eating poorly because of a decreased appetite?: 1 Malnutrition  Screening Tool Score: 1 Nutritional Assessment Referrals: Other (comment)    Physical Exam  Physical Exam Vitals and nursing note reviewed.  Constitutional:      Appearance: Normal appearance. He is well-developed.  HENT:     Head: Normocephalic and atraumatic.  Cardiovascular:     Rate and Rhythm: Normal rate.  Pulmonary:     Effort: Pulmonary effort is normal.  Musculoskeletal:        General: Normal range of motion.     Cervical back: Normal range of motion.  Skin:    General: Skin is warm and dry.  Neurological:     Mental Status: He is alert and oriented to person, place, and time.  Psychiatric:        Attention and Perception: Attention and perception normal.        Mood and Affect: Affect normal. Mood is depressed.        Speech: Speech normal.        Behavior: Behavior normal. Behavior is cooperative.        Thought Content: Thought content normal.        Cognition and Memory: Cognition and memory normal.    Review of Systems  Constitutional: Negative.   HENT: Negative.    Eyes: Negative.   Respiratory: Negative.    Cardiovascular: Negative.   Gastrointestinal: Negative.   Genitourinary: Negative.   Musculoskeletal: Negative.   Skin: Negative.   Neurological: Negative.   Psychiatric/Behavioral:  Positive for depression and substance abuse.    Blood pressure (!) 174/69, pulse (!) 57, temperature 98.5 F (36.9 C), temperature source Oral, resp. rate 18, SpO2 100 %. There is no height or weight on file to calculate BMI.  Treatment Plan Summary: Patient reviewed with Dr. Nelly RoutArchana Kumar.  Inpatient psychiatric hospitalization recommended. Daily contact with patient to assess and evaluate symptoms and progress in treatment  Medications: -Nicotine NicoDerm transdermal patch 14 mg daily/nicotine withdrawal  Lenard Lanceina L Tamea Bai, FNP 03/29/2023 1:06 PM

## 2023-03-30 DIAGNOSIS — F332 Major depressive disorder, recurrent severe without psychotic features: Secondary | ICD-10-CM | POA: Diagnosis not present

## 2023-03-30 DIAGNOSIS — R45851 Suicidal ideations: Secondary | ICD-10-CM | POA: Diagnosis not present

## 2023-03-30 DIAGNOSIS — I119 Hypertensive heart disease without heart failure: Secondary | ICD-10-CM

## 2023-03-30 DIAGNOSIS — G894 Chronic pain syndrome: Secondary | ICD-10-CM | POA: Diagnosis not present

## 2023-03-30 DIAGNOSIS — J449 Chronic obstructive pulmonary disease, unspecified: Secondary | ICD-10-CM

## 2023-03-30 DIAGNOSIS — E785 Hyperlipidemia, unspecified: Secondary | ICD-10-CM

## 2023-03-30 MED ORDER — NICOTINE 14 MG/24HR TD PT24: 14.0000 mg | MEDICATED_PATCH | Freq: Every day | TRANSDERMAL | 0 refills | Status: DC

## 2023-03-30 MED ORDER — OXYCODONE HCL 5 MG PO TABS: 5.0000 mg | ORAL_TABLET | Freq: Four times a day (QID) | ORAL | 0 refills | Status: DC | PRN

## 2023-03-30 MED ORDER — TRAZODONE HCL 50 MG PO TABS: 50.0000 mg | ORAL_TABLET | Freq: Every evening | ORAL | 0 refills | Status: DC | PRN

## 2023-03-30 NOTE — ED Notes (Signed)
Pt sleeping at present, o distress noted.  Monitoring for safety.

## 2023-03-30 NOTE — ED Notes (Signed)
Patient A&O x 4, ambulatory. Patient discharged in no acute distress. Patient denied SI/HI, A/VH upon discharge.  Pt belongings returned to patient from locker   intact. Patient escorted to lobby via staff for transport to destination to [facility]. Patient is IVc'D.  Patient denies any s/s of hypertension or decrease pulse.Safety maintained.

## 2023-03-30 NOTE — ED Notes (Signed)
Pt awake requesting medication(see Mar). Pt calm and cooperative. Will continue  to monitor for safety

## 2023-03-30 NOTE — ED Notes (Addendum)
Provider states to give the patient labetalol even though his pulse was 57.Patient denies any s/s of hypertension or decrease pulse . Will continue to monitor for safety.

## 2023-03-30 NOTE — ED Notes (Signed)
Patient alert and oriented x 3. Denies SI/HI/AVH. Denies intent or plan to harm self or others. Routine conducted according to faculty protocol. Encourage patient to notify staff with any needs or concerns. Patient verbalized agreement and understanding. Will continue to monitor for safety. 

## 2023-03-30 NOTE — ED Notes (Signed)
[facility] [place]

## 2023-03-30 NOTE — ED Notes (Signed)
Report was given to [facility] [address]. Notified her of patient medication given and pulse and blood pressure.

## 2023-03-30 NOTE — Discharge Instructions (Signed)
[facility] [place]

## 2023-03-30 NOTE — ED Provider Notes (Signed)
FBC/OBS ASAP Discharge Summary  Date and Time: 03/30/2023 9:49 AM  Name: Jeremy Douglas  MRN:  [id number]   Discharge Diagnoses:  Final diagnoses:  Suicidal ideation  MDD (major depressive disorder), recurrent severe, without psychosis  Chronic pain syndrome  Hypertensive heart disease without heart failure  Hyperlipidemia, unspecified hyperlipidemia type  Chronic obstructive pulmonary disease, unspecified COPD type    Subjective: "[facility]"  Stay Summary:  Jeremy Douglas 67 y.o., male patient presented to [facility] as a walk in on 03/28/2023 , initially voluntarily and subsequently placed under IVC petition by this Clinical research associate, after present with active suicidal ideations and plan to shoot himself. Patient has medical history significant for hypertensive heart disease, COPD, CKD, and chronic pain, and was transferred to [facility] for medical clearance due to changes in EKG. EDP medically cleared patient and he returned here to [facility]. Patient initially oppositional to admission, however was accepted to [facility] and today appears motivated to engaged in receiving mental health treatment.  Patient has since denied active SI/HI/AH/VH.    Total Time spent with patient: 30 minutes   Current Medications:  Current Facility-Administered Medications  Medication Dose Route Frequency Provider Last Rate Last Admin   acetaminophen (TYLENOL) tablet 650 mg  650 mg Oral Q6H PRN Marlou Sa, NP       alum & mag hydroxide-simeth (MAALOX/MYLANTA) 200-200-20 MG/5ML suspension 30 mL  30 mL Oral Q4H PRN Rayburn Go, Veronique M, NP   30 mL at 03/29/23 1854   cloNIDine (CATAPRES) tablet 0.2 mg  0.2 mg Oral BID Lenard Lance, FNP   0.2 mg at 03/29/23 2121   hydrALAZINE (APRESOLINE) tablet 100 mg  100 mg Oral TID Marlou Sa, NP   100 mg at 03/30/23 2952   labetalol (NORMODYNE) tablet 200 mg  200 mg Oral TID Lenard Lance, FNP   200 mg at 03/30/23 8413   magnesium  hydroxide (MILK OF MAGNESIA) suspension 30 mL  30 mL Oral Daily PRN Marlou Sa, NP       nicotine (NICODERM CQ - dosed in mg/24 hours) patch 14 mg  14 mg Transdermal Daily Lenard Lance, FNP   14 mg at 03/30/23 0943   oxyCODONE-acetaminophen (PERCOCET/ROXICET) 5-325 MG per tablet 1 tablet  1 tablet Oral Q6H PRN Otho Bellows, RPH   1 tablet at 03/30/23 2440   And   oxyCODONE (Oxy IR/ROXICODONE) immediate release tablet 5 mg  5 mg Oral Q6H PRN Otho Bellows, RPH   5 mg at 03/30/23 1027   traZODone (DESYREL) tablet 50 mg  50 mg Oral QHS PRN Marlou Sa, NP   50 mg at 03/29/23 2122   Current Outpatient Medications  Medication Sig Dispense Refill   albuterol (VENTOLIN HFA) 108 (90 Base) MCG/ACT inhaler Inhale 2 puffs into the lungs every 6 (six) hours as needed for wheezing or shortness of breath. 6.7 g 0   atorvastatin (LIPITOR) 40 MG tablet Take 40 mg by mouth every evening.     bacitracin ointment Apply 1 Application topically 2 (two) times daily as needed for wound care.     budesonide-formoterol (SYMBICORT) 160-4.5 MCG/ACT inhaler Inhale 2 puffs into the lungs 2 (two) times daily.     Cholecalciferol (VITAMIN D-3 PO) Take 1 tablet by mouth daily.     cloNIDine (CATAPRES) 0.2 MG tablet Take 0.2 mg by mouth 2 (two) times daily. Hold for heart rate less than  60 or systolic blood pressure less than 110.     Cyanocobalamin (VITAMIN B-12 PO) Take 1 tablet by mouth daily.     ferrous sulfate 325 (65 FE) MG EC tablet Take 325 mg by mouth every Monday, Wednesday, and Friday.     folic acid (FOLVITE) 1 MG tablet Take 1 mg by mouth daily.     furosemide (LASIX) 80 MG tablet Take 80 mg by mouth 2 (two) times daily as needed for fluid.     hydrALAZINE (APRESOLINE) 100 MG tablet Take 1 tablet (100 mg total) by mouth 3 (three) times daily. 90 tablet 0   labetalol (NORMODYNE) 200 MG tablet Take 200 mg by mouth 3 (three) times daily.     MAGNESIUM PO Take 1 tablet by mouth daily.      naloxone (NARCAN) nasal spray 4 mg/0.1 mL Place 1 spray into the nose once as needed (For opioid overdose).     oxyCODONE-acetaminophen (PERCOCET) 10-325 MG tablet Take 1 tablet by mouth See admin instructions. Take one tablet four times daily as needed for chronic pain - may take five tablets a day occasionally for increased pain.     tiZANidine (ZANAFLEX) 4 MG tablet Take 4 mg by mouth 2 (two) times daily as needed for muscle spasms.  0   nicotine (NICODERM CQ - DOSED IN MG/24 HOURS) 14 mg/24hr patch Place 1 patch (14 mg total) onto the skin daily. 28 patch 0   oxyCODONE (OXY IR/ROXICODONE) 5 MG immediate release tablet Take 1 tablet (5 mg total) by mouth every 6 (six) hours as needed for severe pain. 30 tablet 0   traZODone (DESYREL) 50 MG tablet Take 1 tablet (50 mg total) by mouth at bedtime as needed for sleep. 10 tablet 0    PTA Medications:  Facility Ordered Medications  Medication   [COMPLETED] oxyCODONE-acetaminophen (PERCOCET/ROXICET) 5-325 MG per tablet 2 tablet   acetaminophen (TYLENOL) tablet 650 mg   alum & mag hydroxide-simeth (MAALOX/MYLANTA) 200-200-20 MG/5ML suspension 30 mL   magnesium hydroxide (MILK OF MAGNESIA) suspension 30 mL   traZODone (DESYREL) tablet 50 mg   [COMPLETED] cloNIDine (CATAPRES) tablet 0.3 mg   hydrALAZINE (APRESOLINE) tablet 100 mg   cloNIDine (CATAPRES) tablet 0.2 mg   labetalol (NORMODYNE) tablet 200 mg   nicotine (NICODERM CQ - dosed in mg/24 hours) patch 14 mg   oxyCODONE-acetaminophen (PERCOCET/ROXICET) 5-325 MG per tablet 1 tablet   And   oxyCODONE (Oxy IR/ROXICODONE) immediate release tablet 5 mg   PTA Medications  Medication Sig   oxyCODONE-acetaminophen (PERCOCET) 10-325 MG tablet Take 1 tablet by mouth See admin instructions. Take one tablet four times daily as needed for chronic pain - may take five tablets a day occasionally for increased pain.   tiZANidine (ZANAFLEX) 4 MG tablet Take 4 mg by mouth 2 (two) times daily as needed for  muscle spasms.   naloxone (NARCAN) nasal spray 4 mg/0.1 mL Place 1 spray into the nose once as needed (For opioid overdose).   albuterol (VENTOLIN HFA) 108 (90 Base) MCG/ACT inhaler Inhale 2 puffs into the lungs every 6 (six) hours as needed for wheezing or shortness of breath.   hydrALAZINE (APRESOLINE) 100 MG tablet Take 1 tablet (100 mg total) by mouth 3 (three) times daily.   budesonide-formoterol (SYMBICORT) 160-4.5 MCG/ACT inhaler Inhale 2 puffs into the lungs 2 (two) times daily.   oxyCODONE (OXY IR/ROXICODONE) 5 MG immediate release tablet Take 1 tablet (5 mg total) by mouth every 6 (six) hours as needed  for severe pain.   nicotine (NICODERM CQ - DOSED IN MG/24 HOURS) 14 mg/24hr patch Place 1 patch (14 mg total) onto the skin daily.   traZODone (DESYREL) 50 MG tablet Take 1 tablet (50 mg total) by mouth at bedtime as needed for sleep.       03/29/2023    3:01 AM  Depression screen PHQ 2/9  Decreased Interest 1  Down, Depressed, Hopeless 1  PHQ - 2 Score 2  Altered sleeping 1  Tired, decreased energy 1  Change in appetite 1  Feeling bad or failure about yourself  1  Trouble concentrating 1  Moving slowly or fidgety/restless 1  Suicidal thoughts 1  PHQ-9 Score 9  Difficult doing work/chores Somewhat difficult    Flowsheet Row [facility] Most recent reading at 03/30/2023  7:31 AM [facility] Most recent reading at 03/28/2023  9:01 PM [facility] Most recent reading at 03/28/2023  5:30 PM  C-SSRS RISK CATEGORY No Risk High Risk Moderate Risk       Musculoskeletal  Strength & Muscle Tone: within normal limits Gait & Station: normal Patient leans: N/A  Psychiatric Specialty Exam  Presentation  General Appearance:  Disheveled  Eye Contact: Good  Speech: Clear and Coherent; Normal Rate  Speech  Volume: Normal  Handedness: Right   Mood and Affect  Mood: Depressed  Affect: Depressed   Thought Process  Thought Processes: Coherent; Goal Directed; Linear  Descriptions of Associations:Intact  Orientation:Full (Time, Place and Person)  Thought Content:Logical  Diagnosis of Schizophrenia or Schizoaffective disorder in past: No    Hallucinations:Hallucinations: None  Ideas of Reference:None  Suicidal Thoughts:Suicidal Thoughts: No  Homicidal Thoughts:Homicidal Thoughts: No   Sensorium  Memory: Immediate Good  Judgment: Fair  Insight: Present   Executive Functions  Concentration: Good  Attention Span: Good  Recall: Good  Fund of Knowledge: Good  Language: Good   Psychomotor Activity  Psychomotor Activity: Psychomotor Activity: Normal   Assets  Assets: Communication Skills; Desire for Improvement; Housing; Leisure Time; Resilience; Social Support   Sleep  Sleep: Sleep: Fair Number of Hours of Sleep: 4   Nutritional Assessment (For OBS and FBC admissions only) Has the patient had a weight loss or gain of 10 pounds or more in the last 3 months?: Yes Has the patient had a decrease in food intake/or appetite?: Yes Does the patient have dental problems?: No Does the patient have eating habits or behaviors that may be indicators of an eating disorder including binging or inducing vomiting?: No Has the patient recently lost weight without trying?: 0 Has the patient been eating poorly because of a decreased appetite?: 1 Malnutrition Screening Tool Score: 1 Nutritional Assessment Referrals: Other (comment)    Physical Exam  Physical Exam Vitals reviewed.  HENT:     Head: Normocephalic.     Nose: Nose normal.  Eyes:     Extraocular Movements: Extraocular movements intact.     Pupils: Pupils are equal, round, and reactive to light.  Cardiovascular:     Rate and Rhythm: Regular rhythm. Bradycardia present.  Pulmonary:      Effort: Pulmonary effort is normal.     Breath sounds: Normal breath sounds.  Musculoskeletal:     Cervical back: Normal range of motion.  Neurological:     General: No focal deficit present.     Mental Status: He is  alert.       Review of Systems  Psychiatric/Behavioral:  Positive for depression, substance abuse and suicidal ideas. The patient is nervous/anxious.    Blood pressure (!) 157/61, pulse (!) 57, temperature 98 F (36.7 C), temperature source Oral, resp. rate 18, SpO2 99 %. There is no height or weight on file to calculate BMI.  Demographic Factors:  Age 66 or older  Loss Factors: Decline in physical health  Historical Factors: NA  Risk Reduction Factors:   Positive family support from ex-wife and daughter   Continued Clinical Symptoms:  Depression:   Anhedonia Comorbid alcohol abuse/dependence Hopelessness Insomnia Alcohol/Substance Abuse/Dependencies Chronic Pain Medical Diagnoses and Treatments/Surgeries  Cognitive Features That Contribute To Risk:  None    Suicide Risk:  Extreme:  Frequent, intense, and enduring suicidal ideation, specific plans, clear subjective and objective intent, impaired self-control, severe dysphoria/symptomatology, many risk factors and no protective factors.  Plan Of Care/Follow-up recommendations:  Other:  Patient meets inpatient psychiatric treatment criteria and has been accepted to [place] in [place] for inpatient psychiatric treatment and stabilization.   Disposition: Transferred to [facility] via [employer]    Joaquin Courts, FNP-C, PMHNP-BC  Behavioral Health Service Line  [facility] [phone]  03/30/2023, 9:49 AM

## 2023-03-30 NOTE — ED Notes (Signed)
Patient requested pain medication it was given . Provider states that it was ok to give.

## 2023-05-17 ENCOUNTER — Other Ambulatory Visit: Payer: Self-pay

## 2023-05-17 ENCOUNTER — Emergency Department (HOSPITAL_COMMUNITY)
Admission: EM | Admit: 2023-05-17 | Discharge: 2023-05-17 | Disposition: A | Discharge status: Home / Self Care | Payer: Medicare PPO | Attending: Emergency Medicine | Admitting: Emergency Medicine

## 2023-05-17 DIAGNOSIS — R42 Dizziness and giddiness: Secondary | ICD-10-CM | POA: Diagnosis present

## 2023-05-17 DIAGNOSIS — W19XXXA Unspecified fall, initial encounter: Secondary | ICD-10-CM

## 2023-05-17 DIAGNOSIS — N179 Acute kidney failure, unspecified: Secondary | ICD-10-CM | POA: Diagnosis not present

## 2023-05-17 LAB — CBC WITH DIFFERENTIAL/PLATELET
Abs Immature Granulocytes: 0.01 10*3/uL (ref 0.00–0.07)
Basophils Absolute: 0.1 10*3/uL (ref 0.0–0.1)
Basophils Relative: 1 %
Eosinophils Absolute: 0.2 10*3/uL (ref 0.0–0.5)
Eosinophils Relative: 2 %
HCT: 32.3 % — ABNORMAL LOW (ref 39.0–52.0)
Hemoglobin: 9.9 g/dL — ABNORMAL LOW (ref 13.0–17.0)
Immature Granulocytes: 0 %
Lymphocytes Relative: 48 %
Lymphs Abs: 3.7 10*3/uL (ref 0.7–4.0)
MCH: 19.3 pg — ABNORMAL LOW (ref 26.0–34.0)
MCHC: 30.7 g/dL (ref 30.0–36.0)
MCV: 63 fL — ABNORMAL LOW (ref 80.0–100.0)
Monocytes Absolute: 0.7 10*3/uL (ref 0.1–1.0)
Monocytes Relative: 9 %
Neutro Abs: 3 10*3/uL (ref 1.7–7.7)
Neutrophils Relative %: 40 %
Platelets: 255 10*3/uL (ref 150–400)
RBC: 5.13 MIL/uL (ref 4.22–5.81)
RDW: 15.9 % — ABNORMAL HIGH (ref 11.5–15.5)
WBC: 7.6 10*3/uL (ref 4.0–10.5)
nRBC: 0 % (ref 0.0–0.2)

## 2023-05-17 LAB — COMPREHENSIVE METABOLIC PANEL
ALT: 12 U/L (ref 0–44)
AST: 19 U/L (ref 15–41)
Albumin: 3.3 g/dL — ABNORMAL LOW (ref 3.5–5.0)
Alkaline Phosphatase: 122 U/L (ref 38–126)
Anion gap: 12 (ref 5–15)
BUN: 33 mg/dL — ABNORMAL HIGH (ref 8–23)
CO2: 20 mmol/L — ABNORMAL LOW (ref 22–32)
Calcium: 8.8 mg/dL — ABNORMAL LOW (ref 8.9–10.3)
Chloride: 107 mmol/L (ref 98–111)
Creatinine, Ser: 2.26 mg/dL — ABNORMAL HIGH (ref 0.61–1.24)
GFR, Estimated: 31 mL/min — ABNORMAL LOW (ref >60)
Glucose, Bld: 93 mg/dL (ref 70–99)
Potassium: 3.6 mmol/L (ref 3.5–5.1)
Sodium: 139 mmol/L (ref 135–145)
Total Bilirubin: 0.5 mg/dL (ref 0.3–1.2)
Total Protein: 7.5 g/dL (ref 6.5–8.1)

## 2023-05-17 LAB — ETHANOL: Alcohol, Ethyl (B): 248 mg/dL — ABNORMAL HIGH (ref <10)

## 2023-05-17 MED ORDER — LACTATED RINGERS IV BOLUS: 1000.0000 mL | Freq: Once | INTRAVENOUS | Status: DC

## 2023-05-17 MED ORDER — LACTATED RINGERS IV BOLUS
1000.0000 mL | Freq: Once | INTRAVENOUS | Status: AC
  Administered 2023-05-17: 1000 mL via INTRAVENOUS
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
## 2023-05-17 NOTE — ED Notes (Signed)
Pt calling his daughter, states that he wants to go home. MD notified
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
## 2023-05-17 NOTE — ED Notes (Signed)
Pt's daughter at bedside. Pt once again requesting to be discharged. MD at bedside speaking to pt and daughter

## 2023-05-17 NOTE — Discharge Instructions (Addendum)
Your labs indicate that you have dehydration which is affecting your kidneys.  Do not take your Lasix tomorrow.  Be sure you are drinking plenty of fluids.  Otherwise follow-up closely with your nephrologist for recheck of your labs.  If you develop any other new or worsening symptoms then return to the ER, 1 1.

## 2023-09-24 ENCOUNTER — Emergency Department (HOSPITAL_COMMUNITY): Payer: No Typology Code available for payment source

## 2023-09-24 ENCOUNTER — Other Ambulatory Visit: Payer: Self-pay

## 2023-09-24 ENCOUNTER — Inpatient Hospital Stay (HOSPITAL_COMMUNITY)
Admission: EM | Admit: 2023-09-24 | Discharge: 2023-10-04 | DRG: 199 | Disposition: A | Discharge status: Home Health | Payer: No Typology Code available for payment source | Attending: General Surgery | Admitting: General Surgery

## 2023-09-24 ENCOUNTER — Encounter (HOSPITAL_COMMUNITY): Payer: Self-pay

## 2023-09-24 DIAGNOSIS — Z96653 Presence of artificial knee joint, bilateral: Secondary | ICD-10-CM | POA: Diagnosis present

## 2023-09-24 DIAGNOSIS — G894 Chronic pain syndrome: Secondary | ICD-10-CM | POA: Diagnosis present

## 2023-09-24 DIAGNOSIS — K5900 Constipation, unspecified: Secondary | ICD-10-CM | POA: Diagnosis not present

## 2023-09-24 DIAGNOSIS — J9621 Acute and chronic respiratory failure with hypoxia: Secondary | ICD-10-CM | POA: Diagnosis present

## 2023-09-24 DIAGNOSIS — J449 Chronic obstructive pulmonary disease, unspecified: Secondary | ICD-10-CM | POA: Diagnosis present

## 2023-09-24 DIAGNOSIS — Z79899 Other long term (current) drug therapy: Secondary | ICD-10-CM | POA: Diagnosis not present

## 2023-09-24 DIAGNOSIS — E875 Hyperkalemia: Secondary | ICD-10-CM | POA: Diagnosis present

## 2023-09-24 DIAGNOSIS — F17200 Nicotine dependence, unspecified, uncomplicated: Secondary | ICD-10-CM | POA: Diagnosis not present

## 2023-09-24 DIAGNOSIS — R001 Bradycardia, unspecified: Secondary | ICD-10-CM | POA: Diagnosis not present

## 2023-09-24 DIAGNOSIS — I5033 Acute on chronic diastolic (congestive) heart failure: Secondary | ICD-10-CM | POA: Diagnosis present

## 2023-09-24 DIAGNOSIS — D638 Anemia in other chronic diseases classified elsewhere: Secondary | ICD-10-CM | POA: Diagnosis present

## 2023-09-24 DIAGNOSIS — N179 Acute kidney failure, unspecified: Secondary | ICD-10-CM | POA: Diagnosis present

## 2023-09-24 DIAGNOSIS — F17210 Nicotine dependence, cigarettes, uncomplicated: Secondary | ICD-10-CM | POA: Diagnosis present

## 2023-09-24 DIAGNOSIS — Z7951 Long term (current) use of inhaled steroids: Secondary | ICD-10-CM | POA: Diagnosis not present

## 2023-09-24 DIAGNOSIS — Z8249 Family history of ischemic heart disease and other diseases of the circulatory system: Secondary | ICD-10-CM | POA: Diagnosis not present

## 2023-09-24 DIAGNOSIS — G4733 Obstructive sleep apnea (adult) (pediatric): Secondary | ICD-10-CM | POA: Diagnosis present

## 2023-09-24 DIAGNOSIS — N1832 Chronic kidney disease, stage 3b: Secondary | ICD-10-CM | POA: Diagnosis present

## 2023-09-24 DIAGNOSIS — I5032 Chronic diastolic (congestive) heart failure: Secondary | ICD-10-CM

## 2023-09-24 DIAGNOSIS — R0902 Hypoxemia: Secondary | ICD-10-CM

## 2023-09-24 DIAGNOSIS — S270XXA Traumatic pneumothorax, initial encounter: Principal | ICD-10-CM | POA: Diagnosis present

## 2023-09-24 DIAGNOSIS — J9811 Atelectasis: Secondary | ICD-10-CM | POA: Diagnosis present

## 2023-09-24 DIAGNOSIS — Z86711 Personal history of pulmonary embolism: Secondary | ICD-10-CM

## 2023-09-24 DIAGNOSIS — Z888 Allergy status to other drugs, medicaments and biological substances status: Secondary | ICD-10-CM

## 2023-09-24 DIAGNOSIS — Z9981 Dependence on supplemental oxygen: Secondary | ICD-10-CM

## 2023-09-24 DIAGNOSIS — J441 Chronic obstructive pulmonary disease with (acute) exacerbation: Secondary | ICD-10-CM | POA: Diagnosis not present

## 2023-09-24 DIAGNOSIS — I130 Hypertensive heart and chronic kidney disease with heart failure and stage 1 through stage 4 chronic kidney disease, or unspecified chronic kidney disease: Secondary | ICD-10-CM | POA: Diagnosis present

## 2023-09-24 DIAGNOSIS — Y92410 Unspecified street and highway as the place of occurrence of the external cause: Secondary | ICD-10-CM | POA: Diagnosis not present

## 2023-09-24 DIAGNOSIS — D509 Iron deficiency anemia, unspecified: Secondary | ICD-10-CM | POA: Diagnosis present

## 2023-09-24 DIAGNOSIS — S2241XA Multiple fractures of ribs, right side, initial encounter for closed fracture: Secondary | ICD-10-CM | POA: Diagnosis present

## 2023-09-24 DIAGNOSIS — E785 Hyperlipidemia, unspecified: Secondary | ICD-10-CM | POA: Diagnosis present

## 2023-09-24 DIAGNOSIS — S2249XA Multiple fractures of ribs, unspecified side, initial encounter for closed fracture: Secondary | ICD-10-CM | POA: Diagnosis present

## 2023-09-24 DIAGNOSIS — J939 Pneumothorax, unspecified: Secondary | ICD-10-CM

## 2023-09-24 DIAGNOSIS — Z716 Tobacco abuse counseling: Secondary | ICD-10-CM

## 2023-09-24 DIAGNOSIS — I509 Heart failure, unspecified: Secondary | ICD-10-CM

## 2023-09-24 LAB — BASIC METABOLIC PANEL
Anion gap: 10 (ref 5–15)
BUN: 50 mg/dL — ABNORMAL HIGH (ref 8–23)
CO2: 26 mmol/L (ref 22–32)
Calcium: 8.8 mg/dL — ABNORMAL LOW (ref 8.9–10.3)
Chloride: 99 mmol/L (ref 98–111)
Creatinine, Ser: 3.6 mg/dL — ABNORMAL HIGH (ref 0.61–1.24)
GFR, Estimated: 18 mL/min — ABNORMAL LOW (ref >60)
Glucose, Bld: 102 mg/dL — ABNORMAL HIGH (ref 70–99)
Potassium: 5.1 mmol/L (ref 3.5–5.1)
Sodium: 135 mmol/L (ref 135–145)

## 2023-09-24 LAB — CBC
HCT: 27 % — ABNORMAL LOW (ref 39.0–52.0)
Hemoglobin: 8.5 g/dL — ABNORMAL LOW (ref 13.0–17.0)
MCH: 19.7 pg — ABNORMAL LOW (ref 26.0–34.0)
MCHC: 31.5 g/dL (ref 30.0–36.0)
MCV: 62.6 fL — ABNORMAL LOW (ref 80.0–100.0)
Platelets: 225 10*3/uL (ref 150–400)
RBC: 4.31 MIL/uL (ref 4.22–5.81)
RDW: 16.1 % — ABNORMAL HIGH (ref 11.5–15.5)
WBC: 8.3 10*3/uL (ref 4.0–10.5)
nRBC: 0 % (ref 0.0–0.2)

## 2023-09-24 MED ORDER — METHOCARBAMOL 1000 MG/10ML IJ SOLN
500.0000 mg | Freq: Three times a day (TID) | INTRAVENOUS | Status: DC
  Filled 2023-09-24 (×4): qty 5

## 2023-09-24 MED ORDER — HYDRALAZINE HCL 20 MG/ML IJ SOLN
10.0000 mg | INTRAMUSCULAR | Status: DC | PRN
  Administered 2023-09-25 – 2023-10-03 (×2): 10 mg via INTRAVENOUS
  Filled 2023-09-24 (×2): qty 1

## 2023-09-24 MED ORDER — ACETAMINOPHEN 500 MG PO TABS
1000.0000 mg | ORAL_TABLET | Freq: Four times a day (QID) | ORAL | Status: DC
  Administered 2023-09-25 – 2023-10-04 (×32): 1000 mg via ORAL
  Filled 2023-09-24 (×35): qty 2

## 2023-09-24 MED ORDER — METOPROLOL TARTRATE 5 MG/5ML IV SOLN: 5.0000 mg | Freq: Four times a day (QID) | INTRAVENOUS | Status: DC | PRN

## 2023-09-24 MED ORDER — OXYCODONE HCL 5 MG PO TABS: 5.0000 mg | ORAL_TABLET | ORAL | Status: DC | PRN

## 2023-09-24 MED ORDER — ONDANSETRON HCL 4 MG/2ML IJ SOLN
4.0000 mg | Freq: Four times a day (QID) | INTRAMUSCULAR | Status: DC | PRN
  Filled 2023-09-24: qty 2

## 2023-09-24 MED ORDER — HYDROCODONE-ACETAMINOPHEN 5-325 MG PO TABS
2.0000 | ORAL_TABLET | Freq: Once | ORAL | Status: AC
  Administered 2023-09-24: 2 via ORAL
  Filled 2023-09-24: qty 2

## 2023-09-24 MED ORDER — ONDANSETRON 4 MG PO TBDP: 4.0000 mg | ORAL_TABLET | Freq: Four times a day (QID) | ORAL | Status: DC | PRN

## 2023-09-24 MED ORDER — ENOXAPARIN SODIUM 30 MG/0.3ML IJ SOSY: 30.0000 mg | PREFILLED_SYRINGE | INTRAMUSCULAR | Status: DC

## 2023-09-24 MED ORDER — POLYETHYLENE GLYCOL 3350 17 G PO PACK: 17.0000 g | PACK | Freq: Every day | ORAL | Status: DC | PRN

## 2023-09-24 MED ORDER — METHOCARBAMOL 500 MG PO TABS
500.0000 mg | ORAL_TABLET | Freq: Three times a day (TID) | ORAL | Status: DC
  Administered 2023-09-25 – 2023-09-26 (×5): 500 mg via ORAL
  Filled 2023-09-24 (×5): qty 1

## 2023-09-24 MED ORDER — OXYCODONE HCL 5 MG PO TABS
10.0000 mg | ORAL_TABLET | ORAL | Status: DC | PRN
  Administered 2023-09-25: 10 mg via ORAL
  Filled 2023-09-24: qty 2

## 2023-09-24 MED ORDER — MORPHINE SULFATE (PF) 4 MG/ML IV SOLN
4.0000 mg | Freq: Once | INTRAVENOUS | Status: AC
  Administered 2023-09-24: 4 mg via INTRAVENOUS
  Filled 2023-09-24: qty 1

## 2023-09-24 MED ORDER — ONDANSETRON HCL 4 MG/2ML IJ SOLN
4.0000 mg | Freq: Once | INTRAMUSCULAR | Status: AC
  Administered 2023-09-24: 4 mg via INTRAVENOUS
  Filled 2023-09-24: qty 2

## 2023-09-24 MED ORDER — SODIUM CHLORIDE 0.9 % IV BOLUS
1000.0000 mL | Freq: Once | INTRAVENOUS | Status: AC
  Administered 2023-09-24: 1000 mL via INTRAVENOUS

## 2023-09-24 MED ORDER — DOCUSATE SODIUM 100 MG PO CAPS
100.0000 mg | ORAL_CAPSULE | Freq: Two times a day (BID) | ORAL | Status: DC
  Administered 2023-09-25 – 2023-09-26 (×4): 100 mg via ORAL
  Filled 2023-09-24 (×4): qty 1

## 2023-09-24 MED ORDER — HYDROMORPHONE HCL 1 MG/ML IJ SOLN
1.0000 mg | INTRAMUSCULAR | Status: DC | PRN
  Administered 2023-09-25 – 2023-09-29 (×22): 1 mg via INTRAVENOUS
  Filled 2023-09-24 (×22): qty 1

## 2023-09-24 NOTE — ED Notes (Signed)
Contacted CT in regards to pts going for scan. They state they have everything they need and should call for pt in next 30 mins or so.
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
## 2023-09-25 ENCOUNTER — Inpatient Hospital Stay (HOSPITAL_COMMUNITY): Payer: No Typology Code available for payment source

## 2023-09-25 DIAGNOSIS — S2241XA Multiple fractures of ribs, right side, initial encounter for closed fracture: Secondary | ICD-10-CM | POA: Diagnosis not present

## 2023-09-25 DIAGNOSIS — I5032 Chronic diastolic (congestive) heart failure: Secondary | ICD-10-CM

## 2023-09-25 DIAGNOSIS — I509 Heart failure, unspecified: Secondary | ICD-10-CM

## 2023-09-25 LAB — MAGNESIUM: Magnesium: 2.3 mg/dL (ref 1.7–2.4)

## 2023-09-25 LAB — CBC
HCT: 25.6 % — ABNORMAL LOW (ref 39.0–52.0)
Hemoglobin: 7.8 g/dL — ABNORMAL LOW (ref 13.0–17.0)
MCH: 19.2 pg — ABNORMAL LOW (ref 26.0–34.0)
MCHC: 30.5 g/dL (ref 30.0–36.0)
MCV: 62.9 fL — ABNORMAL LOW (ref 80.0–100.0)
Platelets: 194 10*3/uL (ref 150–400)
RBC: 4.07 MIL/uL — ABNORMAL LOW (ref 4.22–5.81)
RDW: 16.2 % — ABNORMAL HIGH (ref 11.5–15.5)
WBC: 9.5 10*3/uL (ref 4.0–10.5)
nRBC: 0 % (ref 0.0–0.2)

## 2023-09-25 LAB — HEPATIC FUNCTION PANEL
ALT: 15 U/L (ref 0–44)
AST: 24 U/L (ref 15–41)
Albumin: 3.1 g/dL — ABNORMAL LOW (ref 3.5–5.0)
Alkaline Phosphatase: 87 U/L (ref 38–126)
Bilirubin, Direct: <0.1 mg/dL (ref 0.0–0.2)
Total Bilirubin: 0.4 mg/dL (ref 0.3–1.2)
Total Protein: 6.7 g/dL (ref 6.5–8.1)

## 2023-09-25 LAB — BASIC METABOLIC PANEL
Anion gap: 9 (ref 5–15)
BUN: 52 mg/dL — ABNORMAL HIGH (ref 8–23)
CO2: 22 mmol/L (ref 22–32)
Calcium: 8.4 mg/dL — ABNORMAL LOW (ref 8.9–10.3)
Chloride: 105 mmol/L (ref 98–111)
Creatinine, Ser: 3.11 mg/dL — ABNORMAL HIGH (ref 0.61–1.24)
GFR, Estimated: 21 mL/min — ABNORMAL LOW (ref >60)
Glucose, Bld: 92 mg/dL (ref 70–99)
Potassium: 4.9 mmol/L (ref 3.5–5.1)
Sodium: 136 mmol/L (ref 135–145)

## 2023-09-25 LAB — RETICULOCYTES
Immature Retic Fract: 12.5 % (ref 2.3–15.9)
RBC.: 3.86 MIL/uL — ABNORMAL LOW (ref 4.22–5.81)
Retic Count, Absolute: 41.7 10*3/uL (ref 19.0–186.0)
Retic Ct Pct: 1.1 % (ref 0.4–3.1)

## 2023-09-25 LAB — BRAIN NATRIURETIC PEPTIDE: B Natriuretic Peptide: 122.7 pg/mL — ABNORMAL HIGH (ref 0.0–100.0)

## 2023-09-25 LAB — IRON AND TIBC
Iron: 46 ug/dL (ref 45–182)
Saturation Ratios: 18 % (ref 17.9–39.5)
TIBC: 255 ug/dL (ref 250–450)
UIBC: 209 ug/dL

## 2023-09-25 LAB — HIV ANTIBODY (ROUTINE TESTING W REFLEX): HIV Screen 4th Generation wRfx: NONREACTIVE

## 2023-09-25 LAB — SODIUM, URINE, RANDOM: Sodium, Ur: 41 mmol/L

## 2023-09-25 LAB — FERRITIN: Ferritin: 254 ng/mL (ref 24–336)

## 2023-09-25 LAB — CK: Total CK: 219 U/L (ref 49–397)

## 2023-09-25 LAB — VITAMIN B12: Vitamin B-12: 352 pg/mL (ref 180–914)

## 2023-09-25 LAB — PHOSPHORUS: Phosphorus: 4.9 mg/dL — ABNORMAL HIGH (ref 2.5–4.6)

## 2023-09-25 LAB — FOLATE: Folate: 8.6 ng/mL (ref >5.9)

## 2023-09-25 MED ORDER — CLONIDINE HCL 0.1 MG PO TABS
0.2000 mg | ORAL_TABLET | Freq: Two times a day (BID) | ORAL | Status: DC
  Administered 2023-09-25 – 2023-10-03 (×18): 0.2 mg via ORAL
  Filled 2023-09-25: qty 1
  Filled 2023-09-25: qty 2
  Filled 2023-09-25 (×5): qty 1
  Filled 2023-09-25 (×2): qty 2
  Filled 2023-09-25 (×4): qty 1
  Filled 2023-09-25 (×2): qty 2
  Filled 2023-09-25: qty 1
  Filled 2023-09-25: qty 2
  Filled 2023-09-25: qty 1

## 2023-09-25 MED ORDER — OXYCODONE HCL 5 MG PO TABS
15.0000 mg | ORAL_TABLET | ORAL | Status: DC | PRN
  Administered 2023-09-25 – 2023-09-30 (×19): 15 mg via ORAL
  Filled 2023-09-25 (×20): qty 3

## 2023-09-25 MED ORDER — HYDRALAZINE HCL 25 MG PO TABS
100.0000 mg | ORAL_TABLET | Freq: Three times a day (TID) | ORAL | Status: DC
  Administered 2023-09-25 – 2023-10-04 (×28): 100 mg via ORAL
  Filled 2023-09-25 (×28): qty 4

## 2023-09-25 MED ORDER — OXYCODONE HCL 5 MG PO TABS
10.0000 mg | ORAL_TABLET | ORAL | Status: DC | PRN
  Administered 2023-09-25 – 2023-09-26 (×2): 10 mg via ORAL
  Filled 2023-09-25 (×2): qty 2

## 2023-09-25 MED ORDER — LORAZEPAM 1 MG PO TABS: 1.0000 mg | ORAL_TABLET | ORAL | Status: AC | PRN

## 2023-09-25 MED ORDER — NICOTINE POLACRILEX 2 MG MT GUM: 2.0000 mg | CHEWING_GUM | OROMUCOSAL | Status: DC | PRN

## 2023-09-25 MED ORDER — LACTATED RINGERS IV SOLN: INTRAVENOUS | Status: DC

## 2023-09-25 MED ORDER — ADULT MULTIVITAMIN W/MINERALS CH
1.0000 | ORAL_TABLET | Freq: Every day | ORAL | Status: DC
  Administered 2023-09-25 – 2023-10-04 (×10): 1 via ORAL
  Filled 2023-09-25 (×10): qty 1

## 2023-09-25 MED ORDER — NICOTINE 14 MG/24HR TD PT24: 14.0000 mg | MEDICATED_PATCH | Freq: Every day | TRANSDERMAL | Status: DC | PRN

## 2023-09-25 MED ORDER — THIAMINE HCL 100 MG/ML IJ SOLN: 100.0000 mg | Freq: Every day | INTRAMUSCULAR | Status: DC

## 2023-09-25 MED ORDER — HEPARIN SODIUM (PORCINE) 5000 UNIT/ML IJ SOLN
5000.0000 [IU] | Freq: Three times a day (TID) | INTRAMUSCULAR | Status: DC
  Administered 2023-09-25 – 2023-10-04 (×28): 5000 [IU] via SUBCUTANEOUS
  Filled 2023-09-25 (×28): qty 1

## 2023-09-25 MED ORDER — THIAMINE MONONITRATE 100 MG PO TABS
100.0000 mg | ORAL_TABLET | Freq: Every day | ORAL | Status: DC
  Administered 2023-09-25 – 2023-10-04 (×10): 100 mg via ORAL
  Filled 2023-09-25 (×10): qty 1

## 2023-09-25 MED ORDER — LABETALOL HCL 5 MG/ML IV SOLN: 10.0000 mg | INTRAVENOUS | Status: DC | PRN

## 2023-09-25 MED ORDER — IPRATROPIUM-ALBUTEROL 0.5-2.5 (3) MG/3ML IN SOLN
3.0000 mL | Freq: Four times a day (QID) | RESPIRATORY_TRACT | Status: DC
  Administered 2023-09-25 – 2023-10-01 (×24): 3 mL via RESPIRATORY_TRACT
  Filled 2023-09-25 (×24): qty 3

## 2023-09-25 MED ORDER — LORAZEPAM 2 MG/ML IJ SOLN: 1.0000 mg | INTRAMUSCULAR | Status: AC | PRN

## 2023-09-25 MED ORDER — FOLIC ACID 1 MG PO TABS
1.0000 mg | ORAL_TABLET | Freq: Every day | ORAL | Status: DC
  Administered 2023-09-25 – 2023-10-04 (×10): 1 mg via ORAL
  Filled 2023-09-25 (×10): qty 1
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
## 2023-09-25 NOTE — Progress Notes (Signed)
Progress Note     Subjective: Having some expected pain in his ribs from fractures. Mild SHOB on supplemental O2. He states he uses 6lpm supp O2 at home on an as needed bases in setting of history of COPD. His normal O2 saturation stays above 90%. He denies n/v/abd pain. He takes oxycodone 10 mg 5 times a day at home for chronic knee pain. He usually takes 20 mg first dose in the morning for worst pain.  He has history of alcohol use and withdrawal. He denies daily drinking or alcohol intake in the last several days. He is a current cigarette smoker.  Sister in law is at bedside  Objective: Vital signs in last 24 hours: Temp:  [97.9 F (36.6 C)-99.5 F (37.5 C)] 97.9 F (36.6 C) (10/02 0446) Pulse Rate:  [51-69] 64 (10/02 0430) Resp:  [12-21] 13 (10/02 0430) BP: (108-198)/(56-99) 198/79 (10/02 0613) SpO2:  [89 %-100 %] 93 % (10/02 0430) Weight:  [90.7 kg] 90.7 kg (10/01 1917)    Intake/Output from previous day: No intake/output data recorded. Intake/Output this shift: No intake/output data recorded.  PE: General: pleasant, WD, male who is laying in bed in NAD HEENT: head is normocephalic, atraumatic.  Heart: regular, rate, and rhythm.  Palpable radial  pulses bilaterally Lungs: CTAB, no wheezes, rhonchi, or rales noted.  Respiratory effort nonlabored on 6lpm supplemental O2. O2 sats drop to high 80s with movement while I am in the room Abd: soft, NT, ND, +BS, no masses, hernias, or organomegaly MSK: bilateral lower extremity edema L>R Skin: warm and dry with no masses, lesions, or rashes Psych: A&Ox3 with an appropriate affect.    Lab Results:  Recent Labs    09/24/23 1920 09/25/23 0438  WBC 8.3 9.5  HGB 8.5* 7.8*  HCT 27.0* 25.6*  PLT 225 194   BMET Recent Labs    09/24/23 1920 09/25/23 0438  NA 135 136  K 5.1 4.9  CL 99 105  CO2 26 22  GLUCOSE 102* 92  BUN 50* 52*  CREATININE 3.60* 3.11*  CALCIUM 8.8* 8.4*   PT/INR No results for input(s):  "LABPROT", "INR" in the last 72 hours. CMP     Component Value Date/Time   NA 136 09/25/2023 0438   K 4.9 09/25/2023 0438   CL 105 09/25/2023 0438   CO2 22 09/25/2023 0438   GLUCOSE 92 09/25/2023 0438   BUN 52 (H) 09/25/2023 0438   CREATININE 3.11 (H) 09/25/2023 0438   CALCIUM 8.4 (L) 09/25/2023 0438   PROT 6.7 09/24/2023 2348   ALBUMIN 3.1 (L) 09/24/2023 2348   AST 24 09/24/2023 2348   ALT 15 09/24/2023 2348   ALKPHOS 87 09/24/2023 2348   BILITOT 0.4 09/24/2023 2348   GFRNONAA 21 (L) 09/25/2023 0438   GFRAA 53 (L) 12/06/2018 0620   Lipase     Component Value Date/Time   LIPASE 76 (H) 02/09/2011 0550       Studies/Results: DG Chest Port 1 View  Result Date: 09/25/2023 CLINICAL DATA:  [id number]. Follow-up for right pneumothorax and rib fractures. EXAM: PORTABLE CHEST 1 VIEW COMPARISON:  Chest CT without contrast yesterday at 10:30 p.m. FINDINGS: 5:32 a.m. There is a small right apical pneumothorax. There appears to be a right lateral basal/subpulmonic pneumothorax which was also seen on the CT. Total volume of pneumothorax is estimated about 5%. The lungs are mildly emphysematous. There is patchy consolidation, atelectasis or contusions in both lower lung fields. The upper and mid lung regions are  generally clear. No new infiltrate is seen. There is no substantial pleural effusion. The heart is slightly enlarged. The central vessels are normal caliber. There is aortic atherosclerosis with stable mediastinum. CT demonstrated lateral fractures of the right third through tenth ribs which were better seen on CT. No new fracture has become apparent. Multilevel bridging enthesopathy noted thoracic spine. IMPRESSION: 1. Small right apical and lateral basal pneumothorax in total estimated about 5% volume. No appreciable change from prior CT. 2. Patchy consolidation, atelectasis or contusions in both lower lung fields. 3. Aortic atherosclerosis. 4. CT demonstrated lateral fractures of the right  third through tenth ribs which were better seen on CT. No new fracture has become apparent. Electronically Signed   By: Almira Bar M.D.   On: 09/25/2023 06:08   CT CHEST ABDOMEN PELVIS WO CONTRAST  Result Date: 09/24/2023 CLINICAL DATA:  Poly trauma, blunt. Shortness of breath. Rib injuries. EXAM: CT CHEST, ABDOMEN AND PELVIS WITHOUT CONTRAST TECHNIQUE: Multidetector CT imaging of the chest, abdomen and pelvis was performed following the standard protocol without IV contrast. RADIATION DOSE REDUCTION: This exam was performed according to the departmental dose-optimization program which includes automated exposure control, adjustment of the mA and/or kV according to patient size and/or use of iterative reconstruction technique. COMPARISON:  CT chest 07/09/2022 FINDINGS: CT CHEST FINDINGS Cardiovascular: Mild cardiac enlargement. No pericardial effusions. Normal caliber thoracic aorta. Calcification of the aorta and coronary arteries. Mediastinum/Nodes: Thyroid gland is unremarkable. Esophagus is decompressed. Left hilar prominence is suggested. This is not well evaluated due to lack of IV contrast material and could be vascular or may indicate lymphadenopathy. Additional scattered mediastinal lymph nodes are not pathologically enlarged, likely reactive. Lungs/Pleura: Small right pneumothorax. Emphysematous changes in both lungs. Scattered fibrosis. Consolidation or atelectasis in the lung bases possibly pneumonia, contusion, or compressive atelectasis. No pleural effusions. Musculoskeletal: Fractures of the right lateral third, fourth, fifth, sixth, seventh, eighth, ninth, and tenth ribs with displacement of the fourth, fifth, and sixth ribs. Sternum appears intact. Degenerative changes in the spine. No vertebral compression deformities. CT ABDOMEN PELVIS FINDINGS Hepatobiliary: No focal liver abnormality is seen. Status post cholecystectomy. No biliary dilatation. Pancreas: Unremarkable. No pancreatic ductal  dilatation or surrounding inflammatory changes. Spleen: No splenic injury or perisplenic hematoma. Adrenals/Urinary Tract: No adrenal hemorrhage or renal injury identified. Bladder is unremarkable. Stomach/Bowel: Stomach is within normal limits. Appendix appears normal. No evidence of bowel wall thickening, distention, or inflammatory changes. Vascular/Lymphatic: Aortic atherosclerosis. No enlarged abdominal or pelvic lymph nodes. Reproductive: Prostate is unremarkable. Other: No abdominal wall hernia or abnormality. No abdominopelvic ascites. Musculoskeletal: Degenerative changes in the spine. No acute fractures are identified. IMPRESSION: 1. Fractures of the right third through tenth ribs with small right pneumothorax. No tension or collapse. 2. Consolidation or atelectasis in both lower lungs. Three possible left hilar lymphadenopathy although difficult to ascertain without IV contrast material. 3. No acute posttraumatic changes demonstrated in the abdomen or pelvis. 4. Emphysematous changes in the lungs.  Aortic atherosclerosis. Critical Value/emergent results were called by telephone at the time of interpretation on 09/24/2023 at 11:19 pm to provider Dr. Jeraldine Loots , who verbally acknowledged these results. Electronically Signed   By: Burman Nieves M.D.   On: 09/24/2023 23:31   DG Pelvis Portable  Result Date: 09/24/2023 CLINICAL DATA:  Trauma fall EXAM: PORTABLE PELVIS 1-2 VIEWS COMPARISON:  None Available. FINDINGS: SI joints are non widened. Pubic symphysis and rami appear intact. No fracture or malalignment. Mild hip degenerative change bilaterally. Vascular calcifications.  IMPRESSION: No acute osseous abnormality. Electronically Signed   By: Jasmine Pang M.D.   On: 09/24/2023 21:19   DG Chest 2 View  Result Date: 09/24/2023 CLINICAL DATA:  Chest pain EXAM: CHEST - 2 VIEW COMPARISON:  07/13/2022 FINDINGS: Mild reticular interstitial opacities at the bases, likely due to chronic change. Subsegmental  atelectasis. No focal airspace disease or effusion. Stable cardiomediastinal silhouette. Question of tiny right apical pneumothorax. Aortic atherosclerosis. Possible acute displaced right seventh lateral rib fracture. IMPRESSION: Chronic changes and subsegmental atelectasis. Findings suspicious for an acute displaced right seventh rib fracture Critical Value/emergent results were called by telephone at the time of interpretation on 09/24/2023 at 9:17 pm to provider Dr. Doran Durand, who verbally acknowledged these results. Electronically Signed   By: Jasmine Pang M.D.   On: 09/24/2023 21:17    Anti-infectives: Anti-infectives (From admission, onward)    None        Assessment/Plan [facility]  Right 3-10th rib fractures with small pneumothorax - Repeat CXR this am with stable PTX. cont continuous pulse ox, incentive spirometer, O2 via nasal canula. Duonebs. PT/OT Anemia - no acute blood loss to explain on imaging, likely anemia of chronic disease. Hgb down to 7.8 this am - will recheck am Pain control - on oxycodone 10 mg 5 times daily at baseline. Increase oxycodone prn scale to 10-15mg . Continue multimodal pain control AKI on CKD stage III - creatinine a little better today. IVF per TRH. Avoiding nephrotoxic agents. Recheck bmp am HTN - home meds ordered. TRH consulting LE edema, h/o HFpEF - monitor - about at baseline today per patient. BNP 122. TRH consulting H/o COPD - on 6 lpm supp O2 prn at home Tobacco use - nicotine patch, nicotine gum H/o alcohol use disorder - has experienced withdrawal in the past. CIWA H/o PE - no longer on anticoagulation  Appreciate [facility] assistance with multiple medical comorbidities  FEN: renal diet, LR at 23ml/h ID: none VTE: hep subq Dispo: med surg. Pain control. Therapies. Wean O2 as able  I reviewed hospitalist notes, last 24 h vitals and pain scores, last 48 h intake and output, last 24 h labs and trends, and last 24 h imaging results.     LOS: 1 day    Eric Form, [facility] 09/25/2023, 7:39 AM Please see Amion for pager number during day hours 7:00am-4:30pm

## 2023-09-25 NOTE — TOC CAGE-AID Note (Signed)
Transition of Care [facility]) - CAGE-AID Screening   Patient Details  Name: Jeremy Douglas MRN: [id number] Date of Birth: [birth year]  Transition of Care [facility]) CM/SW Contact:    Leota Sauers, RN Phone Number: 09/25/2023, 6:01 AM   Clinical Narrative:  Patient endorses some alcohol use, denies illicit drug use. Resources not given at this time.  CAGE-AID Screening:    Have You Ever Felt You Ought to Cut Down on Your Drinking or Drug Use?: No Have People Annoyed You By Critizing Your Drinking Or Drug Use?: No Have You Felt Bad Or Guilty About Your Drinking Or Drug Use?: No Have You Ever Had a Drink or Used Drugs First Thing In The Morning to Steady Your Nerves or to Get Rid of a Hangover?: No CAGE-AID Score: 0  Substance Abuse Education Offered: No

## 2023-09-25 NOTE — ED Notes (Signed)
Patient states last drink was 2 days ago. Patient denies drinking alcohol everyday, and being a heavy drinker.

## 2023-09-25 NOTE — Plan of Care (Signed)
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
## 2023-09-26 ENCOUNTER — Inpatient Hospital Stay (HOSPITAL_COMMUNITY): Payer: No Typology Code available for payment source

## 2023-09-26 DIAGNOSIS — S2241XA Multiple fractures of ribs, right side, initial encounter for closed fracture: Secondary | ICD-10-CM | POA: Diagnosis not present

## 2023-09-26 LAB — CBC
HCT: 27.1 % — ABNORMAL LOW (ref 39.0–52.0)
Hemoglobin: 8.2 g/dL — ABNORMAL LOW (ref 13.0–17.0)
MCH: 18.9 pg — ABNORMAL LOW (ref 26.0–34.0)
MCHC: 30.3 g/dL (ref 30.0–36.0)
MCV: 62.3 fL — ABNORMAL LOW (ref 80.0–100.0)
Platelets: 209 10*3/uL (ref 150–400)
RBC: 4.35 MIL/uL (ref 4.22–5.81)
RDW: 16.1 % — ABNORMAL HIGH (ref 11.5–15.5)
WBC: 8.7 10*3/uL (ref 4.0–10.5)
nRBC: 0 % (ref 0.0–0.2)

## 2023-09-26 LAB — BASIC METABOLIC PANEL
Anion gap: 10 (ref 5–15)
BUN: 39 mg/dL — ABNORMAL HIGH (ref 8–23)
CO2: 21 mmol/L — ABNORMAL LOW (ref 22–32)
Calcium: 8.7 mg/dL — ABNORMAL LOW (ref 8.9–10.3)
Chloride: 106 mmol/L (ref 98–111)
Creatinine, Ser: 2.31 mg/dL — ABNORMAL HIGH (ref 0.61–1.24)
GFR, Estimated: 30 mL/min — ABNORMAL LOW (ref >60)
Glucose, Bld: 102 mg/dL — ABNORMAL HIGH (ref 70–99)
Potassium: 4.3 mmol/L (ref 3.5–5.1)
Sodium: 137 mmol/L (ref 135–145)

## 2023-09-26 LAB — BLOOD GAS, ARTERIAL
Acid-base deficit: 0.8 mmol/L (ref 0.0–2.0)
Acid-base deficit: 0.6 mmol/L (ref 0.0–2.0)
Bicarbonate: 24.9 mmol/L (ref 20.0–28.0)
Bicarbonate: 24.9 mmol/L (ref 20.0–28.0)
O2 Saturation: 93.7 %
O2 Saturation: 99 %
Patient temperature: 36.9
Patient temperature: 37.4
pCO2 arterial: 44 mm[Hg] (ref 32–48)
pCO2 arterial: 45 mm[Hg] (ref 32–48)
pH, Arterial: 7.36 (ref 7.35–7.45)
pH, Arterial: 7.35 (ref 7.35–7.45)
pO2, Arterial: 60 mm[Hg] — ABNORMAL LOW (ref 83–108)
pO2, Arterial: 76 mm[Hg] — ABNORMAL LOW (ref 83–108)

## 2023-09-26 LAB — TYPE AND SCREEN
ABO/RH(D): A POS
Antibody Screen: NEGATIVE

## 2023-09-26 LAB — BRAIN NATRIURETIC PEPTIDE: B Natriuretic Peptide: 685.6 pg/mL — ABNORMAL HIGH (ref 0.0–100.0)

## 2023-09-26 MED ORDER — LABETALOL HCL 5 MG/ML IV SOLN
10.0000 mg | INTRAVENOUS | Status: DC | PRN
  Administered 2023-09-29: 10 mg via INTRAVENOUS
  Filled 2023-09-26: qty 4

## 2023-09-26 MED ORDER — METHOCARBAMOL 500 MG PO TABS
750.0000 mg | ORAL_TABLET | Freq: Four times a day (QID) | ORAL | Status: DC
  Administered 2023-09-26 (×3): 750 mg via ORAL
  Filled 2023-09-26 (×3): qty 2
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
## 2023-09-26 NOTE — Progress Notes (Signed)
  Progress Note   Date: 09/26/2023  Patient Name: Jeremy Douglas        MRN#: [id number]  Clarification of diagnosis:CKD 3b
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
## 2023-09-26 NOTE — Plan of Care (Signed)
Patient alert x4 able to make all needs known now on heated high flow pain related to recent rib fractures. Problem: Education: Goal: Knowledge of General Education information will improve Description: Including pain rating scale, medication(s)/side effects and non-pharmacologic comfort measures 09/26/2023 1302 by Aretta Nip, RN Outcome: Progressing 09/26/2023 1301 by Aretta Nip, RN Outcome: Progressing   Problem: Health Behavior/Discharge Planning: Goal: Ability to manage health-related needs will improve 09/26/2023 1302 by Aretta Nip, RN Outcome: Progressing 09/26/2023 1301 by Aretta Nip, RN Outcome: Progressing   Problem: Clinical Measurements: Goal: Ability to maintain clinical measurements within normal limits will improve 09/26/2023 1302 by Aretta Nip, RN Outcome: Progressing 09/26/2023 1301 by Aretta Nip, RN Outcome: Progressing Goal: Will remain free from infection 09/26/2023 1302 by Aretta Nip, RN Outcome: Progressing 09/26/2023 1301 by Aretta Nip, RN Outcome: Progressing Goal: Diagnostic test results will improve 09/26/2023 1302 by Aretta Nip, RN Outcome: Progressing 09/26/2023 1301 by Aretta Nip, RN Outcome: Progressing Goal: Respiratory complications will improve 09/26/2023 1302 by Aretta Nip, RN Outcome: Progressing 09/26/2023 1301 by Aretta Nip, RN Outcome: Progressing Goal: Cardiovascular complication will be avoided 09/26/2023 1302 by Aretta Nip, RN Outcome: Progressing 09/26/2023 1301 by Aretta Nip, RN Outcome: Progressing   Problem: Activity: Goal: Risk for activity intolerance will decrease 09/26/2023 1302 by Aretta Nip, RN Outcome: Not Progressing Note: R/t SOB and pain 09/26/2023 1301 by Aretta Nip, RN Outcome: Not Progressing Note: R/t sob and pain   Problem: Nutrition: Goal: Adequate nutrition will be maintained 09/26/2023 1302 by Aretta Nip, RN Outcome:  Progressing 09/26/2023 1301 by Aretta Nip, RN Outcome: Progressing   Problem: Coping: Goal: Level of anxiety will decrease 09/26/2023 1302 by Aretta Nip, RN Outcome: Progressing 09/26/2023 1301 by Aretta Nip, RN Outcome: Progressing   Problem: Elimination: Goal: Will not experience complications related to bowel motility 09/26/2023 1302 by Aretta Nip, RN Outcome: Progressing 09/26/2023 1301 by Aretta Nip, RN Outcome: Progressing Goal: Will not experience complications related to urinary retention 09/26/2023 1302 by Aretta Nip, RN Outcome: Progressing 09/26/2023 1301 by Aretta Nip, RN Outcome: Progressing   Problem: Pain Managment: Goal: General experience of comfort will improve 09/26/2023 1302 by Aretta Nip, RN Outcome: Not Progressing Note: R/t ib fractures 09/26/2023 1301 by Aretta Nip, RN Outcome: Not Progressing Note: R/t rib fractures   Problem: Safety: Goal: Ability to remain free from injury will improve 09/26/2023 1302 by Aretta Nip, RN Outcome: Progressing 09/26/2023 1301 by Aretta Nip, RN Outcome: Progressing   Problem: Skin Integrity: Goal: Risk for impaired skin integrity will decrease 09/26/2023 1302 by Aretta Nip, RN Outcome: Progressing 09/26/2023 1301 by Aretta Nip, RN Outcome: Progressing   Problem: Education: Goal: Knowledge of disease or condition will improve Outcome: Progressing Goal: Knowledge of the prescribed therapeutic regimen will improve Outcome: Progressing Goal: Individualized Educational Video(s) Outcome: Progressing   Problem: Activity: Goal: Ability to tolerate increased activity will improve Outcome: Not Progressing Goal: Will verbalize the importance of balancing activity with adequate rest periods Outcome: Not Progressing   Problem: Respiratory: Goal: Ability to maintain a clear airway will improve Outcome: Not Progressing Note: Heated high flow needed Goal: Levels  of oxygenation will improve Outcome: Not Progressing Goal: Ability to maintain adequate ventilation will improve Outcome: Progressing
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
## 2023-09-27 ENCOUNTER — Inpatient Hospital Stay (HOSPITAL_COMMUNITY): Payer: No Typology Code available for payment source

## 2023-09-27 DIAGNOSIS — S2241XA Multiple fractures of ribs, right side, initial encounter for closed fracture: Secondary | ICD-10-CM | POA: Diagnosis not present

## 2023-09-27 LAB — BLOOD GAS, ARTERIAL
Acid-base deficit: 1 mmol/L (ref 0.0–2.0)
Acid-base deficit: 2 mmol/L (ref 0.0–2.0)
Bicarbonate: 24.3 mmol/L (ref 20.0–28.0)
Bicarbonate: 23.1 mmol/L (ref 20.0–28.0)
Drawn by: 42783
Drawn by: 42783
O2 Saturation: 65.1 %
O2 Saturation: 97.8 %
Patient temperature: 37.4
Patient temperature: 37
pCO2 arterial: 43 mm[Hg] (ref 32–48)
pCO2 arterial: 40 mm[Hg] (ref 32–48)
pH, Arterial: 7.36 (ref 7.35–7.45)
pH, Arterial: 7.37 (ref 7.35–7.45)
pO2, Arterial: 42 mm[Hg] — ABNORMAL LOW (ref 83–108)
pO2, Arterial: 73 mm[Hg] — ABNORMAL LOW (ref 83–108)

## 2023-09-27 LAB — BASIC METABOLIC PANEL
Anion gap: 9 (ref 5–15)
Anion gap: 8 (ref 5–15)
BUN: 34 mg/dL — ABNORMAL HIGH (ref 8–23)
BUN: 31 mg/dL — ABNORMAL HIGH (ref 8–23)
CO2: 23 mmol/L (ref 22–32)
CO2: 24 mmol/L (ref 22–32)
Calcium: 8.5 mg/dL — ABNORMAL LOW (ref 8.9–10.3)
Calcium: 8.8 mg/dL — ABNORMAL LOW (ref 8.9–10.3)
Chloride: 104 mmol/L (ref 98–111)
Chloride: 103 mmol/L (ref 98–111)
Creatinine, Ser: 2.15 mg/dL — ABNORMAL HIGH (ref 0.61–1.24)
Creatinine, Ser: 1.95 mg/dL — ABNORMAL HIGH (ref 0.61–1.24)
GFR, Estimated: 33 mL/min — ABNORMAL LOW (ref >60)
GFR, Estimated: 37 mL/min — ABNORMAL LOW (ref >60)
Glucose, Bld: 119 mg/dL — ABNORMAL HIGH (ref 70–99)
Glucose, Bld: 101 mg/dL — ABNORMAL HIGH (ref 70–99)
Potassium: 4.4 mmol/L (ref 3.5–5.1)
Potassium: 4.1 mmol/L (ref 3.5–5.1)
Sodium: 136 mmol/L (ref 135–145)
Sodium: 135 mmol/L (ref 135–145)

## 2023-09-27 LAB — CBC
HCT: 25 % — ABNORMAL LOW (ref 39.0–52.0)
Hemoglobin: 7.7 g/dL — ABNORMAL LOW (ref 13.0–17.0)
MCH: 19.3 pg — ABNORMAL LOW (ref 26.0–34.0)
MCHC: 30.8 g/dL (ref 30.0–36.0)
MCV: 62.5 fL — ABNORMAL LOW (ref 80.0–100.0)
Platelets: 211 10*3/uL (ref 150–400)
RBC: 4 MIL/uL — ABNORMAL LOW (ref 4.22–5.81)
RDW: 16.1 % — ABNORMAL HIGH (ref 11.5–15.5)
WBC: 8.9 10*3/uL (ref 4.0–10.5)
nRBC: 0 % (ref 0.0–0.2)

## 2023-09-27 LAB — POCT I-STAT 7, (LYTES, BLD GAS, ICA,H+H)
Acid-base deficit: 1 mmol/L (ref 0.0–2.0)
Bicarbonate: 24.1 mmol/L (ref 20.0–28.0)
Calcium, Ion: 1.23 mmol/L (ref 1.15–1.40)
HCT: 30 % — ABNORMAL LOW (ref 39.0–52.0)
Hemoglobin: 10.2 g/dL — ABNORMAL LOW (ref 13.0–17.0)
O2 Saturation: 99 %
Patient temperature: 98.2
Potassium: 3.9 mmol/L (ref 3.5–5.1)
Sodium: 137 mmol/L (ref 135–145)
TCO2: 25 mmol/L (ref 22–32)
pCO2 arterial: 40 mm[Hg] (ref 32–48)
pH, Arterial: 7.386 (ref 7.35–7.45)
pO2, Arterial: 165 mm[Hg] — ABNORMAL HIGH (ref 83–108)

## 2023-09-27 LAB — BRAIN NATRIURETIC PEPTIDE
B Natriuretic Peptide: 380.6 pg/mL — ABNORMAL HIGH (ref 0.0–100.0)
B Natriuretic Peptide: 432.7 pg/mL — ABNORMAL HIGH (ref 0.0–100.0)

## 2023-09-27 MED ORDER — IPRATROPIUM-ALBUTEROL 0.5-2.5 (3) MG/3ML IN SOLN
3.0000 mL | Freq: Once | RESPIRATORY_TRACT | Status: AC
  Administered 2023-09-27: 3 mL via RESPIRATORY_TRACT
  Filled 2023-09-27: qty 3

## 2023-09-27 MED ORDER — METHOCARBAMOL 500 MG PO TABS
1000.0000 mg | ORAL_TABLET | Freq: Three times a day (TID) | ORAL | Status: DC
  Administered 2023-09-27 – 2023-10-04 (×30): 1000 mg via ORAL
  Filled 2023-09-27 (×30): qty 2

## 2023-09-27 MED ORDER — FUROSEMIDE 40 MG PO TABS
40.0000 mg | ORAL_TABLET | Freq: Two times a day (BID) | ORAL | Status: DC
  Administered 2023-09-27 – 2023-09-30 (×8): 40 mg via ORAL
  Filled 2023-09-27 (×8): qty 1

## 2023-09-27 MED ORDER — SPIRONOLACTONE 25 MG PO TABS
25.0000 mg | ORAL_TABLET | Freq: Every day | ORAL | Status: DC
  Administered 2023-09-27 – 2023-10-04 (×8): 25 mg via ORAL
  Filled 2023-09-27 (×8): qty 1

## 2023-09-27 MED ORDER — GUAIFENESIN-DM 100-10 MG/5ML PO SYRP
10.0000 mL | ORAL_SOLUTION | Freq: Four times a day (QID) | ORAL | Status: DC
  Administered 2023-09-27 – 2023-10-04 (×28): 10 mL via ORAL
  Filled 2023-09-27 (×28): qty 10

## 2023-09-27 MED ORDER — OXYCODONE HCL 5 MG PO TABS
5.0000 mg | ORAL_TABLET | Freq: Two times a day (BID) | ORAL | Status: DC | PRN
  Administered 2023-09-27: 5 mg via ORAL
  Filled 2023-09-27: qty 1

## 2023-09-27 MED ORDER — CHLORHEXIDINE GLUCONATE CLOTH 2 % EX PADS
6.0000 | MEDICATED_PAD | Freq: Every day | CUTANEOUS | Status: DC
  Administered 2023-09-27 – 2023-10-04 (×7): 6 via TOPICAL

## 2023-09-27 MED ORDER — SENNOSIDES-DOCUSATE SODIUM 8.6-50 MG PO TABS
1.0000 | ORAL_TABLET | Freq: Two times a day (BID) | ORAL | Status: DC
  Administered 2023-09-27 – 2023-10-04 (×15): 1 via ORAL
  Filled 2023-09-27 (×15): qty 1

## 2023-09-27 MED ORDER — HYDROMORPHONE HCL 1 MG/ML IJ SOLN
0.5000 mg | Freq: Once | INTRAMUSCULAR | Status: AC
  Administered 2023-09-27: 0.5 mg via INTRAVENOUS
  Filled 2023-09-27: qty 0.5

## 2023-09-27 MED ORDER — BISACODYL 10 MG RE SUPP
10.0000 mg | Freq: Once | RECTAL | Status: DC
  Filled 2023-09-27: qty 1

## 2023-09-27 MED ORDER — POLYETHYLENE GLYCOL 3350 17 G PO PACK
17.0000 g | PACK | Freq: Two times a day (BID) | ORAL | Status: DC
  Administered 2023-09-27 – 2023-09-29 (×2): 17 g via ORAL
  Filled 2023-09-27 (×5): qty 1

## 2023-09-27 MED ORDER — FUROSEMIDE 10 MG/ML IJ SOLN
40.0000 mg | Freq: Once | INTRAMUSCULAR | Status: AC
  Administered 2023-09-27: 40 mg via INTRAVENOUS
  Filled 2023-09-27: qty 4

## 2023-09-27 NOTE — Progress Notes (Signed)
RT assisted w/transport of this pt from 3E24 to 4N16 while on BiPAP. Pt tolerated well with SVS. No complications. 4N RT given report.

## 2023-09-27 NOTE — Progress Notes (Signed)
Patient with increased work of breathing, RT increased flow on HHFNC to 45L. Patient stated that he feels better, RT will continue to monitor patient.
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
## 2023-09-27 NOTE — Progress Notes (Signed)
PT Cancellation Note  Patient Details Name: Jeremy Douglas MRN: [id number] DOB: 05/31/1956   Cancelled Treatment:    Reason Eval/Treat Not Completed: (P) Medical issues which prohibited therapy (Pt on 100% O2 and RN deferring therapy today. Will follow up when pt is medically ready.)   Johny Shock 09/27/2023, 2:03 PM

## 2023-09-27 NOTE — Progress Notes (Signed)
Patient presented to IR for thoracentesis evaluation.  Limited ultrasound examination of the right lung revealed scattered pockets of pleural fluid none of which were sufficient for safe percutaneous access. No procedure performed. Image findings discussed with patient. Images saved in Epic.   Alwyn Ren, [place] [phone] 09/27/2023, 3:16 PM
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
## 2023-09-27 NOTE — Progress Notes (Signed)
Pt back from procedure. RT placed pt back on HHFNC 35L 100%.

## 2023-09-27 NOTE — Progress Notes (Cosign Needed Addendum)
Progress Note     Subjective: Patient denies feeling SOB, states his pain is still severe and not improved compared to yesterday. He states that he feels a little better compared to yesterday. Denies nausea or vomiting. Denies BM since admission. States his baseline narcotic use is for knee pain.   Sister in law is at bedside  Objective: Vital signs in last 24 hours: Temp:  [97.6 F (36.4 C)-99.9 F (37.7 C)] 99.3 F (37.4 C) (10/04 0745) Pulse Rate:  [61-81] 73 (10/04 0750) Resp:  [18-28] 26 (10/04 0745) BP: (122-182)/(52-71) 154/55 (10/04 0745) SpO2:  [90 %-99 %] 92 % (10/04 0750) FiO2 (%):  [55 %-100 %] 78 % (10/04 0741) Weight:  [85.5 kg] 85.5 kg (10/04 0425) Last BM Date : 09/24/23  Intake/Output from previous day: 10/03 0701 - 10/04 0700 In: 340 [P.O.:340] Out: 1025 [Urine:1025] Intake/Output this shift: Total I/O In: 120 [P.O.:120] Out: -   PE: General: pleasant, WD, laying in bed. Appears comfortable but with mild tachypnea 22-25 bpm HEENT: head is normocephalic, atraumatic.  Heart: regular, rate, and rhythm.  Lungs: distant breath sounds with rhonchi, .Sats 91-94% on HFNC s (Currently 30L, FiO2 78%)  Abd: soft, NT, ND, +BS MSK: patient has compression stockings in place. Skin: warm and dry with no masses, lesions, or rashes Psych: A&Ox3 with an appropriate affect.    Lab Results:  Recent Labs    09/26/23 0304 09/27/23 0347  WBC 8.7 8.9  HGB 8.2* 7.7*  HCT 27.1* 25.0*  PLT 209 211   BMET Recent Labs    09/26/23 0304 09/27/23 0347  NA 137 136  K 4.3 4.4  CL 106 104  CO2 21* 23  GLUCOSE 102* 119*  BUN 39* 34*  CREATININE 2.31* 2.15*  CALCIUM 8.7* 8.5*   PT/INR No results for input(s): "LABPROT", "INR" in the last 72 hours. CMP     Component Value Date/Time   NA 136 09/27/2023 0347   K 4.4 09/27/2023 0347   CL 104 09/27/2023 0347   CO2 23 09/27/2023 0347   GLUCOSE 119 (H) 09/27/2023 0347   BUN 34 (H) 09/27/2023 0347   CREATININE  2.15 (H) 09/27/2023 0347   CALCIUM 8.5 (L) 09/27/2023 0347   PROT 6.7 09/24/2023 2348   ALBUMIN 3.1 (L) 09/24/2023 2348   AST 24 09/24/2023 2348   ALT 15 09/24/2023 2348   ALKPHOS 87 09/24/2023 2348   BILITOT 0.4 09/24/2023 2348   GFRNONAA 33 (L) 09/27/2023 0347   GFRAA 53 (L) 12/06/2018 0620   Lipase     Component Value Date/Time   LIPASE 76 (H) 02/09/2011 0550       Studies/Results: DG CHEST PORT 1 VIEW  Result Date: 09/26/2023 CLINICAL DATA:  Follow-up right-sided pneumothorax EXAM: PORTABLE CHEST 1 VIEW COMPARISON:  09/25/2023 FINDINGS: Cardiac shadow is stable. Aortic calcifications are noted. Previously seen right apical pneumothorax has resolved. Bibasilar atelectatic changes are again seen. No new focal abnormality is noted. IMPRESSION: Resolution of previously seen right-sided pneumothorax. Electronically Signed   By: Alcide Clever M.D.   On: 09/26/2023 01:50    Anti-infectives: Anti-infectives (From admission, onward)    None        Assessment/Plan MCC  Right 3-10th rib fractures with small pneumothorax/acute hypoxic respiratory failure - Repeat CXR this am w no PTX, ?increase in R pleural effusion (pending final read could consider thora). cont continuous pulse ox, incentive spirometer, O2 via hi-flow.  ABG pending.  Pending these results, continue to closely monitor  on progressive level of care. Anemia - no acute blood loss to explain on imaging, likely anemia of chronic disease. Hgb down to 8.2 Pain control - on oxycodone 10 mg 5 times daily at baseline. Increase oxycodone prn scale to 10-15mg . Continue multimodal pain control AKI on CKD stage III - creatinine a little better today (2.15) IV saline locked. Avoiding nephrotoxic agents. Baseline appears around 1.4 HTN - home meds ordered. TRH consulting LE edema, h/o HFpEF - monitor - BNP 380 from 685. May need diuresis.  H/o COPD - on 5 lpm supp O2 prn at home Tobacco use - nicotine patch, nicotine gum H/o  alcohol use disorder - has experienced withdrawal in the past. CIWA H/o PE - no longer on anticoagulation  Appreciate TRH assistance with multiple medical comorbidities and discussed his situation with them at bedside, nursing, and RT for coordination of care.  Also discussed need for ventilator and if he would want intubation pending the need.  He expressed the desire to want to move forward with this if needed.  FEN: renal diet, SLIV ID: none VTE: hep subq Dispo: progressive, ABG pending, on Hi-flow O2 Change oxy to 15 mg q 4h PRN, additional 5 mg oxy BID PRN for breakthrough, increase robaxin to 1,000 mg QID. Pain control will be difficult - he tells me he takes 20 mg oxy in the AM and then 10 mg 2-3 more times daily. This is for knee pain.  I reviewed hospitalist notes, last 24 h vitals and pain scores, last 48 h intake and output, last 24 h labs and trends, and last 24 h imaging results.     LOS: 3 days   Adam Phenix, [facility] Surgery 09/27/2023, 7:58 AM Please see Amion for pager number during day hours 7:00am-4:30pm

## 2023-09-27 NOTE — Progress Notes (Signed)
Pt gone to IR for procedure. Pt removed from HHFNC and placed on 15L NRB. Pt tolerated well. Pt will be placed back on HF once back in room.

## 2023-09-27 NOTE — Progress Notes (Signed)
Seen and examined. Breathing comfortably on bipap and subjectively feels like breathing is less labored. ABG reviewed. Will monitor on bipap, f/u on BNP. Lasix ordered, CXR reviewed. Continue to monitor. Discussed the possibility of intubation and patient is agreeable to this.   Critical care time:  Diamantina Monks, MD General and Trauma Surgery [facility]

## 2023-09-27 NOTE — Progress Notes (Signed)
Notified by staff that patient had increased WOB after returning from IR. Seen with RN and resp.  Patient with hx of Right 3-10th rib fractures with small pneumothorax on admission.   IR note reviewed - "Right lung revealed scattered pockets of pleural fluid none of which were sufficient for safe percutaneous access. No procedure performed."  Patient with hx of COPD on on 5 lpm supp O2 prn at home and HFpEF on 80mg  of lasix in AM and 40mg  in PM.   Currently on NRB at 100% with o2 sat at 97% Complains of R rib pain. Reports some sob. Productive cough.   Blood pressure (!) 170/71, pulse 73, temperature 99.1 F (37.3 C), temperature source Oral, resp. rate (!) 26, height 5\' 11"  (1.803 m), weight 85.5 kg, SpO2 91%.  On exam patient has tachypnea, accessory muscle use, decreased breath sounds on the R compared to the L. No obvious rhonchi, wheezing or rales. He does have trace non-pitting edema of the LE's. No UE edema. UOP over the last 24 hours 0.58ml/kg/hr.   Reviewing today's labs/imaging.  His xray this am showed no ptx, increased moderate right pleural effusion and a small left pleural effusion with bibasilar opacities. WBC wnl. BNP 380 from 685. It appears lasix has been held here for AKI on CKD (Cr improving to 2.15 this am).  PaO2 this am 42  Plan - Will initiate BiPAP and transfer to the ICU. Confirmed with patient he would like intubation if he was to require it.  - Repeat ABG, PaO2 this am was 42 - Repeat CXR - Repeat BNP. Discussed with attending. Will give lasix now. Monitor Cr.  - Resp cx  Plan was discussed with RN, resp, rapid response. TRN also updated.   After initiating BiPAP, patient appeared more comfortable. He has been transferred to 4N. Discussed case with my attending who will continue to monitor.   Jacinto Halim, PA-C

## 2023-09-28 DIAGNOSIS — S2241XA Multiple fractures of ribs, right side, initial encounter for closed fracture: Secondary | ICD-10-CM | POA: Diagnosis not present

## 2023-09-28 LAB — POCT I-STAT 7, (LYTES, BLD GAS, ICA,H+H)
Acid-Base Excess: 0 mmol/L (ref 0.0–2.0)
Bicarbonate: 24.7 mmol/L (ref 20.0–28.0)
Calcium, Ion: 1.23 mmol/L (ref 1.15–1.40)
HCT: 26 % — ABNORMAL LOW (ref 39.0–52.0)
Hemoglobin: 8.8 g/dL — ABNORMAL LOW (ref 13.0–17.0)
O2 Saturation: 97 %
Patient temperature: 97.7
Potassium: 3.9 mmol/L (ref 3.5–5.1)
Sodium: 137 mmol/L (ref 135–145)
TCO2: 26 mmol/L (ref 22–32)
pCO2 arterial: 40.2 mm[Hg] (ref 32–48)
pH, Arterial: 7.394 (ref 7.35–7.45)
pO2, Arterial: 90 mm[Hg] (ref 83–108)

## 2023-09-28 LAB — CBC
HCT: 26.8 % — ABNORMAL LOW (ref 39.0–52.0)
Hemoglobin: 8.3 g/dL — ABNORMAL LOW (ref 13.0–17.0)
MCH: 19.6 pg — ABNORMAL LOW (ref 26.0–34.0)
MCHC: 31 g/dL (ref 30.0–36.0)
MCV: 63.2 fL — ABNORMAL LOW (ref 80.0–100.0)
Platelets: 216 10*3/uL (ref 150–400)
RBC: 4.24 MIL/uL (ref 4.22–5.81)
RDW: 16.2 % — ABNORMAL HIGH (ref 11.5–15.5)
WBC: 6.9 10*3/uL (ref 4.0–10.5)
nRBC: 0 % (ref 0.0–0.2)

## 2023-09-28 LAB — BASIC METABOLIC PANEL
Anion gap: 8 (ref 5–15)
BUN: 34 mg/dL — ABNORMAL HIGH (ref 8–23)
CO2: 27 mmol/L (ref 22–32)
Calcium: 9 mg/dL (ref 8.9–10.3)
Chloride: 101 mmol/L (ref 98–111)
Creatinine, Ser: 2.15 mg/dL — ABNORMAL HIGH (ref 0.61–1.24)
GFR, Estimated: 33 mL/min — ABNORMAL LOW (ref >60)
Glucose, Bld: 132 mg/dL — ABNORMAL HIGH (ref 70–99)
Potassium: 4.2 mmol/L (ref 3.5–5.1)
Sodium: 136 mmol/L (ref 135–145)

## 2023-09-28 LAB — PROCALCITONIN: Procalcitonin: <0.1 ng/mL

## 2023-09-28 NOTE — Progress Notes (Signed)
Progress Note     Subjective: Patient denies feeling SOB, stable to improve chest wall pain. Breathing more comfortably today on [place], bipap weaned. Denies nausea or vomiting.   No family present at bedside.  Objective: Vital signs in last 24 hours: Temp:  [97.9 F (36.6 C)-99.1 F (37.3 C)] 98.1 F (36.7 C) (10/05 0800) Pulse Rate:  [56-78] 64 (10/05 0815) Resp:  [13-28] 18 (10/05 0815) BP: (122-182)/(59-80) 172/69 (10/05 0600) SpO2:  [88 %-100 %] 92 % (10/05 0815) FiO2 (%):  [50 %-100 %] 50 % (10/05 0318) Weight:  [92 kg] 92 kg (10/05 0600) Last BM Date : 09/24/23  Intake/Output from previous day: 10/04 0701 - 10/05 0700 In: 240 [P.O.:240] Out: 2550 [Urine:2550] Intake/Output this shift: No intake/output data recorded.  PE: General: pleasant, WD, laying in bed. Normal work of breathing on [place] Heart:RRR Lungs: Sats 97% on [place] (currently 5-6 L) Abd: soft, NT, ND, +BS MSK: patient has compression stockings in place. Skin: warm and dry Psych:appropriate affect.    Lab Results:  Recent Labs    09/27/23 0347 09/27/23 2111 09/28/23 0501 09/28/23 0912  WBC 8.9  --   --  6.9  HGB 7.7*   < > 8.8* 8.3*  HCT 25.0*   < > 26.0* 26.8*  PLT 211  --   --  216   < > = values in this interval not displayed.   BMET Recent Labs    09/27/23 1603 09/27/23 2111 09/28/23 0501 09/28/23 0912  NA 135   < > 137 136  K 4.1   < > 3.9 4.2  CL 103  --   --  101  CO2 24  --   --  27  GLUCOSE 101*  --   --  132*  BUN 31*  --   --  34*  CREATININE 1.95*  --   --  2.15*  CALCIUM 8.8*  --   --  9.0   < > = values in this interval not displayed.   PT/INR No results for input(s): "LABPROT", "INR" in the last 72 hours. CMP     Component Value Date/Time   NA 136 09/28/2023 0912   K 4.2 09/28/2023 0912   CL 101 09/28/2023 0912   CO2 27 09/28/2023 0912   GLUCOSE 132 (H) 09/28/2023 0912   BUN 34 (H) 09/28/2023 0912   CREATININE 2.15 (H) 09/28/2023 0912   CALCIUM 9.0 09/28/2023  0912   PROT 6.7 09/24/2023 2348   ALBUMIN 3.1 (L) 09/24/2023 2348   AST 24 09/24/2023 2348   ALT 15 09/24/2023 2348   ALKPHOS 87 09/24/2023 2348   BILITOT 0.4 09/24/2023 2348   GFRNONAA 33 (L) 09/28/2023 0912   GFRAA 53 (L) 12/06/2018 0620   Lipase     Component Value Date/Time   LIPASE 76 (H) 02/09/2011 0550       Studies/Results: DG CHEST PORT 1 VIEW  Result Date: 09/27/2023 CLINICAL DATA:  Rib fracture on the right. EXAM: PORTABLE CHEST 1 VIEW COMPARISON:  X-ray earlier 09/27/2023 FINDINGS: Underinflation. Film is rotated. Persistent small effusions and lung base opacities. Persistent diffuse interstitial changes as well. No definite pneumothorax. Stable cardiopericardial silhouette when adjusting for technique. Overlapping cardiac leads. IMPRESSION: No significant interval change when adjusting for technique Electronically Signed   By: Karen Kays M.D.   On: 09/27/2023 18:45   IR US CHEST  Result Date: 09/27/2023 CLINICAL DATA:  Trauma patient with multiple broken ribs and right pleural effusion. Therapeutic  and diagnostic thoracentesis requested. EXAM: CHEST ULTRASOUND COMPARISON:  DG chest 09/27/2023. FINDINGS: Limited ultrasound examination of the right lung revealed scattered pockets of pleural fluid none of which were sufficient for safe percutaneous access. No procedure performed. Image findings discussed with patient. Ultrasound images obtained by Alwyn Ren NP IMPRESSION: Scattered pockets of right pleural fluid.  Thoracentesis deferred. Electronically Signed   By: Corlis Leak M.D.   On: 09/27/2023 16:58   DG CHEST PORT 1 VIEW  Result Date: 09/27/2023 CLINICAL DATA:  Chest pain shortness of breath EXAM: PORTABLE CHEST 1 VIEW COMPARISON:  Chest radiograph dated 09/26/2023 FINDINGS: Low lung volumes with bronchovascular crowding. Similar bilateral reticulations and dense bibasilar opacities. Increased moderate right pleural effusion. Similar small left pleural effusions.  No definite pneumothorax. Similar cardiomediastinal silhouette. Similar displaced right lateral rib fractures. IMPRESSION: 1. Increased moderate right pleural effusion. Similar small left pleural effusion. 2. Similar bilateral reticulations and dense bibasilar opacities, which may represent atelectasis on a background of fibrosis. 3. Similar displaced right lateral rib fractures. Electronically Signed   By: Agustin Cree M.D.   On: 09/27/2023 08:30    Anti-infectives: Anti-infectives (From admission, onward)    None        Assessment/Plan MCC  Right 3-10th rib fractures with small pneumothorax/acute hypoxic respiratory failure - Repeat CXR 10/4 w no PTX, and stable appearing small effusion. cont continuous pulse ox, incentive spirometer, O2 support. ICU for monitoring today  Anemia - no acute blood loss to explain on imaging, likely anemia of chronic disease. Hgb down to 8.2 Pain control - on oxycodone 10 mg 5 times daily at baseline. Increase oxycodone prn scale to 10-15mg . Continue multimodal pain control AKI on CKD stage III - creatinine a little better today (2.15) IV saline locked. Avoiding nephrotoxic agents. Baseline appears around 1.4 HTN - home meds ordered. TRH consulting LE edema, h/o HFpEF - monitor - BNP 380 from 685. May need diuresis.  H/o COPD - on 5 lpm supp O2 prn at home Tobacco use - nicotine patch, nicotine gum H/o alcohol use disorder - has experienced withdrawal in the past. CIWA H/o PE - no longer on anticoagulation  Appreciate TRH assistance with multiple medical comorbidities and discussed his situation with them at bedside, nursing, and RT for coordination of care.  Also discussed need for ventilator and if he would want intubation pending the need.  He expressed the desire to want to move forward with this if needed.  FEN: renal diet, SLIV ID: none VTE: hep subq Dispo: progressive, ABG pending, on Hi-flow O2 Change oxy to 15 mg q 4h PRN, additional 5 mg oxy BID  PRN for breakthrough, increase robaxin to 1,000 mg QID. Pain control will be difficult - he tells me he takes 20 mg oxy in the AM and then 10 mg 2-3 more times daily. This is for knee pain.  I reviewed hospitalist notes, last 24 h vitals and pain scores, last 48 h intake and output, last 24 h labs and trends, and last 24 h imaging results.  CRITICAL CARE Performed by: Andria Meuse   Total critical care time: 35 minutes  Critical care time was exclusive of separately billable procedures and treating other patients.  Critical care was necessary to treat or prevent imminent or life-threatening deterioration.  Critical care was time spent personally by me on the following activities: development of treatment plan with patient and/or surrogate as well as nursing, discussions with consultants, evaluation of patient's response to treatment, examination  of patient, obtaining history from patient or surrogate, ordering and performing treatments and interventions, ordering and review of laboratory studies, ordering and review of radiographic studies, pulse oximetry and re-evaluation of patient's condition.    LOS: 4 days   Check [email or web address] for General Surgery coverage night/weekend/holidays  Page if acute issues. No secure chat available for me given surgeries/clinic/off post call which would lead to a delay in care.  Marin Olp, MD [facility] Surgery, A [facility] Practice
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
## 2023-09-29 DIAGNOSIS — S2241XA Multiple fractures of ribs, right side, initial encounter for closed fracture: Secondary | ICD-10-CM | POA: Diagnosis not present

## 2023-09-29 LAB — CBC
HCT: 29.1 % — ABNORMAL LOW (ref 39.0–52.0)
Hemoglobin: 9.1 g/dL — ABNORMAL LOW (ref 13.0–17.0)
MCH: 19.7 pg — ABNORMAL LOW (ref 26.0–34.0)
MCHC: 31.3 g/dL (ref 30.0–36.0)
MCV: 63 fL — ABNORMAL LOW (ref 80.0–100.0)
Platelets: 258 10*3/uL (ref 150–400)
RBC: 4.62 MIL/uL (ref 4.22–5.81)
RDW: 16.1 % — ABNORMAL HIGH (ref 11.5–15.5)
WBC: 10.5 10*3/uL (ref 4.0–10.5)
nRBC: 0 % (ref 0.0–0.2)

## 2023-09-29 LAB — BASIC METABOLIC PANEL
Anion gap: 10 (ref 5–15)
BUN: 31 mg/dL — ABNORMAL HIGH (ref 8–23)
CO2: 28 mmol/L (ref 22–32)
Calcium: 9.4 mg/dL (ref 8.9–10.3)
Chloride: 97 mmol/L — ABNORMAL LOW (ref 98–111)
Creatinine, Ser: 1.99 mg/dL — ABNORMAL HIGH (ref 0.61–1.24)
GFR, Estimated: 36 mL/min — ABNORMAL LOW (ref >60)
Glucose, Bld: 115 mg/dL — ABNORMAL HIGH (ref 70–99)
Potassium: 3.8 mmol/L (ref 3.5–5.1)
Sodium: 135 mmol/L (ref 135–145)

## 2023-09-29 MED ORDER — HYDROMORPHONE HCL 1 MG/ML IJ SOLN
1.0000 mg | INTRAMUSCULAR | Status: DC | PRN
  Administered 2023-09-29 – 2023-10-03 (×27): 1 mg via INTRAVENOUS
  Filled 2023-09-29 (×29): qty 1

## 2023-09-29 MED ORDER — HYDROMORPHONE HCL 1 MG/ML IJ SOLN
0.5000 mg | INTRAMUSCULAR | Status: DC | PRN
Start: 2023-09-29 — End: 2023-09-29

## 2023-09-29 MED ORDER — LIDOCAINE 5 % EX PTCH
1.0000 | MEDICATED_PATCH | CUTANEOUS | Status: DC
  Administered 2023-09-29 – 2023-10-04 (×6): 1 via TRANSDERMAL
  Filled 2023-09-29 (×6): qty 1
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
## 2023-09-29 NOTE — Progress Notes (Signed)
Pt placed on BIPAP to rest for the evening. Pt tolerating well at this time.   09/29/23 2334  BiPAP/CPAP/SIPAP  BiPAP/CPAP/SIPAP Pt Type Adult  BiPAP/CPAP/SIPAP V60  Mask Type Full face mask  Mask Size Large  Set Rate 10 breaths/min  Respiratory Rate 24 breaths/min  IPAP 14 cmH20  EPAP 8 cmH2O  FiO2 (%) 60 %  Flow Rate 0 lpm  Minute Ventilation 11.2  Leak 11  Peak Inspiratory Pressure (PIP) 15  Tidal Volume (Vt) 609  Patient Home Equipment No  Press High Alarm 25 cmH2O  Press Low Alarm 5 cmH2O  Oxygen Percent 60 %  BiPAP/CPAP /SiPAP Vitals  Pulse Rate 83  Resp (!) 21  SpO2 (!) 84 %  MEWS Score/Color  MEWS Score 1  MEWS Score Color [place] Si
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
## 2023-09-30 ENCOUNTER — Inpatient Hospital Stay (HOSPITAL_COMMUNITY): Payer: No Typology Code available for payment source

## 2023-09-30 DIAGNOSIS — S2241XA Multiple fractures of ribs, right side, initial encounter for closed fracture: Secondary | ICD-10-CM | POA: Diagnosis not present

## 2023-09-30 MED ORDER — DULOXETINE HCL 60 MG PO CPEP
60.0000 mg | ORAL_CAPSULE | Freq: Every day | ORAL | Status: DC
  Administered 2023-09-30 – 2023-10-04 (×5): 60 mg via ORAL
  Filled 2023-09-30 (×5): qty 1

## 2023-09-30 MED ORDER — OXYCODONE HCL 5 MG PO TABS
15.0000 mg | ORAL_TABLET | ORAL | Status: DC | PRN
  Administered 2023-09-30 – 2023-10-03 (×18): 20 mg via ORAL
  Administered 2023-10-04: 15 mg via ORAL
  Administered 2023-10-04 (×2): 20 mg via ORAL
  Filled 2023-09-30 (×12): qty 4
  Filled 2023-09-30: qty 3
  Filled 2023-09-30 (×8): qty 4

## 2023-09-30 MED ORDER — ALBUTEROL SULFATE (2.5 MG/3ML) 0.083% IN NEBU: 3.0000 mL | INHALATION_SOLUTION | Freq: Four times a day (QID) | RESPIRATORY_TRACT | Status: DC | PRN
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
## 2023-09-30 NOTE — Procedures (Signed)
Chest tube insertion  Date/Time: 09/30/2023 1:14 PM  Performed by: Fritzi Mandes, MD Authorized by: Fritzi Mandes, MD   Consent:    Consent obtained:  Verbal and written   Consent given by:  Patient Universal protocol:    Procedure explained and questions answered to patient or proxy's satisfaction: yes     Relevant documents present and verified: yes     Imaging studies available: yes   Pre-procedure details:    Skin preparation:  ChloraPrep   Preparation: Patient was prepped and draped in the usual sterile fashion   Anesthesia (see MAR for exact dosages):    Anesthesia method:  Local infiltration   Local anesthetic:  Lidocaine 1% w/o epi Procedure details:    Placement location:  R lateral   Scalpel size:  11   Tube size (Fr):  8   Technique: Seldinger     Ultrasound guidance: no     Tension pneumothorax: no     Tube connected to:  Suction   Drainage characteristics:  Air only   Suture material:  0 silk   Dressing:  4x4 sterile gauze  Sophronia Simas, MD [facility] General, Hepatobiliary and Pancreatic Surgery 09/30/23 1:15 PM
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
## 2023-10-01 ENCOUNTER — Inpatient Hospital Stay (HOSPITAL_COMMUNITY): Payer: No Typology Code available for payment source

## 2023-10-01 DIAGNOSIS — S2241XA Multiple fractures of ribs, right side, initial encounter for closed fracture: Secondary | ICD-10-CM | POA: Diagnosis not present

## 2023-10-01 LAB — BASIC METABOLIC PANEL
Anion gap: 13 (ref 5–15)
BUN: 46 mg/dL — ABNORMAL HIGH (ref 8–23)
CO2: 26 mmol/L (ref 22–32)
Calcium: 9.1 mg/dL (ref 8.9–10.3)
Chloride: 95 mmol/L — ABNORMAL LOW (ref 98–111)
Creatinine, Ser: 2.65 mg/dL — ABNORMAL HIGH (ref 0.61–1.24)
GFR, Estimated: 26 mL/min — ABNORMAL LOW (ref >60)
Glucose, Bld: 130 mg/dL — ABNORMAL HIGH (ref 70–99)
Potassium: 4.4 mmol/L (ref 3.5–5.1)
Sodium: 134 mmol/L — ABNORMAL LOW (ref 135–145)

## 2023-10-01 LAB — MAGNESIUM: Magnesium: 2.5 mg/dL — ABNORMAL HIGH (ref 1.7–2.4)

## 2023-10-01 MED ORDER — IPRATROPIUM-ALBUTEROL 0.5-2.5 (3) MG/3ML IN SOLN
3.0000 mL | Freq: Three times a day (TID) | RESPIRATORY_TRACT | Status: DC
  Administered 2023-10-01 – 2023-10-02 (×3): 3 mL via RESPIRATORY_TRACT
  Filled 2023-10-01 (×3): qty 3

## 2023-10-01 MED ORDER — PREDNISONE 20 MG PO TABS
40.0000 mg | ORAL_TABLET | Freq: Every day | ORAL | Status: DC
  Administered 2023-10-01 – 2023-10-04 (×4): 40 mg via ORAL
  Filled 2023-10-01 (×4): qty 2

## 2023-10-01 NOTE — Progress Notes (Signed)
Progress Note     Subjective: Denies dyspnea this morning, states breathing feels much better after chest tube placement.  Objective: Vital signs in last 24 hours: Temp:  [97.6 F (36.4 C)-98.2 F (36.8 C)] 98.2 F (36.8 C) (10/08 0700) Pulse Rate:  [59-80] 66 (10/08 0840) Resp:  [14-22] 20 (10/08 0840) BP: (105-150)/(49-74) 134/61 (10/08 0700) SpO2:  [82 %-96 %] 92 % (10/08 0840) FiO2 (%):  [40 %-50 %] 40 % (10/08 0100) Weight:  [86.8 kg] 86.8 kg (10/08 0500) Last BM Date : 09/29/23  Intake/Output from previous day: 10/07 0701 - 10/08 0700 In: 640 [P.O.:640] Out: 1910 [Urine:1600; Chest Tube:310] Intake/Output this shift: No intake/output data recorded.  PE: General: pleasant, WD, laying in bed. Normal work of breathing on [place] [facility]:RRR Lungs: Sats 90-92% on BiPAP. CT without airleak, 310cc of SS output Abd: soft, nondistended MSK: warm and well-perfused, no deformities Skin: warm and dry Psych:appropriate affect.    Lab Results:  Personally reviewed last 24h of labs   Studies/Results: DG CHEST PORT 1 VIEW  Result Date: 09/30/2023 CLINICAL DATA:  Chest tube placement EXAM: PORTABLE CHEST 1 VIEW COMPARISON:  09/30/2023, 1:56 p.m. FINDINGS: No significant change in AP portable chest radiograph. Pigtail chest tube is positioned about the right lung base, formed pigtail medially in the right hemithorax. No significant residual pneumothorax. Small right pleural effusion. Cardiomegaly. Diffuse bilateral interstitial pulmonary opacity. Osseous structures unremarkable. IMPRESSION: 1. Pigtail chest tube is positioned about the right lung base, formed pigtail medially in the right hemithorax. No significant residual pneumothorax. 2. Small right pleural effusion. 3. Cardiomegaly and diffuse bilateral interstitial pulmonary opacity, consistent with edema. Electronically Signed   By: Jearld Lesch M.D.   On: 09/30/2023 17:57   DG CHEST PORT 1 VIEW  Result Date:  09/30/2023 CLINICAL DATA:  Shortness of breath and chest pain. Chest tube in place. EXAM: PORTABLE CHEST 1 VIEW COMPARISON:  Radiographs 09/30/2023 and 09/27/2023.  CT 09/24/2023. FINDINGS: 1356 hours. Interval placement of a small caliber pigtail catheter into the right pleural space, tip overlying the lower thoracic spine. The right-sided pneumothorax has been evacuated. There is a small right pleural effusion with stable patchy opacities at both lung bases. The heart size and mediastinal contours are stable with aortic atherosclerosis. Known right-sided rib fractures appear unchanged. IMPRESSION: Interval evacuation of right-sided pneumothorax following chest tube placement. Persistent small right pleural effusion and bibasilar airspace opacities. Electronically Signed   By: Carey Bullocks M.D.   On: 09/30/2023 15:25   DG CHEST PORT 1 VIEW  Addendum Date: 09/30/2023   ADDENDUM REPORT: 09/30/2023 12:45 ADDENDUM: Critical Value/emergent results were called by telephone at the time of interpretation on 09/30/2023 at 12:33 pm to patient's nurse Brie who will relay the results to the clinical team. Electronically Signed   By: Carey Bullocks M.D.   On: 09/30/2023 12:45   Result Date: 09/30/2023 CLINICAL DATA:  Hypoxia.  Rib fractures. EXAM: PORTABLE CHEST 1 VIEW COMPARISON:  Radiographs 09/27/2023 and 09/26/2023.  CT 09/24/2023. FINDINGS: 0835 hours. The previously demonstrated small right-sided pneumothorax on CT has significantly enlarged from the prior radiographs of 3 days ago, estimated at approximately 40%. No mediastinal shift is demonstrated. Atelectasis is present at both lung bases. There may be a small amount of pleural fluid bilaterally. The heart size and mediastinal contours are stable with aortic atherosclerosis. Right-sided rib fractures are noted IMPRESSION: 1. Interval enlargement of right-sided pneumothorax, estimated at approximately 40%. No mediastinal shift. 2. Bibasilar atelectasis and  possible small bilateral pleural effusions. Electronically Signed: By: Carey Bullocks M.D. On: 09/30/2023 12:28     Assessment/Plan [facility]  Right 3-10th rib fractures with small pneumothorax/acute hypoxic respiratory failure due to volume overload. Worsening right pneumothorax 10/7 requiring chest tube. - CXR stable this AM.  --Holding lasix today given creatinine bump concerning for recurrent AKI --Continue CT to suction, 310 output last 24h Anemia - no acute blood loss to explain on imaging, likely anemia of chronic disease. Hgb stable at 9.1 on 10/6. Will repeat labs tomorrow AM. Pain control - on oxycodone  mg 5 times daily at baseline. Oxycodone to 15-20mg  prn. Scheduled tylenol and robaxin, prn dilaudid. AKI on CKD stage III - creatinine bump this AM to 2.65 from 1.99, baseline ~1.4. Avoid NSAIDs, home losartan on hold. Holding lasix HTN - home meds ordered. TR[facility] following, appreciate recs. LE edema, h/o HFpEF - holding lasix for today, will reassess restarting tomorrow if creatinine improves H/o COPD - On 5-6L nasal cannula at baseline. Wean oxygen for goal SpO2 88-90%. --Duonebs and prednisone 40 daily started today per TRH, appreciate recs Tobacco use - nicotine patch, nicotine gum H/o alcohol use disorder - has experienced withdrawal in the past. On CIWA. H/o PE - no longer on anticoagulation FEN: renal diet, SLIV ID: none VTE: SQH, SCDs Dispo: 4NP  I reviewed hospitalist notes, last 24 h vitals and pain scores, last 48 h intake and output, last 24 h labs and trends, and last 24 h imaging results.   Donata Duff, MD [facility]

## 2023-10-01 NOTE — Progress Notes (Signed)
   10/01/23 2147  BiPAP/CPAP/SIPAP  BiPAP/CPAP/SIPAP Pt Type Adult  BiPAP/CPAP/SIPAP V60  Mask Type Full face mask  Mask Size Large  Set Rate 10 breaths/min  Respiratory Rate 20 breaths/min  IPAP 14 cmH20  EPAP 8 cmH2O  FiO2 (%) 40 %  Flow Rate 0 lpm  Minute Ventilation 11.8  Leak 15  Tidal Volume (Vt) 529  Patient Home Equipment No  Auto Titrate No  Press High Alarm 25 cmH2O  Press Low Alarm 5 cmH2O  BiPAP/CPAP /SiPAP Vitals  Pulse Rate 79  Resp 17  SpO2 92 %  MEWS Score/Color  MEWS Score 0  MEWS Score Color Jeremy Douglas
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
## 2023-10-01 NOTE — Progress Notes (Signed)
Bipap removed and patient placed on 6l [place].  Patient tolerated well.  No complications noted.

## 2023-10-01 NOTE — Plan of Care (Signed)
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
## 2023-10-01 NOTE — Progress Notes (Signed)
   10/01/23 0005  BiPAP/CPAP/SIPAP  $ Non-Invasive Ventilator  Non-Invasive Vent Subsequent  BiPAP/CPAP/SIPAP Pt Type Adult  BiPAP/CPAP/SIPAP V60  Mask Type Full face mask  Mask Size Large  Set Rate 10 breaths/min  Respiratory Rate 24 breaths/min  IPAP 14 cmH20  EPAP 8 cmH2O  FiO2 (%) 50 %  Minute Ventilation 14.1  Leak 14  Peak Inspiratory Pressure (PIP) 0  Tidal Volume (Vt) 544  Patient Home Equipment No  Auto Titrate No  Press High Alarm 25 cmH2O  Press Low Alarm 5 cmH2O  Oxygen Percent 50 %  BiPAP/CPAP /SiPAP Vitals  SpO2 95 %  Bilateral Breath Sounds Clear;Diminished

## 2023-10-02 ENCOUNTER — Inpatient Hospital Stay (HOSPITAL_COMMUNITY): Payer: No Typology Code available for payment source

## 2023-10-02 DIAGNOSIS — S270XXA Traumatic pneumothorax, initial encounter: Principal | ICD-10-CM

## 2023-10-02 DIAGNOSIS — S2241XA Multiple fractures of ribs, right side, initial encounter for closed fracture: Secondary | ICD-10-CM | POA: Diagnosis not present

## 2023-10-02 LAB — CBC
HCT: 27.5 % — ABNORMAL LOW (ref 39.0–52.0)
Hemoglobin: 8.4 g/dL — ABNORMAL LOW (ref 13.0–17.0)
MCH: 19 pg — ABNORMAL LOW (ref 26.0–34.0)
MCHC: 30.5 g/dL (ref 30.0–36.0)
MCV: 62.1 fL — ABNORMAL LOW (ref 80.0–100.0)
Platelets: 299 10*3/uL (ref 150–400)
RBC: 4.43 MIL/uL (ref 4.22–5.81)
RDW: 15.7 % — ABNORMAL HIGH (ref 11.5–15.5)
WBC: 8.5 10*3/uL (ref 4.0–10.5)
nRBC: 0 % (ref 0.0–0.2)

## 2023-10-02 LAB — BASIC METABOLIC PANEL
Anion gap: 13 (ref 5–15)
BUN: 53 mg/dL — ABNORMAL HIGH (ref 8–23)
CO2: 26 mmol/L (ref 22–32)
Calcium: 9.3 mg/dL (ref 8.9–10.3)
Chloride: 97 mmol/L — ABNORMAL LOW (ref 98–111)
Creatinine, Ser: 2.32 mg/dL — ABNORMAL HIGH (ref 0.61–1.24)
GFR, Estimated: 30 mL/min — ABNORMAL LOW (ref >60)
Glucose, Bld: 169 mg/dL — ABNORMAL HIGH (ref 70–99)
Potassium: 4.6 mmol/L (ref 3.5–5.1)
Sodium: 136 mmol/L (ref 135–145)

## 2023-10-02 LAB — BRAIN NATRIURETIC PEPTIDE: B Natriuretic Peptide: 225.3 pg/mL — ABNORMAL HIGH (ref 0.0–100.0)

## 2023-10-02 MED ORDER — IPRATROPIUM-ALBUTEROL 0.5-2.5 (3) MG/3ML IN SOLN
3.0000 mL | Freq: Two times a day (BID) | RESPIRATORY_TRACT | Status: DC
  Administered 2023-10-02 – 2023-10-03 (×3): 3 mL via RESPIRATORY_TRACT
  Filled 2023-10-02 (×3): qty 3

## 2023-10-02 MED ORDER — ALUM & MAG HYDROXIDE-SIMETH 200-200-20 MG/5ML PO SUSP
15.0000 mL | Freq: Four times a day (QID) | ORAL | Status: DC | PRN
  Administered 2023-10-02: 15 mL via ORAL
  Filled 2023-10-02: qty 30

## 2023-10-02 MED ORDER — FUROSEMIDE 40 MG PO TABS
40.0000 mg | ORAL_TABLET | Freq: Two times a day (BID) | ORAL | Status: DC
  Administered 2023-10-02 – 2023-10-04 (×5): 40 mg via ORAL
  Filled 2023-10-02 (×5): qty 1
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
## 2023-10-02 NOTE — Plan of Care (Signed)

## 2023-10-02 NOTE — Progress Notes (Signed)
   10/02/23 2233  BiPAP/CPAP/SIPAP  BiPAP/CPAP/SIPAP Pt Type Adult  BiPAP/CPAP/SIPAP V60  Mask Type Full face mask  Mask Size Large  Set Rate 10 breaths/min  Respiratory Rate 18 breaths/min  IPAP 18 cmH20  EPAP 6 cmH2O  FiO2 (%) 50 %  Minute Ventilation 12.1  Leak 16  Peak Inspiratory Pressure (PIP) 5  Tidal Volume (Vt) 467  Patient Home Equipment No  Auto Titrate No  Press High Alarm 25 cmH2O  Press Low Alarm 5 cmH2O  BiPAP/CPAP /SiPAP Vitals  SpO2 94 %  Bilateral Breath Sounds Clear;Diminished

## 2023-10-02 NOTE — Plan of Care (Signed)

## 2023-10-03 ENCOUNTER — Inpatient Hospital Stay (HOSPITAL_COMMUNITY): Payer: No Typology Code available for payment source

## 2023-10-03 DIAGNOSIS — S2241XA Multiple fractures of ribs, right side, initial encounter for closed fracture: Secondary | ICD-10-CM | POA: Diagnosis not present

## 2023-10-03 DIAGNOSIS — S270XXA Traumatic pneumothorax, initial encounter: Secondary | ICD-10-CM | POA: Diagnosis not present

## 2023-10-03 LAB — CBC
HCT: 27.3 % — ABNORMAL LOW (ref 39.0–52.0)
Hemoglobin: 8.4 g/dL — ABNORMAL LOW (ref 13.0–17.0)
MCH: 19.1 pg — ABNORMAL LOW (ref 26.0–34.0)
MCHC: 30.8 g/dL (ref 30.0–36.0)
MCV: 62 fL — ABNORMAL LOW (ref 80.0–100.0)
Platelets: 351 10*3/uL (ref 150–400)
RBC: 4.4 MIL/uL (ref 4.22–5.81)
RDW: 15.8 % — ABNORMAL HIGH (ref 11.5–15.5)
WBC: 11.5 10*3/uL — ABNORMAL HIGH (ref 4.0–10.5)
nRBC: 0 % (ref 0.0–0.2)

## 2023-10-03 LAB — BASIC METABOLIC PANEL
Anion gap: 11 (ref 5–15)
BUN: 62 mg/dL — ABNORMAL HIGH (ref 8–23)
CO2: 27 mmol/L (ref 22–32)
Calcium: 9.5 mg/dL (ref 8.9–10.3)
Chloride: 100 mmol/L (ref 98–111)
Creatinine, Ser: 2.24 mg/dL — ABNORMAL HIGH (ref 0.61–1.24)
GFR, Estimated: 32 mL/min — ABNORMAL LOW (ref >60)
Glucose, Bld: 130 mg/dL — ABNORMAL HIGH (ref 70–99)
Potassium: 4.8 mmol/L (ref 3.5–5.1)
Sodium: 138 mmol/L (ref 135–145)

## 2023-10-03 LAB — BRAIN NATRIURETIC PEPTIDE: B Natriuretic Peptide: 277 pg/mL — ABNORMAL HIGH (ref 0.0–100.0)

## 2023-10-03 MED ORDER — HYDROMORPHONE HCL 1 MG/ML IJ SOLN
1.0000 mg | INTRAMUSCULAR | Status: DC | PRN
  Administered 2023-10-03 – 2023-10-04 (×6): 1 mg via INTRAVENOUS
  Filled 2023-10-03 (×5): qty 1

## 2023-10-03 NOTE — Plan of Care (Signed)
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
## 2023-10-03 NOTE — Progress Notes (Signed)
Pt chest tube removed per order. Clean sterile Vaseline gauze dsg applied, dsg clean, dry and intact. No drainage or bleeding noted upon removal of chest tube. Chest tube catheter tip clean and intact with no abnormal discharge noted. Pt oxygenation remains at 95% on 6 L High flow, pt educated and encouraged to use his incentive spirometer, deep breathing exercises and coughing. IS up to 1250 ml. Will continue to monitor pt closely. Dionne Bucy RN

## 2023-10-03 NOTE — TOC Progression Note (Addendum)
Transition of Care [facility]) - Progression Note    Patient Details  Name: Jeremy Douglas MRN: [id number] Date of Birth: [birth year]  Transition of Care [facility]) CM/SW Contact  Glennon Mac, RN Phone Number: 10/03/2023, 5:14 PM  Clinical Narrative:    Patient reaching medical stability for discharge; chest tube removed today, per order.  Patient states he has home oxygen at 5 to 6 L provided by [facility], as arranged by the [facility].  He states that his daughter will be providing assistance; he states he will instruct her to bring a portable oxygen tank for transport home.  Patient states he has a regular rolling walker at home but rollator is recommended.  Will order rollator from the [place], which will be shipped to patient's home after discharge.  PT/OT recommending home health follow-up, and patient is agreeable to services; referral to [facility] health for continued therapies.  Emailed required documentation to the [facility] regarding home health and DME.  Primary care physician is Dr. Selena Batten at the [facility].   Expected Discharge Plan: Home w Home Health Services Barriers to Discharge: Continued Medical Work up  Expected Discharge Plan and Services   Discharge Planning Services: CM Consult   Living arrangements for the past 2 months: Single Family Home                 DME Arranged: Walker rolling with seat DME Agency: [facility] - [facility] Date DME Agency Contacted: 10/03/23 Time DME Agency Contacted: 1714   HH Arranged: PT, OT HH Agency: [facility] Date HH Agency Contacted: 10/03/23 Time HH Agency Contacted: 1714 Representative spoke with at [facility] Agency: Lorenza Chick   Social Determinants of Health (SDOH) Interventions SDOH Screenings   Food Insecurity: No Food Insecurity (09/25/2023)  Housing: Low Risk  (09/25/2023)  Transportation Needs: No Transportation Needs (09/25/2023)  Utilities: Not At Risk (09/25/2023)  Depression  (PHQ2-9): Medium Risk (03/29/2023)  Tobacco Use: High Risk (09/24/2023)    Readmission Risk Interventions    07/13/2022   12:20 PM  Readmission Risk Prevention Plan  Transportation Screening Complete  PCP or Specialist Appt within 3-5 Days Complete  HRI or Home Care Consult Complete  Social Work Consult for Recovery Care Planning/Counseling Complete  Palliative Care Screening Not Applicable  Medication Review (RN Care Manager) Referral to Pharmacy   Quintella Baton, RN, BSN  Trauma/Neuro ICU Case Manager [phone]

## 2023-10-04 ENCOUNTER — Other Ambulatory Visit (HOSPITAL_COMMUNITY): Payer: Self-pay

## 2023-10-04 DIAGNOSIS — J441 Chronic obstructive pulmonary disease with (acute) exacerbation: Secondary | ICD-10-CM

## 2023-10-04 DIAGNOSIS — F17200 Nicotine dependence, unspecified, uncomplicated: Secondary | ICD-10-CM

## 2023-10-04 DIAGNOSIS — S2241XA Multiple fractures of ribs, right side, initial encounter for closed fracture: Secondary | ICD-10-CM | POA: Diagnosis not present

## 2023-10-04 DIAGNOSIS — S270XXA Traumatic pneumothorax, initial encounter: Secondary | ICD-10-CM | POA: Diagnosis not present

## 2023-10-04 LAB — BASIC METABOLIC PANEL
Anion gap: 9 (ref 5–15)
BUN: 58 mg/dL — ABNORMAL HIGH (ref 8–23)
CO2: 30 mmol/L (ref 22–32)
Calcium: 10 mg/dL (ref 8.9–10.3)
Chloride: 101 mmol/L (ref 98–111)
Creatinine, Ser: 2.15 mg/dL — ABNORMAL HIGH (ref 0.61–1.24)
GFR, Estimated: 33 mL/min — ABNORMAL LOW (ref >60)
Glucose, Bld: 123 mg/dL — ABNORMAL HIGH (ref 70–99)
Potassium: 4.7 mmol/L (ref 3.5–5.1)
Sodium: 140 mmol/L (ref 135–145)

## 2023-10-04 MED ORDER — CLONIDINE HCL 0.1 MG PO TABS
0.3000 mg | ORAL_TABLET | Freq: Two times a day (BID) | ORAL | Status: DC
  Administered 2023-10-04: 0.3 mg via ORAL
  Filled 2023-10-04: qty 3

## 2023-10-04 MED ORDER — METHOCARBAMOL 500 MG PO TABS
1000.0000 mg | ORAL_TABLET | Freq: Three times a day (TID) | ORAL | 0 refills | Status: DC
  Filled 2023-10-04: qty 80, 10d supply, fill #0

## 2023-10-04 MED ORDER — OXYCODONE HCL 10 MG PO TABS
15.0000 mg | ORAL_TABLET | ORAL | 0 refills | Status: DC | PRN
  Filled 2023-10-04: qty 20, 2d supply, fill #0

## 2023-10-04 MED ORDER — PREDNISONE 20 MG PO TABS
40.0000 mg | ORAL_TABLET | Freq: Every day | ORAL | 0 refills | Status: AC
  Filled 2023-10-04: qty 2, 1d supply, fill #0
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
## 2023-10-04 NOTE — Progress Notes (Signed)
Pt discharge education and instructions completed with pt. Pt voiced understanding and denies any questions. Pt IV and telemetry removed prior to discharge. Pt handed his TOC medications which included his Robaxin, Oxycodone and predniSONE. Pt discharged home with daughter to transport him home. Pt transported off unit via wheelchair with is belongings to the side including pt's personal CPAP machine and backpack. Pt remained on 6L oxygen [place] and oxygen changed from unit portable O2 to pt's personal oxygen tank in car. Dionne Bucy RN
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
## 2023-10-04 NOTE — TOC Transition Note (Signed)
Transition of Care [facility]) - CM/SW Discharge Note   Patient Details  Name: Jeremy Douglas MRN: [id number] Date of Birth: [birth year]  [facility] of [facility]) CM/SW Contact:  Glennon Mac, RN Phone Number: 10/04/2023, 4:30 PM   Clinical Narrative:    Patient medically stable for dc home today with daughter to provide needed assistance.  She is bringing portable oxygen tank for transport home.  [facility] to follow at home for continued therapies.   Final next level of care: Home w Home Health Services Barriers to Discharge: Barriers Resolved   Patient Goals and CMS Choice CMS Medicare.gov Compare Post Acute Care list provided to:: Patient Choice offered to / list presented to : Patient                        Discharge Plan and Services Additional resources added to the After Visit Summary for     Discharge Planning Services: CM Consult Post Acute Care Choice: Home Health          DME Arranged: Walker rolling with seat DME Agency: Other - Comment ([facility]) Date DME Agency Contacted: 10/03/23 Time DME Agency Contacted: 1714   HH Arranged: PT, OT HH Agency: [facility] Date [facility] Agency Contacted: 10/03/23 Time HH Agency Contacted: 1714 Representative spoke with at [facility] Agency: Lorenza Chick  Social Determinants of Health (SDOH) Interventions SDOH Screenings   Food Insecurity: No Food Insecurity (09/25/2023)  Housing: Low Risk  (09/25/2023)  Transportation Needs: No Transportation Needs (09/25/2023)  Utilities: Not At Risk (09/25/2023)  Depression (PHQ2-9): Medium Risk (03/29/2023)  Tobacco Use: High Risk (09/24/2023)     Readmission Risk Interventions    07/13/2022   12:20 PM  Readmission Risk Prevention Plan  Transportation Screening Complete  PCP or Specialist Appt within 3-5 Days Complete  HRI or Home Care Consult Complete  Social Work Consult for Recovery Care Planning/Counseling Complete  Palliative Care Screening Not Applicable  Medication Review (RN  Care Manager) Referral to Pharmacy   Quintella Baton, RN, BSN  Trauma/Neuro ICU Case Manager [phone]
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
## 2023-11-22 ENCOUNTER — Observation Stay (HOSPITAL_COMMUNITY)
Admission: EM | Admit: 2023-11-22 | Discharge: 2023-11-24 | Disposition: A | Discharge status: Home Health | Payer: No Typology Code available for payment source | Attending: Internal Medicine | Admitting: Internal Medicine

## 2023-11-22 ENCOUNTER — Other Ambulatory Visit: Payer: Self-pay

## 2023-11-22 DIAGNOSIS — R5381 Other malaise: Secondary | ICD-10-CM | POA: Insufficient documentation

## 2023-11-22 DIAGNOSIS — Z87891 Personal history of nicotine dependence: Secondary | ICD-10-CM | POA: Insufficient documentation

## 2023-11-22 DIAGNOSIS — J449 Chronic obstructive pulmonary disease, unspecified: Secondary | ICD-10-CM | POA: Insufficient documentation

## 2023-11-22 DIAGNOSIS — I10 Essential (primary) hypertension: Secondary | ICD-10-CM | POA: Diagnosis present

## 2023-11-22 DIAGNOSIS — D509 Iron deficiency anemia, unspecified: Secondary | ICD-10-CM | POA: Diagnosis present

## 2023-11-22 DIAGNOSIS — I5032 Chronic diastolic (congestive) heart failure: Secondary | ICD-10-CM | POA: Diagnosis present

## 2023-11-22 DIAGNOSIS — N1832 Chronic kidney disease, stage 3b: Secondary | ICD-10-CM | POA: Diagnosis not present

## 2023-11-22 DIAGNOSIS — N179 Acute kidney failure, unspecified: Principal | ICD-10-CM | POA: Diagnosis present

## 2023-11-22 DIAGNOSIS — R799 Abnormal finding of blood chemistry, unspecified: Secondary | ICD-10-CM | POA: Diagnosis present

## 2023-11-22 DIAGNOSIS — Z96653 Presence of artificial knee joint, bilateral: Secondary | ICD-10-CM | POA: Insufficient documentation

## 2023-11-22 DIAGNOSIS — I130 Hypertensive heart and chronic kidney disease with heart failure and stage 1 through stage 4 chronic kidney disease, or unspecified chronic kidney disease: Secondary | ICD-10-CM | POA: Insufficient documentation

## 2023-11-22 LAB — CBC WITH DIFFERENTIAL/PLATELET
Abs Immature Granulocytes: 0.01 10*3/uL (ref 0.00–0.07)
Basophils Absolute: 0 10*3/uL (ref 0.0–0.1)
Basophils Relative: 0 %
Eosinophils Absolute: 0.2 10*3/uL (ref 0.0–0.5)
Eosinophils Relative: 2 %
HCT: 27.9 % — ABNORMAL LOW (ref 39.0–52.0)
Hemoglobin: 8.5 g/dL — ABNORMAL LOW (ref 13.0–17.0)
Immature Granulocytes: 0 %
Lymphocytes Relative: 37 %
Lymphs Abs: 3.4 10*3/uL (ref 0.7–4.0)
MCH: 18.7 pg — ABNORMAL LOW (ref 26.0–34.0)
MCHC: 30.5 g/dL (ref 30.0–36.0)
MCV: 61.3 fL — ABNORMAL LOW (ref 80.0–100.0)
Monocytes Absolute: 0.8 10*3/uL (ref 0.1–1.0)
Monocytes Relative: 8 %
Neutro Abs: 4.7 10*3/uL (ref 1.7–7.7)
Neutrophils Relative %: 53 %
Platelets: 237 10*3/uL (ref 150–400)
RBC: 4.55 MIL/uL (ref 4.22–5.81)
RDW: 15.4 % (ref 11.5–15.5)
WBC: 9.1 10*3/uL (ref 4.0–10.5)
nRBC: 0 % (ref 0.0–0.2)

## 2023-11-22 LAB — COMPREHENSIVE METABOLIC PANEL
ALT: 11 U/L (ref 0–44)
AST: 15 U/L (ref 15–41)
Albumin: 3.8 g/dL (ref 3.5–5.0)
Alkaline Phosphatase: 87 U/L (ref 38–126)
Anion gap: 10 (ref 5–15)
BUN: 124 mg/dL — ABNORMAL HIGH (ref 8–23)
CO2: 24 mmol/L (ref 22–32)
Calcium: 9.5 mg/dL (ref 8.9–10.3)
Chloride: 101 mmol/L (ref 98–111)
Creatinine, Ser: 3.66 mg/dL — ABNORMAL HIGH (ref 0.61–1.24)
GFR, Estimated: 17 mL/min — ABNORMAL LOW (ref >60)
Glucose, Bld: 119 mg/dL — ABNORMAL HIGH (ref 70–99)
Potassium: 4.7 mmol/L (ref 3.5–5.1)
Sodium: 135 mmol/L (ref 135–145)
Total Bilirubin: 0.7 mg/dL (ref <1.2)
Total Protein: 7.4 g/dL (ref 6.5–8.1)

## 2023-11-22 LAB — I-STAT CG4 LACTIC ACID, ED: Lactic Acid, Venous: 0.6 mmol/L (ref 0.5–1.9)

## 2023-11-22 LAB — MAGNESIUM: Magnesium: 2.5 mg/dL — ABNORMAL HIGH (ref 1.7–2.4)

## 2023-11-22 MED ORDER — SODIUM CHLORIDE 0.9 % IV SOLN: Freq: Once | INTRAVENOUS | Status: AC

## 2023-11-22 MED ORDER — ENOXAPARIN SODIUM 30 MG/0.3ML IJ SOSY
30.0000 mg | PREFILLED_SYRINGE | Freq: Every day | INTRAMUSCULAR | Status: DC
  Administered 2023-11-23 – 2023-11-24 (×2): 30 mg via SUBCUTANEOUS
  Filled 2023-11-22 (×2): qty 0.3

## 2023-11-22 NOTE — ED Provider Notes (Signed)
[place] EMERGENCY DEPARTMENT AT [facility] Provider Note   CSN: [id number] Arrival date & time: 11/22/23  1807     History {Add pertinent medical, surgical, social history, OB history to HPI:1} Chief Complaint  Patient presents with   Abnormal Labs    Jeremy Douglas is a 67 y.o. male.  HPI     Home Medications Prior to Admission medications   Medication Sig Start Date End Date Taking? Authorizing Provider  acamprosate (CAMPRAL) 333 MG tablet Take 333 mg by mouth 3 (three) times daily. 06/06/23   [provider]  albuterol (VENTOLIN HFA) 108 (90 Base) MCG/ACT inhaler Inhale 2 puffs into the lungs every 6 (six) hours as needed for wheezing or shortness of breath. 07/13/22   Leroy Sea, MD  Cholecalciferol (VITAMIN D-3 PO) Take 1 tablet by mouth daily.    [provider]  cloNIDine (CATAPRES) 0.2 MG tablet Take 0.2 mg by mouth 2 (two) times daily. Hold for heart rate less than 60 or systolic blood pressure less than 110.    [provider]  Cyanocobalamin (VITAMIN B-12 PO) Take 1 tablet by mouth daily.    [provider]  cyclobenzaprine (FLEXERIL) 10 MG tablet Take 10 mg by mouth 3 (three) times daily. 05/02/23   [provider]  DULoxetine (CYMBALTA) 30 MG capsule Take 60 mg by mouth daily.    [provider]  ferrous sulfate 325 (65 FE) MG EC tablet Take 325 mg by mouth daily with breakfast.    [provider]  folic acid (FOLVITE) 1 MG tablet Take 1 mg by mouth daily.    [provider]  furosemide (LASIX) 80 MG tablet Take 40-80 mg by mouth See admin instructions. Take 1 tablet by mouth in the morning and 1/2 tablet in the evening    [provider]  hydrALAZINE (APRESOLINE) 100 MG tablet Take 1 tablet (100 mg total) by mouth 3 (three) times daily. 07/13/22 09/24/24  Leroy Sea, MD  losartan (COZAAR) 50 MG tablet Take 25 mg by mouth daily.    [provider]   MAGNESIUM PO Take 1 tablet by mouth daily.    [provider]  methocarbamol (ROBAXIN) 500 MG tablet Take 2 tablets (1,000 mg total) by mouth 4 (four) times daily -  before meals and at bedtime. 10/04/23   Adam Phenix, PA-C  naloxone [facility]) nasal spray 4 mg/0.1 mL Place 1 spray into the nose once as needed (For opioid overdose). 03/05/22   [provider]  Oxycodone HCl 10 MG TABS Take 1.5-2 tablets (15-20 mg total) by mouth every 4 (four) hours as needed (15mg  for moderate pain, 20mg  for severe pain). 10/04/23   Adam Phenix, PA-C  spironolactone (ALDACTONE) 25 MG tablet Take 25 mg by mouth daily.    [provider]  traZODone (DESYREL) 50 MG tablet Take 1 tablet (50 mg total) by mouth at bedtime as needed for sleep. Patient taking differently: Take 100 mg by mouth at bedtime as needed for sleep. 03/30/23   Bing Neighbors, NP      Allergies    Amlodipine    Review of Systems   Review of Systems  Physical Exam Updated Vital Signs BP 134/65   Pulse (!) 58   Temp 97.9 F (36.6 C) (Oral)   Resp 16   Ht 5\' 11"  (1.803 m)   Wt 83.9 kg   SpO2 97%   BMI 25.80 kg/m  Physical Exam  ED Results / Procedures / Treatments   Labs (all labs ordered are listed, but only abnormal results are displayed) Labs Reviewed  CBC WITH DIFFERENTIAL/PLATELET - Abnormal; Notable for the following components:      Result Value   Hemoglobin 8.5 (*)    HCT 27.9 (*)    MCV 61.3 (*)    MCH 18.7 (*)    All other components within normal limits  COMPREHENSIVE METABOLIC PANEL - Abnormal; Notable for the following components:   Glucose, Bld 119 (*)    BUN 124 (*)    Creatinine, Ser 3.66 (*)    GFR, Estimated 17 (*)    All other components within normal limits  MAGNESIUM - Abnormal; Notable for the following components:   Magnesium 2.5 (*)    All other components within normal limits  I-STAT CG4 LACTIC ACID, ED    EKG None  Radiology No results  found.  Procedures Procedures  {Document cardiac monitor, telemetry assessment procedure when appropriate:1}  Medications Ordered in ED Medications - No data to display  ED Course/ Medical Decision Making/ A&P   {   Click here for ABCD2, HEART and other calculatorsREFRESH Note before signing :1}                              Medical Decision Making  ***  {Document critical care time when appropriate:1} {Document review of labs and clinical decision tools ie heart score, Chads2Vasc2 etc:1}  {Document your independent review of radiology images, and any outside records:1} {Document your discussion with family members, caretakers, and with consultants:1} {Document social determinants of health affecting pt's care:1} {Document your decision making why or why not admission, treatments were needed:1} Final Clinical Impression(s) / ED Diagnoses Final diagnoses:  None    Rx / DC Orders ED Discharge Orders     None

## 2023-11-22 NOTE — ED Provider Triage Note (Signed)
Emergency Medicine Provider Triage Evaluation Note  Jeremy Douglas , a 66 y.o. male  was evaluated in triage.  Pt complains of feeling lethargic, decreased appetite and fluid intake.  [facility] and [facility].  His daughter was called today due to BUN being 117 and Cr being 3.22.  Patient has history of CKD, but this is worse than his baseline.  They advised patient to go to ED for further evaluation and possible IV rehydration.   Review of Systems  Positive: As above Negative: As above  Physical Exam  Ht 5\' 11"  (1.803 m)   Wt 83.9 kg   BMI 25.80 kg/m  Gen:   Awake, no distress   Resp:  Normal effort  MSK:   Moves extremities without difficulty  Other:    Medical Decision Making  Medically screening exam initiated at 6:40 PM.  Appropriate orders placed.  Jeremy Douglas was informed that the remainder of the evaluation will be completed by another provider, this initial triage assessment does not replace that evaluation, and the importance of remaining in the ED until their evaluation is complete.     Lenard Simmer, PA-C 11/22/23 1842

## 2023-11-22 NOTE — ED Notes (Signed)
Ist lac 2nd not needed can be discontinued unless Dr says otherwise

## 2023-11-22 NOTE — ED Triage Notes (Signed)
Pt arrived POV c/o not feeling well x 3 weeks, tired, lethargic, decreased appetite, went to [place] on Wed and was told to come to ER for elevated BUN and creatinine. Pt w/ hx CKD.

## 2023-11-23 ENCOUNTER — Inpatient Hospital Stay (HOSPITAL_COMMUNITY): Payer: No Typology Code available for payment source

## 2023-11-23 DIAGNOSIS — N179 Acute kidney failure, unspecified: Secondary | ICD-10-CM

## 2023-11-23 LAB — VITAMIN B12: Vitamin B-12: 845 pg/mL (ref 180–914)

## 2023-11-23 LAB — CBC
HCT: 25.7 % — ABNORMAL LOW (ref 39.0–52.0)
HCT: 26.6 % — ABNORMAL LOW (ref 39.0–52.0)
Hemoglobin: 7.8 g/dL — ABNORMAL LOW (ref 13.0–17.0)
Hemoglobin: 8.2 g/dL — ABNORMAL LOW (ref 13.0–17.0)
MCH: 18.5 pg — ABNORMAL LOW (ref 26.0–34.0)
MCH: 19.2 pg — ABNORMAL LOW (ref 26.0–34.0)
MCHC: 30.4 g/dL (ref 30.0–36.0)
MCHC: 30.8 g/dL (ref 30.0–36.0)
MCV: 61 fL — ABNORMAL LOW (ref 80.0–100.0)
MCV: 62.1 fL — ABNORMAL LOW (ref 80.0–100.0)
Platelets: 216 10*3/uL (ref 150–400)
Platelets: 217 10*3/uL (ref 150–400)
RBC: 4.21 MIL/uL — ABNORMAL LOW (ref 4.22–5.81)
RBC: 4.28 MIL/uL (ref 4.22–5.81)
RDW: 15.4 % (ref 11.5–15.5)
RDW: 15.4 % (ref 11.5–15.5)
WBC: 6.8 10*3/uL (ref 4.0–10.5)
WBC: 8 10*3/uL (ref 4.0–10.5)
nRBC: 0 % (ref 0.0–0.2)
nRBC: 0 % (ref 0.0–0.2)

## 2023-11-23 LAB — IRON AND TIBC
Iron: 112 ug/dL (ref 45–182)
Saturation Ratios: 36 % (ref 17.9–39.5)
TIBC: 312 ug/dL (ref 250–450)
UIBC: 200 ug/dL

## 2023-11-23 LAB — RENAL FUNCTION PANEL
Albumin: 3.3 g/dL — ABNORMAL LOW (ref 3.5–5.0)
Anion gap: 7 (ref 5–15)
BUN: 112 mg/dL — ABNORMAL HIGH (ref 8–23)
CO2: 25 mmol/L (ref 22–32)
Calcium: 9.1 mg/dL (ref 8.9–10.3)
Chloride: 105 mmol/L (ref 98–111)
Creatinine, Ser: 3.07 mg/dL — ABNORMAL HIGH (ref 0.61–1.24)
GFR, Estimated: 22 mL/min — ABNORMAL LOW (ref >60)
Glucose, Bld: 138 mg/dL — ABNORMAL HIGH (ref 70–99)
Phosphorus: 4.2 mg/dL (ref 2.5–4.6)
Potassium: 4.1 mmol/L (ref 3.5–5.1)
Sodium: 137 mmol/L (ref 135–145)

## 2023-11-23 LAB — URINALYSIS, ROUTINE W REFLEX MICROSCOPIC
Bacteria, UA: NONE SEEN
Bilirubin Urine: NEGATIVE
Glucose, UA: NEGATIVE mg/dL
Hgb urine dipstick: NEGATIVE
Ketones, ur: NEGATIVE mg/dL
Leukocytes,Ua: NEGATIVE
Nitrite: NEGATIVE
Protein, ur: 100 mg/dL — AB
Specific Gravity, Urine: 1.012 (ref 1.005–1.030)
pH: 7 (ref 5.0–8.0)

## 2023-11-23 LAB — RETICULOCYTES
Immature Retic Fract: 1.9 % — ABNORMAL LOW (ref 2.3–15.9)
RBC.: 4.2 MIL/uL — ABNORMAL LOW (ref 4.22–5.81)
Retic Count, Absolute: 29.8 10*3/uL (ref 19.0–186.0)
Retic Ct Pct: 0.7 % (ref 0.4–3.1)

## 2023-11-23 LAB — CREATININE, SERUM
Creatinine, Ser: 3.3 mg/dL — ABNORMAL HIGH (ref 0.61–1.24)
GFR, Estimated: 20 mL/min — ABNORMAL LOW (ref >60)

## 2023-11-23 MED ORDER — FOLIC ACID 1 MG PO TABS
1.0000 mg | ORAL_TABLET | Freq: Every day | ORAL | Status: DC
  Administered 2023-11-23 – 2023-11-24 (×2): 1 mg via ORAL
  Filled 2023-11-23 (×2): qty 1

## 2023-11-23 MED ORDER — VITAMIN B-12 1000 MCG PO TABS
1000.0000 ug | ORAL_TABLET | Freq: Every day | ORAL | Status: DC
  Administered 2023-11-23 – 2023-11-24 (×2): 1000 ug via ORAL
  Filled 2023-11-23 (×2): qty 1

## 2023-11-23 MED ORDER — ALBUTEROL SULFATE (2.5 MG/3ML) 0.083% IN NEBU
2.5000 mg | INHALATION_SOLUTION | Freq: Four times a day (QID) | RESPIRATORY_TRACT | Status: DC | PRN
Start: 2023-11-23 — End: 2023-11-24

## 2023-11-23 MED ORDER — POLYETHYLENE GLYCOL 3350 17 G PO PACK: 17.0000 g | PACK | Freq: Every day | ORAL | Status: DC | PRN

## 2023-11-23 MED ORDER — ACETAMINOPHEN 325 MG PO TABS
650.0000 mg | ORAL_TABLET | Freq: Four times a day (QID) | ORAL | Status: DC | PRN
  Administered 2023-11-23 – 2023-11-24 (×3): 650 mg via ORAL
  Filled 2023-11-23 (×2): qty 2

## 2023-11-23 MED ORDER — PROCHLORPERAZINE EDISYLATE 10 MG/2ML IJ SOLN: 5.0000 mg | Freq: Four times a day (QID) | INTRAMUSCULAR | Status: DC | PRN

## 2023-11-23 MED ORDER — MELATONIN 5 MG PO TABS: 5.0000 mg | ORAL_TABLET | Freq: Every evening | ORAL | Status: DC | PRN

## 2023-11-23 MED ORDER — OXYCODONE HCL 5 MG PO TABS
5.0000 mg | ORAL_TABLET | Freq: Four times a day (QID) | ORAL | Status: DC | PRN
  Administered 2023-11-23 – 2023-11-24 (×5): 5 mg via ORAL
  Filled 2023-11-23 (×5): qty 1
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
## 2023-11-23 NOTE — H&P (Addendum)
History and Physical  Jeremy Douglas EPP:[id number] DOB: 19-Nov-1956 DOA: 11/22/2023  Referring physician: Dr. Donnald Garre, EDP  [facility]P: [facility]  Outpatient Specialists: None Patient coming from: Home  Chief Complaint: Abnormal labs  HPI: Jeremy Douglas is a 67 y.o. male with medical history significant for hypertension, HFpEF, COPD, CKD 3B, who presents to the ED referred by the [facility] after abnormal lab results.  The patient was found to have significantly elevated creatinine above baseline.  Endorses 3 weeks of not feeling well with poor appetite and lack of energy.  Denies subjective fevers or chills.  In the ED, hypertensive.  Lab studies notable for creatinine of 3.66 from baseline of 2.1.  EDP requested admission for further workup and management of his AKI on CKD 3B.  Admitted by [facility], hospitalist service.  ED Course: Tmax 97.9.  BP 153/70, pulse 60, respiratory rate 16, O2 saturation 92% on room air.  Lab studies notable for creatinine 3.66, BUN 124, magnesium 2.5, GFR 17, glucose 119.  Hemoglobin of 8.2, MCV 62, WBC 8.0, platelet count 216.  Review of Systems: Review of systems as noted in the HPI. All other systems reviewed and are negative.   Past Medical History:  Diagnosis Date   Alcohol dependence (HCC) 09/07/2012   Anemia    COPD (chronic obstructive pulmonary disease) (HCC)    Esophagitis 09/07/2012   Per EGD 04/2011   Gastritis 09/07/2012   Per EGD 04/2011   Headache    4 - 5 a yr (no migraines)   Heart murmur    Hypertension    takes Amlodipine,Labetalol,and Clonidine daily   Ischemic chest pain (HCC)    Obesity    OSA treated with BiPAP    Osteoarthritis    "knees" (12/03/2018)   Pulmonary emboli (HCC) 02/2018   Thrombocytopenia (HCC) 09/07/2012   Hx of in past.   Tobacco abuse    Past Surgical History:  Procedure Laterality Date   COLONOSCOPY W/ BIOPSIES AND POLYPECTOMY  2010   "it was negative" (12/03/2018)   JOINT REPLACEMENT     KNEE  ARTHROSCOPY Left 09/18/2013   Procedure: ARTHROSCOPY KNEE, PARTIAL MEDIAL AND LATERAL MENISCECTOMY, CHONDROPLASTY PATELLA FEMORAL JOINT;  Surgeon: Harvie Junior, MD;  Location: [facility];  Service: Orthopedics;  Laterality: Left;   KNEE ARTHROSCOPY Right 06/20/2011   TONSILLECTOMY     as a child   TOTAL KNEE ARTHROPLASTY Left 03/19/2014   Procedure: LEFT TOTAL KNEE ARTHROPLASTY;  Surgeon: Harvie Junior, MD;  [facility];  Service: Orthopedics;  Laterality: Left;   TOTAL KNEE ARTHROPLASTY Right 10/15/2014   Procedure: RIGHT TOTAL KNEE ARTHROPLASTY;  Surgeon: Harvie Junior, MD;  Location: [facility];  Service: Orthopedics;  Laterality: Right;   UPPER GI ENDOSCOPY      Social History:  reports that he has been smoking cigarettes. He has a 20.5 pack-year smoking history. He has never used smokeless tobacco. He reports that he does not currently use alcohol after a past usage of about 1.0 standard drink of alcohol per week. He reports that he does not currently use drugs after having used the following drugs: Marijuana.   Allergies  Allergen Reactions   Amlodipine Swelling and Other (See Comments)    Pt cannot tolerate 10mg      Family History  Problem Relation Age of Onset   CAD Father       Prior to Admission medications   Medication Sig Start Date End Date Taking? Authorizing Provider  acamprosate (CAMPRAL) 333 MG tablet Take 333 mg by mouth 3 (three) times daily. 06/06/23   [provider]  albuterol (VENTOLIN HFA) 108 (90 Base) MCG/ACT inhaler Inhale 2 puffs into the lungs every 6 (six) hours as needed for wheezing or shortness of breath. 07/13/22   Leroy Sea, MD  Cholecalciferol (VITAMIN D-3 PO) Take 1 tablet by mouth daily.    [provider]  cloNIDine (CATAPRES) 0.2 MG tablet Take 0.2 mg by mouth 2 (two) times daily. Hold for heart rate less than 60 or systolic blood pressure less than 110.    [provider]  Cyanocobalamin (VITAMIN  B-12 PO) Take 1 tablet by mouth daily.    [provider]  cyclobenzaprine (FLEXERIL) 10 MG tablet Take 10 mg by mouth 3 (three) times daily. 05/02/23   [provider]  DULoxetine (CYMBALTA) 30 MG capsule Take 60 mg by mouth daily.    [provider]  ferrous sulfate 325 (65 FE) MG EC tablet Take 325 mg by mouth daily with breakfast.    [provider]  folic acid (FOLVITE) 1 MG tablet Take 1 mg by mouth daily.    [provider]  furosemide (LASIX) 80 MG tablet Take 40-80 mg by mouth See admin instructions. Take 1 tablet by mouth in the morning and 1/2 tablet in the evening    [provider]  hydrALAZINE (APRESOLINE) 100 MG tablet Take 1 tablet (100 mg total) by mouth 3 (three) times daily. 07/13/22 09/24/24  Leroy Sea, MD  losartan (COZAAR) 50 MG tablet Take 25 mg by mouth daily.    [provider]  MAGNESIUM PO Take 1 tablet by mouth daily.    [provider]  methocarbamol (ROBAXIN) 500 MG tablet Take 2 tablets (1,000 mg total) by mouth 4 (four) times daily -  before meals and at bedtime. 10/04/23   Adam Phenix, PA-C  naloxone [facility] Dt) nasal spray 4 mg/0.1 mL Place 1 spray into the nose once as needed (For opioid overdose). 03/05/22   [provider]  Oxycodone HCl 10 MG TABS Take 1.5-2 tablets (15-20 mg total) by mouth every 4 (four) hours as needed (15mg  for moderate pain, 20mg  for severe pain). 10/04/23   Adam Phenix, PA-C  spironolactone (ALDACTONE) 25 MG tablet Take 25 mg by mouth daily.    [provider]  traZODone (DESYREL) 50 MG tablet Take 1 tablet (50 mg total) by mouth at bedtime as needed for sleep. Patient taking differently: Take 100 mg by mouth at bedtime as needed for sleep. 03/30/23   Bing Neighbors, NP    Physical Exam: BP 134/65   Pulse (!) 58   Temp 97.9 F (36.6 C) (Oral)   Resp 17   Ht 5\' 11"  (1.803 m)   Wt 83.9 kg   SpO2 97%   BMI 25.80 kg/m    General: 67 y.o. year-old male well developed well nourished in no acute distress.  Alert and oriented x3. Cardiovascular: Regular rate and rhythm with no rubs or gallops.  No thyromegaly or JVD noted.  No lower extremity edema. 2/4 pulses in all 4 extremities. Respiratory: Clear to auscultation with no wheezes or rales. Good inspiratory effort. Abdomen: Soft nontender nondistended with normal bowel sounds x4 quadrants. Muskuloskeletal: No cyanosis, clubbing or edema noted bilaterally Neuro: CN II-XII intact, strength, sensation, reflexes Skin: No ulcerative lesions noted or rashes Psychiatry: Judgement and insight appear normal. Mood is appropriate for condition and setting  Labs on Admission:  Basic Metabolic Panel: Recent Labs  Lab 11/22/23 1843  NA 135  K 4.7  CL 101  CO2 24  GLUCOSE 119*  BUN 124*  CREATININE 3.66*  CALCIUM 9.5  MG 2.5*   Liver Function Tests: Recent Labs  Lab 11/22/23 1843  AST 15  ALT 11  ALKPHOS 87  BILITOT 0.7  PROT 7.4  ALBUMIN 3.8   No results for input(s): "LIPASE", "AMYLASE" in the last 168 hours. No results for input(s): "AMMONIA" in the last 168 hours. CBC: Recent Labs  Lab 11/22/23 1843  WBC 9.1  NEUTROABS 4.7  HGB 8.5*  HCT 27.9*  MCV 61.3*  PLT 237   Cardiac Enzymes: No results for input(s): "CKTOTAL", "CKMB", "CKMBINDEX", "TROPONINI" in the last 168 hours.  BNP (last 3 results) Recent Labs    09/27/23 1603 10/02/23 0622 10/03/23 0735  BNP 432.7* 225.3* 277.0*    ProBNP (last 3 results) No results for input(s): "PROBNP" in the last 8760 hours.  CBG: No results for input(s): "GLUCAP" in the last 168 hours.  Radiological Exams on Admission: No results found.  EKG: I independently viewed the EKG done and my findings are as followed: None available at the time of this visit.  Assessment/Plan Present on Admission:  AKI (acute kidney injury) (HCC)  Principal Problem:   AKI (acute kidney injury)  (HCC)  AKI on CKD 3B, likely secondary to dehydration from poor oral intake Baseline creatinine appears to be 2.1 with GFR of 32 Presented with creatinine of 3.66 with GFR of 17 Avoid nephrotoxic agents, dehydration and hypotension Hold off home p.o. Lasix for now. Monitor urine output Repeat renal function panel in the morning.  Hypertension BP is not at goal, elevated Resume home oral antihypertensives as blood pressure tolerates Continue to closely monitor vital signs  Anemia of chronic disease Hemoglobin 8.2, at baseline No overt bleeding reported Continue to monitor  Generalized fatigue/generalized weakness PT OT evaluation Fall precautions.   Time: 75 minutes.   DVT prophylaxis: Subcu Lovenox daily  Code Status: Full code  Family Communication: None at bedside.  Disposition Plan: Admitted to telemetry medical unit  Consults called: None.  Admission status: Inpatient status.   Status is: Inpatient The patient requires at least 2 midnights for further evaluation and treatment of present condition.   Darlin Drop MD [facility] Pager [phone]  If 7PM-7AM, please contact night-coverage [email or web address] Password [facility]  11/23/2023, 12:32 AM

## 2023-11-23 NOTE — Progress Notes (Signed)
[facility] PROGRESS NOTE  Jeremy Douglas ION:[id number] DOB: April 06, 1956 DOA: 11/22/2023 [facility]  Status is: Inpatient Remains inpatient appropriate because: Acute renal failure  Barriers to discharge: Social:  Clinical:  Level of care: Telemetry Medical   Code Status: Full code Family Communication: Discussed with the patient DVT prophylaxis:  COVID vaccination status:  HPI:  Subjective: Denies having any compliance.  Some improvement with generalized weakness from the time of the admission.  Patient has been having poor appetite, lost some weight.  Patient has known history of thalassemia  Objective: Vitals:   11/23/23 0330 11/23/23 0345  BP: (!) 157/65 (!) 113/56  Pulse: 65 (!) 55  Resp: 13 10  Temp:    SpO2: (!) 89% 90%   No intake or output data in the 24 hours ending 11/23/23 0833 Filed Weights   11/22/23 1840 11/23/23 0500  Weight: 83.9 kg 88.4 kg    Exam:  Constitutional: NAD, calm, comfortable Eyes: PERRL, lids and conjunctivae normal ENMT: Mucous membranes are moist. Posterior pharynx clear of any exudate or lesions.Normal dentition.  Neck: normal, supple, no masses, no thyromegaly Respiratory: clear to auscultation bilaterally, no wheezing, no crackles. Normal respiratory effort. No accessory muscle use.  Cardiovascular: Regular rate and rhythm, no murmurs / rubs / gallops. No extremity edema. 2+ pedal pulses. No carotid bruits.  Abdomen: no tenderness, no masses palpated. No hepatosplenomegaly. Bowel sounds positive.  Musculoskeletal: no clubbing / cyanosis. No joint deformity upper and lower extremities. Good ROM, no contractures. Normal muscle tone.  Skin: no rashes, lesions, ulcers. No induration Neurologic: CN 2-12 grossly intact. Sensation intact, DTR normal. Strength 5/5 x all 4 extremities.  Psychiatric: Normal judgment and insight. Alert and oriented x 3. Normal mood.    Assessment/Plan:  67 year old male with  history of hypertension, HFpEF, COPD, CKD stage IIIb, thalassemia with chronic anemia presents to the emergency department referred by [facility] after patient was found to have abnormal labs with a significant elevation of the creatinine from his baseline.  The patient has not been well for the last 3 weeks with a poor appetite, lack of energy.  In the emergency department patient is found to have creatinine of 3.66 from baseline of 2.1 vital signs at the time of the admission had elevated blood pressure 153/70, oxygen saturations were 92% on room air, elevated BUN and creatinine of 124/3.66, magnesium 2.5, hemoglobin 8.2.  Acute on chronic kidney injury stage IIIb -Repeat urinalysis, ultrasound of the kidneys to rule out hydronephrosis -Avoid nephrotoxic agents -Hold losartan, Aldactone, Lasix -Continue with gentle hydration, improving 3.66->3.07. -Consult nephrology, concerning about significant proteinuria  Anemia most likely from chronic kidney disease -Get iron profile, B12, folate RBC, reticulocyte count looking into any correctable causes -Hemoglobin trended down from 8.5-> 7.8  Hypertension -Clonidine, Aldactone, losartan, Lasix -Continue with hydralazine, add amlodipine.  COPD -Continue with the breathing treatments as needed.  Depression -Continue the duloxetine  Data Reviewed: Basic Metabolic Panel: Recent Labs  Lab 11/22/23 1843 11/23/23 0010 11/23/23 0440  NA 135  --  137  K 4.7  --  4.1  CL 101  --  105  CO2 24  --  25  GLUCOSE 119*  --  138*  BUN 124*  --  112*  CREATININE 3.66* 3.30* 3.07*  CALCIUM 9.5  --  9.1  MG 2.5*  --   --   PHOS  --   --  4.2   Liver Function Tests: Recent Labs  Lab 11/22/23 1843  11/23/23 0440  AST 15  --   ALT 11  --   ALKPHOS 87  --   BILITOT 0.7  --   PROT 7.4  --   ALBUMIN 3.8 3.3*   No results for input(s): "LIPASE", "AMYLASE" in the last 168 hours. No results for input(s): "AMMONIA" in the last 168 hours. CBC: Recent Labs   Lab 11/22/23 1843 11/23/23 0010 11/23/23 0440  WBC 9.1 8.0 6.8  NEUTROABS 4.7  --   --   HGB 8.5* 8.2* 7.8*  HCT 27.9* 26.6* 25.7*  MCV 61.3* 62.1* 61.0*  PLT 237 216 217   Cardiac Enzymes: No results for input(s): "CKTOTAL", "CKMB", "CKMBINDEX", "TROPONINI" in the last 168 hours. BNP (last 3 results) Recent Labs    09/27/23 1603 10/02/23 0622 10/03/23 0735  BNP 432.7* 225.3* 277.0*    ProBNP (last 3 results) No results for input(s): "PROBNP" in the last 8760 hours.  CBG: No results for input(s): "GLUCAP" in the last 168 hours.  No results found for this or any previous visit (from the past 240 hour(s)).   Studies: No results found.  Scheduled Meds:  cyanocobalamin  1,000 mcg Oral Daily   enoxaparin (LOVENOX) injection  30 mg Subcutaneous Daily   folic acid  1 mg Oral Daily   Continuous Infusions:  Principal Problem:   AKI (acute kidney injury) (HCC)   Time spent: 40 minutes    Kailo Kosik ANP  [facility] 7 am - 330 pm/M-F for direct patient care and secure chat Please refer to Amion for contact info 1  days

## 2023-11-23 NOTE — ED Notes (Signed)
ED TO INPATIENT HANDOFF REPORT  ED Nurse Name and Phone #: Norlene Duel [phone]  S Name/Age/Gender Jeremy Douglas 67 y.o. male Room/Bed: 004C/004C  Code Status   Code Status: Full Code  Home/SNF/Other Home Patient oriented to: self, place, time, and situation Is this baseline? Yes   Triage Complete: Triage complete  Chief Complaint AKI (acute kidney injury) (HCC) [N17.9]  Triage Note Pt arrived POV c/o not feeling well x 3 weeks, tired, lethargic, decreased appetite, went to [place] on Wed and was told to come to ER for elevated BUN and creatinine. Pt w/ hx CKD.   Allergies Allergies  Allergen Reactions   Amlodipine Swelling and Other (See Comments)    Pt cannot tolerate 10mg      Level of Care/Admitting Diagnosis ED Disposition     ED Disposition  Admit   Condition  --   Comment  Hospital Area: [facility] [[id number]]  Level of Care: Telemetry Medical [104]  May admit patient to [facility] or [facility] if equivalent level of care is available:: Yes  Covid Evaluation: Asymptomatic - no recent exposure (last 10 days) testing not required  Diagnosis: AKI (acute kidney injury) [facility]) [[id number]]  Admitting Physician: Darlin Drop [[id number]]  Attending Physician: Darlin Drop [[id number]]  Certification:: I certify this patient will need inpatient services for at least 2 midnights  Expected Medical Readiness: 11/24/2023          B Medical/Surgery History Past Medical History:  Diagnosis Date   Alcohol dependence (HCC) 09/07/2012   Anemia    COPD (chronic obstructive pulmonary disease) (HCC)    Esophagitis 09/07/2012   Per EGD 04/2011   Gastritis 09/07/2012   Per EGD 04/2011   Headache    4 - 5 a yr (no migraines)   Heart murmur    Hypertension    takes Amlodipine,Labetalol,and Clonidine daily   Ischemic chest pain (HCC)    Obesity    OSA treated with BiPAP    Osteoarthritis    "knees" (12/03/2018)   Pulmonary emboli (HCC) 02/2018    Thrombocytopenia (HCC) 09/07/2012   Hx of in past.   Tobacco abuse    Past Surgical History:  Procedure Laterality Date   COLONOSCOPY W/ BIOPSIES AND POLYPECTOMY  2010   "it was negative" (12/03/2018)   JOINT REPLACEMENT     KNEE ARTHROSCOPY Left 09/18/2013   Procedure: ARTHROSCOPY KNEE, PARTIAL MEDIAL AND LATERAL MENISCECTOMY, CHONDROPLASTY PATELLA FEMORAL JOINT;  Surgeon: Harvie Junior, MD;  Location: [facility];  Service: Orthopedics;  Laterality: Left;   KNEE ARTHROSCOPY Right 06/20/2011   TONSILLECTOMY     as a child   TOTAL KNEE ARTHROPLASTY Left 03/19/2014   Procedure: LEFT TOTAL KNEE ARTHROPLASTY;  Surgeon: Harvie Junior, MD;  [facility];  Service: Orthopedics;  Laterality: Left;   TOTAL KNEE ARTHROPLASTY Right 10/15/2014   Procedure: RIGHT TOTAL KNEE ARTHROPLASTY;  Surgeon: Harvie Junior, MD;  Location: [facility];  Service: Orthopedics;  Laterality: Right;   UPPER GI ENDOSCOPY       A IV Location/Drains/Wounds Patient Lines/Drains/Airways Status     Active Line/Drains/Airways     Name Placement date Placement time Site Days   Peripheral IV 11/23/23 20 G Left Forearm 11/23/23  0006  Forearm  less than 1            Intake/Output Last 24 hours No intake or output data in the 24 hours ending 11/23/23 0441  Labs/Imaging Results for orders  placed or performed during the hospital encounter of 11/22/23 (from the past 48 hour(s))  CBC with Differential     Status: Abnormal   Collection Time: 11/22/23  6:43 PM  Result Value Ref Range   WBC 9.1 4.0 - 10.5 K/uL   RBC 4.55 4.22 - 5.81 MIL/uL   Hemoglobin 8.5 (L) 13.0 - 17.0 g/dL    Comment: Reticulocyte Hemoglobin testing may be clinically indicated, consider ordering this additional test [id number]    HCT 27.9 (L) 39.0 - 52.0 %   MCV 61.3 (L) 80.0 - 100.0 fL   MCH 18.7 (L) 26.0 - 34.0 pg   MCHC 30.5 30.0 - 36.0 g/dL   RDW 13.2 44.0 - 10.2 %   Platelets 237 150 - 400 K/uL    Comment: REPEATED TO VERIFY    nRBC 0.0 0.0 - 0.2 %   Neutrophils Relative % 53 %   Neutro Abs 4.7 1.7 - 7.7 K/uL   Lymphocytes Relative 37 %   Lymphs Abs 3.4 0.7 - 4.0 K/uL   Monocytes Relative 8 %   Monocytes Absolute 0.8 0.1 - 1.0 K/uL   Eosinophils Relative 2 %   Eosinophils Absolute 0.2 0.0 - 0.5 K/uL   Basophils Relative 0 %   Basophils Absolute 0.0 0.0 - 0.1 K/uL   Immature Granulocytes 0 %   Abs Immature Granulocytes 0.01 0.00 - 0.07 K/uL    Comment: Performed at [facility], [address] [place], [place] [address]  Comprehensive metabolic panel     Status: Abnormal   Collection Time: 11/22/23  6:43 PM  Result Value Ref Range   Sodium 135 135 - 145 mmol/L   Potassium 4.7 3.5 - 5.1 mmol/L   Chloride 101 98 - 111 mmol/L   CO2 24 22 - 32 mmol/L   Glucose, Bld 119 (H) 70 - 99 mg/dL    Comment: Glucose reference range applies only to samples taken after fasting for at least 8 hours.   BUN 124 (H) 8 - 23 mg/dL   Creatinine, Ser 6.44 (H) 0.61 - 1.24 mg/dL   Calcium 9.5 8.9 - 03.4 mg/dL   Total Protein 7.4 6.5 - 8.1 g/dL   Albumin 3.8 3.5 - 5.0 g/dL   AST 15 15 - 41 U/L   ALT 11 0 - 44 U/L   Alkaline Phosphatase 87 38 - 126 U/L   Total Bilirubin 0.7 <1.2 mg/dL   GFR, Estimated 17 (L) >60 mL/min    Comment: (NOTE) Calculated using the CKD-EPI Creatinine Equation (2021)    Anion gap 10 5 - 15    Comment: Performed at [facility], [address] [place], [place] [address]  Magnesium     Status: Abnormal   Collection Time: 11/22/23  6:43 PM  Result Value Ref Range   Magnesium 2.5 (H) 1.7 - 2.4 mg/dL    Comment: Performed at [facility], [address], [place], [place] [address]  I-Stat CG4 Lactic Acid     Status: None   Collection Time: 11/22/23  6:54 PM  Result Value Ref Range   Lactic Acid, Venous 0.6 0.5 - 1.9 mmol/L  CBC     Status: Abnormal   Collection Time: 11/23/23 12:10 AM  Result Value Ref Range   WBC 8.0 4.0 - 10.5 K/uL   RBC 4.28 4.22 - 5.81 MIL/uL    Hemoglobin 8.2 (L) 13.0 - 17.0 g/dL    Comment: Reticulocyte Hemoglobin testing may be clinically indicated,  consider ordering this additional test [id number]    HCT 26.6 (L) 39.0 - 52.0 %   MCV 62.1 (L) 80.0 - 100.0 fL   MCH 19.2 (L) 26.0 - 34.0 pg   MCHC 30.8 30.0 - 36.0 g/dL   RDW 85.0 27.7 - 41.2 %   Platelets 216 150 - 400 K/uL    Comment: REPEATED TO VERIFY   nRBC 0.0 0.0 - 0.2 %    Comment: Performed at [facility], [address], [place], [place] [address]  Creatinine, serum     Status: Abnormal   Collection Time: 11/23/23 12:10 AM  Result Value Ref Range   Creatinine, Ser 3.30 (H) 0.61 - 1.24 mg/dL   GFR, Estimated 20 (L) >60 mL/min    Comment: (NOTE) Calculated using the CKD-EPI Creatinine Equation (2021) Performed at [facility], [address] [place], [place] [address]    No results found.  Pending Labs Unresulted Labs (From admission, onward)     Start     Ordered   11/23/23 0500  Renal function panel  Tomorrow morning,   R        11/23/23 0032   11/23/23 0500  CBC  Tomorrow morning,   R        11/23/23 0032            Vitals/Pain Today's Vitals   11/23/23 0300 11/23/23 0315 11/23/23 0330 11/23/23 0345  BP: (!) 153/70 138/60 (!) 157/65 (!) 113/56  Pulse: 60 60 65 (!) 55  Resp: 16 13 13 10   Temp:      TempSrc:      SpO2: 92% 92% (!) 89% 90%  Weight:      Height:      PainSc:        Isolation Precautions No active isolations  Medications Medications  enoxaparin (LOVENOX) injection 30 mg (has no administration in time range)  albuterol (PROVENTIL) (2.5 MG/3ML) 0.083% nebulizer solution 2.5 mg (has no administration in time range)  cyanocobalamin (VITAMIN B12) tablet 1,000 mcg (has no administration in time range)  folic acid (FOLVITE) tablet 1 mg (has no administration in time range)  acetaminophen (TYLENOL) tablet 650 mg (has no administration in time range)  prochlorperazine (COMPAZINE) injection 5 mg (has no administration  in time range)  melatonin tablet 5 mg (has no administration in time range)  polyethylene glycol (MIRALAX / GLYCOLAX) packet 17 g (has no administration in time range)  oxyCODONE (Oxy IR/ROXICODONE) immediate release tablet 5 mg (5 mg Oral Given 11/23/23 0142)  0.9 %  sodium chloride infusion ( Intravenous New Bag/Given 11/23/23 0034)    Mobility walks     Focused Assessments Renal Assessment Handoff:     R Recommendations: See Admitting Provider Note  Report given to:   Additional Notes: AKI - BUN 124, Creatinine 3.30 - Receiving maintenance NS

## 2023-11-23 NOTE — Evaluation (Signed)
Physical Therapy Evaluation Patient Details Name: Jeremy Douglas MRN: [id number] DOB: Sep 04, 1956 Today's Date: 11/23/2023  History of Present Illness  67 y.o. male presents to [facility] 11/22/23 w/ AKI. Admit 1 month prior w/ further treatment of rib fractures 3-10 and 4-6 displaced with small associated pneumothorax. PMH: Anemia, COPD, esophagitis, gastritis, heart murmur, HTN, ischemic chest pain, OSA, OA, thrombocytopenia, HFpEF, CKD   Clinical Impression  Prior to admission, pt was independent with no AD. Pt was able to ambulate independently in the hall and ascend/descend 20 steps with supervision for safety. Pt scored 21/24 on DGI and is not at an increased risk for falls. Pt is at functional mobility baseline and has no further acute PT needs. Recommend pt continue with already established HHPT services. Acute PT signing off. Please re-consult with any changes to pt's status.       If plan is discharge home, recommend the following: Help with stairs or ramp for entrance   Can travel by private vehicle    Yes    Equipment Recommendations None recommended by PT     Functional Status Assessment Patient has had a recent decline in their functional status and demonstrates the ability to make significant improvements in function in a reasonable and predictable amount of time.     Precautions / Restrictions Precautions Precautions: None Restrictions Weight Bearing Restrictions: No      Mobility  Bed Mobility Overal bed mobility: Independent     Transfers Overall transfer level: Independent   Ambulation/Gait Ambulation/Gait assistance: Independent Gait Distance (Feet): 400 Feet Assistive device: None Gait Pattern/deviations: Step-through pattern, Shuffle       General Gait Details: limited DF clearance, steady w/ no AD  Stairs Stairs: Yes Stairs assistance: Supervision Stair Management: One rail Left, Step to pattern, Alternating pattern Number of Stairs: 20 General  stair comments: step through pattern on ascent, step to pattern w/ sideways step upon descent         Balance Overall balance assessment: No apparent balance deficits (not formally assessed)  Standardized Balance Assessment Standardized Balance Assessment : Dynamic Gait Index   Dynamic Gait Index Level Surface: Normal Change in Gait Speed: Normal Gait with Horizontal Head Turns: Mild Impairment Gait with Vertical Head Turns: Normal Gait and Pivot Turn: Normal Step Over Obstacle: Mild Impairment Step Around Obstacles: Normal Steps: Mild Impairment Total Score: 21       Pertinent Vitals/Pain Pain Assessment Pain Assessment: No/denies pain    Home Living Family/patient expects to be discharged to:: Private residence Living Arrangements: Alone Available Help at Discharge: Family;Available PRN/intermittently Type of Home: Other(Comment) Home Access: Level entry     Alternate Level Stairs-Number of Steps: 17 Home Layout: Multi-level;Able to live on main level with bedroom/bathroom;Full bath on main level Home Equipment: Rolling Walker (2 wheels);Tub bench (cpap) Additional Comments: Ambulating without AD, PT x2 per week and SLP x1 for cognition, home RN once week helps with medications in pillbox, reports cpap at night with oxygen only    Prior Function Prior Level of Function : Independent/Modified Independent      Extremity/Trunk Assessment   Upper Extremity Assessment Upper Extremity Assessment: Defer to OT evaluation    Lower Extremity Assessment Lower Extremity Assessment: Overall WFL for tasks assessed    Cervical / Trunk Assessment Cervical / Trunk Assessment: Normal  Communication   Communication Communication: No apparent difficulties  Cognition Arousal: Alert Behavior During Therapy: WFL for tasks assessed/performed Overall Cognitive Status: Within Functional Limits for tasks assessed  General Comments General comments (skin integrity, edema,  etc.): VSS on RA     PT Assessment Patient does not need any further PT services   AM-PAC PT "6 Clicks" Mobility  Outcome Measure Help needed turning from your back to your side while in a flat bed without using bedrails?: None Help needed moving from lying on your back to sitting on the side of a flat bed without using bedrails?: None Help needed moving to and from a bed to a chair (including a wheelchair)?: None Help needed standing up from a chair using your arms (e.g., wheelchair or bedside chair)?: None Help needed to walk in hospital room?: None Help needed climbing 3-5 steps with a railing? : A Little 6 Click Score: 23    End of Session   Activity Tolerance: Patient tolerated treatment well Patient left: in chair;with call bell/phone within reach Nurse Communication: Mobility status PT Visit Diagnosis: Unsteadiness on feet (R26.81)    Time: 1610-9604 PT Time Calculation (min) (ACUTE ONLY): 13 min   Charges:   PT Evaluation $PT Eval Low Complexity: 1 Low   PT General Charges $$ ACUTE PT VISIT: 1 Visit       Hilton Cork, PT, DPT Secure Chat Preferred  Rehab Office [phone]   Arturo Morton Brion Aliment 11/23/2023, 10:21 AM

## 2023-11-23 NOTE — ED Notes (Signed)
Temp not  taken because the pt is constantly drinking or eating

## 2023-11-24 DIAGNOSIS — N179 Acute kidney failure, unspecified: Secondary | ICD-10-CM | POA: Diagnosis not present

## 2023-11-24 DIAGNOSIS — I10 Essential (primary) hypertension: Secondary | ICD-10-CM

## 2023-11-24 LAB — BASIC METABOLIC PANEL
Anion gap: 11 (ref 5–15)
BUN: 66 mg/dL — ABNORMAL HIGH (ref 8–23)
CO2: 23 mmol/L (ref 22–32)
Calcium: 9.9 mg/dL (ref 8.9–10.3)
Chloride: 105 mmol/L (ref 98–111)
Creatinine, Ser: 1.95 mg/dL — ABNORMAL HIGH (ref 0.61–1.24)
GFR, Estimated: 37 mL/min — ABNORMAL LOW (ref >60)
Glucose, Bld: 124 mg/dL — ABNORMAL HIGH (ref 70–99)
Potassium: 4.4 mmol/L (ref 3.5–5.1)
Sodium: 139 mmol/L (ref 135–145)

## 2023-11-24 MED ORDER — AMLODIPINE BESYLATE 5 MG PO TABS
5.0000 mg | ORAL_TABLET | Freq: Every day | ORAL | Status: DC
  Administered 2023-11-24: 5 mg via ORAL
  Filled 2023-11-24: qty 1

## 2023-11-24 MED ORDER — CLONIDINE HCL 0.1 MG PO TABS
0.2000 mg | ORAL_TABLET | Freq: Two times a day (BID) | ORAL | Status: DC
  Administered 2023-11-24: 0.2 mg via ORAL
  Filled 2023-11-24: qty 2

## 2023-11-24 MED ORDER — DULOXETINE HCL 60 MG PO CPEP
60.0000 mg | ORAL_CAPSULE | Freq: Every day | ORAL | Status: DC
  Administered 2023-11-24: 60 mg via ORAL
  Filled 2023-11-24: qty 1

## 2023-11-24 MED ORDER — FINASTERIDE 5 MG PO TABS
5.0000 mg | ORAL_TABLET | Freq: Every day | ORAL | Status: DC
  Administered 2023-11-24: 5 mg via ORAL
  Filled 2023-11-24: qty 1

## 2023-11-24 MED ORDER — HYDRALAZINE HCL 50 MG PO TABS
100.0000 mg | ORAL_TABLET | Freq: Three times a day (TID) | ORAL | Status: DC
Start: 2023-11-24 — End: 2023-11-24
  Administered 2023-11-24 (×2): 100 mg via ORAL
  Filled 2023-11-24 (×2): qty 2

## 2023-11-24 MED ORDER — HYDRALAZINE HCL 20 MG/ML IJ SOLN
10.0000 mg | Freq: Four times a day (QID) | INTRAMUSCULAR | Status: DC | PRN
  Administered 2023-11-24: 10 mg via INTRAVENOUS
  Filled 2023-11-24: qty 1

## 2023-11-24 MED ORDER — FUROSEMIDE 80 MG PO TABS: 40.0000 mg | ORAL_TABLET | Freq: Two times a day (BID) | ORAL | Status: AC

## 2023-11-24 NOTE — Plan of Care (Signed)
  Problem: Education: Goal: Knowledge of General Education information will improve Description: Including pain rating scale, medication(s)/side effects and non-pharmacologic comfort measures Outcome: Progressing   Problem: Pain Management: Goal: General experience of comfort will improve Outcome: Progressing   Problem: Safety: Goal: Ability to remain free from injury will improve Outcome: Progressing   Problem: Skin Integrity: Goal: Risk for impaired skin integrity will decrease Outcome: Progressing

## 2023-11-24 NOTE — Progress Notes (Signed)
[facility] PROGRESS NOTE    Progress Note  Jeremy Douglas  ZOX:[id number] DOB: 08-08-1956 DOA: 11/22/2023 [facility]: [facility]     Brief Narrative:   Jeremy Douglas is an 66 y.o. male past medical history significant for essential hypertension, chronic diastolic heart failure, COPD, chronic kidney disease stage IIIb referred by the [facility] for rising creatinine to 3.6 (her baseline is lower around 2) has remained afebrile blood pressure stable   Assessment/Plan:   AKI (acute kidney injury) (HCC) on chronic kidney see stage IIIb: Baseline creatinine like around 2 on admission 3.6. Started on IV fluids creatinine this morning is 3.0.  Continue IV fluids for an additional 24 hours. Hold ACE inhibitor and diuretic therapy. Strict I's and O's and daily weights. Continue IV fluids recheck basic metabolic panel tomorrow morning.  If creatinine better can be discharged.  Essential hypertension: BP not at goal hold ACE inhibitor and diuretics. Restart clonidine and hydralazine as blood pressures greater than 160.  Microcytic anemia Hemoglobin usually runs 8-9, this morning is 7.8, I am sure it will drop after continue IV fluid hydration recheck a basic metabolic panel in the morning have a low threshold to transfuse if symptomatic or less than 7.  Chronic congestive heart failure with left ventricular diastolic dysfunction (HCC) Last 2D echo in 2023 showed an EF of 60%, grade 2 diastolic dysfunction.  Generalized fatigue: PT OT has been consulted.  DVT prophylaxis: lovenox Family Communication:none Status is: Observation The patient remains OBS appropriate and will d/c before 2 midnights.    Code Status:     Code Status Orders  (From admission, onward)           Start     Ordered   11/22/23 2314  Full code  Continuous       Question:  By:  Answer:  Consent: discussion documented in EHR   11/22/23 2313           Code Status History     Date Active  Date Inactive Code Status Order ID Comments User Context   09/24/2023 2356 10/04/2023 1811 Full Code [id number]  Stechschulte, Hyman Hopes, MD ED   03/29/2023 0233 03/30/2023 1505 Full Code [id number]  Marlou Sa, NP ED   03/28/2023 1705 03/28/2023 2053 Full Code [id number]  Bing Neighbors, NP ED   07/09/2022 2349 07/14/2022 0047 Full Code [id number]  Shalhoub, Deno Lunger, MD ED   12/03/2018 1216 12/06/2018 1442 DNR [id number]  Jonah Blue, MD ED   03/14/2018 2321 03/15/2018 2109 DNR [id number]  Kathrynn Running, MD Inpatient   10/15/2014 1216 10/17/2014 1610 Full Code [id number]  Matthew Folks, PA-C Inpatient   03/19/2014 1500 03/21/2014 1541 Full Code [id number]  Lona Kettle Inpatient   09/07/2012 2242 09/11/2012 0303 Full Code [id number]  Dwyane Luo, RN Inpatient      Advance Directive Documentation    Flowsheet Row Most Recent Value  Type of Advance Directive Healthcare Power of Attorney, Living will  Pre-existing out of facility DNR order (yellow form or pink MOST form) --  "MOST" Form in Place? --         IV Access:   Peripheral IV   Procedures and diagnostic studies:   US RENAL  Result Date: 11/23/2023 CLINICAL DATA:  Acute renal insufficiency EXAM: RENAL / URINARY TRACT ULTRASOUND COMPLETE COMPARISON:  09/24/2023, 07/10/2022 FINDINGS: Right Kidney: Renal measurements: 11.5 x 5.1 x 5.3 cm = volume: 161.0 mL. Echogenicity within normal limits. No  hydronephrosis or nephrolithiasis. Stable simple 3.3 x 2.8 x 3.0 cm cyst within the lower pole does not require imaging follow-up. Left Kidney: Renal measurements: 11.1 x 5.5 by 5.8 cm = volume: 187.7 mL. Echogenicity within normal limits. No mass or hydronephrosis visualized. Bladder: Appears normal for degree of bladder distention. Other: None. IMPRESSION: 1. Unremarkable renal ultrasound. Electronically Signed   By: Sharlet Salina M.D.   On: 11/23/2023 12:50     Medical Consultants:   None.   Subjective:     Jeremy Douglas complaints feels great wants to get out of here he is hungry.  Objective:    Vitals:   11/23/23 0500 11/23/23 0903 11/23/23 2017 11/24/23 0431  BP:  133/70 (!) 164/74 (!) 197/74  Pulse:  (!) 59 (!) 58 65  Resp:  16 17 16   Temp:  98.2 F (36.8 C) 98.5 F (36.9 C) 98.5 F (36.9 C)  TempSrc:  Oral Oral Oral  SpO2:  96% 95% 96%  Weight: 88.4 kg   89.4 kg  Height:       SpO2: 96 %   Intake/Output Summary (Last 24 hours) at 11/24/2023 0746 Last data filed at 11/24/2023 0500 Gross per 24 hour  Intake --  Output 0 ml  Net 0 ml   Filed Weights   11/22/23 1840 11/23/23 0500 11/24/23 0431  Weight: 83.9 kg 88.4 kg 89.4 kg    Exam: General exam: In no acute distress. Respiratory system: Good air movement and clear to auscultation. Cardiovascular system: S1 & S2 heard, RRR. No JVD. Gastrointestinal system: Abdomen is nondistended, soft and nontender.  Extremities: No pedal edema. Skin: No rashes, lesions or ulcers Psychiatry: Judgement and insight appear normal. Mood & affect appropriate.    Data Reviewed:    Labs: Basic Metabolic Panel: Recent Labs  Lab 11/22/23 1843 11/23/23 0010 11/23/23 0440  NA 135  --  137  K 4.7  --  4.1  CL 101  --  105  CO2 24  --  25  GLUCOSE 119*  --  138*  BUN 124*  --  112*  CREATININE 3.66* 3.30* 3.07*  CALCIUM 9.5  --  9.1  MG 2.5*  --   --   PHOS  --   --  4.2   GFR Estimated Creatinine Clearance: 25.2 mL/min (A) (by C-G formula based on SCr of 3.07 mg/dL (H)). Liver Function Tests: Recent Labs  Lab 11/22/23 1843 11/23/23 0440  AST 15  --   ALT 11  --   ALKPHOS 87  --   BILITOT 0.7  --   PROT 7.4  --   ALBUMIN 3.8 3.3*   No results for input(s): "LIPASE", "AMYLASE" in the last 168 hours. No results for input(s): "AMMONIA" in the last 168 hours. Coagulation profile No results for input(s): "INR", "PROTIME" in the last 168 hours. COVID-19 Labs  No results for input(s): "DDIMER", "FERRITIN",  "LDH", "CRP" in the last 72 hours.  Lab Results  Component Value Date   SARSCOV2NAA NEGATIVE 03/28/2023   SARSCOV2NAA NEGATIVE 07/09/2022    CBC: Recent Labs  Lab 11/22/23 1843 11/23/23 0010 11/23/23 0440  WBC 9.1 8.0 6.8  NEUTROABS 4.7  --   --   HGB 8.5* 8.2* 7.8*  HCT 27.9* 26.6* 25.7*  MCV 61.3* 62.1* 61.0*  PLT 237 216 217   Cardiac Enzymes: No results for input(s): "CKTOTAL", "CKMB", "CKMBINDEX", "TROPONINI" in the last 168 hours. BNP (last 3 results) No results for input(s): "PROBNP" in the  last 8760 hours. CBG: No results for input(s): "GLUCAP" in the last 168 hours. D-Dimer: No results for input(s): "DDIMER" in the last 72 hours. Hgb A1c: No results for input(s): "HGBA1C" in the last 72 hours. Lipid Profile: No results for input(s): "CHOL", "HDL", "LDLCALC", "TRIG", "CHOLHDL", "LDLDIRECT" in the last 72 hours. Thyroid function studies: No results for input(s): "TSH", "T4TOTAL", "T3FREE", "THYROIDAB" in the last 72 hours.  Invalid input(s): "FREET3" Anemia work up: Recent Labs    11/23/23 0440  VITAMINB12 845  TIBC 312  IRON 112  RETICCTPCT 0.7   Sepsis Labs: Recent Labs  Lab 11/22/23 1843 11/22/23 1854 11/23/23 0010 11/23/23 0440  WBC 9.1  --  8.0 6.8  LATICACIDVEN  --  0.6  --   --    Microbiology No results found for this or any previous visit (from the past 240 hour(s)).   Medications:    cyanocobalamin  1,000 mcg Oral Daily   enoxaparin (LOVENOX) injection  30 mg Subcutaneous Daily   folic acid  1 mg Oral Daily   Continuous Infusions:    LOS: 1 day   Marinda Elk  [facility]  11/24/2023, 7:46 AM

## 2023-11-24 NOTE — Progress Notes (Signed)
Patient's bp checked. 188/90. Scheduled norvasc and hydralazine given.

## 2023-11-24 NOTE — TOC Transition Note (Addendum)
Transition of Care [facility]) - CM/SW Discharge Note   Patient Details  Name: Jeremy Douglas MRN: [id number] Date of Birth: Jul 17, 1956  [facility] of [facility] CM/SW Contact:  Ronny Bacon, RN Phone Number: 11/24/2023, 11:07 AM   Clinical Narrative:   Patient is being discharged today. Message to [facility] with [facility] to see if patient is current with their services for home health. Awaiting response.  1101: Cory with [facility] responded back and confirms that patient is under their services and aware of him being discharged today.    Final next level of care: Home w Home Health Services Barriers to Discharge: No Barriers Identified   Patient Goals and CMS Choice      Discharge Placement                         Discharge Plan and Services Additional resources added to the After Visit Summary for                                       Social Determinants of Health (SDOH) Interventions SDOH Screenings   Food Insecurity: No Food Insecurity (11/23/2023)  Housing: Low Risk  (11/23/2023)  Transportation Needs: No Transportation Needs (11/23/2023)  Utilities: Not At Risk (11/23/2023)  Depression (PHQ2-9): Medium Risk (03/29/2023)  Tobacco Use: High Risk (09/24/2023)     Readmission Risk Interventions    07/13/2022   12:20 PM  Readmission Risk Prevention Plan  Transportation Screening Complete  PCP or Specialist Appt within 3-5 Days Complete  HRI or Home Care Consult Complete  Social Work Consult for Recovery Care Planning/Counseling Complete  Palliative Care Screening Not Applicable  Medication Review Oceanographer) Referral to Pharmacy

## 2023-11-24 NOTE — Discharge Summary (Signed)
Physician Discharge Summary  PHILIPPE POSTON UXL:[id number] DOB: 01-29-1956 DOA: 11/22/2023  [facility]P: [facility]  Admit date: 11/22/2023 Discharge date: 11/24/2023  Admitted From: Home Disposition:  Home  Recommendations for Outpatient Follow-up:  Follow up with PCP in 1-2 weeks Please obtain BMP/CBC in one week   Home Health:No Equipment/Devices:None  Discharge Condition:Stable CODE STATUS:Full Diet recommendation: Heart Healthy   Brief/Interim Summary: 67 y.o. male past medical history significant for essential hypertension, chronic diastolic heart failure, COPD, chronic kidney disease stage IIIb referred by the [facility] for rising creatinine to 3.6 (her baseline is lower around 2) has remained afebrile blood pressure stable   Discharge Diagnoses:  Principal Problem:   AKI (acute kidney injury) (HCC) Active Problems:   Essential hypertension   Microcytic anemia   Chronic congestive heart failure with left ventricular diastolic dysfunction (HCC)  Acute kidney injury on chronic disease stage IIIb: With a baseline creatinine around 2, and admission 3.6. ACE inhibitor and diuretic therapy were held he was started on IV fluids, this morning is 1.9. He will resume diuretic and ACE inhibitor as an outpatient.  Essential hypertension: Blood pressure not at goal due to acute kidney injury ACE inhibitor and diuretic therapy were held. Hydralazine clonidine has been resumed. He will resume his antihypertensive medication as an outpatient.  Microcytic anemia: Hemoglobin has remained low since he is stable follow-up PCP as an outpatient.  Chronic diastolic dysfunction: No changes made to his medication.  Generalized fatigue: PT OT was consulted recommended home health PT.      Discharge Instructions  Discharge Instructions     Diet - low sodium heart healthy   Complete by: As directed    Increase activity slowly   Complete by: As directed       Allergies as  of 11/24/2023       Reactions   Amlodipine Swelling, Other (See Comments)   Pt cannot tolerate 10mg         Medication List     STOP taking these medications    VITAMIN B-12 PO       TAKE these medications    albuterol 108 (90 Base) MCG/ACT inhaler Commonly known as: VENTOLIN HFA Inhale 2 puffs into the lungs every 6 (six) hours as needed for wheezing or shortness of breath.   calcitRIOL 0.25 MCG capsule Commonly known as: ROCALTROL Take 0.25 mcg by mouth 3 (three) times a week.   cloNIDine 0.2 MG tablet Commonly known as: CATAPRES Take 0.2 mg by mouth 2 (two) times daily. Hold for heart rate less than 60 or systolic blood pressure less than 110.   cyclobenzaprine 10 MG tablet Commonly known as: FLEXERIL Take 10 mg by mouth 3 (three) times daily.   DULoxetine 30 MG capsule Commonly known as: CYMBALTA Take 60 mg by mouth daily.   ferrous sulfate 325 (65 FE) MG EC tablet Take 325 mg by mouth daily with breakfast.   finasteride 5 MG tablet Commonly known as: PROSCAR Take 5 mg by mouth daily.   folic acid 1 MG tablet Commonly known as: FOLVITE Take 1 mg by mouth daily.   furosemide 80 MG tablet Commonly known as: LASIX Take 40 mg by mouth 2 (two) times daily. Take 1 tablet by mouth in the morning and 1/2 tablet in the evening   hydrALAZINE 100 MG tablet Commonly known as: APRESOLINE Take 1 tablet (100 mg total) by mouth 3 (three) times daily.   losartan 50 MG tablet Commonly known as: COZAAR Take 25  mg by mouth daily.   MAGNESIUM PO Take 1 tablet by mouth daily.   naloxone 4 MG/0.1ML Liqd nasal spray kit Commonly known as: NARCAN Place 1 spray into the nose once as needed (For opioid overdose).   oxyCODONE 15 MG immediate release tablet Commonly known as: ROXICODONE Take 15 mg by mouth every 4 (four) hours as needed for pain.   spironolactone 25 MG tablet Commonly known as: ALDACTONE Take 25 mg by mouth daily.   VITAMIN D-3 PO Take 1 tablet by  mouth daily.        Allergies  Allergen Reactions   Amlodipine Swelling and Other (See Comments)    Pt cannot tolerate 10mg      Consultations: None   Procedures/Studies: US RENAL  Result Date: 11/23/2023 CLINICAL DATA:  Acute renal insufficiency EXAM: RENAL / URINARY TRACT ULTRASOUND COMPLETE COMPARISON:  09/24/2023, 07/10/2022 FINDINGS: Right Kidney: Renal measurements: 11.5 x 5.1 x 5.3 cm = volume: 161.0 mL. Echogenicity within normal limits. No hydronephrosis or nephrolithiasis. Stable simple 3.3 x 2.8 x 3.0 cm cyst within the lower pole does not require imaging follow-up. Left Kidney: Renal measurements: 11.1 x 5.5 by 5.8 cm = volume: 187.7 mL. Echogenicity within normal limits. No mass or hydronephrosis visualized. Bladder: Appears normal for degree of bladder distention. Other: None. IMPRESSION: 1. Unremarkable renal ultrasound. Electronically Signed   By: Sharlet Salina M.D.   On: 11/23/2023 12:50   (Echo, Carotid, EGD, Colonoscopy, ERCP)    Subjective: No complaints  Discharge Exam: Vitals:   11/24/23 0723 11/24/23 0832  BP:  (!) 197/92  Pulse:  87  Resp:  17  Temp:  98.4 F (36.9 C)  SpO2: 97% 96%   Vitals:   11/23/23 2017 11/24/23 0431 11/24/23 0723 11/24/23 0832  BP: (!) 164/74 (!) 197/74  (!) 197/92  Pulse: (!) 58 65  87  Resp: 17 16  17   Temp: 98.5 F (36.9 C) 98.5 F (36.9 C)  98.4 F (36.9 C)  TempSrc: Oral Oral  Oral  SpO2: 95% 96% 97% 96%  Weight:  89.4 kg    Height:        General: Pt is alert, awake, not in acute distress Cardiovascular: RRR, S1/S2 +, no rubs, no gallops Respiratory: CTA bilaterally, no wheezing, no rhonchi Abdominal: Soft, NT, ND, bowel sounds + Extremities: no edema, no cyanosis    The results of significant diagnostics from this hospitalization (including imaging, microbiology, ancillary and laboratory) are listed below for reference.     Microbiology: No results found for this or any previous visit (from the past  240 hour(s)).   Labs: BNP (last 3 results) Recent Labs    09/27/23 1603 10/02/23 0622 10/03/23 0735  BNP 432.7* 225.3* 277.0*   Basic Metabolic Panel: Recent Labs  Lab 11/22/23 1843 11/23/23 0010 11/23/23 0440 11/24/23 0938  NA 135  --  137 139  K 4.7  --  4.1 4.4  CL 101  --  105 105  CO2 24  --  25 23  GLUCOSE 119*  --  138* 124*  BUN 124*  --  112* 66*  CREATININE 3.66* 3.30* 3.07* 1.95*  CALCIUM 9.5  --  9.1 9.9  MG 2.5*  --   --   --   PHOS  --   --  4.2  --    Liver Function Tests: Recent Labs  Lab 11/22/23 1843 11/23/23 0440  AST 15  --   ALT 11  --   ALKPHOS 87  --  BILITOT 0.7  --   PROT 7.4  --   ALBUMIN 3.8 3.3*   No results for input(s): "LIPASE", "AMYLASE" in the last 168 hours. No results for input(s): "AMMONIA" in the last 168 hours. CBC: Recent Labs  Lab 11/22/23 1843 11/23/23 0010 11/23/23 0440  WBC 9.1 8.0 6.8  NEUTROABS 4.7  --   --   HGB 8.5* 8.2* 7.8*  HCT 27.9* 26.6* 25.7*  MCV 61.3* 62.1* 61.0*  PLT 237 216 217   Cardiac Enzymes: No results for input(s): "CKTOTAL", "CKMB", "CKMBINDEX", "TROPONINI" in the last 168 hours. BNP: Invalid input(s): "POCBNP" CBG: No results for input(s): "GLUCAP" in the last 168 hours. D-Dimer No results for input(s): "DDIMER" in the last 72 hours. Hgb A1c No results for input(s): "HGBA1C" in the last 72 hours. Lipid Profile No results for input(s): "CHOL", "HDL", "LDLCALC", "TRIG", "CHOLHDL", "LDLDIRECT" in the last 72 hours. Thyroid function studies No results for input(s): "TSH", "T4TOTAL", "T3FREE", "THYROIDAB" in the last 72 hours.  Invalid input(s): "FREET3" Anemia work up Recent Labs    11/23/23 0440  VITAMINB12 845  TIBC 312  IRON 112  RETICCTPCT 0.7   Urinalysis    Component Value Date/Time   COLORURINE YELLOW 11/23/2023 1800   APPEARANCEUR CLEAR 11/23/2023 1800   LABSPEC 1.012 11/23/2023 1800   PHURINE 7.0 11/23/2023 1800   GLUCOSEU NEGATIVE 11/23/2023 1800   HGBUR  NEGATIVE 11/23/2023 1800   BILIRUBINUR NEGATIVE 11/23/2023 1800   KETONESUR NEGATIVE 11/23/2023 1800   PROTEINUR 100 (A) 11/23/2023 1800   UROBILINOGEN 0.2 10/12/2014 1332   NITRITE NEGATIVE 11/23/2023 1800   LEUKOCYTESUR NEGATIVE 11/23/2023 1800   Sepsis Labs Recent Labs  Lab 11/22/23 1843 11/23/23 0010 11/23/23 0440  WBC 9.1 8.0 6.8   Microbiology No results found for this or any previous visit (from the past 240 hour(s)).   Time coordinating discharge: Over 35 minutes  SIGNED:   Marinda Elk, MD  [facility] 11/24/2023, 10:51 AM Pager   If 7PM-7AM, please contact night-coverage [email or web address] Password [email or web address]

## 2024-04-22 ENCOUNTER — Other Ambulatory Visit: Payer: Self-pay

## 2024-06-30 ENCOUNTER — Other Ambulatory Visit (HOSPITAL_COMMUNITY): Payer: Self-pay

## 2024-09-22 DIAGNOSIS — I083 Combined rheumatic disorders of mitral, aortic and tricuspid valves: Secondary | ICD-10-CM | POA: Diagnosis not present

## 2024-09-24 NOTE — Discharge Summary (Signed)
 [employer] [facility] Discharge Summary  PCP: No primary care provider on file. Discharge Details   Admit date:         09/21/2024 Discharge date:        09/24/2024  Hospital LOS:    2 days DIscharge Disposition:  Home  Active Hospital Problems   Diagnosis Date Noted POA  . *Facial weakness 09/21/2024 Yes  . Hypertensive emergency 09/21/2024 Unknown  . Stroke-like symptom 09/21/2024 Unknown  . Resistant hypertension 09/21/2024 Unknown  . BMI 27.0-27.9,adult 09/21/2024 Not Applicable    Resolved Hospital Problems  No resolved problems to display.      Current Discharge Medication List     START taking these medications      Details  aspirin  81 mg chewable tablet Start taking on: September 25, 2024  Chew one tablet (81 mg dose) by mouth daily. Quantity: 30 tablet   atorvastatin  40 mg tablet Commonly known as: LIPITOR  Take one tablet (40 mg dose) by mouth at bedtime. Quantity: 30 tablet   nicotine  14 mg/24 hours Commonly known as: HABITROL ,NICODERM CQ  Start taking on: September 25, 2024  Place one patch onto the skin daily. Quantity: 30 patch   nicotine  polacrilex 2 MG lozenge Commonly known as: COMMIT  Place one lozenge (2 mg dose) inside cheek as needed for Smoking cessation. Quantity: 360 lozenge   predniSONE  20 mg tablet Commonly known as: DELTASONE  Start taking on: September 25, 2024  Take three tablets (60 mg dose) by mouth daily for 6 days, THEN two and a half tablets (50 mg dose) daily for 3 days, THEN two tablets (40 mg dose) daily for 3 days, THEN one and a half tablets (30 mg dose) daily for 3 days, THEN one tablet (20 mg dose) daily for 3 days, THEN one half tablet (10 mg dose) daily for 3 days. Quantity: 41 tablet   valacyclovir 1000 mg tablet Commonly known as: VALTREX  Take one tablet (1,000 mg dose) by mouth 3 (three) times a day for 6 days. Indication: Shingles Quantity: 18 tablet       CONTINUE these medications which have  CHANGED      Details  clonidine  0.3 mg tablet Commonly known as: CATAPRES  What changed:  medication strength how much to take additional instructions  Take one tablet (0.3 mg dose) by mouth 2 (two) times daily. Quantity: 60 tablet       CONTINUE these medications which have NOT CHANGED      Details  budesonide -formoterol  160-4.5 mcg/actuation inhaler Commonly known as: SYMBICORT   Inhale two puffs into the lungs 2 (two) times daily.   cyclobenzaprine  10 mg tablet Commonly known as: FLEXERIL   Take one tablet (10 mg dose) by mouth 3 (three) times a day as needed for Muscle spasms. Indication: Muscle Spasm   empagliflozin 25 mg Tabs tablet Commonly known as: JARDIANCE  Take one half tablet (12.5 mg dose) by mouth daily. Take one-half tablet by mouth every morning   epoetin alfa-epbx 4000 Units/mL injection Commonly known as: RETACRIT  Inject 1 mL (4,000 Units dose) into the skin every 14 (fourteen) days.   finasteride  5 mg tablet Commonly known as: PROSCAR   Take one tablet (5 mg dose) by mouth daily. Indication: Benign Enlargement of Prostate   furosemide  40 mg tablet Commonly known as: LASIX   Take one tablet (40 mg dose) by mouth daily.   hydrALAZINE  HCl 100 mg tablet Commonly known as: APRESOLINE   Take one tablet (100 mg dose) by mouth 3 (  three) times a day.   losartan  potassium 50 mg tablet Commonly known as: COZAAR   Take one half tablet (25 mg dose) by mouth daily. FOR BLOOD PRESSURE Quantity: 30 tablet   naloxone nasal spray (TAKE HOME PACK) Commonly known as: NARCAN  one spray by Intranasal route as needed for Opioid Reversal.   oxyCODONE  HCl 15 mg immediate release tablet Commonly known as: ROXICODONE   Take one tablet (15 mg dose) by mouth every 4 (four) hours as needed for Pain.   traZODone  150 MG tablet Commonly known as: DESYREL   Take one tablet (150 mg dose) by mouth at bedtime as needed for Sleep.      * You might also be taking other  medications not listed above. If you have questions about any of your other medications, talk to the person who prescribed them or your Primary Care Provider.           Reason for Medication Changes:  Hospital Course  Physicians involved in care during this hospitalization Attending Provider: Therisa KANDICE Silvan, MD Attending Provider: Deedee Flatten, MD Attending Provider: Arlin Minion, MD Admitting Provider: Deedee Flatten, MD Consulting Physician: [employer] [facility] Consulting Physician: Cathlean FORBES Lanius, MD Consulting Physician: Herlene Prentice Amen, MD  Indication for Admission: Right facial droop, Hypertensive emergency    68 year old male with past medical history of carotid artery stenosis, renal artery stenosis, uncontrolled hypertension presented to our center with chief complaint of right-sided facial drooping and weakness For 4 days.  Patient also reported decreased hearing over his right ear since 1 week duration.  CTA head and neck were performed and showed no acute intracranial abnormalities.  Severe stenosis of unknown the V4 segment of left vertebral artery, near the origin and 50% stenosis along the paraclinoid segment of the right ICA,  50% stenosis near the origin of left ICA, Intra and extracranial atherosclerotic disease.  MRI head negative.   Patient had hypertensive crisis with blood pressure of systolic in 210. He was treated with nicardipine  drip, IV as needed antihypertensives with oral adjustment of antihypertensives.  He also had AKI on CKD along with right renal artery stenosis.  He takes losartan  50 mg at home, given AKI on CKD his losartan  dosing was decreased at 25.  Since patient has renal artery stenosis, he needs medication which can work on RAAS system so losartan  was continued but did not need reduced dose given AKI.  His clonidine  was increased to 0.3 twice daily, hydralazine  100mg  3 times daily, patient reports missing his  antihypertensives at times.  Neurology was consulted, patient was treated for Bell's palsy with oral prednisone  60 mg for 7 days and tapering after that, oral valacyclovir 1000 mg 3 times daily for 7 days for adjunctive coverage for viral infection.  Patient reports that he already has established vascular surgery Dr. Beryl at [facility] for urgent follow-up has been made. Also outpatient urgent follow-up request for ENT has been made.  Extensive counseling for smoking cessation and the risk for stroke has been done.  Nicotine  patch and nicotine  lozenges has been prescribed.  Aspirin  has been added as per neurology recommendation.  He is getting discharged with adjustment of his antihypertensive medication, nicotine  patches and lozenges, aspirin , prednisone  with tapering dose and valacyclovir.  At the time of discharge, patient is doing good, vitals are stable, home medications have been resumed and reconciled.  Patient will have to call to schedule an appointment and follow up with PCP  within 1 week of discharge.Repeat BMP at PCP clinic.  Please call to schedule an appointment and follow up with Vascular surgery and ENT.  Please Call [phone] incase of emergency and seek medical advice immediately in case of worsening of symptoms.  Bedside Procedures   No orders found     [facility]   Discharge Procedure Orders  Ambulatory referral to Vascular Surgery  Standing Status: Future  Referral Priority: Urgent Referral Type: Surgical  Referral Reason: Evaluate and Return  Requested Specialty: Vascular Surgery  Number of Visits Requested: 1 Expiration Date: 03/21/25   Ambulatory referral to ENT  Standing Status: Future  Referral Priority: Urgent Referral Type: Consultation  Referral Reason: Evaluate and Return  Requested Specialty: Otolaryngology  Number of Visits Requested: 1 Expiration Date: 03/21/25   Follow-up with Primary Care Physician  Standing Status: Future   Referral Priority: Routine Referral Type: Consultation  Referral Reason: Evaluate and Return  Number of Visits Requested: 1 Expiration Date: 03/22/25   Ambulatory referral to Neurology  Standing Status: Future  Referral Priority: Routine Referral Type: Consultation  Referral Reason: Evaluate and Return  Number of Visits Requested: 1 Expiration Date: 03/22/25   Regular Diet (2 gm sodium restriction)   Activity as tolerated   Discharge instructions  Order Comments: Please watch your salt intake.  Monitor blood pressure regularly at home.  Follow-up with vascular surgery, ENT, neurology as outpatient basis.  Seek medical attention in case of worsening of symptoms.   Since you wanted to be discharged early and reported that you will monitor blood pressure at home, you are getting discharged with oral antihypertensives.  Repeat your BMP with primary care physician within 5 to 7 days of discharge to keep an eye on your creatinine function.    Unresulted [employer] Current Status   Aldosterone/Renin Ratio SO In process   Renin Activity SO In process           Code Status:   Full Code   Time spent in discharge process:  35 minutes  This note was dictated with voice recognition software. Similar sounding words can inadvertently be transcribed and may not be corrected upon review  Electronically signed: Arlin Minion, MD 09/24/2024 / 10:36 AM  *Some images could not be shown.
